# Patient Record
Sex: Female | Born: 1935 | Race: White | Hispanic: No | Smoking: Former smoker
Health system: Southern US, Community
[De-identification: ages and names within clinical notes are randomized; demographics above are authoritative.]

## PROBLEM LIST (undated history)

## (undated) DIAGNOSIS — I5189 Other ill-defined heart diseases: Secondary | ICD-10-CM

## (undated) DIAGNOSIS — M48061 Spinal stenosis, lumbar region without neurogenic claudication: Secondary | ICD-10-CM

## (undated) DIAGNOSIS — R42 Dizziness and giddiness: Secondary | ICD-10-CM

## (undated) DIAGNOSIS — I493 Ventricular premature depolarization: Secondary | ICD-10-CM

## (undated) DIAGNOSIS — D497 Neoplasm of unspecified behavior of endocrine glands and other parts of nervous system: Secondary | ICD-10-CM

## (undated) DIAGNOSIS — Z974 Presence of external hearing-aid: Secondary | ICD-10-CM

## (undated) DIAGNOSIS — K5792 Diverticulitis of intestine, part unspecified, without perforation or abscess without bleeding: Secondary | ICD-10-CM

## (undated) DIAGNOSIS — I498 Other specified cardiac arrhythmias: Secondary | ICD-10-CM

## (undated) DIAGNOSIS — N3020 Other chronic cystitis without hematuria: Secondary | ICD-10-CM

## (undated) DIAGNOSIS — M1990 Unspecified osteoarthritis, unspecified site: Secondary | ICD-10-CM

## (undated) DIAGNOSIS — E119 Type 2 diabetes mellitus without complications: Secondary | ICD-10-CM

## (undated) DIAGNOSIS — K625 Hemorrhage of anus and rectum: Secondary | ICD-10-CM

## (undated) DIAGNOSIS — S065XAA Traumatic subdural hemorrhage with loss of consciousness status unknown, initial encounter: Secondary | ICD-10-CM

## (undated) DIAGNOSIS — S065X9A Traumatic subdural hemorrhage with loss of consciousness of unspecified duration, initial encounter: Secondary | ICD-10-CM

## (undated) DIAGNOSIS — I739 Peripheral vascular disease, unspecified: Secondary | ICD-10-CM

## (undated) DIAGNOSIS — I639 Cerebral infarction, unspecified: Secondary | ICD-10-CM

## (undated) DIAGNOSIS — E785 Hyperlipidemia, unspecified: Secondary | ICD-10-CM

## (undated) DIAGNOSIS — I10 Essential (primary) hypertension: Secondary | ICD-10-CM

## (undated) DIAGNOSIS — E041 Nontoxic single thyroid nodule: Secondary | ICD-10-CM

## (undated) DIAGNOSIS — R55 Syncope and collapse: Secondary | ICD-10-CM

## (undated) DIAGNOSIS — E079 Disorder of thyroid, unspecified: Secondary | ICD-10-CM

## (undated) DIAGNOSIS — C4491 Basal cell carcinoma of skin, unspecified: Secondary | ICD-10-CM

## (undated) DIAGNOSIS — I471 Supraventricular tachycardia, unspecified: Secondary | ICD-10-CM

## (undated) DIAGNOSIS — M79606 Pain in leg, unspecified: Secondary | ICD-10-CM

## (undated) DIAGNOSIS — D510 Vitamin B12 deficiency anemia due to intrinsic factor deficiency: Secondary | ICD-10-CM

## (undated) HISTORY — DX: Vitamin B12 deficiency anemia due to intrinsic factor deficiency: D51.0

## (undated) HISTORY — DX: Cerebral infarction, unspecified: I63.9

## (undated) HISTORY — DX: Nontoxic single thyroid nodule: E04.1

## (undated) HISTORY — DX: Hemorrhage of anus and rectum: K62.5

## (undated) HISTORY — DX: Type 2 diabetes mellitus without complications: E11.9

## (undated) HISTORY — PX: TOTAL ABDOMINAL HYSTERECTOMY W/ BILATERAL SALPINGOOPHORECTOMY: SHX83

## (undated) HISTORY — DX: Pain in leg, unspecified: M79.606

## (undated) HISTORY — DX: Peripheral vascular disease, unspecified: I73.9

## (undated) HISTORY — PX: OTHER SURGICAL HISTORY: SHX169

## (undated) HISTORY — DX: Hyperlipidemia, unspecified: E78.5

## (undated) HISTORY — DX: Diverticulitis of intestine, part unspecified, without perforation or abscess without bleeding: K57.92

## (undated) HISTORY — PX: CYSTOSCOPY: SUR368

## (undated) HISTORY — DX: Traumatic subdural hemorrhage with loss of consciousness status unknown, initial encounter: S06.5XAA

## (undated) HISTORY — DX: Neoplasm of unspecified behavior of endocrine glands and other parts of nervous system: D49.7

## (undated) HISTORY — DX: Basal cell carcinoma of skin, unspecified: C44.91

## (undated) HISTORY — DX: Traumatic subdural hemorrhage with loss of consciousness of unspecified duration, initial encounter: S06.5X9A

## (undated) HISTORY — DX: Dizziness and giddiness: R42

## (undated) HISTORY — PX: SKIN SURGERY: SHX2413

## (undated) HISTORY — PX: BREAST CYST EXCISION: SHX579

## (undated) HISTORY — DX: Syncope and collapse: R55

## (undated) HISTORY — PX: BILATERAL SALPINGOOPHORECTOMY: SHX1223

## (undated) HISTORY — DX: Unspecified osteoarthritis, unspecified site: M19.90

## (undated) HISTORY — DX: Disorder of thyroid, unspecified: E07.9

## (undated) HISTORY — DX: Essential (primary) hypertension: I10

## (undated) HISTORY — DX: Other chronic cystitis without hematuria: N30.20

## (undated) HISTORY — DX: Spinal stenosis, lumbar region without neurogenic claudication: M48.061

---

## 2005-01-24 ENCOUNTER — Ambulatory Visit: Payer: Self-pay | Admitting: Internal Medicine

## 2005-02-06 ENCOUNTER — Ambulatory Visit: Payer: Self-pay | Admitting: Internal Medicine

## 2005-03-17 ENCOUNTER — Ambulatory Visit: Payer: Self-pay | Admitting: Urology

## 2006-01-31 ENCOUNTER — Ambulatory Visit: Payer: Self-pay | Admitting: Internal Medicine

## 2006-12-28 ENCOUNTER — Ambulatory Visit: Payer: Self-pay | Admitting: Internal Medicine

## 2007-03-29 ENCOUNTER — Ambulatory Visit: Payer: Self-pay | Admitting: Internal Medicine

## 2007-04-30 ENCOUNTER — Ambulatory Visit: Payer: Self-pay

## 2007-07-01 ENCOUNTER — Ambulatory Visit: Payer: Self-pay | Admitting: Internal Medicine

## 2008-02-25 ENCOUNTER — Ambulatory Visit: Payer: Self-pay | Admitting: Internal Medicine

## 2009-01-26 ENCOUNTER — Ambulatory Visit: Payer: Self-pay | Admitting: Gastroenterology

## 2009-01-26 HISTORY — PX: COLONOSCOPY: SHX174

## 2009-02-26 ENCOUNTER — Ambulatory Visit: Payer: Self-pay | Admitting: Internal Medicine

## 2009-04-30 ENCOUNTER — Ambulatory Visit: Payer: Self-pay | Admitting: Gastroenterology

## 2009-09-23 ENCOUNTER — Encounter: Payer: Self-pay | Admitting: Internal Medicine

## 2009-10-18 ENCOUNTER — Emergency Department: Payer: Self-pay | Admitting: Emergency Medicine

## 2009-11-02 ENCOUNTER — Encounter: Payer: Self-pay | Admitting: Internal Medicine

## 2009-11-08 ENCOUNTER — Observation Stay: Payer: Self-pay | Admitting: Internal Medicine

## 2009-11-16 ENCOUNTER — Ambulatory Visit: Payer: Self-pay | Admitting: Internal Medicine

## 2009-11-17 ENCOUNTER — Encounter: Payer: Self-pay | Admitting: Internal Medicine

## 2009-11-23 ENCOUNTER — Ambulatory Visit: Payer: Self-pay | Admitting: Internal Medicine

## 2010-03-23 ENCOUNTER — Ambulatory Visit: Payer: Self-pay | Admitting: Internal Medicine

## 2010-10-18 ENCOUNTER — Encounter: Payer: Self-pay | Admitting: Otolaryngology

## 2010-11-16 ENCOUNTER — Encounter: Payer: Self-pay | Admitting: Otolaryngology

## 2010-12-14 ENCOUNTER — Ambulatory Visit: Payer: Self-pay | Admitting: Internal Medicine

## 2010-12-17 ENCOUNTER — Encounter: Payer: Self-pay | Admitting: Otolaryngology

## 2010-12-26 ENCOUNTER — Ambulatory Visit: Payer: Self-pay | Admitting: Internal Medicine

## 2011-01-16 ENCOUNTER — Encounter: Payer: Self-pay | Admitting: Otolaryngology

## 2011-02-16 ENCOUNTER — Encounter: Payer: Self-pay | Admitting: Otolaryngology

## 2011-02-23 ENCOUNTER — Ambulatory Visit: Payer: Self-pay | Admitting: Otolaryngology

## 2011-03-18 ENCOUNTER — Encounter: Payer: Self-pay | Admitting: Otolaryngology

## 2011-04-18 DIAGNOSIS — R55 Syncope and collapse: Secondary | ICD-10-CM

## 2011-04-18 HISTORY — DX: Syncope and collapse: R55

## 2011-04-24 ENCOUNTER — Ambulatory Visit: Payer: Self-pay | Admitting: Internal Medicine

## 2011-04-29 ENCOUNTER — Emergency Department: Payer: Self-pay | Admitting: Internal Medicine

## 2011-04-29 LAB — COMPREHENSIVE METABOLIC PANEL
Albumin: 3.3 g/dL — ABNORMAL LOW (ref 3.4–5.0)
Anion Gap: 7 (ref 7–16)
BUN: 12 mg/dL (ref 7–18)
Bilirubin,Total: 0.5 mg/dL (ref 0.2–1.0)
Chloride: 103 mmol/L (ref 98–107)
Co2: 29 mmol/L (ref 21–32)
Creatinine: 0.56 mg/dL — ABNORMAL LOW (ref 0.60–1.30)
Potassium: 3.9 mmol/L (ref 3.5–5.1)
SGOT(AST): 14 U/L — ABNORMAL LOW (ref 15–37)
Sodium: 139 mmol/L (ref 136–145)
Total Protein: 6.3 g/dL — ABNORMAL LOW (ref 6.4–8.2)

## 2011-04-29 LAB — CBC
HGB: 14 g/dL (ref 12.0–16.0)
MCH: 30 pg (ref 26.0–34.0)
MCV: 87 fL (ref 80–100)
RBC: 4.67 10*6/uL (ref 3.80–5.20)
RDW: 13.6 % (ref 11.5–14.5)

## 2011-04-29 LAB — TROPONIN I: Troponin-I: 0.03 ng/mL

## 2011-08-25 ENCOUNTER — Emergency Department: Payer: Self-pay | Admitting: Emergency Medicine

## 2011-08-25 LAB — COMPREHENSIVE METABOLIC PANEL
Alkaline Phosphatase: 65 U/L (ref 50–136)
Anion Gap: 11 (ref 7–16)
BUN: 14 mg/dL (ref 7–18)
Bilirubin,Total: 0.3 mg/dL (ref 0.2–1.0)
Calcium, Total: 8.3 mg/dL — ABNORMAL LOW (ref 8.5–10.1)
Chloride: 109 mmol/L — ABNORMAL HIGH (ref 98–107)
Co2: 21 mmol/L (ref 21–32)
Creatinine: 0.63 mg/dL (ref 0.60–1.30)
EGFR (African American): >60
Glucose: 142 mg/dL — ABNORMAL HIGH (ref 65–99)
Osmolality: 284 (ref 275–301)
Potassium: 3.8 mmol/L (ref 3.5–5.1)
SGOT(AST): 18 U/L (ref 15–37)
Sodium: 141 mmol/L (ref 136–145)
Total Protein: 6.7 g/dL (ref 6.4–8.2)

## 2011-08-25 LAB — ETHANOL
Ethanol %: 0.029 % (ref 0.000–0.080)
Ethanol: 29 mg/dL

## 2011-08-25 LAB — CBC
HCT: 43.3 % (ref 35.0–47.0)
MCH: 29.4 pg (ref 26.0–34.0)
MCHC: 33.4 g/dL (ref 32.0–36.0)
MCV: 88 fL (ref 80–100)
RDW: 13.9 % (ref 11.5–14.5)

## 2011-08-25 LAB — TROPONIN I: Troponin-I: <0.02 ng/mL

## 2011-08-25 LAB — CK TOTAL AND CKMB (NOT AT ARMC)
CK, Total: 35 U/L (ref 21–215)
CK-MB: 0.8 ng/mL (ref 0.5–3.6)

## 2011-11-29 ENCOUNTER — Encounter: Payer: Self-pay | Admitting: Cardiovascular Disease

## 2011-12-04 ENCOUNTER — Ambulatory Visit (INDEPENDENT_AMBULATORY_CARE_PROVIDER_SITE_OTHER): Payer: Medicare Other | Admitting: Cardiovascular Disease

## 2011-12-04 ENCOUNTER — Encounter: Payer: Self-pay | Admitting: Cardiovascular Disease

## 2011-12-04 VITALS — BP 127/70 | HR 74 | Ht 61.0 in | Wt 125.5 lb

## 2011-12-04 DIAGNOSIS — E119 Type 2 diabetes mellitus without complications: Secondary | ICD-10-CM

## 2011-12-04 DIAGNOSIS — I10 Essential (primary) hypertension: Secondary | ICD-10-CM

## 2011-12-04 DIAGNOSIS — R55 Syncope and collapse: Secondary | ICD-10-CM

## 2011-12-04 DIAGNOSIS — Z87891 Personal history of nicotine dependence: Secondary | ICD-10-CM

## 2011-12-04 DIAGNOSIS — E785 Hyperlipidemia, unspecified: Secondary | ICD-10-CM

## 2011-12-04 DIAGNOSIS — Z87898 Personal history of other specified conditions: Secondary | ICD-10-CM

## 2011-12-04 NOTE — Assessment & Plan Note (Signed)
Prior smoking history. Quit 20 years ago after smoking for 25 years

## 2011-12-04 NOTE — Assessment & Plan Note (Signed)
Followed by primary care.   

## 2011-12-04 NOTE — Patient Instructions (Addendum)
You are doing well. Please cut the losartan in 1/2 and take at night  Try RED yeast Rice for cholesterol  Please call us if you have new issues that need to be addressed before your next appt.  Your physician wants you to follow-up in: 6 months.  You will receive a reminder letter in the mail two months in advance. If you don't receive a letter, please call our office to schedule the follow-up appointment.

## 2011-12-04 NOTE — Assessment & Plan Note (Signed)
She is worried about her blood pressure dropping in the afternoon. She takes all 3 of her blood pressure medications in the morning. We have suggested she cut her losartan to 50 mg daily and take this in the evening and closely monitor her blood pressure.

## 2011-12-04 NOTE — Assessment & Plan Note (Signed)
No recent dizziness in the past several months. Notes indicate prior dizzy episodes, possible vertigo

## 2011-12-04 NOTE — Assessment & Plan Note (Signed)
She does not want a statin. We have suggested red yeast rice

## 2011-12-04 NOTE — Assessment & Plan Note (Signed)
Prior syncopal episode with workup at [facility]. Etiology uncertain, possible vasovagal. Repeat vasovagal episode while in rehabilitation while on she was on the bathroom commode. Unable to exclude arrhythmia as a cause of her first episode. Holter done in the past. We'll try to obtain these results prior systems. She does not want additional workup at this time. If she has additional episodes, a 30 day monitor could be performed.

## 2011-12-04 NOTE — Progress Notes (Signed)
Patient ID: Claire Washington, female    DOB: Jul 08, 1924, 78 y.o.   MRN: [id number]  HPI Comments: Claire Washington  is a very pleasant 78 year old woman with history of hypertension, chronic dizziness, benign positional vertigo, osteoarthritis, hyperlipidemia, carotid arterial disease, chronic cystitis, diabetes, who presents for new patient evaluation. Remote history of smoking until age 78  She reports that she has had syncope 08/25/2011. She was in her kitchen preparing dinner and she had acute loss of consciousness, hit her head, left temple on the refrigerator. She had a subarachnoid hemorrhage noted on evaluation at [facility], transferred to [facility] for further evaluation. That evening, she reports having a cocktail, milk of magnesia with constipation, also with high blood pressure at the time, it was not unusual for her blood pressures to run systolics close to 180.    She had workup at [facility] but she does not know the details. She was discharged to rehabilitation. While at rehabilitation, she was sitting on a bathroom commode, with constipation, had taken senna, she had lightheadedness and passed out for a brief period. She completed rehabilitation and has been home for 2 and half months. She denies any lightheadedness, dizziness near syncope or syncope at home in the past 2 months.  She reports that she does not want a cholesterol medication despite some carotid arterial disease. She states having Holter monitor and stress test several years ago at [place]. He does report having low blood pressures sometimes during the daytime. She is fatigued in the afternoon when her 3 blood pressure medications taken in the morning seem to start working. She feels it is getting too low.  EKG shows normal sinus rhythm with rate 65 beats per minute with no significant ST or T wave changes      Outpatient Encounter Prescriptions as of 12/04/2011  Medication Sig Dispense Refill  . amLODipine (NORVASC) 10 MG tablet Take 10 mg by  mouth daily.      Marland Kitchen aspirin 81 MG tablet Take 81 mg by mouth daily.      Marland Kitchen docusate sodium (COLACE) 100 MG capsule Take 100 mg by mouth 2 (two) times daily.      Marland Kitchen losartan (COZAAR) 100 MG tablet Take 100 mg by mouth daily.      . metoprolol succinate (TOPROL-XL) 50 MG 24 hr tablet Take 50 mg by mouth daily. Take with or immediately following a meal.        Review of Systems  Constitutional: Negative.   HENT: Negative.   Eyes: Negative.   Respiratory: Negative.   Cardiovascular: Negative.   Gastrointestinal: Negative.   Musculoskeletal: Positive for gait problem.  Skin: Negative.   Neurological: Negative.   Hematological: Negative.   Psychiatric/Behavioral: Negative.   All other systems reviewed and are negative.    BP 127/70  Pulse 74  Ht 5\' 1"  (1.549 m)  Wt 125 lb 8 oz (56.926 kg)  BMI 23.71 kg/m2  Physical Exam  Nursing note and vitals reviewed. Constitutional: She is oriented to person, place, and time. She appears well-developed and well-nourished.  HENT:  Head: Normocephalic.  Nose: Nose normal.  Mouth/Throat: Oropharynx is clear and moist.  Eyes: Conjunctivae are normal. Pupils are equal, round, and reactive to light.  Neck: Normal range of motion. Neck supple. No JVD present.  Cardiovascular: Normal rate, regular rhythm, S1 normal, S2 normal, normal heart sounds and intact distal pulses.  Exam reveals no gallop and no friction rub.   No murmur heard. Pulmonary/Chest: Effort normal and breath sounds  normal. No respiratory distress. She has no wheezes. She has no rales. She exhibits no tenderness.  Abdominal: Soft. Bowel sounds are normal. She exhibits no distension. There is no tenderness.  Musculoskeletal: Normal range of motion. She exhibits no edema and no tenderness.  Lymphadenopathy:    She has no cervical adenopathy.  Neurological: She is alert and oriented to person, place, and time. Coordination normal.  Skin: Skin is warm and dry. No rash noted. No  erythema.  Psychiatric: She has a normal mood and affect. Her behavior is normal. Judgment and thought content normal.         Assessment and Plan

## 2012-02-12 ENCOUNTER — Encounter: Payer: Self-pay | Admitting: Otolaryngology

## 2012-02-16 ENCOUNTER — Encounter: Payer: Self-pay | Admitting: Otolaryngology

## 2012-03-17 ENCOUNTER — Encounter: Payer: Self-pay | Admitting: Otolaryngology

## 2012-04-17 ENCOUNTER — Encounter: Payer: Self-pay | Admitting: Otolaryngology

## 2012-05-01 ENCOUNTER — Ambulatory Visit: Payer: Self-pay | Admitting: Internal Medicine

## 2012-06-19 ENCOUNTER — Ambulatory Visit: Payer: Self-pay | Admitting: Internal Medicine

## 2012-07-04 ENCOUNTER — Telehealth: Payer: Self-pay

## 2012-07-04 NOTE — Telephone Encounter (Signed)
Pt reports elevated HRs over the last few days She is concerned, although not very symptomatic Says normal HR=60's She is now getting HR=80-90's She was instructed by PCP to increase Toprol to 50 mg in am, 25 mg in pm  for elevated BP Says BP better but HRs are increasing Denies sob, edema, cp or dizziness Due for 6 month check up Scheduled for 3/28 at 1115 Will come in tomm am for EKG to make sure she is in NSR

## 2012-07-04 NOTE — Telephone Encounter (Signed)
Pt states pulse is 92 last night, this morning 81. Pt would like a nurse to call.

## 2012-07-05 ENCOUNTER — Ambulatory Visit (INDEPENDENT_AMBULATORY_CARE_PROVIDER_SITE_OTHER): Payer: Medicare Other

## 2012-07-05 VITALS — BP 120/62 | HR 65 | Ht 61.0 in | Wt 130.5 lb

## 2012-07-05 DIAGNOSIS — I499 Cardiac arrhythmia, unspecified: Secondary | ICD-10-CM

## 2012-07-05 DIAGNOSIS — I10 Essential (primary) hypertension: Secondary | ICD-10-CM

## 2012-07-05 NOTE — Patient Instructions (Signed)
Continue medications as you have been Keep appt as scheduled

## 2012-07-05 NOTE — Progress Notes (Signed)
Pt here for EKG per yesterday's conversation Was c/o tachycardia Today's HR=65 BPM and shows NSR I gave her reassurance Says her HR generally goes up at hs I explained, unless she becomes symptomatic (sob, dizziness, CP), HR=80-90's BPM can be normal She asks if ok to take an extra 1/2 tablet toprol at hs I advised against this if BP </=130SBP Understanding verb She will keep appt as scheduled with [place] 3/28

## 2012-07-09 ENCOUNTER — Ambulatory Visit: Payer: Self-pay | Admitting: Otolaryngology

## 2012-07-09 ENCOUNTER — Ambulatory Visit: Payer: Self-pay | Admitting: Podiatry

## 2012-07-12 ENCOUNTER — Ambulatory Visit (INDEPENDENT_AMBULATORY_CARE_PROVIDER_SITE_OTHER): Payer: Medicare Other | Admitting: Cardiovascular Disease

## 2012-07-12 ENCOUNTER — Encounter: Payer: Self-pay | Admitting: Cardiovascular Disease

## 2012-07-12 VITALS — BP 122/62 | HR 64 | Ht 61.0 in | Wt 133.2 lb

## 2012-07-12 DIAGNOSIS — R55 Syncope and collapse: Secondary | ICD-10-CM

## 2012-07-12 DIAGNOSIS — I10 Essential (primary) hypertension: Secondary | ICD-10-CM

## 2012-07-12 DIAGNOSIS — E119 Type 2 diabetes mellitus without complications: Secondary | ICD-10-CM

## 2012-07-12 DIAGNOSIS — E785 Hyperlipidemia, unspecified: Secondary | ICD-10-CM

## 2012-07-12 NOTE — Assessment & Plan Note (Signed)
No further syncope or near syncope. Likely vasovagal

## 2012-07-12 NOTE — Progress Notes (Signed)
Patient ID: Claire Washington, female    DOB: 12/12/1924, 79 y.o.   MRN: [id number]  HPI Comments: [name]  is a very pleasant 79 year old woman with history of hypertension, chronic dizziness, benign positional vertigo, osteoarthritis, hyperlipidemia, carotid arterial disease, chronic cystitis, diabetes, who presents for routine followup.  Remote history of smoking until age 20 Prior history of poorly controlled blood pressure Episodes of syncope in the setting of GI issues/constipation Less than 49% bilateral carotid arterial disease Sulfa allergy   syncope 08/25/2011. She was in her kitchen preparing dinner and she had acute loss of consciousness, hit her head, left temple on the refrigerator. She had a subarachnoid hemorrhage noted on evaluation at [facility], transferred to [facility] for further evaluation. That evening, she reports having a cocktail, milk of magnesia with constipation. She was discharged to rehabilitation. While at rehabilitation, she was sitting on a bathroom commode, with constipation, had taken senna, she had lightheadedness and passed out for a brief period. She completed rehabilitation and has been home for 2 and half months.   She denies any lightheadedness, dizziness near syncope or syncope at home in the past 2 months. She recently added metoprolol 25 mg to her p.m. Medication regimen with improved heart rate and blood pressure. Some mild leg edema, stable She reports that she does not want a cholesterol medication She has been monitoring her blood pressure and heart rate closely. Since adding metoprolol in the evening, blood pressure typically in the 130-140 range, heart rate 70s  She states having Holter monitor and stress test several years ago at [place].  Recent lab work shows total cholesterol 270, LDL 177, HDL 60 EKG shows normal sinus rhythm with rate 57 beats per minute with no significant ST or T wave changes    Outpatient Encounter Prescriptions as of 07/12/2012   Medication Sig Dispense Refill  . amLODipine (NORVASC) 10 MG tablet Take 10 mg by mouth daily.      Marland Kitchen aspirin 81 MG tablet Take 81 mg by mouth daily.      Marland Kitchen losartan (COZAAR) 100 MG tablet Take 100 mg by mouth daily.      . metoprolol succinate (TOPROL-XL) 50 MG 24 hr tablet Take 50 mg in the am and 25 mg in the pm. Take with or immediately following a meal.      . [DISCONTINUED] docusate sodium (COLACE) 100 MG capsule Take 100 mg by mouth 2 (two) times daily.       No facility-administered encounter medications on file as of 07/12/2012.     Review of Systems  Constitutional: Negative.   HENT: Negative.   Eyes: Negative.   Respiratory: Negative.   Cardiovascular: Negative.   Gastrointestinal: Negative.   Musculoskeletal: Positive for gait problem.  Skin: Negative.   Neurological: Negative.   Psychiatric/Behavioral: Negative.   All other systems reviewed and are negative.    BP 122/62  Pulse 64  Ht 5\' 1"  (1.549 m)  Wt 133 lb 4 oz (60.442 kg)  BMI 25.19 kg/m2  Physical Exam  Nursing note and vitals reviewed. Constitutional: She is oriented to person, place, and time. She appears well-developed and well-nourished.  HENT:  Head: Normocephalic.  Nose: Nose normal.  Mouth/Throat: Oropharynx is clear and moist.  Eyes: Conjunctivae are normal. Pupils are equal, round, and reactive to light.  Neck: Normal range of motion. Neck supple. No JVD present.  Cardiovascular: Normal rate, regular rhythm, S1 normal, S2 normal, normal heart sounds and intact distal pulses.  Exam reveals  no gallop and no friction rub.   No murmur heard. Pulmonary/Chest: Effort normal and breath sounds normal. No respiratory distress. She has no wheezes. She has no rales. She exhibits no tenderness.  Abdominal: Soft. Bowel sounds are normal. She exhibits no distension. There is no tenderness.  Musculoskeletal: Normal range of motion. She exhibits no edema and no tenderness.  Lymphadenopathy:    She has no  cervical adenopathy.  Neurological: She is alert and oriented to person, place, and time. Coordination normal.  Skin: Skin is warm and dry. No rash noted. No erythema.  Psychiatric: She has a normal mood and affect. Her behavior is normal. Judgment and thought content normal.    Assessment and Plan

## 2012-07-12 NOTE — Assessment & Plan Note (Signed)
Blood pressure is well controlled on today's visit. No changes made to the medications. 

## 2012-07-12 NOTE — Assessment & Plan Note (Addendum)
She's not interested in a cholesterol medication. She will retry red yeast rice, increase her fiber intake

## 2012-07-12 NOTE — Assessment & Plan Note (Signed)
We have encouraged continued exercise, careful diet management in an effort to lose weight. 

## 2012-07-12 NOTE — Patient Instructions (Addendum)
You are doing well. No medication changes were made.  Please call us if you have new issues that need to be addressed before your next appt.  Your physician wants you to follow-up in: 6 months.  You will receive a reminder letter in the mail two months in advance. If you don't receive a letter, please call our office to schedule the follow-up appointment.   

## 2012-07-16 NOTE — Telephone Encounter (Signed)
Pt says she spoke with Dr. Mariah Milling about LE edema last OV  Says the edema had improved when here for visit but was told by Dr. Mariah Milling to call us should this return and he would "change some medications" She says she has had LE edema all day and asks if her medications need to be changed She is taking amlodipine 10 mg qd. I advised to try decreasing this to 5 mg qd but she says she tried this in past with Dr. Marcello Fennel and her BP was intolerable to the change She wonders if her medications need to be adjusted completely Says her BP has been "good" I told her I would discuss with Dr. Mariah Milling and call her back Understanding verb

## 2012-07-16 NOTE — Telephone Encounter (Signed)
Pt calling stating that she is having some increased leg swelling and wants to know if its coming from her meds

## 2012-07-16 NOTE — Telephone Encounter (Signed)
Amlodipine can cause lower extremity edema If she is reluctant to change his medication, she could try HCTZ sparingly when she has significant edema Perhaps take HCTZ one or 2 times per week only Stay on her medications as she is currently taking

## 2012-07-17 ENCOUNTER — Other Ambulatory Visit: Payer: Self-pay

## 2012-07-17 MED ORDER — HYDROCHLOROTHIAZIDE 12.5 MG PO CAPS: 12.5000 mg | ORAL_CAPSULE | ORAL | Status: DC | PRN

## 2012-07-17 NOTE — Telephone Encounter (Signed)
Pt informed Understanding verb RX sent to pharmacy 

## 2012-10-03 ENCOUNTER — Telehealth: Payer: Self-pay

## 2012-10-03 NOTE — Telephone Encounter (Signed)
Schedule for this month

## 2012-10-03 NOTE — Telephone Encounter (Signed)
Pt received a recall letter for September, but states she is having "eratic BP", states today her BP was 101/47, yesterday it was 113/45. Not sure if she needs to make sooner appt, or just talk w you. Please advise

## 2012-10-08 ENCOUNTER — Ambulatory Visit (INDEPENDENT_AMBULATORY_CARE_PROVIDER_SITE_OTHER): Payer: Medicare Other | Admitting: Cardiovascular Disease

## 2012-10-08 ENCOUNTER — Encounter: Payer: Self-pay | Admitting: Cardiovascular Disease

## 2012-10-08 VITALS — BP 120/50 | HR 78 | Ht 61.0 in | Wt 127.2 lb

## 2012-10-08 DIAGNOSIS — I10 Essential (primary) hypertension: Secondary | ICD-10-CM

## 2012-10-08 DIAGNOSIS — R55 Syncope and collapse: Secondary | ICD-10-CM

## 2012-10-08 DIAGNOSIS — R42 Dizziness and giddiness: Secondary | ICD-10-CM

## 2012-10-08 DIAGNOSIS — E119 Type 2 diabetes mellitus without complications: Secondary | ICD-10-CM

## 2012-10-08 NOTE — Patient Instructions (Addendum)
You are doing well. No medication changes were made.  Please call us if you have new issues that need to be addressed before your next appt.  Your physician wants you to follow-up in: 6 months.  You will receive a reminder letter in the mail two months in advance. If you don't receive a letter, please call our office to schedule the follow-up appointment.   

## 2012-10-08 NOTE — Assessment & Plan Note (Signed)
No recent episodes of syncope or near syncope 

## 2012-10-08 NOTE — Assessment & Plan Note (Signed)
Blood pressure is well controlled on today's visit. No changes made to the medications. Have asked her to call our office if she continues to have very low systolic pressures. These are very rare. She will check the other arm when she has these low numbers.

## 2012-10-08 NOTE — Progress Notes (Signed)
Patient ID: Claire Washington, female    DOB: 1924/06/27, 79 y.o.   MRN: [id number]  HPI Comments: Claire Washington  is a very pleasant 79 year old woman with history of hypertension, chronic dizziness, benign positional vertigo, osteoarthritis, hyperlipidemia, carotid arterial disease, chronic cystitis, diabetes, who presents for routine followup.  Remote history of smoking until age 49 Prior history of poorly controlled blood pressure Episodes of syncope in the setting of GI issues/constipation Less than 49% bilateral carotid arterial disease Sulfa allergy   syncope 08/25/2011. She was in her kitchen preparing dinner and she had acute loss of consciousness, hit her head, left temple on the refrigerator. She had a subarachnoid hemorrhage noted on evaluation at [facility], transferred to [facility] for further evaluation. That evening, she reports having a cocktail, milk of magnesia with constipation. She was discharged to rehabilitation. While at rehabilitation, she was sitting on a bathroom commode, with constipation, had taken senna, she had lightheadedness and passed out for a brief period. She completed rehabilitation and has been home for 2 and half months.   She denies any lightheadedness, dizziness near syncope or syncope  Her blood pressure has been well-controlled. She's not taking the metoprolol in the evening. She has been concerned as very rarely, she has very low systolic pressure recorded at 409 and 103. These are very rare, most of her blood pressure is in the 120-130 range. She is very pleased with most of her blood pressure measurements.  She states having Holter monitor and stress test several years ago at [place].   total cholesterol 270, LDL 177, HDL 60 EKG shows normal sinus rhythm with rate 78 beats per minute with no significant ST or T wave changes    Outpatient Encounter Prescriptions as of 10/08/2012  Medication Sig Dispense Refill  . amLODipine (NORVASC) 10 MG tablet Take 10 mg by mouth  daily.      Marland Kitchen aspirin 81 MG tablet Take 81 mg by mouth daily.      . fluocinonide (LIDEX) 0.05 % external solution Apply 1 application topically daily.       . fluticasone (FLONASE) 50 MCG/ACT nasal spray Place 1 spray into the nose daily.       . hydrochlorothiazide (MICROZIDE) 12.5 MG capsule Take 1 capsule (12.5 mg total) by mouth as needed.  90 capsule  3  . losartan (COZAAR) 100 MG tablet Take 100 mg by mouth daily.      . metoprolol succinate (TOPROL-XL) 50 MG 24 hr tablet Take 50 mg in the am and 25 mg in the pm. Take with or immediately following a meal.       No facility-administered encounter medications on file as of 10/08/2012.     Review of Systems  Constitutional: Negative.   HENT: Negative.   Eyes: Negative.   Respiratory: Negative.   Cardiovascular: Negative.   Gastrointestinal: Negative.   Musculoskeletal: Positive for gait problem.  Skin: Negative.   Neurological: Negative.   Psychiatric/Behavioral: Negative.   All other systems reviewed and are negative.    BP 120/50  Pulse 78  Ht 5\' 1"  (1.549 m)  Wt 127 lb 4 oz (57.72 kg)  BMI 24.06 kg/m2  Physical Exam  Nursing note and vitals reviewed. Constitutional: She is oriented to person, place, and time. She appears well-developed and well-nourished.  HENT:  Head: Normocephalic.  Nose: Nose normal.  Mouth/Throat: Oropharynx is clear and moist.  Eyes: Conjunctivae are normal. Pupils are equal, round, and reactive to light.  Neck: Normal range of  motion. Neck supple. No JVD present.  Cardiovascular: Normal rate, regular rhythm, S1 normal, S2 normal, normal heart sounds and intact distal pulses.  Exam reveals no gallop and no friction rub.   No murmur heard. Pulmonary/Chest: Effort normal and breath sounds normal. No respiratory distress. She has no wheezes. She has no rales. She exhibits no tenderness.  Abdominal: Soft. Bowel sounds are normal. She exhibits no distension. There is no tenderness.   Musculoskeletal: Normal range of motion. She exhibits no edema and no tenderness.  Lymphadenopathy:    She has no cervical adenopathy.  Neurological: She is alert and oriented to person, place, and time. Coordination normal.  Skin: Skin is warm and dry. No rash noted. No erythema.  Psychiatric: She has a normal mood and affect. Her behavior is normal. Judgment and thought content normal.    Assessment and Plan

## 2012-10-08 NOTE — Assessment & Plan Note (Signed)
We have encouraged continued exercise, careful diet management in an effort to lose weight. 

## 2013-04-15 ENCOUNTER — Ambulatory Visit: Payer: Medicare Other | Admitting: Cardiovascular Disease

## 2013-04-24 ENCOUNTER — Encounter: Payer: Self-pay | Admitting: Cardiovascular Disease

## 2013-04-24 ENCOUNTER — Ambulatory Visit (INDEPENDENT_AMBULATORY_CARE_PROVIDER_SITE_OTHER): Payer: Medicare Other | Admitting: Cardiovascular Disease

## 2013-04-24 VITALS — BP 112/52 | HR 71 | Ht 61.0 in | Wt 130.0 lb

## 2013-04-24 DIAGNOSIS — Z87898 Personal history of other specified conditions: Secondary | ICD-10-CM

## 2013-04-24 DIAGNOSIS — R55 Syncope and collapse: Secondary | ICD-10-CM

## 2013-04-24 DIAGNOSIS — E785 Hyperlipidemia, unspecified: Secondary | ICD-10-CM

## 2013-04-24 DIAGNOSIS — I10 Essential (primary) hypertension: Secondary | ICD-10-CM

## 2013-04-24 DIAGNOSIS — E119 Type 2 diabetes mellitus without complications: Secondary | ICD-10-CM

## 2013-04-24 NOTE — Assessment & Plan Note (Signed)
She does not want a statin. Samples of zetia 10 mg were given to try. Could also start a fenofibrate. She does not want WelChol

## 2013-04-24 NOTE — Assessment & Plan Note (Addendum)
No further episodes of near syncope or syncope. Suspect previous episodes were secondary to vasovagal etiology

## 2013-04-24 NOTE — Patient Instructions (Signed)
You are doing well. No medication changes were made.  Consider taking : zetia 10 mg one a day for cholesterol Fenofibrate/tricor one a day for cholesterol  Please call us if you have new issues that need to be addressed before your next appt.  Your physician wants you to follow-up in: 12 months.  You will receive a reminder letter in the mail two months in advance. If you don't receive a letter, please call our office to schedule the follow-up appointment.

## 2013-04-24 NOTE — Assessment & Plan Note (Signed)
Blood pressure is well controlled on today's visit. No changes made to the medications. 

## 2013-04-24 NOTE — Progress Notes (Signed)
Patient ID: Claire Washington, female    DOB: 08-25-1924, 80 y.o.   MRN: [id number]  HPI Comments: Claire Washington  is a very pleasant 80 year old woman with history of hypertension, chronic dizziness, benign positional vertigo, osteoarthritis, hyperlipidemia, carotid arterial disease, chronic cystitis, diabetes, who presents for routine followup.  Remote history of smoking until age 68 Prior history of poorly controlled blood pressure Episodes of syncope in the setting of GI issues/constipation, vasovagal episodes Less than 49% bilateral carotid arterial disease Sulfa allergy   syncope 08/25/2011. She was in her kitchen preparing dinner and she had acute loss of consciousness, hit her head, left temple on the refrigerator. She had a subarachnoid hemorrhage noted on evaluation at [facility], transferred to [facility] for further evaluation.  On her second episode, she reports having a cocktail, milk of magnesia with constipation. She was discharged to rehabilitation. While at rehabilitation, she was sitting on a bathroom commode, with constipation, had taken senna, she had lightheadedness and passed out for a brief period.   On today's visit, She denies any lightheadedness, dizziness near syncope or syncope  Her blood pressure has been well-controlled. Otherwise active with no complaints.  Previous Holter monitor and stress test several years ago at [place].   total cholesterol 270, LDL 177, HDL 60, she does not want a statin EKG shows normal sinus rhythm with rate 71 beats per minute with no significant ST or T wave changes    Outpatient Encounter Prescriptions as of 04/24/2013  Medication Sig  . amLODipine (NORVASC) 10 MG tablet Take 10 mg by mouth daily.  Marland Kitchen aspirin 81 MG tablet Take 81 mg by mouth daily.  . hydrochlorothiazide (MICROZIDE) 12.5 MG capsule Take 1 capsule (12.5 mg total) by mouth as needed.  Marland Kitchen losartan (COZAAR) 100 MG tablet Take 100 mg by mouth daily.  . metoprolol succinate (TOPROL-XL) 50 MG 24  hr tablet Take 50 mg in the am and 25 mg in the pm. Take with or immediately following a meal.    Review of Systems  Constitutional: Negative.   HENT: Negative.   Eyes: Negative.   Respiratory: Negative.   Cardiovascular: Negative.   Gastrointestinal: Negative.   Endocrine: Negative.   Musculoskeletal: Positive for gait problem.  Skin: Negative.   Allergic/Immunologic: Negative.   Neurological: Negative.   Hematological: Negative.   Psychiatric/Behavioral: Negative.   All other systems reviewed and are negative.    BP 112/52  Pulse 71  Ht 5\' 1"  (1.549 m)  Wt 130 lb (58.968 kg)  BMI 24.58 kg/m2  Physical Exam  Nursing note and vitals reviewed. Constitutional: She is oriented to person, place, and time. She appears well-developed and well-nourished.  HENT:  Head: Normocephalic.  Nose: Nose normal.  Mouth/Throat: Oropharynx is clear and moist.  Eyes: Conjunctivae are normal. Pupils are equal, round, and reactive to light.  Neck: Normal range of motion. Neck supple. No JVD present.  Cardiovascular: Normal rate, regular rhythm, S1 normal, S2 normal, normal heart sounds and intact distal pulses.  Exam reveals no gallop and no friction rub.   No murmur heard. Pulmonary/Chest: Effort normal and breath sounds normal. No respiratory distress. She has no wheezes. She has no rales. She exhibits no tenderness.  Abdominal: Soft. Bowel sounds are normal. She exhibits no distension. There is no tenderness.  Musculoskeletal: Normal range of motion. She exhibits no edema and no tenderness.  Lymphadenopathy:    She has no cervical adenopathy.  Neurological: She is alert and oriented to person, place, and time.  Coordination normal.  Skin: Skin is warm and dry. No rash noted. No erythema.  Psychiatric: She has a normal mood and affect. Her behavior is normal. Judgment and thought content normal.    Assessment and Plan

## 2013-04-24 NOTE — Assessment & Plan Note (Signed)
We have encouraged continued exercise, careful diet management in an effort to lose weight. 

## 2013-05-05 ENCOUNTER — Ambulatory Visit: Payer: Self-pay | Admitting: Internal Medicine

## 2013-06-26 ENCOUNTER — Telehealth: Payer: Self-pay | Admitting: *Deleted

## 2013-06-26 ENCOUNTER — Other Ambulatory Visit: Payer: Self-pay | Admitting: *Deleted

## 2013-06-26 MED ORDER — EZETIMIBE 10 MG PO TABS: 10.0000 mg | ORAL_TABLET | Freq: Every day | ORAL | Status: DC

## 2013-06-26 NOTE — Telephone Encounter (Signed)
Requested Prescriptions   Signed Prescriptions Disp Refills  . ezetimibe (ZETIA) 10 MG tablet 30 tablet 3    Sig: Take 1 tablet (10 mg total) by mouth daily.    Authorizing Provider: GOLLAN, TIMOTHY J    Ordering User: Zada Haser C    

## 2013-06-26 NOTE — Telephone Encounter (Signed)
Rx has been printed awaiting signature from Dr. Rockey Situ.

## 2013-06-26 NOTE — Telephone Encounter (Signed)
Patient wants a written rx for Zetia 10mg . She is not sure which pharmacy she wants to use. Please call when ready

## 2013-06-26 NOTE — Telephone Encounter (Signed)
, °

## 2013-06-27 ENCOUNTER — Other Ambulatory Visit: Payer: Self-pay

## 2013-06-27 NOTE — Telephone Encounter (Signed)
Notified patient written Rx for Zetia is available to pick up.

## 2013-07-26 ENCOUNTER — Emergency Department: Payer: Self-pay | Admitting: Emergency Medicine

## 2013-07-26 LAB — COMPREHENSIVE METABOLIC PANEL
ALBUMIN: 3.6 g/dL (ref 3.4–5.0)
ALK PHOS: 79 U/L
ALT: 23 U/L (ref 12–78)
Anion Gap: 5 — ABNORMAL LOW (ref 7–16)
BUN: 21 mg/dL — ABNORMAL HIGH (ref 7–18)
Bilirubin,Total: 0.4 mg/dL (ref 0.2–1.0)
CHLORIDE: 107 mmol/L (ref 98–107)
CO2: 28 mmol/L (ref 21–32)
CREATININE: 0.95 mg/dL (ref 0.60–1.30)
Calcium, Total: 9.3 mg/dL (ref 8.5–10.1)
EGFR (African American): >60
GFR CALC NON AF AMER: 54 — AB
GLUCOSE: 118 mg/dL — AB (ref 65–99)
OSMOLALITY: 283 (ref 275–301)
Potassium: 4.4 mmol/L (ref 3.5–5.1)
SGOT(AST): 9 U/L — ABNORMAL LOW (ref 15–37)
Sodium: 140 mmol/L (ref 136–145)
Total Protein: 7.2 g/dL (ref 6.4–8.2)

## 2013-07-26 LAB — CBC
HCT: 47.7 % — AB (ref 35.0–47.0)
HGB: 15.2 g/dL (ref 12.0–16.0)
MCH: 28.2 pg (ref 26.0–34.0)
MCHC: 31.9 g/dL — ABNORMAL LOW (ref 32.0–36.0)
MCV: 89 fL (ref 80–100)
Platelet: 181 10*3/uL (ref 150–440)
RBC: 5.39 10*6/uL — ABNORMAL HIGH (ref 3.80–5.20)
RDW: 14.3 % (ref 11.5–14.5)
WBC: 6.5 10*3/uL (ref 3.6–11.0)

## 2013-07-30 LAB — WOUND CULTURE

## 2013-08-18 ENCOUNTER — Emergency Department: Payer: Self-pay | Admitting: Emergency Medicine

## 2013-09-15 DIAGNOSIS — D497 Neoplasm of unspecified behavior of endocrine glands and other parts of nervous system: Secondary | ICD-10-CM | POA: Insufficient documentation

## 2014-02-02 ENCOUNTER — Other Ambulatory Visit: Payer: Self-pay | Admitting: *Deleted

## 2014-02-02 MED ORDER — EZETIMIBE 10 MG PO TABS: 10.0000 mg | ORAL_TABLET | Freq: Every day | ORAL | Status: DC

## 2014-03-26 ENCOUNTER — Ambulatory Visit: Payer: Self-pay | Admitting: Internal Medicine

## 2014-04-27 ENCOUNTER — Encounter (INDEPENDENT_AMBULATORY_CARE_PROVIDER_SITE_OTHER): Payer: Self-pay

## 2014-04-27 ENCOUNTER — Ambulatory Visit (INDEPENDENT_AMBULATORY_CARE_PROVIDER_SITE_OTHER): Payer: Medicare Other | Admitting: Cardiovascular Disease

## 2014-04-27 ENCOUNTER — Encounter: Payer: Self-pay | Admitting: Cardiovascular Disease

## 2014-04-27 VITALS — BP 110/54 | HR 63 | Ht 61.0 in | Wt 126.8 lb

## 2014-04-27 DIAGNOSIS — R55 Syncope and collapse: Secondary | ICD-10-CM

## 2014-04-27 DIAGNOSIS — Z720 Tobacco use: Secondary | ICD-10-CM

## 2014-04-27 DIAGNOSIS — E785 Hyperlipidemia, unspecified: Secondary | ICD-10-CM

## 2014-04-27 DIAGNOSIS — I158 Other secondary hypertension: Secondary | ICD-10-CM

## 2014-04-27 DIAGNOSIS — Z87891 Personal history of nicotine dependence: Secondary | ICD-10-CM

## 2014-04-27 DIAGNOSIS — E119 Type 2 diabetes mellitus without complications: Secondary | ICD-10-CM

## 2014-04-27 MED ORDER — FENOFIBRATE 160 MG PO TABS: 160.0000 mg | ORAL_TABLET | Freq: Every day | ORAL | Status: DC

## 2014-04-27 NOTE — Assessment & Plan Note (Signed)
No recent episodes of syncope or near syncope. Talked about low blood pressure and the need for close monitoring

## 2014-04-27 NOTE — Progress Notes (Signed)
Patient ID: Claire Washington, female    DOB: February 12, 1925, 81 y.o.   MRN: [id number]  HPI Comments: Claire Washington  is a very pleasant 81 year old woman with history of hypertension, chronic dizziness, benign positional vertigo, osteoarthritis, hyperlipidemia, carotid arterial disease, chronic cystitis, diabetes, who presents for routine followup of her dizziness.  Remote history of smoking until age 46 Prior history of poorly controlled blood pressure Episodes of syncope in the setting of GI issues/constipation, vasovagal episodes Less than 49% bilateral carotid arterial disease Sulfa allergy  In follow-up today, she denies any near syncope or syncope episodes. She takes HCTZ only when necessary. Blood pressure stable on amlodipine 10 mg daily and Cozaar with metoprolol twice a day Blood pressure sometimes runs low but she is asymptomatic  She reports having pneumonia November 2015. She is still recovering. Not doing a regular exercise program  EKG on today's visit shows normal sinus rhythm with rate 63 bpm, no significant ST or T-wave changes  Other past medical history  syncope 08/25/2011. She was in her kitchen preparing dinner and she had acute loss of consciousness, hit her head, left temple on the refrigerator. She had a subarachnoid hemorrhage noted on evaluation at [facility], transferred to [facility] for further evaluation.  On her second episode, she reports having a cocktail, milk of magnesia with constipation. She was discharged to rehabilitation. While at rehabilitation, she was sitting on a bathroom commode, with constipation, had taken senna, she had lightheadedness and passed out for a brief period.   Previous Holter monitor and stress test several years ago at [place].  Previous total cholesterol 270, LDL 177, HDL 60, she does not want a statin   Allergies  Allergen Reactions  . Sulfa Antibiotics     Outpatient Encounter Prescriptions as of 04/27/2014  Medication Sig  . amLODipine  (NORVASC) 10 MG tablet Take 10 mg by mouth daily.  Marland Kitchen aspirin 81 MG tablet Take 81 mg by mouth daily.  Marland Kitchen ezetimibe (ZETIA) 10 MG tablet Take 1 tablet (10 mg total) by mouth daily.  . hydrochlorothiazide (MICROZIDE) 12.5 MG capsule Take 1 capsule (12.5 mg total) by mouth as needed.  Marland Kitchen losartan (COZAAR) 100 MG tablet Take 100 mg by mouth daily.  . metoprolol succinate (TOPROL-XL) 50 MG 24 hr tablet Take 50 mg in the am and 25 mg in the pm. Take with or immediately following a meal.  . Multiple Vitamins-Minerals (CENTRUM SILVER PO) Take by mouth daily.  . Multiple Vitamins-Minerals (PRESERVISION AREDS PO) Take by mouth daily.  . Probiotic Product (PROBIOTIC & ACIDOPHILUS EX ST PO) Take by mouth daily.    Past Medical History  Diagnosis Date  . Hypertension   . Osteoarthritis   . Hyperlipidemia   . Diabetes mellitus   . Chronic cystitis   . Small vessel disease   . Leg pain   . Cancer     basel cell  . Thyroid nodule   . Syncope and collapse 2013  . Vertigo   . Thyroid nodule     Past Surgical History  Procedure Laterality Date  . Cystoscopy    . Colonoscopy    . Total abdominal hysterectomy    . Bilateral salpingoophorectomy      Social History  reports that she has quit smoking. Her smoking use included Cigarettes. She has a 12.5 pack-year smoking history. She has never used smokeless tobacco. She reports that she does not drink alcohol or use illicit drugs.  Family History family history includes Heart  disease in her father.   Review of Systems  Constitutional: Negative.   Respiratory: Negative.   Cardiovascular: Negative.   Gastrointestinal: Negative.   Musculoskeletal: Positive for gait problem.  Neurological: Negative.   Hematological: Negative.   Psychiatric/Behavioral: Negative.   All other systems reviewed and are negative.   BP 110/54 mmHg  Pulse 63  Ht 5\' 1"  (1.549 m)  Wt 126 lb 12 oz (57.493 kg)  BMI 23.96 kg/m2  Physical Exam  Constitutional: She  is oriented to person, place, and time. She appears well-developed and well-nourished.  HENT:  Head: Normocephalic.  Nose: Nose normal.  Mouth/Throat: Oropharynx is clear and moist.  Eyes: Conjunctivae are normal. Pupils are equal, round, and reactive to light.  Neck: Normal range of motion. Neck supple. No JVD present.  Cardiovascular: Normal rate, regular rhythm, S1 normal, S2 normal, normal heart sounds and intact distal pulses.  Exam reveals no gallop and no friction rub.   No murmur heard. Pulmonary/Chest: Effort normal and breath sounds normal. No respiratory distress. She has no wheezes. She has no rales. She exhibits no tenderness.  Abdominal: Soft. Bowel sounds are normal. She exhibits no distension. There is no tenderness.  Musculoskeletal: Normal range of motion. She exhibits no edema or tenderness.  Lymphadenopathy:    She has no cervical adenopathy.  Neurological: She is alert and oriented to person, place, and time. Coordination normal.  Skin: Skin is warm and dry. No rash noted. No erythema.  Psychiatric: She has a normal mood and affect. Her behavior is normal. Judgment and thought content normal.    Assessment and Plan  Nursing note and vitals reviewed.

## 2014-04-27 NOTE — Patient Instructions (Signed)
You are doing well.  Please check the price of the fenofibrate Ok to take with zetia daily or every other day  Please call us if you have new issues that need to be addressed before your next appt.  Your physician wants you to follow-up in: 6 months.  You will receive a reminder letter in the mail two months in advance. If you don't receive a letter, please call our office to schedule the follow-up appointment.

## 2014-04-27 NOTE — Assessment & Plan Note (Signed)
We have encouraged continued exercise, careful diet management in an effort to lose weight. 

## 2014-04-27 NOTE — Assessment & Plan Note (Signed)
We have encouraged continued smoking cessation. She does not want any assistance with chantix.

## 2014-04-27 NOTE — Assessment & Plan Note (Signed)
Blood pressure well controlled on her current medication regimen. Suggested she monitor her blood pressure closely and if it continues to run low, that she cut her amlodipine down to 5 mg daily

## 2014-05-15 ENCOUNTER — Ambulatory Visit: Payer: Self-pay | Admitting: Internal Medicine

## 2014-05-25 ENCOUNTER — Ambulatory Visit: Payer: Self-pay | Admitting: Internal Medicine

## 2014-06-04 DIAGNOSIS — Z889 Allergy status to unspecified drugs, medicaments and biological substances status: Secondary | ICD-10-CM | POA: Insufficient documentation

## 2014-06-04 DIAGNOSIS — R911 Solitary pulmonary nodule: Secondary | ICD-10-CM | POA: Insufficient documentation

## 2014-06-05 ENCOUNTER — Ambulatory Visit: Payer: Self-pay | Admitting: Internal Medicine

## 2014-06-16 ENCOUNTER — Ambulatory Visit
Admit: 2014-06-16 | Disposition: A | Discharge status: Home / Self Care | Payer: Self-pay | Attending: Internal Medicine | Admitting: Internal Medicine

## 2014-06-16 ENCOUNTER — Ambulatory Visit: Payer: Self-pay | Admitting: Internal Medicine

## 2014-06-18 DIAGNOSIS — R948 Abnormal results of function studies of other organs and systems: Secondary | ICD-10-CM | POA: Insufficient documentation

## 2014-07-01 ENCOUNTER — Ambulatory Visit: Payer: Self-pay | Admitting: Gastroenterology

## 2014-07-02 ENCOUNTER — Ambulatory Visit: Payer: Self-pay | Admitting: Gastroenterology

## 2014-07-15 ENCOUNTER — Other Ambulatory Visit: Payer: Self-pay | Admitting: Gastroenterology

## 2014-07-15 DIAGNOSIS — K5730 Diverticulosis of large intestine without perforation or abscess without bleeding: Secondary | ICD-10-CM

## 2014-07-17 ENCOUNTER — Ambulatory Visit
Admit: 2014-07-17 | Disposition: A | Discharge status: Home / Self Care | Payer: Self-pay | Attending: Internal Medicine | Admitting: Internal Medicine

## 2014-07-29 ENCOUNTER — Ambulatory Visit
Admission: RE | Admit: 2014-07-29 | Discharge: 2014-07-29 | Disposition: A | Discharge status: Home / Self Care | Payer: Medicare Other | Source: Ambulatory Visit | Attending: Gastroenterology | Admitting: Gastroenterology

## 2014-07-29 DIAGNOSIS — K5730 Diverticulosis of large intestine without perforation or abscess without bleeding: Secondary | ICD-10-CM

## 2014-09-02 HISTORY — PX: OTHER SURGICAL HISTORY: SHX169

## 2014-10-26 ENCOUNTER — Ambulatory Visit (INDEPENDENT_AMBULATORY_CARE_PROVIDER_SITE_OTHER): Payer: Medicare Other | Admitting: Cardiovascular Disease

## 2014-10-26 ENCOUNTER — Encounter: Payer: Self-pay | Admitting: Cardiovascular Disease

## 2014-10-26 VITALS — BP 104/56 | HR 76 | Ht 61.0 in | Wt 128.2 lb

## 2014-10-26 DIAGNOSIS — E119 Type 2 diabetes mellitus without complications: Secondary | ICD-10-CM | POA: Diagnosis not present

## 2014-10-26 DIAGNOSIS — Z87891 Personal history of nicotine dependence: Secondary | ICD-10-CM

## 2014-10-26 DIAGNOSIS — E785 Hyperlipidemia, unspecified: Secondary | ICD-10-CM | POA: Diagnosis not present

## 2014-10-26 DIAGNOSIS — Z720 Tobacco use: Secondary | ICD-10-CM | POA: Diagnosis not present

## 2014-10-26 DIAGNOSIS — I158 Other secondary hypertension: Secondary | ICD-10-CM | POA: Diagnosis not present

## 2014-10-26 DIAGNOSIS — R55 Syncope and collapse: Secondary | ICD-10-CM

## 2014-10-26 MED ORDER — ROSUVASTATIN CALCIUM 10 MG PO TABS: 10.0000 mg | ORAL_TABLET | Freq: Every day | ORAL | Status: DC

## 2014-10-26 NOTE — Assessment & Plan Note (Addendum)
Blood pressure running low on today's visit. Even on recheck with systolic pressure in the high 90s Recommended she decrease the losartan and amlodipine down to one half pill every morning That would be losartan 50 mg every morning, amlodipine 5 mg every morning

## 2014-10-26 NOTE — Progress Notes (Signed)
Patient ID: Claire Washington, female    DOB: March 30, 1925, 81 y.o.   MRN: [id number]  HPI Comments: Claire Washington  is a very pleasant 81 year old woman with history of hypertension, chronic dizziness, benign positional vertigo, osteoarthritis, hyperlipidemia, carotid arterial disease, chronic cystitis, diabetes, who presents for routine followup of her dizziness.   Remote history of smoking until age 53 Prior history of poorly controlled blood pressure Episodes of syncope in the setting of GI issues/constipation, vasovagal episodes Less than 49% bilateral carotid arterial disease Sulfa allergy  In follow-up today, she reports that she is doing well. Blood pressure continues to run low at home On her last clinic visit we had recommended she decrease her amlodipine down to 5 mg daily. She has not done this and continues on 10 mg daily In general, she is asymptomatic Not doing a regular exercise program  EKG on today's visit shows normal sinus rhythm with rate 76 bpm, no significant ST or T-wave changes  Other past medical history  pneumonia November 2015.    syncope 08/25/2011. She was in her kitchen preparing dinner and she had acute loss of consciousness, hit her head, left temple on the refrigerator. She had a subarachnoid hemorrhage noted on evaluation at [facility], transferred to [facility] for further evaluation.  On her second episode, she reports having a cocktail, milk of magnesia with constipation. She was discharged to rehabilitation. While at rehabilitation, she was sitting on a bathroom commode, with constipation, had taken senna, she had lightheadedness and passed out for a brief period.   Previous Holter monitor and stress test several years ago at [place].  Previous total cholesterol 270, LDL 177, HDL 60, she does not want a statin   Allergies  Allergen Reactions  . Sulfa Antibiotics     Outpatient Encounter Prescriptions as of 10/26/2014  Medication Sig  . amLODipine (NORVASC) 10  MG tablet Take 10 mg by mouth daily.  Marland Kitchen aspirin 81 MG tablet Take 81 mg by mouth daily.  Marland Kitchen ezetimibe (ZETIA) 10 MG tablet Take 1 tablet (10 mg total) by mouth daily.  . fenofibrate 160 MG tablet Take 1 tablet (160 mg total) by mouth daily.  . hydrochlorothiazide (MICROZIDE) 12.5 MG capsule Take 1 capsule (12.5 mg total) by mouth as needed.  Marland Kitchen losartan (COZAAR) 100 MG tablet Take 100 mg by mouth daily.  . metoprolol succinate (TOPROL-XL) 50 MG 24 hr tablet Take 50 mg in the am and 25 mg in the pm. Take with or immediately following a meal.  . Multiple Vitamins-Minerals (CENTRUM SILVER PO) Take by mouth daily.  . Multiple Vitamins-Minerals (PRESERVISION AREDS PO) Take by mouth daily.  . Probiotic Product (PROBIOTIC & ACIDOPHILUS EX ST PO) Take by mouth daily.  . rosuvastatin (CRESTOR) 10 MG tablet Take 1 tablet (10 mg total) by mouth daily.   No facility-administered encounter medications on file as of 10/26/2014.    Past Medical History  Diagnosis Date  . Hypertension   . Osteoarthritis   . Hyperlipidemia   . Diabetes mellitus   . Chronic cystitis   . Small vessel disease   . Leg pain   . Cancer     basel cell  . Thyroid nodule   . Syncope and collapse 2013  . Vertigo   . Thyroid nodule     Past Surgical History  Procedure Laterality Date  . Cystoscopy    . Colonoscopy    . Total abdominal hysterectomy    . Bilateral salpingoophorectomy  Social History  reports that she has quit smoking. Her smoking use included Cigarettes. She has a 12.5 pack-year smoking history. She has never used smokeless tobacco. She reports that she does not drink alcohol or use illicit drugs.  Family History family history includes Heart disease in her father.   Review of Systems  Constitutional: Negative.   Respiratory: Negative.   Cardiovascular: Negative.   Gastrointestinal: Negative.   Musculoskeletal: Positive for gait problem.  Neurological: Negative.   Hematological: Negative.    Psychiatric/Behavioral: Negative.   All other systems reviewed and are negative.   BP 104/56 mmHg  Pulse 76  Ht 5\' 1"  (1.549 m)  Wt 128 lb 4 oz (58.174 kg)  BMI 24.25 kg/m2  Physical Exam  Constitutional: She is oriented to person, place, and time. She appears well-developed and well-nourished.  HENT:  Head: Normocephalic.  Nose: Nose normal.  Mouth/Throat: Oropharynx is clear and moist.  Eyes: Conjunctivae are normal. Pupils are equal, round, and reactive to light.  Neck: Normal range of motion. Neck supple. No JVD present.  Cardiovascular: Normal rate, regular rhythm, S1 normal, S2 normal, normal heart sounds and intact distal pulses.  Exam reveals no gallop and no friction rub.   No murmur heard. Pulmonary/Chest: Effort normal and breath sounds normal. No respiratory distress. She has no wheezes. She has no rales. She exhibits no tenderness.  Abdominal: Soft. Bowel sounds are normal. She exhibits no distension. There is no tenderness.  Musculoskeletal: Normal range of motion. She exhibits no edema or tenderness.  Lymphadenopathy:    She has no cervical adenopathy.  Neurological: She is alert and oriented to person, place, and time. Coordination normal.  Skin: Skin is warm and dry. No rash noted. No erythema.  Psychiatric: She has a normal mood and affect. Her behavior is normal. Judgment and thought content normal.    Assessment and Plan  Nursing note and vitals reviewed.

## 2014-10-26 NOTE — Assessment & Plan Note (Signed)
We have encouraged her to continue to work on weaning her cigarettes and smoking cessation. She will continue to work on this and does not want any assistance with chantix.  

## 2014-10-26 NOTE — Patient Instructions (Addendum)
You are doing well,  Please take 1/2 pill of the amlodipine and losartan If pulse is elevated in the evening, take extra 1/2 dose of metoprolol If blood pressure is elevated in the evening, take either 1/2 dose of losartan or amlodipine  Please start crestor 1/2 pill daily  Please call us if you have new issues that need to be addressed before your next appt.  Your physician wants you to follow-up in: 6 months.  You will receive a reminder letter in the mail two months in advance. If you don't receive a letter, please call our office to schedule the follow-up appointment.

## 2014-10-26 NOTE — Assessment & Plan Note (Signed)
We have encouraged continued exercise, careful diet management in an effort to lose weight. 

## 2014-10-26 NOTE — Assessment & Plan Note (Signed)
No recent episodes of syncope or near syncope. We'll decrease her blood pressure medication to avoid orthostatic hypotension

## 2014-10-26 NOTE — Assessment & Plan Note (Signed)
She is willing to reconsider a statin. Total cholesterol 270s on a regular basis We have given her a prescription for Crestor 5 mg daily

## 2014-11-18 ENCOUNTER — Telehealth: Payer: Self-pay | Admitting: *Deleted

## 2014-11-18 NOTE — Telephone Encounter (Signed)
Would increase losartan up to a full dose either once a day or half dose twice a day and continue to monitor blood pressure

## 2014-11-18 NOTE — Telephone Encounter (Signed)
Spoke w/ pt. She reports the past 5 days:  Fri 143/61 am, 150/62 pm, Sat 145/62 Sun 151/57 Mon 145/57 Tues 145/55 am , 168/65 pm  She reports that she was given the following directions at last ov on 7/11: Please take 1/2 pill of the amlodipine and losartan If pulse is elevated in the evening, take extra 1/2 dose of metoprolol If blood pressure is elevated in the evening, take either 1/2 dose of losartan or amlodipine  States that her meds were decreased at that time due to low BP, but she feels that her BP is staying too high and the meds no longer bring it down.  She would like to know if she should increase back to a whole pill, but is unsure which med to increase.

## 2014-11-18 NOTE — Telephone Encounter (Signed)
Pt c/o BP issue: STAT if pt c/o blurred vision, one-sided weakness or slurred speech  1. What are your last 5 BP readings?  11/17/14 145/55                 163/67 5pm                169/65 11 pm  11/17/14:147/61 8am                 155/65 few moments ago.   2. Are you having any other symptoms (ex. Dizziness, headache, blurred vision, passed out)?  Not having any symptoms  3. What is your BP issue?  Having problems controlling BP usually it works with meds but she's taken them all and it does not seem to be going down.

## 2014-11-18 NOTE — Telephone Encounter (Signed)
S/w pt regarding Dr. Donivan Scull recommendations. Pt states she took 100mg  losartan today but will continue to monitor BP and report back if BP continues to be elevated.  States she is taking all other medications as prescribed.

## 2014-12-23 ENCOUNTER — Other Ambulatory Visit: Payer: Self-pay | Admitting: Internal Medicine

## 2014-12-23 DIAGNOSIS — R140 Abdominal distension (gaseous): Secondary | ICD-10-CM

## 2014-12-29 ENCOUNTER — Ambulatory Visit
Admission: RE | Admit: 2014-12-29 | Discharge: 2014-12-29 | Disposition: A | Discharge status: Home / Self Care | Payer: Medicare Other | Source: Ambulatory Visit | Attending: Internal Medicine | Admitting: Internal Medicine

## 2014-12-29 DIAGNOSIS — I700 Atherosclerosis of aorta: Secondary | ICD-10-CM | POA: Insufficient documentation

## 2014-12-29 DIAGNOSIS — K8020 Calculus of gallbladder without cholecystitis without obstruction: Secondary | ICD-10-CM | POA: Diagnosis not present

## 2014-12-29 DIAGNOSIS — M4186 Other forms of scoliosis, lumbar region: Secondary | ICD-10-CM | POA: Insufficient documentation

## 2014-12-29 DIAGNOSIS — K449 Diaphragmatic hernia without obstruction or gangrene: Secondary | ICD-10-CM | POA: Insufficient documentation

## 2014-12-29 DIAGNOSIS — R140 Abdominal distension (gaseous): Secondary | ICD-10-CM | POA: Diagnosis present

## 2014-12-29 DIAGNOSIS — M5136 Other intervertebral disc degeneration, lumbar region: Secondary | ICD-10-CM | POA: Diagnosis not present

## 2014-12-29 DIAGNOSIS — K5730 Diverticulosis of large intestine without perforation or abscess without bleeding: Secondary | ICD-10-CM | POA: Insufficient documentation

## 2015-01-21 ENCOUNTER — Other Ambulatory Visit: Payer: Self-pay | Admitting: Internal Medicine

## 2015-01-21 DIAGNOSIS — I10 Essential (primary) hypertension: Secondary | ICD-10-CM

## 2015-01-21 DIAGNOSIS — R911 Solitary pulmonary nodule: Secondary | ICD-10-CM

## 2015-01-28 ENCOUNTER — Ambulatory Visit
Admission: RE | Admit: 2015-01-28 | Discharge: 2015-01-28 | Disposition: A | Discharge status: Home / Self Care | Payer: Medicare Other | Source: Ambulatory Visit | Attending: Internal Medicine | Admitting: Internal Medicine

## 2015-01-28 DIAGNOSIS — K8020 Calculus of gallbladder without cholecystitis without obstruction: Secondary | ICD-10-CM | POA: Diagnosis not present

## 2015-01-28 DIAGNOSIS — I10 Essential (primary) hypertension: Secondary | ICD-10-CM

## 2015-01-28 DIAGNOSIS — E041 Nontoxic single thyroid nodule: Secondary | ICD-10-CM | POA: Insufficient documentation

## 2015-01-28 DIAGNOSIS — I2510 Atherosclerotic heart disease of native coronary artery without angina pectoris: Secondary | ICD-10-CM | POA: Diagnosis not present

## 2015-01-28 DIAGNOSIS — R911 Solitary pulmonary nodule: Secondary | ICD-10-CM | POA: Diagnosis not present

## 2015-01-28 MED ORDER — IOHEXOL 300 MG/ML  SOLN
75.0000 mL | Freq: Once | INTRAMUSCULAR | Status: AC | PRN
  Administered 2015-01-28: 75 mL via INTRAVENOUS

## 2015-05-06 ENCOUNTER — Encounter: Payer: Self-pay | Admitting: Cardiovascular Disease

## 2015-05-06 ENCOUNTER — Ambulatory Visit (INDEPENDENT_AMBULATORY_CARE_PROVIDER_SITE_OTHER): Payer: Medicare Other | Admitting: Cardiovascular Disease

## 2015-05-06 VITALS — BP 130/52 | HR 64 | Ht 61.0 in | Wt 130.5 lb

## 2015-05-06 DIAGNOSIS — Z87898 Personal history of other specified conditions: Secondary | ICD-10-CM

## 2015-05-06 DIAGNOSIS — I158 Other secondary hypertension: Secondary | ICD-10-CM | POA: Diagnosis not present

## 2015-05-06 DIAGNOSIS — R55 Syncope and collapse: Secondary | ICD-10-CM

## 2015-05-06 DIAGNOSIS — E119 Type 2 diabetes mellitus without complications: Secondary | ICD-10-CM

## 2015-05-06 DIAGNOSIS — E785 Hyperlipidemia, unspecified: Secondary | ICD-10-CM

## 2015-05-06 NOTE — Patient Instructions (Addendum)
You are doing well.  For any low blood pressures, Decrease the amlodipine in 1/2 daily in the morning  Please call us if you have new issues that need to be addressed before your next appt.  Your physician wants you to follow-up in: 6 months.  You will receive a reminder letter in the mail two months in advance. If you don't receive a letter, please call our office to schedule the follow-up appointment.

## 2015-05-06 NOTE — Assessment & Plan Note (Signed)
Denies having significant dizziness on today's visit We'll decrease amlodipine given low blood pressures, very low diastolic pressures

## 2015-05-06 NOTE — Assessment & Plan Note (Signed)
We have encouraged continued exercise, careful diet management in an effort to lose weight. 

## 2015-05-06 NOTE — Assessment & Plan Note (Signed)
No recent near syncope or syncope. In an effort to avoid any events, we'll decrease blood pressure medication as detailed above

## 2015-05-06 NOTE — Progress Notes (Signed)
Patient ID: Claire Washington, female    DOB: 1924/07/24, 82 y.o.   MRN: [id number]  HPI Comments: Claire Washington  is a very pleasant 83 year-old woman with history of hypertension, chronic dizziness, benign positional vertigo, osteoarthritis, hyperlipidemia, carotid arterial disease, chronic cystitis, diabetes, who presents for routine followup of her dizziness, hypertension   Remote history of smoking until age 45 Prior history of poorly controlled blood pressure Episodes of syncope in the setting of GI issues/constipation, vasovagal episodes Less than 49% bilateral carotid arterial disease Sulfa allergy  In follow-up today, she has a sprained toe on her left foot, wearing a splint Reports her blood pressure has been running low. Review of her diarrhea blood pressure numbers shows systolic pressure [id number] up to [id number], diastolic blood pressure [id number], sometimes 50s. Frequently will decrease her losartan down to 50 mg Reports having some very mild swelling around her ankles bilaterally Previously we had recommended she decrease her amlodipine dose down to 5 mg daily. She had not done this on previous clinic visits  She reports that she is tolerating Crestor 5 mg daily  EKG on today's visit shows no sinus rhythm with rate 64 bpm, no significant ST or T-wave changes   Other past medical history  pneumonia November 2015.    syncope 08/25/2011. She was in her kitchen preparing dinner and she had acute loss of consciousness, hit her head, left temple on the refrigerator. She had a subarachnoid hemorrhage noted on evaluation at [facility], transferred to [facility] for further evaluation.  On her second episode, she reports having a cocktail, milk of magnesia with constipation. She was discharged to rehabilitation. While at rehabilitation, she was sitting on a bathroom commode, with constipation, had taken senna, she had lightheadedness and passed out for a brief period.   Previous Holter monitor and stress test  several years ago at [place].  Previous total cholesterol 270, LDL 177, HDL 60,    Allergies  Allergen Reactions  . Sulfa Antibiotics     Outpatient Encounter Prescriptions as of 05/06/2015  Medication Sig  . amLODipine (NORVASC) 10 MG tablet Take 10 mg by mouth daily.  Marland Kitchen aspirin 81 MG tablet Take 81 mg by mouth daily.  . fenofibrate 160 MG tablet Take 1 tablet (160 mg total) by mouth daily.  Marland Kitchen losartan (COZAAR) 100 MG tablet Take 100 mg by mouth daily.  . metoprolol succinate (TOPROL-XL) 50 MG 24 hr tablet Take 50 mg in the am and 25 mg in the pm. Take with or immediately following a meal.  . Multiple Vitamins-Minerals (CENTRUM SILVER PO) Take by mouth daily.  . Multiple Vitamins-Minerals (PRESERVISION AREDS PO) Take by mouth daily.  . Probiotic Product (PROBIOTIC & ACIDOPHILUS EX ST PO) Take by mouth daily.  . rosuvastatin (CRESTOR) 5 MG tablet Take 5 mg by mouth daily.  . [DISCONTINUED] ezetimibe (ZETIA) 10 MG tablet Take 1 tablet (10 mg total) by mouth daily. (Patient not taking: Reported on 05/06/2015)  . [DISCONTINUED] hydrochlorothiazide (MICROZIDE) 12.5 MG capsule Take 1 capsule (12.5 mg total) by mouth as needed. (Patient not taking: Reported on 05/06/2015)  . [DISCONTINUED] rosuvastatin (CRESTOR) 10 MG tablet Take 1 tablet (10 mg total) by mouth daily. (Patient not taking: Reported on 05/06/2015)   No facility-administered encounter medications on file as of 05/06/2015.    Past Medical History  Diagnosis Date  . Hypertension   . Osteoarthritis   . Hyperlipidemia   . Chronic cystitis   . Small vessel disease ([place])   .  Leg pain   . Cancer ([place])     basel cell  . Thyroid nodule   . Syncope and collapse 2013  . Vertigo   . Thyroid nodule     Past Surgical History  Procedure Laterality Date  . Cystoscopy    . Colonoscopy    . Total abdominal hysterectomy    . Bilateral salpingoophorectomy      Social History  reports that she has quit smoking. Her smoking use  included Cigarettes. She has a 12.5 pack-year smoking history. She has never used smokeless tobacco. She reports that she does not drink alcohol or use illicit drugs.  Family History family history includes Heart disease in her father.   Review of Systems  Constitutional: Negative.   Respiratory: Negative.   Cardiovascular: Negative.   Gastrointestinal: Negative.   Musculoskeletal: Positive for gait problem.  Neurological: Negative.   Hematological: Negative.   Psychiatric/Behavioral: Negative.   All other systems reviewed and are negative.   BP 130/52 mmHg  Pulse 64  Ht 5\' 1"  (1.549 m)  Wt 130 lb 8 oz (59.194 kg)  BMI 24.67 kg/m2  Physical Exam  Constitutional: She is oriented to person, place, and time. She appears well-developed and well-nourished.  Brace on her left foot  HENT:  Head: Normocephalic.  Nose: Nose normal.  Mouth/Throat: Oropharynx is clear and moist.  Eyes: Conjunctivae are normal. Pupils are equal, round, and reactive to light.  Neck: Normal range of motion. Neck supple. No JVD present.  Cardiovascular: Normal rate, regular rhythm, S1 normal, S2 normal, normal heart sounds and intact distal pulses.  Exam reveals no gallop and no friction rub.   No murmur heard. Pulmonary/Chest: Effort normal and breath sounds normal. No respiratory distress. She has no wheezes. She has no rales. She exhibits no tenderness.  Abdominal: Soft. Bowel sounds are normal. She exhibits no distension. There is no tenderness.  Musculoskeletal: Normal range of motion. She exhibits no edema or tenderness.  Lymphadenopathy:    She has no cervical adenopathy.  Neurological: She is alert and oriented to person, place, and time. Coordination normal.  Skin: Skin is warm and dry. No rash noted. No erythema.  Psychiatric: She has a normal mood and affect. Her behavior is normal. Judgment and thought content normal.    Assessment and Plan  Nursing note and vitals reviewed.

## 2015-05-06 NOTE — Assessment & Plan Note (Signed)
Recommended that she stay on her Crestor 5 mg daily Could restart zetia 10 mg daily as this has gone generic

## 2015-05-06 NOTE — Assessment & Plan Note (Signed)
Recommended she decrease the amlodipine down to 5 mg daily We have recommended this in the past for low blood pressure She also has some mild swelling around her ankles. Low-dose calcium channel blocker may help her swelling

## 2015-05-27 ENCOUNTER — Other Ambulatory Visit: Payer: Self-pay | Admitting: Internal Medicine

## 2015-05-27 DIAGNOSIS — R911 Solitary pulmonary nodule: Secondary | ICD-10-CM

## 2015-05-31 ENCOUNTER — Ambulatory Visit
Admission: RE | Admit: 2015-05-31 | Discharge: 2015-05-31 | Disposition: A | Discharge status: Home / Self Care | Payer: Medicare Other | Source: Ambulatory Visit | Attending: Internal Medicine | Admitting: Internal Medicine

## 2015-05-31 DIAGNOSIS — R911 Solitary pulmonary nodule: Secondary | ICD-10-CM | POA: Insufficient documentation

## 2015-05-31 DIAGNOSIS — E041 Nontoxic single thyroid nodule: Secondary | ICD-10-CM | POA: Insufficient documentation

## 2015-05-31 DIAGNOSIS — K8020 Calculus of gallbladder without cholecystitis without obstruction: Secondary | ICD-10-CM | POA: Diagnosis not present

## 2015-05-31 MED ORDER — IOHEXOL 300 MG/ML  SOLN
75.0000 mL | Freq: Once | INTRAMUSCULAR | Status: AC | PRN
  Administered 2015-05-31: 75 mL via INTRAVENOUS

## 2015-06-11 ENCOUNTER — Inpatient Hospital Stay: Payer: Medicare Other | Attending: Cardiothoracic Surgery | Admitting: Cardiothoracic Surgery

## 2015-06-11 VITALS — BP 132/72 | HR 91 | Temp 98.2°F | Ht 61.61 in | Wt 126.8 lb

## 2015-06-11 DIAGNOSIS — I10 Essential (primary) hypertension: Secondary | ICD-10-CM | POA: Diagnosis not present

## 2015-06-11 DIAGNOSIS — Z87891 Personal history of nicotine dependence: Secondary | ICD-10-CM | POA: Insufficient documentation

## 2015-06-11 DIAGNOSIS — Z7982 Long term (current) use of aspirin: Secondary | ICD-10-CM | POA: Insufficient documentation

## 2015-06-11 DIAGNOSIS — N3020 Other chronic cystitis without hematuria: Secondary | ICD-10-CM | POA: Diagnosis not present

## 2015-06-11 DIAGNOSIS — H9190 Unspecified hearing loss, unspecified ear: Secondary | ICD-10-CM | POA: Diagnosis not present

## 2015-06-11 DIAGNOSIS — E785 Hyperlipidemia, unspecified: Secondary | ICD-10-CM | POA: Insufficient documentation

## 2015-06-11 DIAGNOSIS — R918 Other nonspecific abnormal finding of lung field: Secondary | ICD-10-CM

## 2015-06-11 DIAGNOSIS — M1990 Unspecified osteoarthritis, unspecified site: Secondary | ICD-10-CM | POA: Insufficient documentation

## 2015-06-11 DIAGNOSIS — E041 Nontoxic single thyroid nodule: Secondary | ICD-10-CM | POA: Diagnosis not present

## 2015-06-11 DIAGNOSIS — Z85828 Personal history of other malignant neoplasm of skin: Secondary | ICD-10-CM | POA: Diagnosis not present

## 2015-06-11 DIAGNOSIS — I739 Peripheral vascular disease, unspecified: Secondary | ICD-10-CM | POA: Diagnosis not present

## 2015-06-11 DIAGNOSIS — Z79899 Other long term (current) drug therapy: Secondary | ICD-10-CM | POA: Diagnosis not present

## 2015-06-11 NOTE — Progress Notes (Signed)
Patient has a lung nodule that was seen on CT in 2016 and was evaluated by Dr. Ma Hillock who ordered a PET scan.  After the PET scan Dr. Ma Hillock recommended no f/u necessary per patient but when I look into [facility] chart she was to have colonoscopy then f/u with Dr. Ma Hillock but not f/u was kept.  She recently had another CT ordered by PCP and they would like for her to be evaluated by Dr. Genevive Bi.  Patient does not offer any symptoms today.

## 2015-06-11 NOTE — Progress Notes (Signed)
Patient ID: Claire Washington, female   DOB: 1924/05/04, 82 y.o.   MRN: [id number]  Chief Complaint  Patient presents with  . New Evaluation    lung nodule    Referred By Dr. Ginette Pitman Reason for Referral right upper lobe mass  HPI Location, Quality, Duration, Severity, Timing, Context, Modifying Factors, Associated Signs and Symptoms.  Claire Washington is a 82 y.o. female.  This patient was originally diagnosed with a right upper lobe mass back in December 2015. She states that the time she felt she had a CT scan done to assess a upper respiratory tract infection or pneumonia. Because of the presence of the upper lobe mass she's had several other CT scans over the year and in February of this year had another CT scan showing stability of the 2-2-1/2 cm mass present in the right upper lobe. She was seen by Dr. Ma Hillock in our oncology department who recommended a PET scan. The PET scan showed some mild uptake in the lung as well as something in the colon. She subsequently went on to get a colonoscopy with removal of some polyps but there is no evidence of colon cancer. She did not keep her follow-up with Dr. Loni Muse it after that procedure however but did keep her follow-up with her primary care physician. At this point in time she would like an additional consultation regarding management of her right upper lobe lesion.  Despite her advanced age of 40 years she is independent. She has a prior smoking history but quit 20-25 years ago. She usually drinks 1 glass of wine per night. Does not complain of any significant shortness of breath or cough.   Past Medical History  Diagnosis Date  . Hypertension   . Osteoarthritis   . Hyperlipidemia   . Chronic cystitis   . Small vessel disease ([place])   . Leg pain   . Thyroid nodule   . Syncope and collapse 2013  . Vertigo   . Thyroid nodule   . Cancer ([place])     basel cell    Past Surgical History  Procedure Laterality Date  . Cystoscopy    .  Colonoscopy    . Total abdominal hysterectomy    . Bilateral salpingoophorectomy      Family History  Problem Relation Age of Onset  . Heart disease Father     Social History Social History  Substance Use Topics  . Smoking status: Former Smoker -- 0.50 packs/day for 25 years    Types: Cigarettes  . Smokeless tobacco: Never Used  . Alcohol Use: No    Allergies  Allergen Reactions  . Hydrocortisone Swelling  . Sulfa Antibiotics     Current Outpatient Prescriptions  Medication Sig Dispense Refill  . amLODipine (NORVASC) 10 MG tablet Take 10 mg by mouth daily.    Marland Kitchen aspirin 81 MG tablet Take 81 mg by mouth daily.    Marland Kitchen losartan (COZAAR) 100 MG tablet Take 100 mg by mouth daily.    . metoprolol succinate (TOPROL-XL) 50 MG 24 hr tablet Take 50 mg in the am and 25 mg in the pm. Take with or immediately following a meal.    . Multiple Vitamins-Minerals (CENTRUM SILVER PO) Take by mouth daily. Reported on 06/11/2015    . Multiple Vitamins-Minerals (PRESERVISION AREDS PO) Take by mouth daily.    . rosuvastatin (CRESTOR) 5 MG tablet Take 5 mg by mouth daily.     No current facility-administered medications for this visit.  Review of Systems A complete review of systems was asked and was negative except for the following positive findings hearing difficulty, swelling of her left ankle, joint pain, rash, hayfever.  Blood pressure 132/72, pulse 91, temperature 98.2 F (36.8 C), temperature source Tympanic, height 5' 1.61" (1.565 m), weight 126 lb 12.2 oz (57.5 kg), SpO2 94 %.  Physical Exam CONSTITUTIONAL:  Pleasant, well-developed, well-nourished, and in no acute distress. EYES: Pupils equal and reactive to light, Sclera non-icteric EARS, NOSE, MOUTH AND THROAT:  The oropharynx was clear.  Dentition is edentulous.  Oral mucosa pink and moist. LYMPH NODES:  Lymph nodes in the neck and axillae were normal RESPIRATORY:  Lungs were clear.  Normal respiratory effort without  pathologic use of accessory muscles of respiration CARDIOVASCULAR: Heart was regular without murmurs.  There were no carotid bruits. GI: The abdomen was soft, nontender, and nondistended. There were no palpable masses. There was no hepatosplenomegaly. There were normal bowel sounds in all quadrants. GU:  Rectal deferred.   MUSCULOSKELETAL:  Normal muscle strength and tone.  No clubbing or cyanosis.   SKIN:  There were no pathologic skin lesions.  There were no nodules on palpation. NEUROLOGIC:  Sensation is normal.  Cranial nerves are grossly intact. PSYCH:  Oriented to person, place and time.  Mood and affect are normal.  Data Reviewed Multiple CT scans  I have personally reviewed the patient's imaging, laboratory findings and medical records.    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 It appears to be stable over the last 15 months    Plan    I had a long talk with her today. I explained to her that given her advanced age I did not believe that surgical intervention would be possible and she declined any potential surgery in any event. In addition she declined any surgical biopsy or a percutaneous biopsy. She stated that even if she knew she had cancer she would not pursue radiation therapy or chemotherapy. Because of the very limited options based upon her decision I did not recommend any further follow-up. She did state that if the mass were to get larger she would consider potential radiation therapy but I explained her that at that point it may be too late to intervene with any meaningful hope of cure. She will consider all of her potential options but at the present time would just like to "let nature take its course" therefore I did not make any further follow-up appointments with me. She will think about whether she wants to pursue any additional intervention or follow-up scanning. I will leave this up to her primary care  physicians.       Nestor Lewandowsky, MD 06/11/2015, 10:40 AM

## 2015-07-29 ENCOUNTER — Telehealth: Payer: Self-pay | Admitting: Cardiovascular Disease

## 2015-07-29 NOTE — Telephone Encounter (Signed)
Spoke w/ pt.  Advised her that Crestor is generic and we should not have to send in new rx to indicate this, she can request from the pharmacy. Advised her that she may have to comparison shop w/ different pharmacies to get the best price. Asked her to call back if we can be of further assistance.

## 2015-07-29 NOTE — Telephone Encounter (Signed)
°  Patient wants to switch to generic Crestor.

## 2015-09-16 ENCOUNTER — Other Ambulatory Visit: Payer: Self-pay | Admitting: Internal Medicine

## 2015-09-16 ENCOUNTER — Ambulatory Visit
Admission: RE | Admit: 2015-09-16 | Discharge: 2015-09-16 | Disposition: A | Discharge status: Home / Self Care | Payer: Medicare Other | Source: Ambulatory Visit | Attending: Internal Medicine | Admitting: Internal Medicine

## 2015-09-16 DIAGNOSIS — M7989 Other specified soft tissue disorders: Secondary | ICD-10-CM

## 2015-09-16 DIAGNOSIS — M79662 Pain in left lower leg: Secondary | ICD-10-CM

## 2015-09-27 ENCOUNTER — Telehealth: Payer: Self-pay | Admitting: Cardiovascular Disease

## 2015-09-27 NOTE — Telephone Encounter (Signed)
Patient called and reported some elevated blood pressures. Reviewing her medications she was not taking the 25 mg metoprolol in the afternoon. Instructed her on what medications she should be taking at this time for blood pressure control. We spoke at length on how taking these medications and monitoring are important. Metoprolol is ordered 50 mg in the AM and then 25 mg in the PM and she was not taking that PM dose. Instructed her to start taking that medication 12 hours apart and see if her blood pressures improve. Let her know to please call back if her blood pressures do not improve after making these changes. She verbalized understanding of our conversation and agreement with plan with no further questions at this time.

## 2015-09-27 NOTE — Telephone Encounter (Signed)
Pt calling stating she is having some issues with BP  She is on 3 medications and they do not seem to be working Since Thursday its been a bit high and she is concerned about this.   Pt c/o BP issue: STAT if pt c/o blurred vision, one-sided weakness or slurred speech  1. What are your last 5 BP readings?  09/23/15: 8 am 142/56 116/70 at Dr Linton Ham office 09/24/15 8 am 150/52   11 pm  169/69  09/25/15 8 am 135/58  10:30 pm 179/73 09/26/15  8 am 169/64  5 pm 166/68 10:30 pm  150/57  09/27/15 8 am 158/62 2. Are you having any other symptoms (ex. Dizziness, headache, blurred vision, passed out)? No   3. What is your BP issue? BP is high and she is taking the extra medication she was told to be taking but it does not seem to be working Would like to know what she needs to do.

## 2015-10-12 ENCOUNTER — Ambulatory Visit: Payer: Medicare Other | Attending: Internal Medicine

## 2015-10-12 VITALS — BP 136/45 | HR 63

## 2015-10-12 DIAGNOSIS — M6281 Muscle weakness (generalized): Secondary | ICD-10-CM | POA: Diagnosis present

## 2015-10-12 DIAGNOSIS — R42 Dizziness and giddiness: Secondary | ICD-10-CM

## 2015-10-12 DIAGNOSIS — R262 Difficulty in walking, not elsewhere classified: Secondary | ICD-10-CM | POA: Diagnosis not present

## 2015-10-12 NOTE — Therapy (Signed)
[place] MAIN [facility] SERVICES [place], [place], [address] Phone: [phone]   Fax:  [phone]  Physical Therapy Evaluation  Patient Details  Name: Claire Washington MRN: [id number] Date of Birth: 07-19-1924 Referring Provider: Dr. Ginette Pitman  Encounter Date: 10/12/2015      PT End of Session - 10/13/15 1700    Visit Number 1   Number of Visits 17   Date for PT Re-Evaluation 12/16/2015   Authorization Type g codes every 10th visit/30 days   PT Start Time 0930   PT Stop Time 1030   PT Time Calculation (min) 60 min   Equipment Utilized During Treatment Gait belt   Activity Tolerance Patient tolerated treatment well   Behavior During Therapy [facility] for tasks assessed/performed      Past Medical History  Diagnosis Date  . Hypertension   . Osteoarthritis   . Hyperlipidemia   . Chronic cystitis   . Small vessel disease ([place])   . Leg pain   . Thyroid nodule   . Syncope and collapse 2013  . Vertigo   . Thyroid nodule   . Cancer ([place])     basel cell    Past Surgical History  Procedure Laterality Date  . Cystoscopy    . Colonoscopy    . Total abdominal hysterectomy    . Bilateral salpingoophorectomy      Filed Vitals:   10/12/15 0933  BP: 136/45  Pulse: 63  SpO2: 99%         Subjective Assessment - 10/13/15 1639    Subjective Dizziness   Pertinent History Pt reports that she has been having problems with her balance for an extended period of time but progressively worsened recently. She also reports weakness with ascending stairs and finds herself needing to pull up on railings. She reports that she is very unsteady while descending stairs, ramps, and curbs. She uses a rollator for long distance community ambulation. For short distance community ambulation she uses a single point cane. At home she furniture walks but does not use an assistive device. She has a history of dizziness and has participated in vestibular  rehab in the past which was helpful. Pt unable to recall her vestibular diagnosis. Over the last 1.5 weeks she has started performing VOR x 1 horizontal exercise at home. She has not noticed any improvement in her dizziness since starting the exercise. She has one episode of syncope in the setting of GI issues/constipation in 2013. Pt denies falls in the last 12 months however states, "I am very careful." She reports that she has limited her activities due her balance/dizziness complaints.    Patient Stated Goals Improve balance and decrease risk for falls   Currently in Pain? No/denies     VESTIBULAR AND BALANCE EVALUATION  Onset Date: Multiple years  HISTORY:  Subjective history of current problem:  Pt reports that she has been having problems with her balance for an extended period of time but progressively worsened recently. She also reports weakness with ascending stairs and finds herself needing to pull up on railings. She reports that she is very unsteady while descending stairs, ramps, and curbs. She uses a rollator for long distance community ambulation. For short distance community ambulation she uses a single point cane. At home she furniture walks but does not use an assistive device. She has a history of dizziness and has participated in vestibular rehab in the past which was helpful. Pt unable to recall  her vestibular diagnosis. Over the last 1.5 weeks she has started performing VOR x 1 horizontal exercise at home. She has not noticed any improvement in her dizziness since starting the exercise. She has one episode of syncope in the setting of GI issues/constipation in 2013. Pt denies falls in the last 12 months however states, "I am very careful." She reports that she has limited her activities due her balance/dizziness complaints.   Description of dizziness: (vertigo, unsteadiness, lightheadedness, falling, general unsteadiness, aural fullness). Pt has difficulty describing symptoms. She  mostly complains of general unsteadiness but states sometimes she does "have dizziness." Denies vertigo. Denies lightheadedness or presyncopal symptoms.  Frequency: Imbalance is constant but dizziness is intermittent. Unable to describe frequency.  Symptom nature: (motion provoked/positional/spontaneous/constant, variable, intermittent). Dizziness appears to be intermittent and motion provoked.   Provocative Factors: Head motions with respect to dizziness however does not report that it is velocity dependendent Easing Factors: Sitting  Progression of symptoms: (better, worse, no change since onset): Dizziness unchanged but balance has worsened History of similar episodes: Yes  Falls (yes/no): No Number of falls in past 6 months: No   Prior Functional Level: Independent for ADLs/IADLs  Auditory complaints (tinnitus, pain, drainage): None Vision (last eye exam, diplopia, recent changes): Reading glasses, last appointment 7-8 months. Otherwise no visual changes.    Current Symptoms: Denies: vertigo, N & V, dysarthria, dysphagia, bowel and bladder changes, recent weight loss/gain, lightheadedness, headache, Confirms: rocking, general unsteadiness, imbalance, tilting, swaying. Review of systems negative for red flags.   EXAMINATION  POSTURE:   NEUROLOGICAL SCREEN: (2+ unless otherwise noted.) N=normal  Ab=abnormal  Level Dermatome R L Myotome R L Reflex R L  C3 Anterior Neck N N Sidebend C2-3 N N Jaw CN V    C4 Top of Shoulder N N Shoulder Shrug C4 N N Hoffman's UMN    C5 Lateral Upper Arm N N Shoulder ABD C4-5 N N Biceps C5-6    C6 Lateral Arm/ Thumb N N Arm Flex/ Wrist Ext C5-6 N N Brachiorad. C5-6    C7 Middle Finger N N Arm Ext//Wrist Flex C6-7 N N Triceps C7    C8 4th & 5th Finger N N Flex/ Ext Carpi Ulnaris C8 N N Patella    T1 Medial Arm N N Interossei T1 N N Gastrocnemius    L2 Medial thigh/groin N N Illiopsoas (L2-3) N N     L3 Lower thigh/med.knee N N Quadriceps (L3-4) N N  Patellar (L3-4)    L4 Medial leg/lat thigh N N Tibialis Ant (L4-5) N N     L5 Lat. leg & dorsal foot N N EHL (L5) N N     S1 post/lat foot/thigh/leg N N Gastrocnemius (S1-2) N N Gastrocnemius (S1)    S2 Post./med. thigh & leg N N Hamstrings (L4-S3) N N Babinski      SOMATOSENSORY:         Sensation           Intact      Diminished         Absent  Light touch Normal     Any N & T in extremities or weakness:      COORDINATION: Finger to Nose: Normal Pronator Drift: Normal   MUSCULOSKELETAL SCREEN: Cervical Spine ROM: Age appropriate loss of range of motion in all directions. Painless   ROM: Limited in all planes however painless and grossly appropriate for age  MMT: Generalized weakness/deconditioning noted with strength testing. Most notable weakness with  shoulder flexion, elbow extension, and hip flexion  Functional Mobility: Definite use of hands for transfers. Pt unsteady and shaky with standing. Unable to take large forward step or place feet together without loss of balance without UE support.   Gait: Scanning of visual environment with gait is: Fair when using rollator for ambulation. Pt with decreased step length bilateral and ambulates with increased stance width. Able to ambulate short distances with and without rollator.    OCULOMOTOR / VESTIBULAR TESTING:  Oculomotor Exam- Room Light  Normal Abnormal Comments  Ocular Alignment N    Ocular ROM N    Spontaneous Nystagmus N    End-Gaze Nystagmus N  Age appropriate  Smooth Pursuit  A Saccadic  Saccades  A Multiple corrections required to reach target  VOR  A Dizziness and saccades noted  VOR Cancellation N    Left Head Thrust  A Increased latency noted for correction with L head thrust but positive bilaterally  Right Head Thrust  A   Head Shaking Nystagmus  A L horizontal beating nystagmus  Static Acuity     Dynamic Acuity       Oculomotor Exam- Fixation Suppressed  Normal Abnormal Comments  Ocular Alignment  N    Ocular ROM N    Spontaneous Nystagmus N    End-Gaze Nystagmus  A Mid-range L gaze,  L horizontal beating nystagmus  Left Head Thrust     Right Head Thrust     Head Shaking Nystagmus       BPPV TESTS: Deferred, no history currently. Prior history of BPPV treated at ENT   FUNCTIONAL OUTCOME MEASURES:  Results Comments  DHI 52/100 Moderate perception of disability  ABC Scale 28.75% Low confidence, in need of intervention  DGI    BERG 21/56 High fall risk, in need of intervention  TUG 16.9 seconds Above cut-off for fall risk  5TSTS 18.0 seconds Above cut-off for fall risk  10 meter Walking Speed 0.88 m/s with rollator, 0.84 m/s without rollator Limited community ambulator      Pt with increased trunk sway in sitting with eyes closed during pronator drift testing. Falls immediately with eyes closed in standing.   TREATMENT  Pt issued VOR x 1 horizontal. Performed with patient and correction made to technique. Pt had been performing too slow at home and not engaging her vestibular system adequately. Corrections made which provoke patient's dizziness. Handout and extensive education provided to patient regarding technique.                 [facility] PT Assessment - 10/12/15 0945    Assessment   Referring Provider Dr. Ginette Pitman   Onset Date/Surgical Date 08/16/11   Hand Dominance Right   Next MD Visit 4 months from now with Dr. Ginette Pitman   Prior Therapy Yes, pt improved with vestibular therapy   Precautions   Precautions Fall   Restrictions   Weight Bearing Restrictions No   Balance Screen   Has the patient fallen in the past 6 months No   Has the patient had a decrease in activity level because of a fear of falling?  Yes   Is the patient reluctant to leave their home because of a fear of falling?  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 assessed   Observation/Other Assessments   Other Surveys  Other Surveys   Activities of Balance Confidence Scale (ABC Scale)  28.75   Dizziness Handicap Inventory (DHI)  52/100   Standardized Balance Assessment   Standardized Balance Assessment Berg Balance Test;Timed Up and Go Test;Five Times Sit to Stand;10 meter walk test   Five times sit to stand comments  18.0 seconds   10 Meter Walk 11.35 seconds with rollator, 11.9 without assistive device   Berg Balance Test   Sit to Stand Able to stand  independently using hands   Standing Unsupported Able to stand 2 minutes with supervision   Sitting with Back Unsupported but Feet Supported on Floor or Stool Able to sit safely and securely 2 minutes   Stand to Sit Controls descent by using hands   Transfers Able to transfer safely, definite need of hands   Standing Unsupported with Eyes Closed Needs help to keep from falling   Standing Ubsupported with Feet Together Needs help to attain position and unable to hold for 15 seconds   From Standing, Reach Forward with Outstretched Arm Can reach forward >5 cm safely (2")   From Standing Position, Pick up Object from Floor Able to pick up shoe, needs supervision   From Standing Position, Turn to Look Behind Over each Shoulder Needs assist to keep from losing balance and falling   Turn 360 Degrees Needs assistance while turning   Standing Unsupported, Alternately Place Feet on Step/Stool Needs assistance to keep from falling or unable to try   Standing Unsupported, One Foot in [employer] balance while stepping or standing   Standing on One Leg Unable to try or needs assist to prevent fall   Total Score 21   Timed Up and Go Test   Normal TUG  (seconds) 16.9  with rollator, 15.6 without assistive device.                            PT Education - 10/13/15 1659    Education provided Yes   Education Details HEP, plan of care   Person(s) Educated Patient   Methods Explanation;Demonstration;Verbal cues;Handout   Comprehension Returned demonstration;Verbalized understanding;Verbal cues required             PT Long Term Goals - 10/13/15 1709    PT LONG TERM GOAL #1   Title Pt will be independent with HEP in order to reduce fall risk, improve strength, and improve function at home   Time 8   Period Weeks   Status New   PT LONG TERM GOAL #2   Title Pt will improve BERG by at least 5 points in order to demonstrate significant improvement in balance and decrease fall risk   Baseline 10/12/15: 21/56   Time 8   Period Weeks   Status New   PT LONG TERM GOAL #3   Title Pt will improve 5TSTS by at least 2.3 seconds in order to demonstrates clinically significant improvement in LE strength and balance   Baseline 10/12/15: 18.0 seconds   Time 8   Period Weeks   Status New   PT LONG TERM GOAL #4   Title Pt will decrease DHI by at least 18 points in order to demonstrate clinically significant reduction in perception of handicap  Baseline 10/12/15: 52/100   Time 8   Period Weeks   Status New   PT LONG TERM GOAL #5   Title Pt will demonstrate increase in ABC by at least 13% in order to demonstrate significant improvement in balance confidence and decrease risk for falls   Baseline 10/12/15: 28.75%   Time 8   Period Weeks   Status New               Plan - 10/13/15 1707    Clinical Impression Statement Pt was referred to vestibular physical therapy for poor balance and dizziness. Patient's primary concern is progressively worsening balance. She has performed vestibular physical therapy in the past with benefit. Pt reports being very careful during mobility and therefore has not suffered any falls in the  last 12 months. PT evaluation reveals combination of central and peripheral signs for dizziness/imbalance. Pt with saccades during smooth pursuit. She requires multiple corrections to reach target with horizontal and vertical saccade testing. Positive VOR and VOR thrust testing bilaterally. Fixation suppression shows L horizontal beating nystagmus with midrange L horizontal gaze. L horizontal beating headshake nystagmus. BERG: 21/56 indicating high risk for falls. 5TSTS and TUB above cut-off for increased fall risk. DHI of 52/100 indicating moderate perception of disability. Pt will benefit from skilled PT services for vestibular and balance therapy in order address deficits, reduce fall risk, and improve function at home.     Rehab Potential Fair   Clinical Impairments Affecting Rehab Potential Positive: motivation, good safety awareness; Negative: chronicity   PT Frequency 2x / week   PT Duration 8 weeks   PT Treatment/Interventions ADLs/Self Care Home Management;Aquatic Therapy;Canalith Repostioning;Cryotherapy;Electrical Stimulation;Iontophoresis 4mg /ml Dexamethasone;Moist Heat;Traction;Ultrasound;DME Instruction;Gait training;Stair training;Functional mobility training;Therapeutic activities;Therapeutic exercise;Balance training;Neuromuscular re-education;Patient/family education;Manual techniques;Energy conservation;Vestibular   PT Next Visit Plan Initiate and perform balance as well as strengthening HEP, review VOR x 1 horizontal   PT Home Exercise Plan VOR x 1 horizontal in sitting 60 seconds x 3, 4 times/day   Consulted and Agree with Plan of Care Patient      Patient will benefit from skilled therapeutic intervention in order to improve the following deficits and impairments:  Abnormal gait, Decreased balance, Decreased mobility, Decreased strength, Difficulty walking, Dizziness  Visit Diagnosis: Difficulty in walking, not elsewhere classified - Plan: PT plan of care  cert/re-cert  Dizziness and giddiness - Plan: PT plan of care cert/re-cert  Muscle weakness (generalized) - Plan: PT plan of care cert/re-cert      G-Codes - [id number]    Functional Assessment Tool Used clinical judgement, DHI, BERG, ABC, 51m gait speed, TUG, 5TSTS   Functional Limitation Mobility: Walking and moving around   Mobility: Walking and Moving Around Current Status [id number]) At least 60 percent but less than 80 percent impaired, limited or restricted   Mobility: Walking and Moving Around Goal Status [phone]) At least 20 percent but less than 40 percent impaired, limited or restricted       Problem List Patient Active Problem List   Diagnosis Date Noted  . Hypertension 12/04/2011  . Hyperlipidemia 12/04/2011  . Syncope 12/04/2011  . Diabetes mellitus ([place]) 12/04/2011  . H/O dizziness 12/04/2011  . Smoking hx 12/04/2011   Phillips Grout PT, DPT   Aylla Huffine 10/13/2015, 5:20 PM  [place] MAIN [facility] SERVICES [address] [place], [place], [address] Phone: [phone]   Fax:  [phone]  Name: Dotsie Dorff MRN: [id number] Date of Birth: July 23, 1924

## 2015-10-13 NOTE — Patient Instructions (Signed)
Pt issued VOR x 1 horizontal 60 seconds x 3, 4 times/day

## 2015-10-22 ENCOUNTER — Ambulatory Visit: Payer: Medicare Other | Attending: Internal Medicine

## 2015-10-22 VITALS — BP 146/46 | HR 66

## 2015-10-22 DIAGNOSIS — M6281 Muscle weakness (generalized): Secondary | ICD-10-CM

## 2015-10-22 DIAGNOSIS — R42 Dizziness and giddiness: Secondary | ICD-10-CM | POA: Diagnosis present

## 2015-10-22 DIAGNOSIS — R262 Difficulty in walking, not elsewhere classified: Secondary | ICD-10-CM | POA: Diagnosis not present

## 2015-10-22 NOTE — Therapy (Signed)
[place] MAIN [facility] SERVICES [place], [place], [address] Phone: [phone]   Fax:  [phone]  Physical Therapy Treatment  Patient Details  Name: Claire Washington MRN: [id number] Date of Birth: Apr 19, 1924 Referring Provider: Dr. Ginette Pitman  Encounter Date: 10/22/2015      PT End of Session - 10/22/15 1024    Visit Number 2   Number of Visits 17   Date for PT Re-Evaluation December 11, 2015   Authorization Type g codes every 10th visit/30 days   PT Start Time 1000   PT Stop Time 1050   PT Time Calculation (min) 50 min   Equipment Utilized During Treatment Gait belt   Activity Tolerance Patient tolerated treatment well   Behavior During Therapy [facility] for tasks assessed/performed      Past Medical History  Diagnosis Date  . Hypertension   . Osteoarthritis   . Hyperlipidemia   . Chronic cystitis   . Small vessel disease ([place])   . Leg pain   . Thyroid nodule   . Syncope and collapse 2013  . Vertigo   . Thyroid nodule   . Cancer ([place])     basel cell    Past Surgical History  Procedure Laterality Date  . Cystoscopy    . Colonoscopy    . Total abdominal hysterectomy    . Bilateral salpingoophorectomy      Filed Vitals:   10/22/15 1003  BP: 146/46  Pulse: 66  SpO2: 98%        Subjective Assessment - 10/22/15 1002    Subjective Pt reports she is doing well today. She was having a lot of dizziness yesterday but is unable to attribute it to a particular cause. She is performing HEP at home and denies dizziness. States she is unsure about how far to turn her head and how fast to move it. No recent changes in health or medications   Pertinent History Pt reports that she has been having problems with her balance for an extended period of time but progressively worsened recently. She also reports weakness with ascending stairs and finds herself needing to pull up on railings. She reports that she is very unsteady while  descending stairs, ramps, and curbs. She uses a rollator for long distance community ambulation. For short distance community ambulation she uses a single point cane. At home she furniture walks but does not use an assistive device. She has a history of dizziness and has participated in vestibular rehab in the past which was helpful. Pt unable to recall her vestibular diagnosis. Over the last 1.5 weeks she has started performing VOR x 1 horizontal exercise at home. She has not noticed any improvement in her dizziness since starting the exercise. She has one episode of syncope in the setting of GI issues/constipation in 2013. Pt denies falls in the last 12 months however states, "I am very careful." She reports that she has limited her activities due her balance/dizziness complaints.    Patient Stated Goals Improve balance and decrease risk for falls   Currently in Pain? Yes  Reports chronic L knee pain but unrelated to current treatment      TREATMENT  Neuromuscular Re-education  VOR VOR x 1 horizontal in sitting x 60 seconds (5/10 dizziness); VOR x 1 vertical in sitting x 60 seconds (6/10); VOR x 1 horizontal in standing x 60 seconds (6/10); VOR x 1 vertical in standing x 60 seconds (6/10)  Toe Taps 4" toe taps alternating  LE 2 x 10 each, attempted without UE support but pt unable to perform. Performs with 2 finger assist on // bars;  Head Turns Feet slightly apart performed horizontal and vertical head turns 1 minute each x 2. Attempted with feet together but pt unable to perform due to LOB. With feet apart pt with infrequent LOB requiring UE support but improved stability. Added to HEP and encouraged pt to perform in corner with chair in front of her.  Side Stepping Performed side stepping in // bars without UE support x 2 lengths to improve single leg stance time and lateral stepping strategies;  Retro Ambulation Performed retro ambulation in // bars without UE support x 4 lengths with  intermittent LOB requiring UE support on bars and min/modA+1 from therapist to correct;  Ther-ex Sit to stand from regular height chair with Airex on seat without UE support 2 x 10. Attempted from chair only but pt unable to perform due to weakness;  Extensive education with patient regarding safety with HEP. Issued additional HEP and provided written sheet and verbal instructions.                          PT Education - 10/22/15 1011    Education provided Yes   Education Details HEP corrected and added new exercises   Person(s) Educated Patient   Methods Explanation;Demonstration;Handout   Comprehension Verbalized understanding;Returned demonstration             PT Long Term Goals - 10/13/15 1709    PT LONG TERM GOAL #1   Title Pt will be independent with HEP in order to reduce fall risk, improve strength, and improve function at home   Time 8   Period Weeks   Status New   PT LONG TERM GOAL #2   Title Pt will improve BERG by at least 5 points in order to demonstrate significant improvement in balance and decrease fall risk   Baseline 10/12/15: 21/56   Time 8   Period Weeks   Status New   PT LONG TERM GOAL #3   Title Pt will improve 5TSTS by at least 2.3 seconds in order to demonstrates clinically significant improvement in LE strength and balance   Baseline 10/12/15: 18.0 seconds   Time 8   Period Weeks   Status New   PT LONG TERM GOAL #4   Title Pt will decrease DHI by at least 18 points in order to demonstrate clinically significant reduction in perception of handicap   Baseline 10/12/15: 52/100   Time 8   Period Weeks   Status New   PT LONG TERM GOAL #5   Title Pt will demonstrate increase in ABC by at least 13% in order to demonstrate significant improvement in balance confidence and decrease risk for falls   Baseline 10/12/15: 28.75%   Time 8   Period Weeks   Status New               Plan - 10/22/15 1024    Clinical Impression  Statement Pt requires correction with VOR exercise with respect to speed and rotation. She reports onset of dizziness after VOR with resolves with brief seated rest break. She demonstrates very poor balance with head turning activities without UE support. Pt also with very poor single leg balance during attempted toe taps. She is unable to perform sit to stand without UE assist and issued for HEP. Pt encouraged to follow-up as scheduled.  Rehab Potential Fair   Clinical Impairments Affecting Rehab Potential Positive: motivation, good safety awareness; Negative: chronicity   PT Frequency 2x / week   PT Duration 8 weeks   PT Treatment/Interventions ADLs/Self Care Home Management;Aquatic Therapy;Canalith Repostioning;Cryotherapy;Electrical Stimulation;Iontophoresis 4mg /ml Dexamethasone;Moist Heat;Traction;Ultrasound;DME Instruction;Gait training;Stair training;Functional mobility training;Therapeutic activities;Therapeutic exercise;Balance training;Neuromuscular re-education;Patient/family education;Manual techniques;Energy conservation;Vestibular   PT Next Visit Plan Attempt leg press if knee pain allows. Continue with ther-ex and balance activities. Progress VOR to seated with conflicting background as appropriate.   PT Home Exercise Plan VOR x 1 horizontal in sitting 60 seconds x 3, 4 times/day (not currently progressed to standing due to poor technique and instability), sit to stand without UE support from elevated chair, feet apart standing with horizontal and vertical head turns   Consulted and Agree with Plan of Care Patient      Patient will benefit from skilled therapeutic intervention in order to improve the following deficits and impairments:  Abnormal gait, Decreased balance, Decreased mobility, Decreased strength, Difficulty walking, Dizziness  Visit Diagnosis: Difficulty in walking, not elsewhere classified  Dizziness and giddiness  Muscle weakness (generalized)     Problem  List Patient Active Problem List   Diagnosis Date Noted  . Hypertension 12/04/2011  . Hyperlipidemia 12/04/2011  . Syncope 12/04/2011  . Diabetes mellitus ([place]) 12/04/2011  . H/O dizziness 12/04/2011  . Smoking hx 12/04/2011   Phillips Grout PT, DPT   Murriel Holwerda 10/22/2015, 11:28 AM  [place] MAIN [facility] SERVICES [address] [place], [place], [address] Phone: [phone]   Fax:  [phone]  Name: Claire Washington MRN: [id number] Date of Birth: Aug 25, 1924

## 2015-10-22 NOTE — Patient Instructions (Addendum)
SIT TO STAND: Feet apart    Place feet close apart. Lean chest forward. Raise hips and straighten knees to stand. Start with a pillow on the sitting surface to elevate. Once you are able to perform 2 sets of 10 with ease remove the pillow and perform from regular height chair. Perform 10 times and then repeat. Perform 2 times/day.   Feet Apart, Head Motion - Eyes Open    With eyes open, feet apart, move head slowly: left and right for 30 seconds. Then move up and down for 30 seconds. Perform 2 times in each direction. Perform 2 times/day. Be sure to perform with your back to a corner and a chair in front for safety.

## 2015-10-25 ENCOUNTER — Encounter: Payer: Self-pay | Admitting: Physical Therapy

## 2015-10-25 ENCOUNTER — Ambulatory Visit: Payer: Medicare Other | Admitting: Physical Therapy

## 2015-10-25 DIAGNOSIS — R42 Dizziness and giddiness: Secondary | ICD-10-CM

## 2015-10-25 DIAGNOSIS — M6281 Muscle weakness (generalized): Secondary | ICD-10-CM

## 2015-10-25 DIAGNOSIS — R262 Difficulty in walking, not elsewhere classified: Secondary | ICD-10-CM

## 2015-10-25 NOTE — Therapy (Signed)
[place] MAIN [facility] SERVICES [address] [place], [place], [address] Phone: [phone]   Fax:  [phone]  Physical Therapy Treatment  Patient Details  Name: Claire Washington MRN: [id number] Date of Birth: 07-18-1924 Referring Provider: Dr. Ginette Pitman  Encounter Date: 10/25/2015      PT End of Session - 10/25/15 1439    Visit Number 3   Number of Visits 17   Date for PT Re-Evaluation 12-28-2015   Authorization Type g codes every 10th visit/30 days   PT Start Time 1347   PT Stop Time 1431   PT Time Calculation (min) 44 min   Equipment Utilized During Treatment Gait belt   Activity Tolerance Patient tolerated treatment well   Behavior During Therapy [facility] for tasks assessed/performed      Past Medical History  Diagnosis Date  . Hypertension   . Osteoarthritis   . Hyperlipidemia   . Chronic cystitis   . Small vessel disease ([place])   . Leg pain   . Thyroid nodule   . Syncope and collapse 2013  . Vertigo   . Thyroid nodule   . Cancer ([place])     basel cell    Past Surgical History  Procedure Laterality Date  . Cystoscopy    . Colonoscopy    . Total abdominal hysterectomy    . Bilateral salpingoophorectomy      There were no vitals filed for this visit.      Subjective Assessment - 10/25/15 1437    Subjective Pt reports she has been compliant with her HEP.  She performed her VOR exercises at home before her appointment today and was unsure  if that brought on her dizziness.  Since, dizziness has resolved.     Pertinent History Pt reports that she has been having problems with her balance for an extended period of time but progressively worsened recently. She also reports weakness with ascending stairs and finds herself needing to pull up on railings. She reports that she is very unsteady while descending stairs, ramps, and curbs. She uses a rollator for long distance community ambulation. For short distance community ambulation  she uses a single point cane. At home she furniture walks but does not use an assistive device. She has a history of dizziness and has participated in vestibular rehab in the past which was helpful. Pt unable to recall her vestibular diagnosis. Over the last 1.5 weeks she has started performing VOR x 1 horizontal exercise at home. She has not noticed any improvement in her dizziness since starting the exercise. She has one episode of syncope in the setting of GI issues/constipation in 2013. Pt denies falls in the last 12 months however states, "I am very careful." She reports that she has limited her activities due her balance/dizziness complaints.    Patient Stated Goals Improve balance and decrease risk for falls      Treatment Vor x 1, seated with a  busy background for  60 seconds horizontal, reported dizziness of 2/10 that subsided after 1 min seated rest break  Vor x 1, seated with busy background for  60 seconds vertical, reported dizziness of 3/10 that subsided after about 1 min seated rest break   Sit to stands from arm chair without airex pad lift or UE support, pt required min VCs for forward lean and glut activation for upright posture, mod VCs for eccentric lowering and knee positioning, CGA for safety  Quantum leg press #75, 2 sets x  10 reps with min VCs to decrease terminal knee extension and eccentric control, no reported knee pain or discomfort  Step taps standing on blue airex pad tapping 6 inch step with fingertip A, 2 sets x 10 reps BLEs, CGA for stability Sidestepping with red theraband resistance around distal thigh, 2 laps in both directions, 1 HHA for stability, min VCs to increase step length  Cone tapping anterior/lateral/posterior 8 reps BLEs, 1 HHA with min VCs to decrease UE support, min A for stability  Tandem walking forward/backward on airex balance beam x 2 laps, 1 HHA, CGA for safety  Balloon toss standing on airex blue pad, 2 bouts x 30 seconds, min A for  stability                            PT Education - 10/25/15 1438    Education provided Yes   Education Details reeducated on HEP, sit to stand technique     Person(s) Educated Patient   Methods Explanation;Demonstration;Verbal cues   Comprehension Verbalized understanding;Returned demonstration;Verbal cues required;Tactile cues required             PT Long Term Goals - 10/13/15 1709    PT LONG TERM GOAL #1   Title Pt will be independent with HEP in order to reduce fall risk, improve strength, and improve function at home   Time 8   Period Weeks   Status New   PT LONG TERM GOAL #2   Title Pt will improve BERG by at least 5 points in order to demonstrate significant improvement in balance and decrease fall risk   Baseline 10/12/15: 21/56   Time 8   Period Weeks   Status New   PT LONG TERM GOAL #3   Title Pt will improve 5TSTS by at least 2.3 seconds in order to demonstrates clinically significant improvement in LE strength and balance   Baseline 10/12/15: 18.0 seconds   Time 8   Period Weeks   Status New   PT LONG TERM GOAL #4   Title Pt will decrease DHI by at least 18 points in order to demonstrate clinically significant reduction in perception of handicap   Baseline 10/12/15: 52/100   Time 8   Period Weeks   Status New   PT LONG TERM GOAL #5   Title Pt will demonstrate increase in ABC by at least 13% in order to demonstrate significant improvement in balance confidence and decrease risk for falls   Baseline 10/12/15: 28.75%   Time 8   Period Weeks   Status New               Plan - 10/25/15 1440    Clinical Impression Statement Pt required instruction on keeping her eyes stationary for seated VOR x 1 exercises.  Pt progressed toe taps by standing on airex blue pad using 1HHA.  Pt was able to perform cone taps anterior/posterior/laterally with finger tip assist and unable without any UE assist.  Pt enjoyed leg press and reported no  increased knee pain or discomfort.  With min VCs to lean forward and increase glut activation pt was able to stand from chair without airex pad today.  She would benefit from further skilled PT to continue to progress single leg balance,and LE stregnth to decrease her falls risk.     Rehab Potential Fair   Clinical Impairments Affecting Rehab Potential Positive: motivation, good safety awareness; Negative: chronicity   PT Frequency  2x / week   PT Duration 8 weeks   PT Treatment/Interventions ADLs/Self Care Home Management;Aquatic Therapy;Canalith Repostioning;Cryotherapy;Electrical Stimulation;Iontophoresis 4mg /ml Dexamethasone;Moist Heat;Traction;Ultrasound;DME Instruction;Gait training;Stair training;Functional mobility training;Therapeutic activities;Therapeutic exercise;Balance training;Neuromuscular re-education;Patient/family education;Manual techniques;Energy conservation;Vestibular   PT Next Visit Plan Continue leg press, advance hip strengthening, glut strengthening    PT Home Exercise Plan see pt. instructions, continuation of HEP   Consulted and Agree with Plan of Care Patient      Patient will benefit from skilled therapeutic intervention in order to improve the following deficits and impairments:  Abnormal gait, Decreased balance, Decreased mobility, Decreased strength, Difficulty walking, Dizziness  Visit Diagnosis: Difficulty in walking, not elsewhere classified  Muscle weakness (generalized)  Dizziness and giddiness     Problem List Patient Active Problem List   Diagnosis Date Noted  . Hypertension 12/04/2011  . Hyperlipidemia 12/04/2011  . Syncope 12/04/2011  . Diabetes mellitus ([place]) 12/04/2011  . H/O dizziness 12/04/2011  . Smoking hx 12/04/2011   Stacy Gardner, SPT  This entire session was performed under direct supervision and direction of a licensed therapist/therapist assistant . I have personally read, edited and approve of the note as written.     Trotter,Margaret PT, DPT 10/25/2015, 4:38 PM  [place] MAIN [facility] SERVICES [address] [place], [place], [address] Phone: [phone]   Fax:  [phone]  Name: Brave Muni MRN: [id number] Date of Birth: 1925-01-09

## 2015-10-29 ENCOUNTER — Ambulatory Visit: Payer: Medicare Other

## 2015-10-29 ENCOUNTER — Encounter: Payer: Self-pay | Admitting: Physical Therapy

## 2015-10-29 VITALS — BP 150/49 | HR 61

## 2015-10-29 DIAGNOSIS — R262 Difficulty in walking, not elsewhere classified: Secondary | ICD-10-CM

## 2015-10-29 DIAGNOSIS — R42 Dizziness and giddiness: Secondary | ICD-10-CM

## 2015-10-29 DIAGNOSIS — M6281 Muscle weakness (generalized): Secondary | ICD-10-CM

## 2015-10-29 NOTE — Therapy (Signed)
[place] MAIN [facility] SERVICES [address] [place], [place], [address] Phone: [phone]   Fax:  [phone]  Physical Therapy Treatment  Patient Details  Name: Claire Washington MRN: [id number] Date of Birth: 07/04/1924 Referring Provider: Dr. Ginette Pitman  Encounter Date: 10/29/2015      PT End of Session - 10/29/15 1037    Visit Number 4   Number of Visits 17   Date for PT Re-Evaluation 2015-12-30   Authorization Type g codes every 10th visit/30 days   PT Start Time 0950   PT Stop Time 1035   PT Time Calculation (min) 45 min   Equipment Utilized During Treatment Gait belt   Activity Tolerance Patient tolerated treatment well   Behavior During Therapy [facility] for tasks assessed/performed      Past Medical History  Diagnosis Date  . Hypertension   . Osteoarthritis   . Hyperlipidemia   . Chronic cystitis   . Small vessel disease ([place])   . Leg pain   . Thyroid nodule   . Syncope and collapse 2013  . Vertigo   . Thyroid nodule   . Cancer ([place])     basel cell    Past Surgical History  Procedure Laterality Date  . Cystoscopy    . Colonoscopy    . Total abdominal hysterectomy    . Bilateral salpingoophorectomy      Filed Vitals:   10/29/15 0954  BP: 150/49  Pulse: 61  SpO2: 98%        Subjective Assessment - 10/29/15 0954    Subjective Pt reports she is doing well today. She was only able to do half of her exercises yesterday but otherwise has been compliant with her HEP. Pt reports she is still having some dizziness. No specific questions or concerns at this time.     Pertinent History Pt reports that she has been having problems with her balance for an extended period of time but progressively worsened recently. She also reports weakness with ascending stairs and finds herself needing to pull up on railings. She reports that she is very unsteady while descending stairs, ramps, and curbs. She uses a rollator for long distance  community ambulation. For short distance community ambulation she uses a single point cane. At home she furniture walks but does not use an assistive device. She has a history of dizziness and has participated in vestibular rehab in the past which was helpful. Pt unable to recall her vestibular diagnosis. Over the last 1.5 weeks she has started performing VOR x 1 horizontal exercise at home. She has not noticed any improvement in her dizziness since starting the exercise. She has one episode of syncope in the setting of GI issues/constipation in 2013. Pt denies falls in the last 12 months however states, "I am very careful." She reports that she has limited her activities due her balance/dizziness complaints.    Patient Stated Goals Improve balance and decrease risk for falls   Currently in Pain? No/denies  Pt reports that her knee pain is improving      Treatment  Ther-ex  Quantum leg press #90 x 10, 105# x 10, 110# x 10, no reported knee pain or discomfort; Sit to stand from elevated mat table x 10, x 9. Cues for forward trunk lean and smooth transfer of momentum. Pt fatigues toward end of set and starts to demonstrate poor eccentric lowering, CGA to minA for safety;  Neuromuscular Re-education  VOR VOR x 1 horizontal  in standing feet apart with plain background x 60 seconds (5/10); VOR x 1 horizontal in standing feet apart with busy background  60 seconds x 2 (6/10);   Head Turns Feet slightly apart performed horizontal and vertical head turns 1 minute each;; Airex surface horizontal ball passes (pt unable to perform on Airex pad due to imbalance); Firm surface horizontal ball passes x 1 minute with head/eye follow;  Side Stepping Performed side stepping in // bars without UE support x 2 lengths to improve single leg stance time and lateral stepping strategies;  Retro Ambulation Performed retro ambulation in // bars without UE support x 4 lengths with intermittent LOB requiring UE  support on bars and min/modA+1 from therapist to correct;  Toe Taps 6" toe taps alternating LE  x 10 each, pt able to perform without UE assist which is an improvement since prior sessions. She does experience intermittent LOB requiring minA and UE support to prevent posterior LOB;                             PT Education - 10/29/15 1037    Education provided Yes   Education Details HEP reinforced, attempt VOR vertical at home due to increased difficulty with vertical head turns.    Person(s) Educated Patient   Methods Explanation   Comprehension Verbalized understanding             PT Long Term Goals - 10/13/15 1709    PT LONG TERM GOAL #1   Title Pt will be independent with HEP in order to reduce fall risk, improve strength, and improve function at home   Time 8   Period Weeks   Status New   PT LONG TERM GOAL #2   Title Pt will improve BERG by at least 5 points in order to demonstrate significant improvement in balance and decrease fall risk   Baseline 10/12/15: 21/56   Time 8   Period Weeks   Status New   PT LONG TERM GOAL #3   Title Pt will improve 5TSTS by at least 2.3 seconds in order to demonstrates clinically significant improvement in LE strength and balance   Baseline 10/12/15: 18.0 seconds   Time 8   Period Weeks   Status New   PT LONG TERM GOAL #4   Title Pt will decrease DHI by at least 18 points in order to demonstrate clinically significant reduction in perception of handicap   Baseline 10/12/15: 52/100   Time 8   Period Weeks   Status New   PT LONG TERM GOAL #5   Title Pt will demonstrate increase in ABC by at least 13% in order to demonstrate significant improvement in balance confidence and decrease risk for falls   Baseline 10/12/15: 28.75%   Time 8   Period Weeks   Status New               Plan - 10/29/15 1037    Clinical Impression Statement Pt demonstrates improving balance during today's session especially in  single leg stance with toe taps. She continues to struggle considerably on all compliant surfaces such as Airex pad. Pt reports continued improvement in knee pain and denies any further pain during LE strengthening exercises on this date. Pt encouraged to attempt vertical VOR x 1 due to difficulty with balance during vertical head turns. Follow-up as scheduled.    Rehab Potential Fair   Clinical Impairments Affecting Rehab Potential Positive:  motivation, good safety awareness; Negative: chronicity   PT Frequency 2x / week   PT Duration 8 weeks   PT Treatment/Interventions ADLs/Self Care Home Management;Aquatic Therapy;Canalith Repostioning;Cryotherapy;Electrical Stimulation;Iontophoresis 4mg /ml Dexamethasone;Moist Heat;Traction;Ultrasound;DME Instruction;Gait training;Stair training;Functional mobility training;Therapeutic activities;Therapeutic exercise;Balance training;Neuromuscular re-education;Patient/family education;Manual techniques;Energy conservation;Vestibular   PT Next Visit Plan Continue leg press, advance hip strengthening, glut strengthening, continue head turning activities   PT Home Exercise Plan see pt. instructions, continuation of HEP. Attempt VOR x 1 vertical in sitting.    Consulted and Agree with Plan of Care Patient      Patient will benefit from skilled therapeutic intervention in order to improve the following deficits and impairments:  Abnormal gait, Decreased balance, Decreased mobility, Decreased strength, Difficulty walking, Dizziness  Visit Diagnosis: Difficulty in walking, not elsewhere classified  Muscle weakness (generalized)  Dizziness and giddiness     Problem List Patient Active Problem List   Diagnosis Date Noted  . Hypertension 12/04/2011  . Hyperlipidemia 12/04/2011  . Syncope 12/04/2011  . Diabetes mellitus ([place]) 12/04/2011  . H/O dizziness 12/04/2011  . Smoking hx 12/04/2011   Phillips Grout PT, DPT   Huprich,Jason 10/29/2015, 3:42  PM  [place] MAIN [facility] SERVICES [address] [place], [place], [address] Phone: [phone]   Fax:  [phone]  Name: Claire Washington MRN: [id number] Date of Birth: 1925-02-02

## 2015-11-02 ENCOUNTER — Ambulatory Visit: Payer: BLUE CROSS/BLUE SHIELD | Admitting: Physical Therapy

## 2015-11-02 ENCOUNTER — Ambulatory Visit: Payer: Medicare Other | Admitting: Physical Therapy

## 2015-11-02 VITALS — BP 141/43

## 2015-11-02 DIAGNOSIS — M6281 Muscle weakness (generalized): Secondary | ICD-10-CM

## 2015-11-02 DIAGNOSIS — R42 Dizziness and giddiness: Secondary | ICD-10-CM

## 2015-11-02 DIAGNOSIS — R262 Difficulty in walking, not elsewhere classified: Secondary | ICD-10-CM

## 2015-11-02 NOTE — Therapy (Signed)
[place] MAIN [facility] SERVICES [address] [place], [place], [address] Phone: [phone]   Fax:  [phone]  Physical Therapy Treatment  Patient Details  Name: Claire Washington MRN: [id number] Date of Birth: September 22, 1924 Referring Provider: Dr. Ginette Pitman  Encounter Date: 11/02/2015      PT End of Session - 11/02/15 1107    Visit Number 5   Number of Visits 17   Date for PT Re-Evaluation 13-Dec-2015   Authorization Type g codes every 10th visit/30 days   PT Start Time 1017   PT Stop Time 1057   PT Time Calculation (min) 40 min   Equipment Utilized During Treatment Gait belt   Activity Tolerance Patient tolerated treatment well   Behavior During Therapy [facility] for tasks assessed/performed      Past Medical History  Diagnosis Date  . Hypertension   . Osteoarthritis   . Hyperlipidemia   . Chronic cystitis   . Small vessel disease ([place])   . Leg pain   . Thyroid nodule   . Syncope and collapse 2013  . Vertigo   . Thyroid nodule   . Cancer ([place])     basel cell    Past Surgical History  Procedure Laterality Date  . Cystoscopy    . Colonoscopy    . Total abdominal hysterectomy    . Bilateral salpingoophorectomy      Filed Vitals:   11/02/15 1133  BP: 141/43  Cuff likely too large.      Subjective Assessment - 11/02/15 1106    Subjective pt states she has increased dizziness today and "doesn't know why." States her baseline today for dizziness is about 4/10.   Pertinent History Pt reports that she has been having problems with her balance for an extended period of time but progressively worsened recently. She also reports weakness with ascending stairs and finds herself needing to pull up on railings. She reports that she is very unsteady while descending stairs, ramps, and curbs. She uses a rollator for long distance community ambulation. For short distance community ambulation she uses a single point cane. At home she furniture  walks but does not use an assistive device. She has a history of dizziness and has participated in vestibular rehab in the past which was helpful. Pt unable to recall her vestibular diagnosis. Over the last 1.5 weeks she has started performing VOR x 1 horizontal exercise at home. She has not noticed any improvement in her dizziness since starting the exercise. She has one episode of syncope in the setting of GI issues/constipation in 2013. Pt denies falls in the last 12 months however states, "I am very careful." She reports that she has limited her activities due her balance/dizziness complaints.    Patient Stated Goals Improve balance and decrease risk for falls   Currently in Pain? No/denies      NMR: VOR 1 with plain background while sitting, horizontal head turns 2 x 1 min; increase dizziness to (5/10 during first round); CGA for balance; improved stability during 2nd round VOR 1 with plain background while sitting, vertical head 2 x 1 min; increase dizziness to 4/10; improved stability and c/o dizziness during 2nd round Attempted bil stance on firm surface with no UE support and EC but pt unable to perform due to significant unsteadiness. Bil staggered stance on firm surface with EO and no UE support, 5x30 sec holds each; CGA - min A for balance, increased difficulty with RLE back; increased posterior weight  shift throughout Bil staggered stance on firm surface with chest press and UE elevation with 1.5# weight bar, 1x10 each; CGA for balance, increased difficulty with RLE back Bil staggered stance with front foot on airex with balloon punches, 5x 30 sec each; min A throughout for balance; increased posterior weight shift  Alternating march with SLS (x3 sec hold) on third step, no UE support, x5 BLE; min A for balance, demonstrated increased posterior weight shift Sit <> stand from lowering seat height, x 10, x10, x5; decreased eccentric control as table level lowered      PT Education -  11/02/15 1107    Education provided Yes   Education Details exercise technique, proper weight shifting, HEP   Person(s) Educated Patient   Methods Explanation   Comprehension Verbalized understanding             PT Long Term Goals - 10/13/15 1709    PT LONG TERM GOAL #1   Title Pt will be independent with HEP in order to reduce fall risk, improve strength, and improve function at home   Time 8   Period Weeks   Status New   PT LONG TERM GOAL #2   Title Pt will improve BERG by at least 5 points in order to demonstrate significant improvement in balance and decrease fall risk   Baseline 10/12/15: 21/56   Time 8   Period Weeks   Status New   PT LONG TERM GOAL #3   Title Pt will improve 5TSTS by at least 2.3 seconds in order to demonstrates clinically significant improvement in LE strength and balance   Baseline 10/12/15: 18.0 seconds   Time 8   Period Weeks   Status New   PT LONG TERM GOAL #4   Title Pt will decrease DHI by at least 18 points in order to demonstrate clinically significant reduction in perception of handicap   Baseline 10/12/15: 52/100   Time 8   Period Weeks   Status New   PT LONG TERM GOAL #5   Title Pt will demonstrate increase in ABC by at least 13% in order to demonstrate significant improvement in balance confidence and decrease risk for falls   Baseline 10/12/15: 28.75%   Time 8   Period Weeks   Status New               Plan - 11/02/15 1108    Clinical Impression Statement pt continues to make progress towards goals as demonstrated by ability to participate in progressed dynamic balance exercises. SPT attempted VOR 1 in standing with plain background today but pt very unstable and almost immediately lost balance. Pt tolerated seated VOR better but still had slight unsteadiness during horizontal head turns. Pt demonstrates increased difficulty maintaining balance during staggered stance, RLE back > LLE, and required min A to maintain stability. Pt  needs continued skilled PT intervention to maximize overall function and decrease risk of falls.   Rehab Potential Fair   Clinical Impairments Affecting Rehab Potential Positive: motivation, good safety awareness; Negative: chronicity   PT Frequency 2x / week   PT Duration 8 weeks   PT Treatment/Interventions ADLs/Self Care Home Management;Aquatic Therapy;Canalith Repostioning;Cryotherapy;Electrical Stimulation;Iontophoresis 4mg /ml Dexamethasone;Moist Heat;Traction;Ultrasound;DME Instruction;Gait training;Stair training;Functional mobility training;Therapeutic activities;Therapeutic exercise;Balance training;Neuromuscular re-education;Patient/family education;Manual techniques;Energy conservation;Vestibular   PT Next Visit Plan Continue leg press, advance hip strengthening, glut strengthening, continue head turning activities   PT Home Exercise Plan see pt. instructions, continuation of HEP. Attempt VOR x 1 vertical in sitting.  Consulted and Agree with Plan of Care Patient      Patient will benefit from skilled therapeutic intervention in order to improve the following deficits and impairments:  Abnormal gait, Decreased balance, Decreased mobility, Decreased strength, Difficulty walking, Dizziness  Visit Diagnosis: Difficulty in walking, not elsewhere classified  Muscle weakness (generalized)  Dizziness and giddiness     Problem List Patient Active Problem List   Diagnosis Date Noted  . Hypertension 12/04/2011  . Hyperlipidemia 12/04/2011  . Syncope 12/04/2011  . Diabetes mellitus ([place]) 12/04/2011  . H/O dizziness 12/04/2011  . Smoking hx 12/04/2011   Geraldine Solar, SPT  This entire session was performed under direct supervision and direction of a licensed therapist/therapist assistant . I have personally read, edited and approve of the note as written.  Mindy Shiela Mayer, PT, DPT  11/02/2015, 11:36 AM [place] MAIN [facility]  SERVICES [address] [place], [place], [address] Phone: [phone]   Fax:  [phone]  Name: Racquelle Eales MRN: [id number] Date of Birth: 1925-01-19

## 2015-11-03 ENCOUNTER — Ambulatory Visit (INDEPENDENT_AMBULATORY_CARE_PROVIDER_SITE_OTHER): Payer: Medicare Other | Admitting: Cardiovascular Disease

## 2015-11-03 ENCOUNTER — Encounter: Payer: Self-pay | Admitting: Cardiovascular Disease

## 2015-11-03 VITALS — BP 112/58 | HR 58 | Ht 61.0 in | Wt 124.5 lb

## 2015-11-03 DIAGNOSIS — E119 Type 2 diabetes mellitus without complications: Secondary | ICD-10-CM | POA: Diagnosis not present

## 2015-11-03 DIAGNOSIS — E785 Hyperlipidemia, unspecified: Secondary | ICD-10-CM

## 2015-11-03 DIAGNOSIS — R42 Dizziness and giddiness: Secondary | ICD-10-CM | POA: Diagnosis not present

## 2015-11-03 DIAGNOSIS — I158 Other secondary hypertension: Secondary | ICD-10-CM

## 2015-11-03 MED ORDER — AMLODIPINE BESYLATE 5 MG PO TABS: 5.0000 mg | ORAL_TABLET | Freq: Every day | ORAL | Status: DC

## 2015-11-03 NOTE — Patient Instructions (Addendum)
Medication Instructions:   Please stay on the low dose crestor   Follow-Up: It was a pleasure seeing you in the office today. Please call us if you have new issues that need to be addressed before your next appt.  [phone]  Your physician wants you to follow-up in: 6 months.  You will receive a reminder letter in the mail two months in advance. If you don't receive a letter, please call our office to schedule the follow-up appointment.  If you need a refill on your cardiac medications before your next appointment, please call your pharmacy.

## 2015-11-03 NOTE — Progress Notes (Signed)
Patient ID: Claire Washington, female   DOB: 18-Apr-1924, 82 y.o.   MRN: [id number] Cardiology Office Note  Date:  11/03/2015   ID:  Claire Washington, DOB Nov 26, 1924, MRN [id number]  PCP:  Tracie Harrier, MD   Chief Complaint  Patient presents with  . other    6 month f/u. Meds reviewed verbally with pt.    HPI:  Claire Washington is a very pleasant 82 year-old woman with history of hypertension, chronic dizziness, benign positional vertigo, osteoarthritis, hyperlipidemia, carotid arterial disease, chronic cystitis, diabetes, who presents for routine followup of her dizziness, hypertension   Remote history of smoking until age 81 Prior history of poorly controlled blood pressure Episodes of syncope in the setting of GI issues/constipation, vasovagal episodes Less than 49% bilateral carotid arterial disease Sulfa allergy  In follow-up today she is working with physical therapy Feels her legs are getting stronger, still some dizziness in her head, gait instability Reports no significant lower extremity edema since she decrease the amlodipine dosing Blood pressure measurements reviewed over the past several weeks to months, typically 0000000 systolic, diastolic is low in the high 40s to 50 range  Overall feels well with no complaints She reports that she is tolerating Crestor 5 mg daily Drop in cholesterol down to 180 from 280  EKG on today's visit shows no sinus rhythm with rate 58 bpm, no significant ST or T-wave changes   Other past medical history pneumonia November 2015.   syncope 08/25/2011. She was in her kitchen preparing dinner and she had acute loss of consciousness, hit her head, left temple on the refrigerator. She had a subarachnoid hemorrhage noted on evaluation at [facility], transferred to [facility] for further evaluation.  On her second episode, she reports having a cocktail, milk of magnesia with constipation. She was discharged to rehabilitation. While at rehabilitation, she  was sitting on a bathroom commode, with constipation, had taken senna, she had lightheadedness and passed out for a brief period.   Previous Holter monitor and stress test several years ago at [facility].  Previous total cholesterol 270, LDL 177, HDL 60,   PMH:   has a past medical history of Hypertension; Osteoarthritis; Hyperlipidemia; Chronic cystitis; Small vessel disease ([place]); Leg pain; Thyroid nodule; Syncope and collapse (2013); Vertigo; Thyroid nodule; and Cancer ([place]).  PSH:    Past Surgical History  Procedure Laterality Date  . Cystoscopy    . Colonoscopy    . Total abdominal hysterectomy    . Bilateral salpingoophorectomy      Current Outpatient Prescriptions  Medication Sig Dispense Refill  . amLODipine (NORVASC) 5 MG tablet Take 1 tablet (5 mg total) by mouth daily. 30 tablet 11  . aspirin 81 MG tablet Take 81 mg by mouth daily.    Marland Kitchen losartan (COZAAR) 100 MG tablet Take 100 mg by mouth daily.    . metoprolol succinate (TOPROL-XL) 50 MG 24 hr tablet Take 50 mg in the am and 25 mg in the pm. Take with or immediately following a meal.    . Multiple Vitamins-Minerals (CENTRUM SILVER PO) Take by mouth daily. Reported on 06/11/2015    . Multiple Vitamins-Minerals (PRESERVISION AREDS PO) Take by mouth daily.    . rosuvastatin (CRESTOR) 5 MG tablet Take 5 mg by mouth daily.     No current facility-administered medications for this visit.     Allergies:   Hydrocortisone and Sulfa antibiotics   Social History:  The patient  reports that she has quit smoking. Her smoking use  included Cigarettes. She has a 12.5 pack-year smoking history. She has never used smokeless tobacco. She reports that she does not drink alcohol or use illicit drugs.   Family History:   family history includes Heart disease in her father.    Review of Systems: Review of Systems  Constitutional: Negative.   Respiratory: Negative.   Cardiovascular: Negative.   Gastrointestinal: Negative.    Musculoskeletal: Negative.   Neurological: Negative.   Psychiatric/Behavioral: Negative.   All other systems reviewed and are negative.    PHYSICAL EXAM: VS:  BP 112/58 mmHg  Pulse 58  Ht 5\' 1"  (1.549 m)  Wt 124 lb 8 oz (56.473 kg)  BMI 23.54 kg/m2 , BMI Body mass index is 23.54 kg/(m^2). GEN: Well nourished, well developed, in no acute distress HEENT: normal Neck: no JVD, carotid bruits, or masses Cardiac: RRR; no murmurs, rubs, or gallops,no edema  Respiratory:  clear to auscultation bilaterally, normal work of breathing GI: soft, nontender, nondistended, + BS MS: no deformity or atrophy Skin: warm and dry, no rash Neuro:  Strength and sensation are intact Psych: euthymic mood, full affect    Recent Labs: No results found for requested labs within last 365 days.    Lipid Panel No results found for: CHOL, HDL, LDLCALC, TRIG    Wt Readings from Last 3 Encounters:  11/03/15 124 lb 8 oz (56.473 kg)  06/11/15 126 lb 12.2 oz (57.5 kg)  05/06/15 130 lb 8 oz (59.194 kg)       ASSESSMENT AND PLAN:  Other secondary hypertension - Plan: EKG 12-Lead Blood pressure is well controlled on today's visit. No changes made to the medications.  Hyperlipidemia Tolerating Crestor 5 mg daily down to 180 on the cholesterol Some noncompliance, encourage her to take this every day  Type 2 diabetes mellitus without complication, without long-term current use of insulin ([place]) Managed by primary care, Closely watching her diet  Dizziness Rare dizziness, likely from vestibular issues or gait instability. Blood pressure stable, no orthostasis   Total encounter time more than 15 minutes  Greater than 50% was spent in counseling and coordination of care with the patient   Disposition:   F/U  6 months   Orders Placed This Encounter  Procedures  . EKG 12-Lead     Signed, Esmond Plants, M.D., Ph.D. 11/03/2015  [place], [place]

## 2015-11-08 ENCOUNTER — Ambulatory Visit: Payer: BLUE CROSS/BLUE SHIELD | Admitting: Physical Therapy

## 2015-11-09 ENCOUNTER — Ambulatory Visit: Payer: Medicare Other

## 2015-11-09 VITALS — BP 148/44 | HR 63

## 2015-11-09 DIAGNOSIS — R42 Dizziness and giddiness: Secondary | ICD-10-CM

## 2015-11-09 DIAGNOSIS — R262 Difficulty in walking, not elsewhere classified: Secondary | ICD-10-CM | POA: Diagnosis not present

## 2015-11-09 DIAGNOSIS — M6281 Muscle weakness (generalized): Secondary | ICD-10-CM

## 2015-11-09 NOTE — Therapy (Signed)
[place] MAIN [facility] SERVICES [address] [place], [place], [address] Phone: [phone]   Fax:  [phone]  Physical Therapy Treatment  Patient Details  Name: Claire Washington MRN: [id number] Date of Birth: March 09, 1925 Referring Provider: Dr. Ginette Pitman  Encounter Date: 11/09/2015      PT End of Session - 11/09/15 0909    Visit Number 6   Number of Visits 17   Date for PT Re-Evaluation 12/12/2015   Authorization Type g codes every 10th visit/30 days   PT Start Time 0901   PT Stop Time 0951   PT Time Calculation (min) 50 min   Equipment Utilized During Treatment Gait belt   Activity Tolerance Patient tolerated treatment well   Behavior During Therapy [facility] for tasks assessed/performed      Past Medical History:  Diagnosis Date  . Cancer ([place])    basel cell  . Chronic cystitis   . Hyperlipidemia   . Hypertension   . Leg pain   . Osteoarthritis   . Small vessel disease ([place])   . Syncope and collapse 2013  . Thyroid nodule   . Thyroid nodule   . Vertigo     Past Surgical History:  Procedure Laterality Date  . BILATERAL SALPINGOOPHORECTOMY    . COLONOSCOPY    . CYSTOSCOPY    . TOTAL ABDOMINAL HYSTERECTOMY      Vitals:   11/09/15 0904  BP: (!) 148/44  Pulse: 63  SpO2: 100%        Subjective Assessment - 11/09/15 0905    Subjective Pt reports that she is feeling well today. She had a lot of dizziness yesterday morning but it was improved this morning. She is starting to feel like it is coming back at this time and currently rates her dizziness as 3/10. No specific questions at this time. HEP is going well.   Pertinent History Pt reports that she has been having problems with her balance for an extended period of time but progressively worsened recently. She also reports weakness with ascending stairs and finds herself needing to pull up on railings. She reports that she is very unsteady while descending stairs, ramps, and  curbs. She uses a rollator for long distance community ambulation. For short distance community ambulation she uses a single point cane. At home she furniture walks but does not use an assistive device. She has a history of dizziness and has participated in vestibular rehab in the past which was helpful. Pt unable to recall her vestibular diagnosis. Over the last 1.5 weeks she has started performing VOR x 1 horizontal exercise at home. She has not noticed any improvement in her dizziness since starting the exercise. She has one episode of syncope in the setting of GI issues/constipation in 2013. Pt denies falls in the last 12 months however states, "I am very careful." She reports that she has limited her activities due her balance/dizziness complaints.    Patient Stated Goals Improve balance and decrease risk for falls   Currently in Pain? No/denies       Treatment   Ther-ex NuStep L1 x 5 minutes for warm-up during history (3 minutes unbilled); Quantum leg press 105# x 10, 120# x 10, 1325# x 10 reports mild L knee discomfort; Sit to stand from regular height without UE support 2 x 10, pt does not require cues for forward trunk lean on this date and demonstrates less fatigue today as well as improved eccentric control.  Neuromuscular Re-education   Baseline dizziness: 3/10   VOR VOR x 1 horizontal in standing feet apart with plain background 60 seconds x 2 (4/10); VOR x 1 vertical in standing feet apart with plain background 60 seconds x 2 (6/10); VOR x 2 horizontal in sitting x 60 seconds (pt denies increase in dizziness);   SAEBO Forward reaching and moving objects to varying heights, alternating UEs. Challenged forward reach and pt instructed to perform with head and eye follow while standing on firm surface. Multiple bouts. Repeated with 4" step in front alternating UE on step in order to challenge each leg individually;  BRAIDING Performed braiding in // bars with bilateral UE  support, single UE support, and 1-2 finger support x 4 laps, modA+1 support intermittently;  Head Turns Forward walking in gym with horizontal and vertical head turns for multiple laps, modA+1 support intermittently. Pt has most difficulty with vertical head turns;                          PT Education - 11/09/15 0909    Education provided Yes   Education Details HEP reinforced   Person(s) Educated Patient   Methods Explanation   Comprehension Verbalized understanding             PT Long Term Goals - 10/13/15 1709      PT LONG TERM GOAL #1   Title Pt will be independent with HEP in order to reduce fall risk, improve strength, and improve function at home   Time 8   Period Weeks   Status New     PT LONG TERM GOAL #2   Title Pt will improve BERG by at least 5 points in order to demonstrate significant improvement in balance and decrease fall risk   Baseline 10/12/15: 21/56   Time 8   Period Weeks   Status New     PT LONG TERM GOAL #3   Title Pt will improve 5TSTS by at least 2.3 seconds in order to demonstrates clinically significant improvement in LE strength and balance   Baseline 10/12/15: 18.0 seconds   Time 8   Period Weeks   Status New     PT LONG TERM GOAL #4   Title Pt will decrease DHI by at least 18 points in order to demonstrate clinically significant reduction in perception of handicap   Baseline 10/12/15: 52/100   Time 8   Period Weeks   Status New     PT LONG TERM GOAL #5   Title Pt will demonstrate increase in ABC by at least 13% in order to demonstrate significant improvement in balance confidence and decrease risk for falls   Baseline 10/12/15: 28.75%   Time 8   Period Weeks   Status New               Plan - 11/09/15 0911    Clinical Impression Statement Pt demonstrates increased LE strength with increased resistance during leg press and increased ease with sit to stand transfers from regular height chair. Technique  continues to improve with transfers with improved anterior weight shifting and increased stability. Pt reports no dizziness with VOR x 1 in sitting at home. She remains too unstable to perform in standing alone. Attempted VOR x 2 horizontal with patient in sitting however it also fails to illicit dizziness. At next session will need to repeat outcome measures and update G codes. Pt reports continued difficulty navigating curbs with spc.  Will attempt step-ups with spc at next session. Pt will continue to benefit from skilled PT services to address deficits in strength, balance, and dizziness.    Rehab Potential Fair   Clinical Impairments Affecting Rehab Potential Positive: motivation, good safety awareness; Negative: chronicity   PT Frequency 2x / week   PT Duration 8 weeks   PT Treatment/Interventions ADLs/Self Care Home Management;Aquatic Therapy;Canalith Repostioning;Cryotherapy;Electrical Stimulation;Iontophoresis 4mg /ml Dexamethasone;Moist Heat;Traction;Ultrasound;DME Instruction;Gait training;Stair training;Functional mobility training;Therapeutic activities;Therapeutic exercise;Balance training;Neuromuscular re-education;Patient/family education;Manual techniques;Energy conservation;Vestibular   PT Next Visit Plan Repeat outcome measures and update g codes. Continue leg press, advance hip strengthening, glut strengthening, continue head turning activities. Attempt step-ups with spc to simulate curbs (pt has dfficulty with curbs in community.    PT Home Exercise Plan VOR x 1 horizontal in sitting 60 seconds x 2, vertrical x 2, 4 times/day (not currently progressed to standing due to poor technique and instability), sit to stand without UE support from elevated chair, feet apart standing with horizontal and vertical head turns   Consulted and Agree with Plan of Care Patient      Patient will benefit from skilled therapeutic intervention in order to improve the following deficits and impairments:   Abnormal gait, Decreased balance, Decreased mobility, Decreased strength, Difficulty walking, Dizziness  Visit Diagnosis: Difficulty in walking, not elsewhere classified  Muscle weakness (generalized)  Dizziness and giddiness     Problem List Patient Active Problem List   Diagnosis Date Noted  . Dizziness 11/03/2015  . Hypertension 12/04/2011  . Hyperlipidemia 12/04/2011  . Syncope 12/04/2011  . Diabetes mellitus ([place]) 12/04/2011  . Smoking hx 12/04/2011   Phillips Grout PT, DPT   Traylon Schimming 11/09/2015, 10:12 AM  [place] MAIN [facility] SERVICES [address] [place], [place], [address] Phone: [phone]   Fax:  [phone]  Name: Berdena Miga MRN: [id number] Date of Birth: [birth year]

## 2015-11-12 ENCOUNTER — Encounter: Payer: Self-pay | Admitting: Physical Therapy

## 2015-11-12 ENCOUNTER — Ambulatory Visit: Payer: Medicare Other | Admitting: Physical Therapy

## 2015-11-12 DIAGNOSIS — R262 Difficulty in walking, not elsewhere classified: Secondary | ICD-10-CM | POA: Diagnosis not present

## 2015-11-12 DIAGNOSIS — M6281 Muscle weakness (generalized): Secondary | ICD-10-CM

## 2015-11-12 DIAGNOSIS — R42 Dizziness and giddiness: Secondary | ICD-10-CM

## 2015-11-12 NOTE — Therapy (Signed)
[place] MAIN [facility] SERVICES [address] [place], [place], [address] Phone: [phone]   Fax:  [phone]  Physical Therapy Treatment  Patient Details  Name: Claire Washington MRN: [id number] Date of Birth: 01-02-1925 Referring Provider: Dr. Ginette Pitman  Encounter Date: 11/12/2015      PT End of Session - 11/12/15 1019    Visit Number 7   Number of Visits 17   Date for PT Re-Evaluation December 09, 2015   Authorization Type g codes every 10th visit/30 days   PT Start Time 0848   PT Stop Time 0932   PT Time Calculation (min) 44 min   Equipment Utilized During Treatment Gait belt   Activity Tolerance Patient tolerated treatment well   Behavior During Therapy [facility] for tasks assessed/performed      Past Medical History:  Diagnosis Date  . Cancer ([place])    basel cell  . Chronic cystitis   . Hyperlipidemia   . Hypertension   . Leg pain   . Osteoarthritis   . Small vessel disease ([place])   . Syncope and collapse 2013  . Thyroid nodule   . Thyroid nodule   . Vertigo     Past Surgical History:  Procedure Laterality Date  . BILATERAL SALPINGOOPHORECTOMY    . COLONOSCOPY    . CYSTOSCOPY    . TOTAL ABDOMINAL HYSTERECTOMY      There were no vitals filed for this visit.      Subjective Assessment - 11/12/15 0852    Subjective Pt reports that she is feeling well. She had dizziness (5/10) with her VOR exercises yesterday and the dizziness continued afterwards; however she reported none this morning. Patient denies falls. Patient exercises regularly at home and states HEP is going well other than mild dizziness caused from them yesterday.    Pertinent History Pt reports that she has been having problems with her balance for an extended period of time but progressively worsened recently. She also reports weakness with ascending stairs and finds herself needing to pull up on railings. She reports that she is very unsteady while descending stairs,  ramps, and curbs. She uses a rollator for long distance community ambulation. For short distance community ambulation she uses a single point cane. At home she furniture walks but does not use an assistive device. She has a history of dizziness and has participated in vestibular rehab in the past which was helpful. Pt unable to recall her vestibular diagnosis. Over the last 1.5 weeks she has started performing VOR x 1 horizontal exercise at home. She has not noticed any improvement in her dizziness since starting the exercise. She has one episode of syncope in the setting of GI issues/constipation in 2013. Pt denies falls in the last 12 months however states, "I am very careful." She reports that she has limited her activities due her balance/dizziness complaints.    Patient Stated Goals Improve balance and decrease risk for falls   Currently in Pain? No/denies     Treatment:  NuStep, L2, BUE/LE x3 min (1 minutes unbilled, 2 minutes of history)  Neuromuscular Re-Ed: Firm surface Semi tandem stance, x15 seconds each LE leading, no HHA, very difficult, mod assist to maintain stability Feet apart, UE oblique twists with ball, 2x5, increase in dizziness to 3/10, min assist Narrow base walking in // bars, x4 min assist, for safety Marching, 2 finger support, 2x10, min assist  Static stance on airex feet apart (x1, x1 with UE flexion reaches), feet together (  x1), mod assist for stability especially feet together LE dot taps on firm surface, 1-2 finger tip support, x4 each LE, min assist for stability Cone reaches, no UE on firm surface, forward (x6) and sideways (x2 of 3 cones each UE) placing of cones (increased dizziness so d/c),  Min assist to assist patient when falling backwards  TherEx: Sit to stands, red weighted ball into shoulder flexion in stance,1 x5, 1x4, LE fatigue, min VCs to improve eccentric control when lowering  BLE leg press, 105#, 3 x10, min VCs for eccentric control, no pain,  patient noted that 120# was too heavy but 105# was too light Calf raises, 2 finger tip support, 2x10                               PT Education - 11/12/15 1018    Education provided Yes   Education Details sitting slowly with sit<>stands, ankle/hip strategies for balance   Person(s) Educated Patient   Methods Explanation;Verbal cues   Comprehension Verbalized understanding;Tactile cues required             PT Long Term Goals - 10/13/15 1709      PT LONG TERM GOAL #1   Title Pt will be independent with HEP in order to reduce fall risk, improve strength, and improve function at home   Time 8   Period Weeks   Status New     PT LONG TERM GOAL #2   Title Pt will improve BERG by at least 5 points in order to demonstrate significant improvement in balance and decrease fall risk   Baseline 10/12/15: 21/56   Time 8   Period Weeks   Status New     PT LONG TERM GOAL #3   Title Pt will improve 5TSTS by at least 2.3 seconds in order to demonstrates clinically significant improvement in LE strength and balance   Baseline 10/12/15: 18.0 seconds   Time 8   Period Weeks   Status New     PT LONG TERM GOAL #4   Title Pt will decrease DHI by at least 18 points in order to demonstrate clinically significant reduction in perception of handicap   Baseline 10/12/15: 52/100   Time 8   Period Weeks   Status New     PT LONG TERM GOAL #5   Title Pt will demonstrate increase in ABC by at least 13% in order to demonstrate significant improvement in balance confidence and decrease risk for falls   Baseline 10/12/15: 28.75%   Time 8   Period Weeks   Status New               Plan - 11/12/15 1020    Clinical Impression Statement Patient continues to make progress. She reports mild increase in dizziness to 3/10 with horizontal head turns during balance activities. When activities are discontinued dizziness subsides to 1/10, which she states is normal for her. Patient  has very poor static balance with large ballistic movements at moments when trying to maintain her balance. During static standing with UE movement patient has improved balance although with both completely static and UE movements during standing patient continues to have a tendency to fall posteriorly required mod-max assist in order to maintain stability. Patient would continue to benefit from skilled PT in order to address dizziness, strength, and balance.    Rehab Potential Fair   Clinical Impairments Affecting Rehab Potential Positive: motivation, good  safety awareness; Negative: chronicity   PT Frequency 2x / week   PT Duration 8 weeks   PT Treatment/Interventions ADLs/Self Care Home Management;Aquatic Therapy;Canalith Repostioning;Cryotherapy;Electrical Stimulation;Iontophoresis 4mg /ml Dexamethasone;Moist Heat;Traction;Ultrasound;DME Instruction;Gait training;Stair training;Functional mobility training;Therapeutic activities;Therapeutic exercise;Balance training;Neuromuscular re-education;Patient/family education;Manual techniques;Energy conservation;Vestibular   PT Next Visit Plan Repeat outcome measures and update g codes. Continue leg press, advance hip strengthening, glut strengthening, continue head turning activities. Attempt step-ups with spc to simulate curbs (pt has dfficulty with curbs in community.    PT Home Exercise Plan VOR x 1 horizontal in sitting 60 seconds x 2, vertrical x 2, 4 times/day (not currently progressed to standing due to poor technique and instability), sit to stand without UE support from elevated chair, feet apart standing with horizontal and vertical head turns   Consulted and Agree with Plan of Care Patient      Patient will benefit from skilled therapeutic intervention in order to improve the following deficits and impairments:  Abnormal gait, Decreased balance, Decreased mobility, Decreased strength, Difficulty walking, Dizziness  Visit Diagnosis: Difficulty  in walking, not elsewhere classified  Muscle weakness (generalized)  Dizziness and giddiness     Problem List Patient Active Problem List   Diagnosis Date Noted  . Dizziness 11/03/2015  . Hypertension 12/04/2011  . Hyperlipidemia 12/04/2011  . Syncope 12/04/2011  . Diabetes mellitus ([place]) 12/04/2011  . Smoking hx 12/04/2011   Tilman Neat, SPT This entire session was performed under direct supervision and direction of a licensed therapist/therapist assistant . I have personally read, edited and approve of the note as written.  Trotter,Margaret PT, DPT 11/12/2015, 10:46 AM  [place] MAIN [facility] SERVICES [place], [place], [address] Phone: [phone]   Fax:  [phone]  Name: Claire Washington MRN: [id number] Date of Birth: [birth year]

## 2015-11-15 ENCOUNTER — Ambulatory Visit: Payer: BLUE CROSS/BLUE SHIELD | Admitting: Physical Therapy

## 2015-11-16 ENCOUNTER — Encounter: Payer: Self-pay | Admitting: Physical Therapy

## 2015-11-16 ENCOUNTER — Ambulatory Visit: Payer: Medicare Other | Attending: Internal Medicine

## 2015-11-16 VITALS — BP 135/45 | HR 58

## 2015-11-16 DIAGNOSIS — M6281 Muscle weakness (generalized): Secondary | ICD-10-CM | POA: Diagnosis present

## 2015-11-16 DIAGNOSIS — R262 Difficulty in walking, not elsewhere classified: Secondary | ICD-10-CM | POA: Diagnosis not present

## 2015-11-16 DIAGNOSIS — R42 Dizziness and giddiness: Secondary | ICD-10-CM | POA: Diagnosis present

## 2015-11-16 NOTE — Therapy (Signed)
[place] MAIN [facility] SERVICES [address] [place], [place], [address] Phone: [phone]   Fax:  [phone]  Physical Therapy Treatment  Patient Details  Name: Claire Washington MRN: [id number] Date of Birth: Aug 19, 1924 Referring Provider: Dr. Ginette Pitman  Encounter Date: 11/16/2015      PT End of Session - 11/16/15 0934    Visit Number 8   Number of Visits 17   Date for PT Re-Evaluation 12/30/2015   Authorization Type g codes every 10th visit/30 days   PT Start Time 0900   PT Stop Time 0945   PT Time Calculation (min) 45 min   Equipment Utilized During Treatment Gait belt   Activity Tolerance Patient tolerated treatment well   Behavior During Therapy [facility] for tasks assessed/performed      Past Medical History:  Diagnosis Date  . Cancer ([place])    basel cell  . Chronic cystitis   . Hyperlipidemia   . Hypertension   . Leg pain   . Osteoarthritis   . Small vessel disease ([place])   . Syncope and collapse 2013  . Thyroid nodule   . Thyroid nodule   . Vertigo     Past Surgical History:  Procedure Laterality Date  . BILATERAL SALPINGOOPHORECTOMY    . COLONOSCOPY    . CYSTOSCOPY    . TOTAL ABDOMINAL HYSTERECTOMY      Vitals:   11/16/15 0905  BP: (!) 135/45  Pulse: (!) 58  SpO2: 100%        Subjective Assessment - 11/16/15 0903    Subjective Pt states she is doing well today. She denies any baseline dizziness upon arrival but states that she feels somewhat foggy this morning. She woke up at 4 AM and couldn't go back to sleep. Pt denies pain today. She is performing HEP and has no specific questions or concerns currently.    Pertinent History Pt reports that she has been having problems with her balance for an extended period of time but progressively worsened recently. She also reports weakness with ascending stairs and finds herself needing to pull up on railings. She reports that she is very unsteady while descending stairs,  ramps, and curbs. She uses a rollator for long distance community ambulation. For short distance community ambulation she uses a single point cane. At home she furniture walks but does not use an assistive device. She has a history of dizziness and has participated in vestibular rehab in the past which was helpful. Pt unable to recall her vestibular diagnosis. Over the last 1.5 weeks she has started performing VOR x 1 horizontal exercise at home. She has not noticed any improvement in her dizziness since starting the exercise. She has one episode of syncope in the setting of GI issues/constipation in 2013. Pt denies falls in the last 12 months however states, "I am very careful." She reports that she has limited her activities due her balance/dizziness complaints.    Patient Stated Goals Improve balance and decrease risk for falls   Currently in Pain? No/denies            [facility] PT Assessment - 11/16/15 [id number]      Observation/Other Assessments   Other Surveys  Other Surveys   Activities of Balance Confidence Scale (ABC Scale)  30%   Dizziness Handicap Inventory [facility])  48/100     Standardized Balance Assessment   Standardized Balance Assessment Berg Balance Test;Timed Up and Go Test;Five Times Sit to Stand;10 meter walk test  Five times sit to stand comments  12.4 seconds   10 Meter Walk 10.1 seconds with rollator = 0.99 m/s     Berg Balance Test   Sit to Stand Able to stand without using hands and stabilize independently   Standing Unsupported Able to stand safely 2 minutes   Sitting with Back Unsupported but Feet Supported on Floor or Stool Able to sit safely and securely 2 minutes   Stand to Sit Sits safely with minimal use of hands   Transfers Able to transfer safely, definite need of hands   Standing Unsupported with Eyes Closed Able to stand 3 seconds   Standing Ubsupported with Feet Together Able to place feet together independently but unable to hold for 30 seconds   From Standing,  Reach Forward with Outstretched Arm Can reach forward >5 cm safely (2")   From Standing Position, Pick up Object from Floor Able to pick up shoe, needs supervision   From Standing Position, Turn to Look Behind Over each Shoulder Turn sideways only but maintains balance   Turn 360 Degrees Needs close supervision or verbal cueing   Standing Unsupported, Alternately Place Feet on Step/Stool Able to complete 4 steps without aid or supervision   Standing Unsupported, One Foot in Front Able to take small step independently and hold 30 seconds   Standing on One Leg Tries to lift leg/unable to hold 3 seconds but remains standing independently   Total Score 36     Timed Up and Go Test   TUG Normal TUG   Normal TUG (seconds) 15.1        Treatment:  PHYSICAL PERFORMANCE BERG, TUG, 38m gait speed, and 5TSTS performed with patient; DHI and ABC results discussed with patient; Updated goals and discussed plan of care patient;   NEUROMUSCULAR RE-EDUCATION Firm toe taps to 6" step x 10 bilateral without UE support; Airex toe taps to 6" step x 5 bilateral; Step-ups to 6" step with unilateral support on spc to simulate ascending/descend curbs alternating LE x 5 each;  THER-EX BLE Quantum leg press, 105# x 10, 110# x 10, 120# x 10, min VCs for rest breaks. At end of 120# pt reports some mild L knee pain;                      PT Education - 11/16/15 0933    Education provided Yes   Education Details Reinforced HEP, discussed plan of care, outcome measures and goals discussed   Person(s) Educated Patient   Methods Explanation;Demonstration   Comprehension Verbalized understanding;Returned demonstration             PT Long Term Goals - 11/16/15 1319      PT LONG TERM GOAL #1   Title Pt will be independent with HEP in order to reduce fall risk, improve strength, and improve function at home   Time 8   Period Weeks   Status On-going     PT LONG TERM GOAL #2   Title Pt  will improve BERG by at least 5 points in order to demonstrate significant improvement in balance and decrease fall risk   Baseline 10/12/15: 21/56, 11/16/15: 36/56   Time 8   Period Weeks   Status Revised     PT LONG TERM GOAL #3   Title Pt will improve 5TSTS by at least 2.3 seconds in order to demonstrates clinically significant improvement in LE strength and balance   Baseline 10/12/15: 18.0 seconds, 11/16/15: 12.4 seconds  Time 8   Period Weeks   Status Achieved     PT LONG TERM GOAL #4   Title Pt will decrease DHI by at least 18 points in order to demonstrate clinically significant reduction in perception of handicap   Baseline 10/12/15: 52/100, December 06, 2015: 48/100   Time 8   Period Weeks   Status On-going     PT LONG TERM GOAL #5   Title Pt will demonstrate increase in ABC by at least 13% in order to demonstrate significant improvement in balance confidence and decrease risk for falls   Baseline 10/12/15: 28.75%, 12/06/15: 30%   Time 8   Period Weeks   Status On-going               Plan - 2015-12-06 0935    Clinical Impression Statement Pt demonstrates improvement with her balance and LE strength as demonstrated by improvement in her 5TSTS, BERG, and TUG scores. Gait speed is essentially unchanged since the initial evaluation but was never an issue for the patient. ABC and DHI surverys are also essentially unchanged since initial evaluation. Pt able to continue to increase the resistance with leg press on today's visit. Spent additional time with patient today educating her about progress as well as plan of care moving forward. Pt will continue to benefit from skilled PT services to address deficits in strength and balance in order to improve function at home and decrease risk for falls.    Rehab Potential Fair   Clinical Impairments Affecting Rehab Potential Positive: motivation, good safety awareness; Negative: chronicity   PT Frequency 2x / week   PT Duration 8 weeks   PT  Treatment/Interventions ADLs/Self Care Home Management;Aquatic Therapy;Canalith Repostioning;Cryotherapy;Electrical Stimulation;Iontophoresis 4mg /ml Dexamethasone;Moist Heat;Traction;Ultrasound;DME Instruction;Gait training;Stair training;Functional mobility training;Therapeutic activities;Therapeutic exercise;Balance training;Neuromuscular re-education;Patient/family education;Manual techniques;Energy conservation;Vestibular   PT Next Visit Plan Continue leg press, advance hip strengthening, glut strengthening, continue head turning activities. Attempt step-ups with spc to simulate curbs (pt has dfficulty with curbs in community). Consider alternative assistive devices for patient which may help with curbs   PT Home Exercise Plan VOR x 1 horizontal in sitting 60 seconds x 2, vertrical x 2, 4 times/day (not currently progressed to standing due to poor technique and instability), sit to stand without UE support from elevated chair, feet apart standing with horizontal and vertical head turns   Consulted and Agree with Plan of Care Patient      Patient will benefit from skilled therapeutic intervention in order to improve the following deficits and impairments:  Abnormal gait, Decreased balance, Decreased mobility, Decreased strength, Difficulty walking, Dizziness  Visit Diagnosis: Difficulty in walking, not elsewhere classified  Muscle weakness (generalized)  Dizziness and giddiness       G-Codes - 06-Dec-2015 1323    Functional Assessment Tool Used clinical judgement, DHI, BERG, ABC, 28m gait speed, TUG, 5TSTS   Functional Limitation Mobility: Walking and moving around   Mobility: Walking and Moving Around Current Status [phone]) At least 40 percent but less than 60 percent impaired, limited or restricted   Mobility: Walking and Moving Around Goal Status [phone]) At least 20 percent but less than 40 percent impaired, limited or restricted      Problem List Patient Active Problem List    Diagnosis Date Noted  . Dizziness 11/03/2015  . Hypertension 12/04/2011  . Hyperlipidemia 12/04/2011  . Syncope 12/04/2011  . Diabetes mellitus ([place]) 12/04/2011  . Smoking hx 12/04/2011   Lyndel Safe Wilbur [place] PT, DPT   Naevia Unterreiner 2015/12/06, 1:29 PM  [place] MAIN [facility] SERVICES [address] [place], [place], [address] Phone: [phone]   Fax:  [phone]  Name: Claire Washington MRN: [id number] Date of Birth: 1925-03-02

## 2015-11-16 NOTE — Progress Notes (Signed)
Treatment:  Repeat outcome measures: BERG, 5TSTS, DHI, ABC  NuStep, L2, BUE/LE x3 min (1 minutes unbilled, 2 minutes of history)  Neuromuscular Re-Ed: Firm surface Semi tandem stance, x15 seconds each LE leading, no HHA, very difficult, mod assist to maintain stability Feet apart, UE oblique twists with ball, 2x5, increase in dizziness to 3/10, min assist Narrow base walking in // bars, x4 min assist, for safety Marching, 2 finger support, 2x10, min assist  Static stance on airex feet apart (x1, x1 with UE flexion reaches), feet together (x1), mod assist for stability especially feet together LE dot taps on firm surface, 1-2 finger tip support, x4 each LE, min assist for stability Cone reaches, no UE on firm surface, forward (x6) and sideways (x2 of 3 cones each UE) placing of cones (increased dizziness so d/c),  Min assist to assist patient when falling backwards  TherEx: Sit to stands, red weighted ball into shoulder flexion in stance,1 x5, 1x4, LE fatigue, min VCs to improve eccentric control when lowering  BLE leg press, 105#, 3 x10, min VCs for eccentric control, no pain, patient noted that 120# was too heavy but 105# was too light Calf raises, 2 finger tip support, 2x10

## 2015-11-18 ENCOUNTER — Encounter: Payer: Self-pay | Admitting: Physical Therapy

## 2015-11-18 ENCOUNTER — Ambulatory Visit: Payer: Medicare Other | Admitting: Physical Therapy

## 2015-11-18 DIAGNOSIS — R262 Difficulty in walking, not elsewhere classified: Secondary | ICD-10-CM

## 2015-11-18 DIAGNOSIS — M6281 Muscle weakness (generalized): Secondary | ICD-10-CM

## 2015-11-18 DIAGNOSIS — R42 Dizziness and giddiness: Secondary | ICD-10-CM

## 2015-11-18 NOTE — Therapy (Signed)
[place] MAIN [facility] SERVICES [address] [place], [place], [address] Phone: [phone]   Fax:  [phone]  Physical Therapy Treatment  Patient Details  Name: Claire Washington MRN: [id number] Date of Birth: [birth year] Referring Provider: Dr. Ginette Pitman  Encounter Date: 11/18/2015      PT End of Session - 11/18/15 1102    Visit Number 9   Number of Visits 17   Date for PT Re-Evaluation 12/23/2015   Authorization Type g codes every 10th visit/30 days   PT Start Time 1015   PT Stop Time 1100   PT Time Calculation (min) 45 min   Equipment Utilized During Treatment Gait belt   Activity Tolerance Patient tolerated treatment well   Behavior During Therapy [facility] for tasks assessed/performed      Past Medical History:  Diagnosis Date  . Cancer ([place])    basel cell  . Chronic cystitis   . Hyperlipidemia   . Hypertension   . Leg pain   . Osteoarthritis   . Small vessel disease ([place])   . Syncope and collapse 2013  . Thyroid nodule   . Thyroid nodule   . Vertigo     Past Surgical History:  Procedure Laterality Date  . BILATERAL SALPINGOOPHORECTOMY    . COLONOSCOPY    . CYSTOSCOPY    . TOTAL ABDOMINAL HYSTERECTOMY      There were no vitals filed for this visit.      Subjective Assessment - 11/18/15 1018    Subjective Pt reports she had an "active" morning today getting the guest room set up for her family to visit.  She reports no dizziness this morning but 3/10 L knee pain that she believes may have been aggravated by the leg press or an exercise that she did at home since last session.       Pertinent History Pt reports that she has been having problems with her balance for an extended period of time but progressively worsened recently. She also reports weakness with ascending stairs and finds herself needing to pull up on railings. She reports that she is very unsteady while descending stairs, ramps, and curbs. She uses a rollator for  long distance community ambulation. For short distance community ambulation she uses a single point cane. At home she furniture walks but does not use an assistive device. She has a history of dizziness and has participated in vestibular rehab in the past which was helpful. Pt unable to recall her vestibular diagnosis. Over the last 1.5 weeks she has started performing VOR x 1 horizontal exercise at home. She has not noticed any improvement in her dizziness since starting the exercise. She has one episode of syncope in the setting of GI issues/constipation in 2013. Pt denies falls in the last 12 months however states, "I am very careful." She reports that she has limited her activities due her balance/dizziness complaints.    Patient Stated Goals Improve balance and decrease risk for falls   Currently in Pain? Yes   Pain Score 3    Pain Location Knee   Pain Orientation Left   Pain Descriptors / Indicators Aching      Treatment Nustep x 4 mins on level 2, BUEs/BLEs (unbilled)  Hip flexion/abduction/extension with red theraband resistance, 1 set x 10 reps BLEs, min VCs for knee extension throughout and upright posture Dot taps forwards/sidways/backwards, 1 set x 10 reps in both directions with BLEs, min VCs for decreased HHA  Bosu step  taps, 3 sets x 10 reps, 2 finger HHA for stability, mild LOB corrected by therapist SAQs with 2 lb weight, 2 sets x 10 reps, BLEs, min VCs for isometric hold and eccentric lowering Step ups on 6 inch step with 1 HHA, 1 set x 10 reps, min VCs for upright posture  Lateral step ups with RLE, 1 set x 10 reps, 2 HHA, min Vcs to weight bear through RLE and use UEs less  Bridges, clamshells, and lateral step ups with LLE deferred due to aggravating L knee pain  Educated to continue with HEP and hold off on exercises that worsen L knee pain until next visit                            PT Education - 11/18/15 1102    Education provided Yes    Education Details educated on HEP and to avoid exercises at home that increase knee pain    Person(s) Educated Patient   Methods Explanation;Demonstration;Verbal cues   Comprehension Verbalized understanding;Returned demonstration;Verbal cues required             PT Long Term Goals - 11/16/15 1319      PT LONG TERM GOAL #1   Title Pt will be independent with HEP in order to reduce fall risk, improve strength, and improve function at home   Time 8   Period Weeks   Status On-going     PT LONG TERM GOAL #2   Title Pt will improve BERG by at least 5 points in order to demonstrate significant improvement in balance and decrease fall risk   Baseline 10/12/15: 21/56, 11/16/15: 36/56   Time 8   Period Weeks   Status Revised     PT LONG TERM GOAL #3   Title Pt will improve 5TSTS by at least 2.3 seconds in order to demonstrates clinically significant improvement in LE strength and balance   Baseline 10/12/15: 18.0 seconds, 11/16/15: 12.4 seconds   Time 8   Period Weeks   Status Achieved     PT LONG TERM GOAL #4   Title Pt will decrease DHI by at least 18 points in order to demonstrate clinically significant reduction in perception of handicap   Baseline 10/12/15: 52/100, 11/16/15: 48/100   Time 8   Period Weeks   Status On-going     PT LONG TERM GOAL #5   Title Pt will demonstrate increase in ABC by at least 13% in order to demonstrate significant improvement in balance confidence and decrease risk for falls   Baseline 10/12/15: 28.75%, 11/16/15: 30%   Time 8   Period Weeks   Status On-going               Plan - 11/18/15 1104    Clinical Impression Statement Pt continues to make improvement in her balance.  Today her knee was aggravated coming into therapy which limited her ability to perform certain quad and hip strengthening exercises.  Pt advanced dynamic balance by performing dot taps forward, laterally and backwards as well as step taps on bosu ball with 2 finger assist.  Pt  continues to demonstrate posterior lean and LOB attempting balance activites without use of UEs.  Pt performed step ups with 1HHA and 4 way hip exercise with red theraband restiance with no notable increase in knee pain.  She would continue to benefit from further skilled PT services to address her dyanmic balance on noncomplaint surfaces  without UE support and LE strength to decrease falls risk.     Rehab Potential Fair   Clinical Impairments Affecting Rehab Potential Positive: motivation, good safety awareness; Negative: chronicity   PT Frequency 2x / week   PT Duration 8 weeks   PT Treatment/Interventions ADLs/Self Care Home Management;Aquatic Therapy;Canalith Repostioning;Cryotherapy;Electrical Stimulation;Iontophoresis 4mg /ml Dexamethasone;Moist Heat;Traction;Ultrasound;DME Instruction;Gait training;Stair training;Functional mobility training;Therapeutic activities;Therapeutic exercise;Balance training;Neuromuscular re-education;Patient/family education;Manual techniques;Energy conservation;Vestibular   PT Next Visit Plan attempt leg press again, stry step ups with cane for curb training, glut strengthening, hip strengthening    PT Home Exercise Plan VOR x 1 horizontal in sitting 60 seconds x 2, vertrical x 2, 4 times/day (not currently progressed to standing due to poor technique and instability), sit to stand without UE support from elevated chair, feet apart standing with horizontal and vertical head turns   Consulted and Agree with Plan of Care Patient      Patient will benefit from skilled therapeutic intervention in order to improve the following deficits and impairments:  Abnormal gait, Decreased balance, Decreased mobility, Decreased strength, Difficulty walking, Dizziness  Visit Diagnosis: Difficulty in walking, not elsewhere classified  Muscle weakness (generalized)  Dizziness and giddiness     Problem List Patient Active Problem List   Diagnosis Date Noted  . Dizziness  11/03/2015  . Hypertension 12/04/2011  . Hyperlipidemia 12/04/2011  . Syncope 12/04/2011  . Diabetes mellitus ([place]) 12/04/2011  . Smoking hx 12/04/2011   Stacy Gardner, SPT  This entire session was performed under direct supervision and direction of a licensed therapist/therapist assistant . I have personally read, edited and approve of the note as written.  Trotter,Margaret PT, DPT 11/18/2015, 11:41 AM  [place] MAIN [facility] SERVICES [address] [place], [place], [address] Phone: [phone]   Fax:  [phone]  Name: Ovie Romanos MRN: [id number] Date of Birth: 1924-05-24

## 2015-11-23 ENCOUNTER — Encounter: Payer: Self-pay | Admitting: Physical Therapy

## 2015-11-23 ENCOUNTER — Ambulatory Visit: Payer: Medicare Other | Admitting: Physical Therapy

## 2015-11-23 VITALS — BP 143/50

## 2015-11-23 DIAGNOSIS — R262 Difficulty in walking, not elsewhere classified: Secondary | ICD-10-CM | POA: Diagnosis not present

## 2015-11-23 DIAGNOSIS — M6281 Muscle weakness (generalized): Secondary | ICD-10-CM

## 2015-11-23 NOTE — Therapy (Signed)
[place] MAIN [facility] SERVICES [address] [place], [place], [address] Phone: [phone]   Fax:  [phone]  Physical Therapy Treatment  Patient Details  Name: Claire Washington MRN: [id number] Date of Birth: 1924/08/21 Referring Provider: Dr. Ginette Pitman  Encounter Date: 11/23/2015      PT End of Session - 11/23/15 1553    Visit Number 10   Number of Visits 17   Date for PT Re-Evaluation 12/23/15   Authorization Type G codes every 10th visit; G Code visit #2/10   PT Start Time 1016   PT Stop Time 1102   PT Time Calculation (min) 46 min   Equipment Utilized During Treatment Gait belt   Activity Tolerance Patient tolerated treatment well   Behavior During Therapy [facility] for tasks assessed/performed      Past Medical History:  Diagnosis Date  . Cancer ([place])    basel cell  . Chronic cystitis   . Hyperlipidemia   . Hypertension   . Leg pain   . Osteoarthritis   . Small vessel disease ([place])   . Syncope and collapse 2013  . Thyroid nodule   . Thyroid nodule   . Vertigo     Past Surgical History:  Procedure Laterality Date  . BILATERAL SALPINGOOPHORECTOMY    . COLONOSCOPY    . CYSTOSCOPY    . TOTAL ABDOMINAL HYSTERECTOMY      Vitals:   11/23/15 1021  BP: (!) 143/50        Subjective Assessment - 11/23/15 1019    Subjective Patient states that she had family visiting this past weekend and that tired her out. Pt states her knee is not bothering her today. Pt reports her balance is still not good and states one of her major problems is that she is having trouble getting up and down curbs even with the use of her cane.    Pertinent History Pt reports that she has been having problems with her balance for an extended period of time but progressively worsened recently. She also reports weakness with ascending stairs and finds herself needing to pull up on railings. She reports that she is very unsteady while descending stairs, ramps,  and curbs. She uses a rollator for long distance community ambulation. For short distance community ambulation she uses a single point cane. At home she furniture walks but does not use an assistive device. She has a history of dizziness and has participated in vestibular rehab in the past which was helpful. Pt unable to recall her vestibular diagnosis. Over the last 1.5 weeks she has started performing VOR x 1 horizontal exercise at home. She has not noticed any improvement in her dizziness since starting the exercise. She has one episode of syncope in the setting of GI issues/constipation in 2013. Pt denies falls in the last 12 months however states, "I am very careful." She reports that she has limited her activities due her balance/dizziness complaints.    Currently in Pain? No/denies      Neuromuscular Re-education: Time spent discussing current plan of care and discussing patient goals and what patient perceives to be her biggest areas of difficulty.   Step Ups: Patient identified that stepping up onto and off curbs is a major difficulty for her in her daily life. Therefore attempted stepping up on 6" wooden step with SPC without use of railing for curb training and patient performed 2-3 reps but was unsteady and demonstrated difficulty with concentric/eccentric ascent and descent on  the step as well as imbalance. Therefore switched to 5" wooden step.  Performed step up and back down on 5" wooden step with SPC 10 reps with vc for weight shifting and foot placement and sequencing initially and then with two fingers support of right hand on // bar about 6 reps with CGA to [place]. Pt fatigues quickly with this activity and demonstrates difficulty with eccentric and concentric muscle control with ascending/descending step. Pt with numerous losses of balance several that she was able to self-correct and several that required Min A to correct.  Performed step up, over and backward return on 5" wooden step  with SPC 5 reps, approximately 10 reps with CGA to Min A.    Cone tapping: On firm surface, pt attempted to perform alternate foot cone tapping however patient had difficulty maintaining balance and lifting foot high enough to tap top of cone. Therefore, pt performed foot tapping on balance pods with intermittent few finger support alternating support leg after every 4-8 taps. Pt required CGA with this activity.  THER-EX: BLE Quantum leg press: 110# x 15 times 3 reps, min VCs for rest breaks. Pt reports some brief, mild L knee pain towards end of set that eased upon stopping.       PT Education - 11/23/15 1552    Education provided Yes   Education Details discussed POC; discussed how to perform HEP in corner for safety    Person(s) Educated Patient   Methods Explanation;Demonstration;Verbal cues   Comprehension Verbalized understanding;Returned demonstration             PT Long Term Goals - 11/16/15 1319      PT LONG TERM GOAL #1   Title Pt will be independent with HEP in order to reduce fall risk, improve strength, and improve function at home   Time 8   Period Weeks   Status On-going     PT LONG TERM GOAL #2   Title Pt will improve BERG by at least 5 points in order to demonstrate significant improvement in balance and decrease fall risk   Baseline 10/12/15: 21/56, 11/16/15: 36/56   Time 8   Period Weeks   Status Revised     PT LONG TERM GOAL #3   Title Pt will improve 5TSTS by at least 2.3 seconds in order to demonstrates clinically significant improvement in LE strength and balance   Baseline 10/12/15: 18.0 seconds, 11/16/15: 12.4 seconds   Time 8   Period Weeks   Status Achieved     PT LONG TERM GOAL #4   Title Pt will decrease DHI by at least 18 points in order to demonstrate clinically significant reduction in perception of handicap   Baseline 10/12/15: 52/100, 11/16/15: 48/100   Time 8   Period Weeks   Status On-going     PT LONG TERM GOAL #5   Title Pt will  demonstrate increase in ABC by at least 13% in order to demonstrate significant improvement in balance confidence and decrease risk for falls   Baseline 10/12/15: 28.75%, 11/16/15: 30%   Time 8   Period Weeks   Status On-going               Plan - 11/23/15 1626    Clinical Impression Statement Pt continues to demonstrate posterior lean and LOB attempting balance activites despite use of SPC. Patient is challenged by narrow BOS and single leg stance activities and demonstrates imbalance with step ups and step down activities. Pt would  benefit from continued PT services to address deficits, goals and to try to reduce patient's falls risk.     Rehab Potential Fair   Clinical Impairments Affecting Rehab Potential Positive: motivation, good safety awareness; Negative: chronicity   PT Frequency 2x / week   PT Duration 8 weeks   PT Treatment/Interventions ADLs/Self Care Home Management;Aquatic Therapy;Canalith Repostioning;Cryotherapy;Electrical Stimulation;Iontophoresis 4mg /ml Dexamethasone;Moist Heat;Traction;Ultrasound;DME Instruction;Gait training;Stair training;Functional mobility training;Therapeutic activities;Therapeutic exercise;Balance training;Neuromuscular re-education;Patient/family education;Manual techniques;Energy conservation;Vestibular   PT Next Visit Plan Continue working on step ups with cane for curb training, glut strengthening, hip strengthening    PT Home Exercise Plan VOR x 1 horizontal in sitting 60 seconds x 2, vertrical x 2, 4 times/day (not currently progressed to standing due to poor technique and instability), sit to stand without UE support from elevated chair, feet apart standing with horizontal and vertical head turns   Consulted and Agree with Plan of Care Patient      Patient will benefit from skilled therapeutic intervention in order to improve the following deficits and impairments:  Abnormal gait, Decreased balance, Decreased mobility, Decreased strength,  Difficulty walking, Dizziness  Visit Diagnosis: Difficulty in walking, not elsewhere classified  Muscle weakness (generalized)     Problem List Patient Active Problem List   Diagnosis Date Noted  . Dizziness 11/03/2015  . Hypertension 12/04/2011  . Hyperlipidemia 12/04/2011  . Syncope 12/04/2011  . Diabetes mellitus ([place]) 12/04/2011  . Smoking hx 12/04/2011   Lady Deutscher PT, DPT Lady Deutscher 11/24/2015, 12:31 PM  [place] MAIN [facility] SERVICES [address] [place], [place], [address] Phone: [phone]   Fax:  [phone]  Name: Claire Washington MRN: [id number] Date of Birth: [birth year]

## 2015-11-25 ENCOUNTER — Ambulatory Visit: Payer: Medicare Other | Admitting: Physical Therapy

## 2015-11-25 ENCOUNTER — Encounter: Payer: Self-pay | Admitting: Physical Therapy

## 2015-11-25 DIAGNOSIS — R42 Dizziness and giddiness: Secondary | ICD-10-CM

## 2015-11-25 DIAGNOSIS — R262 Difficulty in walking, not elsewhere classified: Secondary | ICD-10-CM

## 2015-11-25 DIAGNOSIS — M6281 Muscle weakness (generalized): Secondary | ICD-10-CM

## 2015-11-25 NOTE — Patient Instructions (Addendum)
   Abduction: Clam (Eccentric) - Side-Lying    Lie on side with knees bent. Lift top knee, keeping feet together. Keep trunk steady. Slowly lower for 3-5 seconds. __10_ reps per set, _2__ sets per day, _5__ days per week. Perform on each side   http://ecce.exer.us/65   Copyright  VHI. All rights reserved.

## 2015-11-25 NOTE — Therapy (Signed)
[place] MAIN [facility] SERVICES [address] [place], [place], [address] Phone: [phone]   Fax:  [phone]  Physical Therapy Treatment  Patient Details  Name: Claire Washington MRN: [id number] Date of Birth: [birth year] Referring Provider: Dr. Ginette Pitman  Encounter Date: 11/25/2015      PT End of Session - 11/25/15 1251    Visit Number 11   Number of Visits 17   Date for PT Re-Evaluation 01-01-2016   Authorization Type G codes every 10th visit; G Code visit #3/10   PT Start Time 1030   PT Stop Time 1115   PT Time Calculation (min) 45 min   Equipment Utilized During Treatment Gait belt   Activity Tolerance Patient tolerated treatment well   Behavior During Therapy [facility] for tasks assessed/performed      Past Medical History:  Diagnosis Date  . Cancer ([place])    basel cell  . Chronic cystitis   . Hyperlipidemia   . Hypertension   . Leg pain   . Osteoarthritis   . Small vessel disease ([place])   . Syncope and collapse 2013  . Thyroid nodule   . Thyroid nodule   . Vertigo     Past Surgical History:  Procedure Laterality Date  . BILATERAL SALPINGOOPHORECTOMY    . COLONOSCOPY    . CYSTOSCOPY    . TOTAL ABDOMINAL HYSTERECTOMY      There were no vitals filed for this visit.      Subjective Assessment - 11/25/15 1033    Subjective Pt states that she had a good weekend visiting family and that she has been tired from all the company.  She reports her knees are feeling better today and she has only felt dizzy when she stoops to pick up objects off the floor.     Pertinent History Pt reports that she has been having problems with her balance for an extended period of time but progressively worsened recently. She also reports weakness with ascending stairs and finds herself needing to pull up on railings. She reports that she is very unsteady while descending stairs, ramps, and curbs. She uses a rollator for long distance community  ambulation. For short distance community ambulation she uses a single point cane. At home she furniture walks but does not use an assistive device. She has a history of dizziness and has participated in vestibular rehab in the past which was helpful. Pt unable to recall her vestibular diagnosis. Over the last 1.5 weeks she has started performing VOR x 1 horizontal exercise at home. She has not noticed any improvement in her dizziness since starting the exercise. She has one episode of syncope in the setting of GI issues/constipation in 2013. Pt denies falls in the last 12 months however states, "I am very careful." She reports that she has limited her activities due her balance/dizziness complaints.    Currently in Pain? No/denies      Treatment Nustep x 4 mins BUEs/BLEs level 0  (unbilled)  Curb training with 4 inch step using SPC per home environment, pt instructed to try with cane in R hand and step up with LLE, due to balance on LLE patient felt more comfortable and more stable stepping up with RLE and down with LLE , 2 sets x 10 reps (15 mins gait training).  Pt prefers to step up and down leading with RLE, requires min A to stabilize and prevent backwards lean.  Instructed to not perform at home alone right  now due to instability.   Bosu step taps with 2 finger assist, 2 sets x 10 reps alternating LEs, pt required CGA/Min A for stability, 2 LOB posteriorly self corrected by therapist  Plantargrade hip extension leaning against mat table, 2 sets x 15 reps alternating LEs, 2 second isometric hold, min VCS for increased glut activation  Sidelying clamshells, 3 sets x 10 reps, min tactilce cues for techinque, min VCs to stop motion before increase in knee pain  Sit to stands with red weighted ball overhead press, 1 set x 10 reps, min VCS for upright posture and increased glut activation   Added sidelying clamshells into HEP                           PT Education - 11/25/15 1250     Education provided Yes   Education Details curb training, addition of HEP    Person(s) Educated Patient   Methods Explanation;Demonstration;Verbal cues   Comprehension Verbalized understanding;Returned demonstration;Verbal cues required             PT Long Term Goals - 11/16/15 1319      PT LONG TERM GOAL #1   Title Pt will be independent with HEP in order to reduce fall risk, improve strength, and improve function at home   Time 8   Period Weeks   Status On-going     PT LONG TERM GOAL #2   Title Pt will improve BERG by at least 5 points in order to demonstrate significant improvement in balance and decrease fall risk   Baseline 10/12/15: 21/56, 11/16/15: 36/56   Time 8   Period Weeks   Status Revised     PT LONG TERM GOAL #3   Title Pt will improve 5TSTS by at least 2.3 seconds in order to demonstrates clinically significant improvement in LE strength and balance   Baseline 10/12/15: 18.0 seconds, 11/16/15: 12.4 seconds   Time 8   Period Weeks   Status Achieved     PT LONG TERM GOAL #4   Title Pt will decrease DHI by at least 18 points in order to demonstrate clinically significant reduction in perception of handicap   Baseline 10/12/15: 52/100, 11/16/15: 48/100   Time 8   Period Weeks   Status On-going     PT LONG TERM GOAL #5   Title Pt will demonstrate increase in ABC by at least 13% in order to demonstrate significant improvement in balance confidence and decrease risk for falls   Baseline 10/12/15: 28.75%, 11/16/15: 30%   Time 8   Period Weeks   Status On-going               Plan - 11/25/15 1251    Clinical Impression Statement Continued to practice curb training with SPC, demonstrated technique of stepping up with R cane and LLE followed by RLE but pt demonstrated increase in posterior lean and LOB so worked towards stepping up with RLE first along with [place].  Pt required seated rest break after curb practice due to increased dizziness.  LE strength was  progressed with plantargrade hip extension and clamshells without any reported increaes in knee pain.  Pt continues to require 2 finger assist when performing bosu step taps but demonstrated better technqiue and decrease in posterior lean than previous attempts.  Pt would continue to benefit from further skilled physical therapy to address LE strength, dynamic balance and curb training to reduce patients falls risk and  increase functional mobility.     Rehab Potential Fair   Clinical Impairments Affecting Rehab Potential Positive: motivation, good safety awareness; Negative: chronicity   PT Frequency 2x / week   PT Duration 8 weeks   PT Treatment/Interventions ADLs/Self Care Home Management;Aquatic Therapy;Canalith Repostioning;Cryotherapy;Electrical Stimulation;Iontophoresis 4mg /ml Dexamethasone;Moist Heat;Traction;Ultrasound;DME Instruction;Gait training;Stair training;Functional mobility training;Therapeutic activities;Therapeutic exercise;Balance training;Neuromuscular re-education;Patient/family education;Manual techniques;Energy conservation;Vestibular   PT Next Visit Plan continue working on curb training, dynamic balance, progress hip strength and glut strength    PT Home Exercise Plan VOR x 1 horizontal in sitting 60 seconds x 2, vertrical x 2, 4 times/day (not currently progressed to standing due to poor technique and instability), sit to stand without UE support from elevated chair, feet apart standing with horizontal and vertical head turns   Consulted and Agree with Plan of Care Patient      Patient will benefit from skilled therapeutic intervention in order to improve the following deficits and impairments:  Decreased balance, Decreased mobility, Decreased strength, Difficulty walking, Dizziness  Visit Diagnosis: Difficulty in walking, not elsewhere classified  Muscle weakness (generalized)  Dizziness and giddiness     Problem List Patient Active Problem List   Diagnosis Date  Noted  . Dizziness 11/03/2015  . Hypertension 12/04/2011  . Hyperlipidemia 12/04/2011  . Syncope 12/04/2011  . Diabetes mellitus ([place]) 12/04/2011  . Smoking hx 12/04/2011   Stacy Gardner, SPT  This entire session was performed under direct supervision and direction of a licensed therapist/therapist assistant . I have personally read, edited and approve of the note as written.  Trotter,Margaret PT, DPT 11/25/2015, 5:22 PM  [place] MAIN [facility] SERVICES [address] [place], [place], [address] Phone: [phone]   Fax:  [phone]  Name: Amaria Zervas MRN: [id number] Date of Birth: June 19, 1924

## 2015-11-30 ENCOUNTER — Ambulatory Visit: Payer: Medicare Other | Admitting: Physical Therapy

## 2015-11-30 ENCOUNTER — Encounter: Payer: BLUE CROSS/BLUE SHIELD | Admitting: Physical Therapy

## 2015-11-30 ENCOUNTER — Encounter: Payer: Self-pay | Admitting: Physical Therapy

## 2015-11-30 VITALS — BP 137/50

## 2015-11-30 DIAGNOSIS — R42 Dizziness and giddiness: Secondary | ICD-10-CM

## 2015-11-30 DIAGNOSIS — M6281 Muscle weakness (generalized): Secondary | ICD-10-CM

## 2015-11-30 DIAGNOSIS — R262 Difficulty in walking, not elsewhere classified: Secondary | ICD-10-CM

## 2015-11-30 NOTE — Therapy (Signed)
[place] MAIN [facility] SERVICES [address] [place], [place], [address] Phone: [phone]   Fax:  [phone]  Physical Therapy Treatment  Patient Details  Name: Claire Washington MRN: [id number] Date of Birth: December 14, 1924 Referring Provider: Dr. Ginette Pitman  Encounter Date: 11/30/2015      PT End of Session - 11/30/15 1442    Visit Number 12   Number of Visits 17   Date for PT Re-Evaluation Dec 13, 2015   Authorization Type G codes every 10th visit; G Code visit #4/10   PT Start Time 1356   PT Stop Time 1443   PT Time Calculation (min) 47 min   Equipment Utilized During Treatment Gait belt   Activity Tolerance Patient tolerated treatment well   Behavior During Therapy [facility] for tasks assessed/performed      Past Medical History:  Diagnosis Date  . Cancer ([place])    basel cell  . Chronic cystitis   . Hyperlipidemia   . Hypertension   . Leg pain   . Osteoarthritis   . Small vessel disease ([place])   . Syncope and collapse 2013  . Thyroid nodule   . Thyroid nodule   . Vertigo     Past Surgical History:  Procedure Laterality Date  . BILATERAL SALPINGOOPHORECTOMY    . COLONOSCOPY    . CYSTOSCOPY    . TOTAL ABDOMINAL HYSTERECTOMY      Vitals:   11/30/15 1404  BP: (!) 137/50        Subjective Assessment - 11/30/15 1358    Subjective Patient states her blood pressure has been erratic lately and pt states she has noticed that she gets dizzy when her blood pressure has been high. Pt states her physician (cardiologist) has been adjusting her blood pressure medications. Encouraged patient to contact her cardiologist to report her most current blood pressures and patient's symptoms of dizziness when she has noted her blood pressure was higher. Pt in agreement and states she will contact her physician. Pt reports that she has been doing well with her home exercise program and has been doing her HEP regularly. Pt reports she has no questions at  this time.    Pertinent History Pt reports that she has been having problems with her balance for an extended period of time but progressively worsened recently. She also reports weakness with ascending stairs and finds herself needing to pull up on railings. She reports that she is very unsteady while descending stairs, ramps, and curbs. She uses a rollator for long distance community ambulation. For short distance community ambulation she uses a single point cane. At home she furniture walks but does not use an assistive device. She has a history of dizziness and has participated in vestibular rehab in the past which was helpful. Pt unable to recall her vestibular diagnosis. Over the last 1.5 weeks she has started performing VOR x 1 horizontal exercise at home. She has not noticed any improvement in her dizziness since starting the exercise. She has one episode of syncope in the setting of GI issues/constipation in 2013. Pt denies falls in the last 12 months however states, "I am very careful." She reports that she has limited her activities due her balance/dizziness complaints.    Patient Stated Goals Improve balance and decrease risk for falls   Currently in Pain? No/denies      Neuromuscular Re-education:  Performed length of // bars 4 reps, alternating mini-squats with sidesteps with few finger assist of one hand for  balance support as needed with vc for upright posture.    Ball circles: Performed standing on one leg, while lightly placing other foot on soccer ball while doing ball circles 5 reps and then switching legs. Performed multiple sets of this exercise. Pt required few finger assist on // bar for balance support during this exercise.    Ther-ex:   Quantum leg press machine 110# 3 sets of 15 reps  Curb training with 5" step using SPC per home environment. Pt ambulated up/ down 5" step with cane 2 sets of 10 reps. Pt performed first 8 reps with no significant LOB however when patient  fatigues patient demonstrated posterior lean and required min A to correct. Patient demonstrating good technique with cane placement and sequencing with curb however, pt did require cue to place entire foot onto step or curb. After rest break, attempted 6" step 2 reps however patient demonstrates increased instability with this height.  Performed sidelying clamshells 3 sets of 15 reps demonstrating good technique but required vc to hold 2-3 seconds between reps and increased muscle activation       PT Education - 11/30/15 1444    Education provided Yes   Education Details Reviewed curb training and clamshell exercise that was added to HEP last session.   Person(s) Educated Patient   Methods Explanation;Demonstration;Verbal cues   Comprehension Verbalized understanding;Returned demonstration;Verbal cues required             PT Long Term Goals - 11/16/15 1319      PT LONG TERM GOAL #1   Title Pt will be independent with HEP in order to reduce fall risk, improve strength, and improve function at home   Time 8   Period Weeks   Status On-going     PT LONG TERM GOAL #2   Title Pt will improve BERG by at least 5 points in order to demonstrate significant improvement in balance and decrease fall risk   Baseline 10/12/15: 21/56, 11/16/15: 36/56   Time 8   Period Weeks   Status Revised     PT LONG TERM GOAL #3   Title Pt will improve 5TSTS by at least 2.3 seconds in order to demonstrates clinically significant improvement in LE strength and balance   Baseline 10/12/15: 18.0 seconds, 11/16/15: 12.4 seconds   Time 8   Period Weeks   Status Achieved     PT LONG TERM GOAL #4   Title Pt will decrease DHI by at least 18 points in order to demonstrate clinically significant reduction in perception of handicap   Baseline 10/12/15: 52/100, 11/16/15: 48/100   Time 8   Period Weeks   Status On-going     PT LONG TERM GOAL #5   Title Pt will demonstrate increase in ABC by at least 13% in order to  demonstrate significant improvement in balance confidence and decrease risk for falls   Baseline 10/12/15: 28.75%, 11/16/15: 30%   Time 8   Period Weeks   Status On-going               Plan - 11/30/15 1542    Clinical Impression Statement Patient denies knee pain during session this date. Pt reports that she feels she is making progress and states she has been compliant with HEP. Pt did better this date with curb training with fewer LOB and demonstrated improved sequencing with decreased vc. Patient does continue to require 2 finger assist with majority of balance exercises and would benefit from continued PT  services to further address strengthening, balance and to try to reduce falls risk. Encouraged patient to continue to follow-up as indicated.    Rehab Potential Fair   Clinical Impairments Affecting Rehab Potential Positive: motivation, good safety awareness; Negative: chronicity   PT Frequency 2x / week   PT Duration 8 weeks   PT Treatment/Interventions ADLs/Self Care Home Management;Aquatic Therapy;Canalith Repostioning;Cryotherapy;Electrical Stimulation;Iontophoresis 4mg /ml Dexamethasone;Moist Heat;Traction;Ultrasound;DME Instruction;Gait training;Stair training;Functional mobility training;Therapeutic activities;Therapeutic exercise;Balance training;Neuromuscular re-education;Patient/family education;Manual techniques;Energy conservation;Vestibular   PT Next Visit Plan continue working on curb training, dynamic balance, progress hip strength and glut strength    PT Home Exercise Plan VOR x 1 horizontal in sitting 60 seconds x 2, vertrical x 2, 4 times/day (not currently progressed to standing due to poor technique and instability), sit to stand without UE support from elevated chair, feet apart standing with horizontal and vertical head turns   Consulted and Agree with Plan of Care Patient      Patient will benefit from skilled therapeutic intervention in order to improve the  following deficits and impairments:  Decreased balance, Decreased mobility, Decreased strength, Difficulty walking, Dizziness  Visit Diagnosis: Difficulty in walking, not elsewhere classified  Muscle weakness (generalized)  Dizziness and giddiness     Problem List Patient Active Problem List   Diagnosis Date Noted  . Dizziness 11/03/2015  . Hypertension 12/04/2011  . Hyperlipidemia 12/04/2011  . Syncope 12/04/2011  . Diabetes mellitus ([place]) 12/04/2011  . Smoking hx 12/04/2011   Lady Deutscher PT, DPT Lady Deutscher 11/30/2015, 4:05 PM  [place] MAIN [facility] SERVICES [address] [place], [place], [address] Phone: [phone]   Fax:  [phone]  Name: Tymesha Huhn MRN: [id number] Date of Birth: 1924/08/04

## 2015-12-03 ENCOUNTER — Encounter: Payer: Self-pay | Admitting: Physical Therapy

## 2015-12-03 ENCOUNTER — Ambulatory Visit: Payer: Medicare Other

## 2015-12-03 VITALS — BP 143/47 | HR 71

## 2015-12-03 DIAGNOSIS — M6281 Muscle weakness (generalized): Secondary | ICD-10-CM

## 2015-12-03 DIAGNOSIS — R262 Difficulty in walking, not elsewhere classified: Secondary | ICD-10-CM | POA: Diagnosis not present

## 2015-12-03 DIAGNOSIS — R42 Dizziness and giddiness: Secondary | ICD-10-CM

## 2015-12-03 NOTE — Therapy (Signed)
[place] MAIN [facility] SERVICES [address] [place], [place], [address] Phone: [phone]   Fax:  [phone]  Physical Therapy Treatment  Patient Details  Name: Claire Washington MRN: [id number] Date of Birth: 08/19/1924 Referring Provider: Dr. Ginette Pitman  Encounter Date: 12/03/2015      PT End of Session - 12/03/15 1130    Visit Number 13   Number of Visits 17   Date for PT Re-Evaluation 01-05-2016   Authorization Type G codes every 10th visit; G Code visit #5/10   PT Start Time 1125   PT Stop Time 1205   PT Time Calculation (min) 40 min   Equipment Utilized During Treatment Gait belt   Activity Tolerance Patient tolerated treatment well   Behavior During Therapy [facility] for tasks assessed/performed      Past Medical History:  Diagnosis Date  . Cancer ([place])    basel cell  . Chronic cystitis   . Hyperlipidemia   . Hypertension   . Leg pain   . Osteoarthritis   . Small vessel disease ([place])   . Syncope and collapse 2013  . Thyroid nodule   . Thyroid nodule   . Vertigo     Past Surgical History:  Procedure Laterality Date  . BILATERAL SALPINGOOPHORECTOMY    . COLONOSCOPY    . CYSTOSCOPY    . TOTAL ABDOMINAL HYSTERECTOMY      Vitals:   12/03/15 1129  BP: (!) 143/47  Pulse: 71  SpO2: 95%        Subjective Assessment - 12/03/15 1127    Subjective Pt reports she continues to do well. She denies dizziness currently and states that her BP has been better recently. She reports some increase in L knee pain over the last couple days but not currently. No specific questions or concerns currently. Reports she has not been very regular regarding her HEP.    Pertinent History Pt reports that she has been having problems with her balance for an extended period of time but progressively worsened recently. She also reports weakness with ascending stairs and finds herself needing to pull up on railings. She reports that she is very unsteady  while descending stairs, ramps, and curbs. She uses a rollator for long distance community ambulation. For short distance community ambulation she uses a single point cane. At home she furniture walks but does not use an assistive device. She has a history of dizziness and has participated in vestibular rehab in the past which was helpful. Pt unable to recall her vestibular diagnosis. Over the last 1.5 weeks she has started performing VOR x 1 horizontal exercise at home. She has not noticed any improvement in her dizziness since starting the exercise. She has one episode of syncope in the setting of GI issues/constipation in 2013. Pt denies falls in the last 12 months however states, "I am very careful." She reports that she has limited her activities due her balance/dizziness complaints.    Patient Stated Goals Improve balance and decrease risk for falls   Currently in Pain? No/denies      TREATMENT   Neuromuscular Re-education: Curb training with 5" step using a variety of devices including spc, quad cane, and hemiwalker. Pt ambulated up/ down 5" step a variety of times with considerable cues for technique. Attempted descent both forward and backwards. Pt demonstrates considerable instability. She is most stable with hemiwalker however does not like to use the device. Educated regarding ambulation with hemiwalker in rehab gym  but pt has difficulty lifting and progressing due to weakness and instability.     Ther-ex: Sit to stand x 5, with 2kg ball performing overhead press 2 x 5; Red tband resisted sidestepping 8' x 4 lengths, twice; Side steps up 4" step with unilateral and 2 finger assistance; Seated clams with red tband 2 x 15; Seated marches with red tband 2 x 15;                        PT Education - 12/03/15 1130    Education provided Yes   Education Details Reinforced HEP   Person(s) Educated Patient   Methods Explanation   Comprehension Verbalized understanding              PT Long Term Goals - 11/16/15 1319      PT LONG TERM GOAL #1   Title Pt will be independent with HEP in order to reduce fall risk, improve strength, and improve function at home   Time 8   Period Weeks   Status On-going     PT LONG TERM GOAL #2   Title Pt will improve BERG by at least 5 points in order to demonstrate significant improvement in balance and decrease fall risk   Baseline 10/12/15: 21/56, 11/16/15: 36/56   Time 8   Period Weeks   Status Revised     PT LONG TERM GOAL #3   Title Pt will improve 5TSTS by at least 2.3 seconds in order to demonstrates clinically significant improvement in LE strength and balance   Baseline 10/12/15: 18.0 seconds, 11/16/15: 12.4 seconds   Time 8   Period Weeks   Status Achieved     PT LONG TERM GOAL #4   Title Pt will decrease DHI by at least 18 points in order to demonstrate clinically significant reduction in perception of handicap   Baseline 10/12/15: 52/100, 11/16/15: 48/100   Time 8   Period Weeks   Status On-going     PT LONG TERM GOAL #5   Title Pt will demonstrate increase in ABC by at least 13% in order to demonstrate significant improvement in balance confidence and decrease risk for falls   Baseline 10/12/15: 28.75%, 11/16/15: 30%   Time 8   Period Weeks   Status On-going               Plan - 12/03/15 1134    Clinical Impression Statement Pt demonstrates improvement with sit to stand training on this date. She is able to perform with 2kg ball performing overhead press once standing. She performs curb training and is most stable with hemiwalker however does not ambulate well with this device on firm ground and she does not like the idea of using this compared to her rollator. Will continue to work on balance and strength in order to improve her ability to perform curbs with a single point cane. Will need to repeat outcome measures at next visit and recertify her for more therapy.    Rehab Potential Fair    Clinical Impairments Affecting Rehab Potential Positive: motivation, good safety awareness; Negative: chronicity   PT Frequency 2x / week   PT Duration 8 weeks   PT Treatment/Interventions ADLs/Self Care Home Management;Aquatic Therapy;Canalith Repostioning;Cryotherapy;Electrical Stimulation;Iontophoresis 4mg /ml Dexamethasone;Moist Heat;Traction;Ultrasound;DME Instruction;Gait training;Stair training;Functional mobility training;Therapeutic activities;Therapeutic exercise;Balance training;Neuromuscular re-education;Patient/family education;Manual techniques;Energy conservation;Vestibular   PT Next Visit Plan Repeat outcome measures and recert. Continue working on Water engineer, dynamic balance, progress hip strength and glut  strength    PT Home Exercise Plan VOR x 1 horizontal in sitting 60 seconds x 2, vertrical x 2, 4 times/day (not currently progressed to standing due to poor technique and instability), sit to stand without UE support from elevated chair, feet apart standing with horizontal and vertical head turns   Consulted and Agree with Plan of Care Patient      Patient will benefit from skilled therapeutic intervention in order to improve the following deficits and impairments:  Decreased balance, Decreased mobility, Decreased strength, Difficulty walking, Dizziness  Visit Diagnosis: Difficulty in walking, not elsewhere classified  Muscle weakness (generalized)  Dizziness and giddiness     Problem List Patient Active Problem List   Diagnosis Date Noted  . Dizziness 11/03/2015  . Hypertension 12/04/2011  . Hyperlipidemia 12/04/2011  . Syncope 12/04/2011  . Diabetes mellitus ([place]) 12/04/2011  . Smoking hx 12/04/2011   Phillips Grout PT, DPT   Bekka Qian 12/03/2015, 12:56 PM  [place] MAIN [facility] SERVICES [address] [place], [place], [address] Phone: [phone]   Fax:  [phone]  Name: Kamarri Asai MRN:  [id number] Date of Birth: 1925-01-12

## 2015-12-07 ENCOUNTER — Encounter: Payer: Self-pay | Admitting: Physical Therapy

## 2015-12-07 ENCOUNTER — Encounter: Payer: BLUE CROSS/BLUE SHIELD | Admitting: Physical Therapy

## 2015-12-07 ENCOUNTER — Ambulatory Visit: Payer: Medicare Other | Admitting: Physical Therapy

## 2015-12-07 DIAGNOSIS — R262 Difficulty in walking, not elsewhere classified: Secondary | ICD-10-CM | POA: Diagnosis not present

## 2015-12-07 DIAGNOSIS — M6281 Muscle weakness (generalized): Secondary | ICD-10-CM

## 2015-12-07 DIAGNOSIS — R42 Dizziness and giddiness: Secondary | ICD-10-CM

## 2015-12-07 NOTE — Therapy (Signed)
[place] MAIN [facility] SERVICES [address] [place], [place], [address] Phone: [phone]   Fax:  [phone]  Physical Therapy Treatment/Progress Note    Patient Details  Name: Claire Washington MRN: [id number] Date of Birth: 23-Sep-1924 Referring Provider: Dr. Ginette Pitman  Encounter Date: 12/07/2015      PT End of Session - 12/07/15 1136    Visit Number 14   Number of Visits 29   Date for PT Re-Evaluation 01-24-16   Authorization Type G codes every 10th visit; G Code visit #6/10   PT Start Time 1045   PT Stop Time 1130   PT Time Calculation (min) 45 min   Equipment Utilized During Treatment Gait belt   Activity Tolerance Patient tolerated treatment well   Behavior During Therapy [facility] for tasks assessed/performed      Past Medical History:  Diagnosis Date  . Cancer ([place])    basel cell  . Chronic cystitis   . Hyperlipidemia   . Hypertension   . Leg pain   . Osteoarthritis   . Small vessel disease ([place])   . Syncope and collapse 2013  . Thyroid nodule   . Thyroid nodule   . Vertigo     Past Surgical History:  Procedure Laterality Date  . BILATERAL SALPINGOOPHORECTOMY    . COLONOSCOPY    . CYSTOSCOPY    . TOTAL ABDOMINAL HYSTERECTOMY      There were no vitals filed for this visit.      Subjective Assessment - 12/07/15 1051    Subjective Pt reports that she is doing well today and has no dizziness at the moment.  She did report a large increase in dizziness yesterday after trying to look up at eclipse but has since resolved.  She also reports she may have slightly twisted her knee trying to get into and out of the car.     Pertinent History Pt reports that she has been having problems with her balance for an extended period of time but progressively worsened recently. She also reports weakness with ascending stairs and finds herself needing to pull up on railings. She reports that she is very unsteady while descending stairs,  ramps, and curbs. She uses a rollator for long distance community ambulation. For short distance community ambulation she uses a single point cane. At home she furniture walks but does not use an assistive device. She has a history of dizziness and has participated in vestibular rehab in the past which was helpful. Pt unable to recall her vestibular diagnosis. Over the last 1.5 weeks she has started performing VOR x 1 horizontal exercise at home. She has not noticed any improvement in her dizziness since starting the exercise. She has one episode of syncope in the setting of GI issues/constipation in 2013. Pt denies falls in the last 12 months however states, "I am very careful." She reports that she has limited her activities due her balance/dizziness complaints.    Patient Stated Goals Improve balance and decrease risk for falls   Currently in Pain? No/denies            [facility] PT Assessment - 12/07/15 0001      Observation/Other Assessments   Activities of Balance Confidence Scale (ABC Scale)  30%   Dizziness Handicap Inventory (DHI)  52/100  moderate perception of dizziness      Standardized Balance Assessment   Standardized Balance Assessment Berg Balance Test   Five times sit to stand comments  13.54  seconds   without use of UEs    10 Meter Walk 10.2 seconds with rollator, .56m/s   indicating full community ambulator      Hospital doctor   Sit to Stand Able to stand without using hands and stabilize independently   Standing Unsupported Able to stand safely 2 minutes   Sitting with Back Unsupported but Feet Supported on Floor or Stool Able to sit safely and securely 2 minutes   Stand to Sit Sits safely with minimal use of hands   Transfers Able to transfer safely, minor use of hands   Standing Unsupported with Eyes Closed Able to stand 3 seconds   Standing Ubsupported with Feet Together Needs help to attain position and unable to hold for 15 seconds   From Standing, Reach Forward  with Outstretched Arm Can reach forward >5 cm safely (2")   From Standing Position, Pick up Object from Floor Unable to pick up and needs supervision   From Standing Position, Turn to Look Behind Over each Shoulder Looks behind one side only/other side shows less weight shift   Turn 360 Degrees Needs close supervision or verbal cueing   Standing Unsupported, Alternately Place Feet on Step/Stool Able to stand independently and complete 8 steps >20 seconds   Standing Unsupported, One Foot in Front Needs help to step but can hold 15 seconds   Standing on One Leg Tries to lift leg/unable to hold 3 seconds but remains standing independently   Total Score 34   Berg comment: 34/56  high falls risk      Timed Up and Go Test   TUG Normal TUG   Normal TUG (seconds) 14.93       Treatment 5 Time sit to stand without use of UEs-13.54 seconds indicating decreased falls risk compared to previous progress check of 12.4 seconds  10 m walk test with rollator, no assistance-0.47m/s indicating full community ambulator compared to previous progress check of 0.87m/s TUG test with rollator, no assistance-14.93 seconds indicating decreased falls risk compared to previous progress check of 15.1 seconds  BERG Balance test-34/56 indicating increased falls risk compared to previous progress check of 36/56  Curb training 4 inch step  -10 reps ascending curb with R SPC leading with RLE, CGA for security, min VCs given to increase core and glut activation, pt progressed to no posterior LOB and CGA for safety    -5 steps ascending/descending with R SPC, pt ascends/descends leading with RLE and R SPC, min VCs to increased R stance time descending step to increase stability before stepping down with LLE,  pt demonstrated better control and decrease in posterior lean                     PT Education - 12/07/15 1135    Education provided Yes   Education Details reinforced importance of HEP, plan of care,  curb training with [facility]    Person(s) Educated Patient   Methods Explanation;Demonstration;Verbal cues   Comprehension Verbalized understanding;Returned demonstration;Verbal cues required             PT Long Term Goals - 12/07/15 1147      PT LONG TERM GOAL #1   Title Pt will be independent with HEP in order to reduce fall risk, improve strength, and improve function at home   Time 6   Period Weeks   Status On-going     PT LONG TERM GOAL #2   Title Pt will improve BERG by at  least 5 points in order to demonstrate significant improvement in balance and decrease fall risk   Baseline 10/12/15: 21/56, 11/16/15: 36/56, 12/07/15:34/56   Time 6   Period Weeks   Status Partially Met     PT LONG TERM GOAL #3   Title Pt will improve 5TSTS by at least 2.3 seconds in order to demonstrates clinically significant improvement in LE strength and balance   Baseline 10/12/15: 18.0 seconds, 11/16/15: 12.4 seconds   Time 6   Period Weeks   Status Achieved     PT LONG TERM GOAL #4   Title Pt will decrease DHI by at least 18 points in order to demonstrate clinically significant reduction in perception of handicap   Baseline 10/12/15: 52/100, 11/16/15: 48/100, 12/07/15:52/100   Time 6   Period Weeks   Status On-going     PT LONG TERM GOAL #5   Title Pt will demonstrate increase in ABC by at least 13% in order to demonstrate significant improvement in balance confidence and decrease risk for falls   Baseline 10/12/15: 28.75%, 11/16/15: 30%, 12/07/15:30%   Time 6   Period Weeks   Status On-going               Plan - 12/07/15 1144    Clinical Impression Statement Pt reports that she believes she is making progress with physical therapy and seems to be fairly diligent with her HEP.  She continues to report similar perceived dizziness according to her [place] since previous progress check and similar perceived balance confidence since previous progress check.  Pt continues to be at increased falls risk  according to her TUG and BERG balance scores.  Pt demonstrated increased difficulty bending to pick objects off the floor, tandem stance, feet together, and eyes closed.  Pt reports at this time she is unable to get shoes from an elevated shoe rack on the floor due to increase in dizziness.  Pt demonstrated slight improvement with TUG score and 5TSTS in under 15 seconds.  Continued to work on Water engineer with [place].  Pt progressed to CGA ascending curb step  with no posterior LOB and min A for stability descending curb step with increased time to steady RLE before completeing step with LLE.  She would continue to benefit from further skilled physical therapy to address her balance, LE strength, and curb training for greater functional mobility.     Rehab Potential Fair   Clinical Impairments Affecting Rehab Potential Positive: motivation, good safety awareness; Negative: chronicity   PT Frequency 2x / week   PT Duration 6 weeks   PT Treatment/Interventions ADLs/Self Care Home Management;Aquatic Therapy;Canalith Repostioning;Cryotherapy;Electrical Stimulation;Iontophoresis '4mg'$ /ml Dexamethasone;Moist Heat;Traction;Ultrasound;DME Instruction;Gait training;Stair training;Functional mobility training;Therapeutic activities;Therapeutic exercise;Balance training;Neuromuscular re-education;Patient/family education;Manual techniques;Energy conservation;Vestibular   PT Next Visit Plan continue curb training, dynamic balance, progress hip and glut strength    PT Home Exercise Plan VOR x 1 horizontal in sitting 60 seconds x 2, vertrical x 2, 4 times/day (not currently progressed to standing due to poor technique and instability), sit to stand without UE support from elevated chair, feet apart standing with horizontal and vertical head turns   Consulted and Agree with Plan of Care Patient      Patient will benefit from skilled therapeutic intervention in order to improve the following deficits and impairments:   Decreased balance, Decreased mobility, Decreased strength, Difficulty walking, Dizziness  Visit Diagnosis: Difficulty in walking, not elsewhere classified - Plan: PT plan of care cert/re-cert  Muscle weakness (generalized) - Plan:  PT plan of care cert/re-cert  Dizziness and giddiness - Plan: PT plan of care cert/re-cert     Problem List Patient Active Problem List   Diagnosis Date Noted  . Dizziness 11/03/2015  . Hypertension 12/04/2011  . Hyperlipidemia 12/04/2011  . Syncope 12/04/2011  . Diabetes mellitus ([place]) 12/04/2011  . Smoking hx 12/04/2011   Stacy Gardner, SPT  This entire session was performed under direct supervision and direction of a licensed therapist/therapist assistant . I have personally read, edited and approve of the note as written.  Trotter,Margaret PT, DPT 12/07/2015, 2:46 PM  [place] MAIN [facility] SERVICES [address] [place], [place], [address] Phone: [phone]   Fax:  [phone]  Name: Claire Washington MRN: [id number] Date of Birth: [birth year]

## 2015-12-09 ENCOUNTER — Other Ambulatory Visit: Payer: Self-pay | Admitting: Cardiovascular Disease

## 2015-12-10 ENCOUNTER — Ambulatory Visit: Payer: Medicare Other

## 2015-12-10 ENCOUNTER — Encounter: Payer: Self-pay | Admitting: Physical Therapy

## 2015-12-10 VITALS — BP 139/39 | HR 62

## 2015-12-10 DIAGNOSIS — R262 Difficulty in walking, not elsewhere classified: Secondary | ICD-10-CM

## 2015-12-10 DIAGNOSIS — M6281 Muscle weakness (generalized): Secondary | ICD-10-CM

## 2015-12-10 NOTE — Therapy (Signed)
[place] MAIN [facility] SERVICES [address] [place], [place], [address] Phone: [phone]   Fax:  [phone]  Physical Therapy Treatment  Patient Details  Name: Claire Washington MRN: [id number] Date of Birth: [birth year] Referring Provider: Dr. Ginette Pitman  Encounter Date: 12/10/2015      PT End of Session - 12/10/15 1028    Visit Number 15   Number of Visits 29   Date for PT Re-Evaluation 02/10/16   Authorization Type G codes every 10th visit; G Code visit #7/10   PT Start Time 1030   PT Stop Time 1115   PT Time Calculation (min) 45 min   Equipment Utilized During Treatment Gait belt   Activity Tolerance Patient tolerated treatment well   Behavior During Therapy [facility] for tasks assessed/performed      Past Medical History:  Diagnosis Date  . Cancer ([place])    basel cell  . Chronic cystitis   . Hyperlipidemia   . Hypertension   . Leg pain   . Osteoarthritis   . Small vessel disease ([place])   . Syncope and collapse 2013  . Thyroid nodule   . Thyroid nodule   . Vertigo     Past Surgical History:  Procedure Laterality Date  . BILATERAL SALPINGOOPHORECTOMY    . COLONOSCOPY    . CYSTOSCOPY    . TOTAL ABDOMINAL HYSTERECTOMY      Vitals:   12/10/15 1034  BP: (!) 139/39  Pulse: 62  SpO2: 99%        Subjective Assessment - 12/10/15 1027    Subjective Pt states she is doing well on this date. She states that she had a little dizziness yesterday but believes it was due to her BP being low. She also had some brief dizziness this morning when she was picking something off the floor but has since resolved. HEP is going well at home and pt has no questions or concerns currently.    Pertinent History Pt reports that she has been having problems with her balance for an extended period of time but progressively worsened recently. She also reports weakness with ascending stairs and finds herself needing to pull up on railings. She reports  that she is very unsteady while descending stairs, ramps, and curbs. She uses a rollator for long distance community ambulation. For short distance community ambulation she uses a single point cane. At home she furniture walks but does not use an assistive device. She has a history of dizziness and has participated in vestibular rehab in the past which was helpful. Pt unable to recall her vestibular diagnosis. Over the last 1.5 weeks she has started performing VOR x 1 horizontal exercise at home. She has not noticed any improvement in her dizziness since starting the exercise. She has one episode of syncope in the setting of GI issues/constipation in 2013. Pt denies falls in the last 12 months however states, "I am very careful." She reports that she has limited her activities due her balance/dizziness complaints.    Patient Stated Goals Improve balance and decrease risk for falls   Currently in Pain? No/denies            TREATMENT  Neuromuscular Re-education: Curb training 5 inch step, 5 steps ascending/descending with R SPC, pt ascends/descends leading with RLE and R SPC, pt demonstrated considerably better control and decrease in posterior lean on this date.   Braiding in // bars x 2 lengths; Airex balance with toe taps to stepping  stones alternating LE and intermittently crossing midline; Toe taps to 5" step alternating LE x 10 each; Worked on stepping strategy during posterior LOB with therapist providing assist and then removing support, x 5 with RLE and x 5 with LLE, Pt has very poor stepping strategy primarily reaching with hands for // bars;  Ther-ex: NuStep L3 x 4 minutes during history (3 minutes unbilled); Sit to stand 1x10, 1x5 with 2kg ball performing overhead press; Forward and lateral lunges onto Airex pad alternating LE x 10 each; Side steps up 4" step without UE support and minA+1 from therapist, intermittently reaching for // bars, x 5 to the L and x 3 to the right  secondary to fatigue and worsening dizziness with straining;                         PT Education - 12/10/15 1027    Education provided Yes   Education Details HEP reinforced   Person(s) Educated Patient   Methods Explanation   Comprehension Verbalized understanding             PT Long Term Goals - 12/07/15 1147      PT LONG TERM GOAL #1   Title Pt will be independent with HEP in order to reduce fall risk, improve strength, and improve function at home   Time 6   Period Weeks   Status On-going     PT LONG TERM GOAL #2   Title Pt will improve BERG by at least 5 points in order to demonstrate significant improvement in balance and decrease fall risk   Baseline 10/12/15: 21/56, 11/16/15: 36/56, 12/07/15:34/56   Time 6   Period Weeks   Status Partially Met     PT LONG TERM GOAL #3   Title Pt will improve 5TSTS by at least 2.3 seconds in order to demonstrates clinically significant improvement in LE strength and balance   Baseline 10/12/15: 18.0 seconds, 11/16/15: 12.4 seconds   Time 6   Period Weeks   Status Achieved     PT LONG TERM GOAL #4   Title Pt will decrease DHI by at least 18 points in order to demonstrate clinically significant reduction in perception of handicap   Baseline 10/12/15: 52/100, 11/16/15: 48/100, 12/07/15:52/100   Time 6   Period Weeks   Status On-going     PT LONG TERM GOAL #5   Title Pt will demonstrate increase in ABC by at least 13% in order to demonstrate significant improvement in balance confidence and decrease risk for falls   Baseline 10/12/15: 28.75%, 11/16/15: 30%, 12/07/15:30%   Time 6   Period Weeks   Status On-going               Plan - 12/10/15 1029    Clinical Impression Statement Pt continues to make progress with balance and strengthening. She demonstrates improved safety with simulated curb training during PT session. Pt with very poor stepping strategies when practiced during session. She fatigues quickly with  lateral step-ups as well. Pt encouraged to continue HEP and follow-up as scheduled.    Rehab Potential Fair   Clinical Impairments Affecting Rehab Potential Positive: motivation, good safety awareness; Negative: chronicity   PT Frequency 2x / week   PT Duration 6 weeks   PT Treatment/Interventions ADLs/Self Care Home Management;Aquatic Therapy;Canalith Repostioning;Cryotherapy;Electrical Stimulation;Iontophoresis '4mg'$ /ml Dexamethasone;Moist Heat;Traction;Ultrasound;DME Instruction;Gait training;Stair training;Functional mobility training;Therapeutic activities;Therapeutic exercise;Balance training;Neuromuscular re-education;Patient/family education;Manual techniques;Energy conservation;Vestibular   PT Next Visit Plan continue curb training,  dynamic balance, ankle strategies on rocker board, stepping strategies, progress hip and glut strength    PT Home Exercise Plan VOR x 1 horizontal in sitting 60 seconds x 2, vertrical x 2, 4 times/day (not currently progressed to standing due to poor technique and instability), sit to stand without UE support from elevated chair, feet apart standing with horizontal and vertical head turns   Consulted and Agree with Plan of Care Patient      Patient will benefit from skilled therapeutic intervention in order to improve the following deficits and impairments:  Decreased balance, Decreased mobility, Decreased strength, Difficulty walking, Dizziness  Visit Diagnosis: Muscle weakness (generalized)  Difficulty in walking, not elsewhere classified     Problem List Patient Active Problem List   Diagnosis Date Noted  . Dizziness 11/03/2015  . Hypertension 12/04/2011  . Hyperlipidemia 12/04/2011  . Syncope 12/04/2011  . Diabetes mellitus ([place]) 12/04/2011  . Smoking hx 12/04/2011   Phillips Grout PT, DPT   Emonee Winkowski 12/10/2015, 11:33 AM  [place] MAIN [facility] SERVICES [address] [place], [place],  [address] Phone: [phone]   Fax:  [phone]  Name: Claire Washington MRN: [id number] Date of Birth: 1924/04/19

## 2015-12-14 ENCOUNTER — Ambulatory Visit: Payer: Medicare Other | Admitting: Physical Therapy

## 2015-12-14 ENCOUNTER — Encounter: Payer: Self-pay | Admitting: Physical Therapy

## 2015-12-14 VITALS — BP 152/40

## 2015-12-14 DIAGNOSIS — R262 Difficulty in walking, not elsewhere classified: Secondary | ICD-10-CM

## 2015-12-14 DIAGNOSIS — M6281 Muscle weakness (generalized): Secondary | ICD-10-CM

## 2015-12-14 DIAGNOSIS — R42 Dizziness and giddiness: Secondary | ICD-10-CM

## 2015-12-14 NOTE — Therapy (Signed)
[place] MAIN [facility] SERVICES [address] [place], [place], [address] Phone: [phone]   Fax:  [phone]  Physical Therapy Treatment  Patient Details  Name: Claire Washington MRN: [id number] Date of Birth: 03-04-1925 Referring Provider: Dr. Ginette Pitman  Encounter Date: 12/14/2015      PT End of Session - 12/14/15 1531    Visit Number 16   Number of Visits 29   Date for PT Re-Evaluation 2016-01-19   Authorization Type G codes every 10th visit; G Code visit #8/10   PT Start Time 1034   PT Stop Time 1114   PT Time Calculation (min) 40 min   Equipment Utilized During Treatment Gait belt   Activity Tolerance Patient tolerated treatment well   Behavior During Therapy [facility] for tasks assessed/performed      Past Medical History:  Diagnosis Date  . Cancer ([place])    basel cell  . Chronic cystitis   . Hyperlipidemia   . Hypertension   . Leg pain   . Osteoarthritis   . Small vessel disease ([place])   . Syncope and collapse 2013  . Thyroid nodule   . Thyroid nodule   . Vertigo     Past Surgical History:  Procedure Laterality Date  . BILATERAL SALPINGOOPHORECTOMY    . COLONOSCOPY    . CYSTOSCOPY    . TOTAL ABDOMINAL HYSTERECTOMY      Vitals:   12/14/15 1040  BP: (!) 152/40        Subjective Assessment - 12/14/15 1035    Subjective Pt states she has dizziness when she bends down to get something low and states that is fairly new. Pt states she tries to do that slowly. Pt states that she does not get dizzy when doing the VOR exercise.    Pertinent History Pt reports that she has been having problems with her balance for an extended period of time but progressively worsened recently. She also reports weakness with ascending stairs and finds herself needing to pull up on railings. She reports that she is very unsteady while descending stairs, ramps, and curbs. She uses a rollator for long distance community ambulation. For short distance  community ambulation she uses a single point cane. At home she furniture walks but does not use an assistive device. She has a history of dizziness and has participated in vestibular rehab in the past which was helpful. Pt unable to recall her vestibular diagnosis. Over the last 1.5 weeks she has started performing VOR x 1 horizontal exercise at home. She has not noticed any improvement in her dizziness since starting the exercise. She has one episode of syncope in the setting of GI issues/constipation in 2013. Pt denies falls in the last 12 months however states, "I am very careful." She reports that she has limited her activities due her balance/dizziness complaints.    Patient Stated Goals Improve balance and decrease risk for falls   Currently in Pain? No/denies      Neuromuscular Re-education: Patient's blood pressure taken on arrival and it was 152/40 mmHg. Patient reports that it has been running a little lower recently. Discussed with patient that low blood pressure could potentially be a contributing factor to dizziness and patient's complaints of dizziness with bending over recently. Discussed strategies to minimize dizziness and to increase safety with bending over. Recommended that patient continue to monitor her blood pressure at home and that if it is consistently staying low to contact her physician. Pt reports that  several months ago her BP had been low and that her physician "cut her medicine in 1/2" at that time which did improve her symptoms per pt report.    Curb training 5" step, 5 steps ascending/descending with R SPC, pt ascends/descends leading with RLE and R SPC,pt demonstrated improved better control and decrease in posterior lean but did have one posterior LOB that she was able to self-correct. Attempted curb training on 6" step, 3 attempts ascending/descencing step with R SPC with CGA to Min A. Pt had increased LOB and had difficulty controlling eccentric movement to descend the  step. Pt reaching // bar for support at times due to LOB.      foam roll: On 1/2 foam roll with flat side down and then round side down worked on static holds for 3-5 minutes each. Patient had increased difficulty with round side down and was able to maintain balance for less than 8 seconds before reaching for support.  Worked on 1/2 foam roll flat side down static holds and then added clock arm reaching without and them with head and eyes following target.  Worked on 1/2 foam roll with flat side down side stepping L/R with faded UEs support.    Rockerboard: On small wooden rocker board, worked on [employer] and A/P sways with and without clock arm reaching. Performed SS sways while performing clock arm reaching and added head following target.         PT Education - 12/14/15 1529    Education provided Yes   Education Details discussed strategies for bending over safely including squatting instead of bending forward at the waist, holding support object while she squats and performing motion slowly.    Person(s) Educated Patient   Methods Explanation   Comprehension Verbalized understanding             PT Long Term Goals - 12/07/15 1147      PT LONG TERM GOAL #1   Title Pt will be independent with HEP in order to reduce fall risk, improve strength, and improve function at home   Time 6   Period Weeks   Status On-going     PT LONG TERM GOAL #2   Title Pt will improve BERG by at least 5 points in order to demonstrate significant improvement in balance and decrease fall risk   Baseline 10/12/15: 21/56, 11/16/15: 36/56, 12/07/15:34/56   Time 6   Period Weeks   Status Partially Met     PT LONG TERM GOAL #3   Title Pt will improve 5TSTS by at least 2.3 seconds in order to demonstrates clinically significant improvement in LE strength and balance   Baseline 10/12/15: 18.0 seconds, 11/16/15: 12.4 seconds   Time 6   Period Weeks   Status Achieved     PT LONG TERM GOAL #4   Title Pt will  decrease DHI by at least 18 points in order to demonstrate clinically significant reduction in perception of handicap   Baseline 10/12/15: 52/100, 11/16/15: 48/100, 12/07/15:52/100   Time 6   Period Weeks   Status On-going     PT LONG TERM GOAL #5   Title Pt will demonstrate increase in ABC by at least 13% in order to demonstrate significant improvement in balance confidence and decrease risk for falls   Baseline 10/12/15: 28.75%, 11/16/15: 30%, 12/07/15:30%   Time 6   Period Weeks   Status On-going               Plan -  12/14/15 1531    Clinical Impression Statement Patient motivated to session and reports compliance with HEP. Pt with low diastolic blood pressure this date with complaints of dizziness with bending forward. Encouraged patient to continue to monitor blood pressure and to contact her primary care physician if blood pressure continues to be low. Discussed strategies for safety with bending over to retrieve objects. Patient did well wtih curb training on 5" step but continues to struggle with curb training on 6' step at this time. Pt would benefit from continued PT services to try to decrease falls risk and to address functional deficits.    Rehab Potential Fair   Clinical Impairments Affecting Rehab Potential Positive: motivation, good safety awareness; Negative: chronicity   PT Frequency 2x / week   PT Duration 6 weeks   PT Treatment/Interventions ADLs/Self Care Home Management;Aquatic Therapy;Canalith Repostioning;Cryotherapy;Electrical Stimulation;Iontophoresis '4mg'$ /ml Dexamethasone;Moist Heat;Traction;Ultrasound;DME Instruction;Gait training;Stair training;Functional mobility training;Therapeutic activities;Therapeutic exercise;Balance training;Neuromuscular re-education;Patient/family education;Manual techniques;Energy conservation;Vestibular   PT Next Visit Plan continue curb training, dynamic balance, ankle strategies on rocker board, stepping strategies, progress hip and glut  strength; (pt reports she has been doing T-band exercises at home-consider reviewing with patient and adding additional exercises or progressions as inidcated to help further address LE strength deficits)   PT Home Exercise Plan VOR x 1 horizontal in sitting 60 seconds x 2, vertrical x 2, 4 times/day (not currently progressed to standing due to poor technique and instability), sit to stand without UE support from elevated chair, feet apart standing with horizontal and vertical head turns   Consulted and Agree with Plan of Care Patient      Patient will benefit from skilled therapeutic intervention in order to improve the following deficits and impairments:  Decreased balance, Decreased mobility, Decreased strength, Difficulty walking, Dizziness  Visit Diagnosis: Muscle weakness (generalized)  Difficulty in walking, not elsewhere classified  Dizziness and giddiness     Problem List Patient Active Problem List   Diagnosis Date Noted  . Dizziness 11/03/2015  . Hypertension 12/04/2011  . Hyperlipidemia 12/04/2011  . Syncope 12/04/2011  . Diabetes mellitus ([place]) 12/04/2011  . Smoking hx 12/04/2011   Lady Deutscher PT, DPT Lady Deutscher 12/14/2015, 3:49 PM  [place] MA[facility] SERVICES [address] [place], [place], [address] Phone: [phone]   Fax:  [phone]  Name: Claire Washington MRN: [id number] Date of Birth: Aug 14, 1924

## 2015-12-17 ENCOUNTER — Ambulatory Visit: Payer: Medicare Other | Attending: Internal Medicine | Admitting: Physical Therapy

## 2015-12-17 ENCOUNTER — Encounter: Payer: Self-pay | Admitting: Physical Therapy

## 2015-12-17 VITALS — BP 141/53

## 2015-12-17 DIAGNOSIS — M6281 Muscle weakness (generalized): Secondary | ICD-10-CM | POA: Insufficient documentation

## 2015-12-17 DIAGNOSIS — R262 Difficulty in walking, not elsewhere classified: Secondary | ICD-10-CM | POA: Diagnosis present

## 2015-12-17 DIAGNOSIS — R42 Dizziness and giddiness: Secondary | ICD-10-CM

## 2015-12-17 NOTE — Therapy (Signed)
[place] MAIN [facility] SERVICES [address] [place], [place], [address] Phone: [phone]   Fax:  [phone]  Physical Therapy Treatment  Patient Details  Name: Claire Washington MRN: [id number] Date of Birth: 1925/01/31 Referring Provider: Dr. Ginette Pitman  Encounter Date: 12/17/2015      PT End of Session - 12/17/15 1255    Visit Number 17   Number of Visits 29   Date for PT Re-Evaluation 01-31-16   Authorization Type G codes every 10th visit; G Code visit #9/10   PT Start Time 1033   PT Stop Time 1120   PT Time Calculation (min) 47 min   Equipment Utilized During Treatment Gait belt   Activity Tolerance Patient tolerated treatment well   Behavior During Therapy [facility] for tasks assessed/performed      Past Medical History:  Diagnosis Date  . Cancer ([place])    basel cell  . Chronic cystitis   . Hyperlipidemia   . Hypertension   . Leg pain   . Osteoarthritis   . Small vessel disease ([place])   . Syncope and collapse 2013  . Thyroid nodule   . Thyroid nodule   . Vertigo     Past Surgical History:  Procedure Laterality Date  . BILATERAL SALPINGOOPHORECTOMY    . COLONOSCOPY    . CYSTOSCOPY    . TOTAL ABDOMINAL HYSTERECTOMY      Vitals:   12/17/15 1044  BP: (!) 141/53        Subjective Assessment - 12/17/15 1253    Subjective Pt states she did not do her exercises yesterday but states she has been good about doing her exercises on the other days. Patient states she feels she is doing better and states she is having a good day today.    Pertinent History Pt reports that she has been having problems with her balance for an extended period of time but progressively worsened recently. She also reports weakness with ascending stairs and finds herself needing to pull up on railings. She reports that she is very unsteady while descending stairs, ramps, and curbs. She uses a rollator for long distance community ambulation. For short  distance community ambulation she uses a single point cane. At home she furniture walks but does not use an assistive device. She has a history of dizziness and has participated in vestibular rehab in the past which was helpful. Pt unable to recall her vestibular diagnosis. Over the last 1.5 weeks she has started performing VOR x 1 horizontal exercise at home. She has not noticed any improvement in her dizziness since starting the exercise. She has one episode of syncope in the setting of GI issues/constipation in 2013. Pt denies falls in the last 12 months however states, "I am very careful." She reports that she has limited her activities due her balance/dizziness complaints.    Patient Stated Goals Improve balance and decrease risk for falls   Currently in Pain? No/denies      Neuromuscular Re-education:   Rockerboard: On small wooden rocker board, worked on [employer] and A/P sways with and without holding a ball with UEs and moving side to side. Then, added head following ball target. On medium wooden rocker board, worked on [employer] and A/P sways and added holding ball with UEs and moving ball side to side while doing SS sways.   On [employer] balance beam: Pt performed sidestepping L/R 6 reps times 5' each.    Ther-ex: Green tband resisted sidestepping 8'  x 4 lengths Seated clams with green tband 1 x 15; 1 X 20 reps Seated marches with green tband 2 x 20 reps Issued Green Theraband for home use.      PT Education - 12/17/15 1254    Education provided Yes   Education Details discussed theraband exercises   Person(s) Educated Patient   Methods Explanation;Demonstration;Verbal cues   Comprehension Verbalized understanding;Returned demonstration             PT Long Term Goals - 12/07/15 1147      PT LONG TERM GOAL #1   Title Pt will be independent with HEP in order to reduce fall risk, improve strength, and improve function at home   Time 6   Period Weeks   Status On-going     PT LONG  TERM GOAL #2   Title Pt will improve BERG by at least 5 points in order to demonstrate significant improvement in balance and decrease fall risk   Baseline 10/12/15: 21/56, 11/16/15: 36/56, 12/07/15:34/56   Time 6   Period Weeks   Status Partially Met     PT LONG TERM GOAL #3   Title Pt will improve 5TSTS by at least 2.3 seconds in order to demonstrates clinically significant improvement in LE strength and balance   Baseline 10/12/15: 18.0 seconds, 11/16/15: 12.4 seconds   Time 6   Period Weeks   Status Achieved     PT LONG TERM GOAL #4   Title Pt will decrease DHI by at least 18 points in order to demonstrate clinically significant reduction in perception of handicap   Baseline 10/12/15: 52/100, 11/16/15: 48/100, 12/07/15:52/100   Time 6   Period Weeks   Status On-going     PT LONG TERM GOAL #5   Title Pt will demonstrate increase in ABC by at least 13% in order to demonstrate significant improvement in balance confidence and decrease risk for falls   Baseline 10/12/15: 28.75%, 11/16/15: 30%, 12/07/15:30%   Time 6   Period Weeks   Status On-going               Plan - 12/17/15 1255    Clinical Impression Statement Patient demonstrated improvements with being better able to self correct with loss of balance on small wooden rockerboard and was able to progress to adding UE movement and hand/eye tracking as well as working on medium rockerboard this date. Pt had the most difficulty with posterior sways. Patient reports she has been doing seated marching and clam shells with theraband at home but increased number of repetitions and added standing side stepping at kitchen counter. Pt provided with a green theraband. Encouraged patient to continue to follow up as indicated.    Rehab Potential Fair   Clinical Impairments Affecting Rehab Potential Positive: motivation, good safety awareness; Negative: chronicity   PT Frequency 2x / week   PT Duration 6 weeks   PT Treatment/Interventions ADLs/Self  Care Home Management;Aquatic Therapy;Canalith Repostioning;Cryotherapy;Electrical Stimulation;Iontophoresis 4mg/ml Dexamethasone;Moist Heat;Traction;Ultrasound;DME Instruction;Gait training;Stair training;Functional mobility training;Therapeutic activities;Therapeutic exercise;Balance training;Neuromuscular re-education;Patient/family education;Manual techniques;Energy conservation;Vestibular   PT Next Visit Plan continue curb training, dynamic balance, ankle strategies on rocker board, stepping strategies, progress hip and glut strength   PT Home Exercise Plan VOR x 1 horizontal in sitting 60 seconds x 2, vertrical x 2, 4 times/day (not currently progressed to standing due to poor technique and instability), sit to stand without UE support from elevated chair, feet apart standing with horizontal and vertical head turns   Consulted and   Agree with Plan of Care Patient      Patient will benefit from skilled therapeutic intervention in order to improve the following deficits and impairments:  Decreased balance, Decreased mobility, Decreased strength, Difficulty walking, Dizziness  Visit Diagnosis: Muscle weakness (generalized)  Difficulty in walking, not elsewhere classified  Dizziness and giddiness     Problem List Patient Active Problem List   Diagnosis Date Noted  . Dizziness 11/03/2015  . Hypertension 12/04/2011  . Hyperlipidemia 12/04/2011  . Syncope 12/04/2011  . Diabetes mellitus ([facility]) 12/04/2011  . Smoking hx 12/04/2011   Dorriea Murphy PT, DPT Murphy,Dorriea 12/17/2015, 1:06 PM  [facility] MA[facility] REHAB SERVICES [address] [place], [place], [address] Phone: [phone]   Fax:  [phone]  Name: Claire Washington MRN: [id number] Date of Birth: 12/16/1924   

## 2015-12-22 ENCOUNTER — Ambulatory Visit: Payer: Medicare Other | Admitting: Physical Therapy

## 2015-12-22 ENCOUNTER — Encounter: Payer: Self-pay | Admitting: Physical Therapy

## 2015-12-22 VITALS — BP 144/47 | HR 64

## 2015-12-22 DIAGNOSIS — M6281 Muscle weakness (generalized): Secondary | ICD-10-CM | POA: Diagnosis not present

## 2015-12-22 DIAGNOSIS — R42 Dizziness and giddiness: Secondary | ICD-10-CM

## 2015-12-22 DIAGNOSIS — R262 Difficulty in walking, not elsewhere classified: Secondary | ICD-10-CM

## 2015-12-22 NOTE — Therapy (Signed)
[place] MAIN [facility] SERVICES [address] [place], [place], [address] Phone: [phone]   Fax:  [phone]  Physical Therapy Treatment/Progress Notes   Patient Details  Name: Claire Washington MRN: [id number] Date of Birth: 1924-12-12 Referring Provider: Dr. Ginette Pitman  Encounter Date: 12/22/2015      PT End of Session - 12/22/15 1411    Visit Number 18   Number of Visits 29   Date for PT Re-Evaluation 02/13/2016   Authorization Type G codes every 10th visit; G Code visit #10/10   PT Start Time 1300   PT Stop Time 1345   PT Time Calculation (min) 45 min   Equipment Utilized During Treatment Gait belt   Activity Tolerance Patient tolerated treatment well   Behavior During Therapy [facility] for tasks assessed/performed      Past Medical History:  Diagnosis Date  . Cancer ([place])    basel cell  . Chronic cystitis   . Hyperlipidemia   . Hypertension   . Leg pain   . Osteoarthritis   . Small vessel disease ([place])   . Syncope and collapse 2013  . Thyroid nodule   . Thyroid nodule   . Vertigo     Past Surgical History:  Procedure Laterality Date  . BILATERAL SALPINGOOPHORECTOMY    . COLONOSCOPY    . CYSTOSCOPY    . TOTAL ABDOMINAL HYSTERECTOMY      Vitals:   12/22/15 1310  BP: (!) 144/47  Pulse: 64  SpO2: 95%        Subjective Assessment - 12/22/15 1304    Subjective Pt reports that she did have a dizzy spell Sunday that wore off throughout the day but "it could of been something she did" She also reports that her knee has been feeling good and has no complaints regarding it.     Pertinent History Pt reports that she has been having problems with her balance for an extended period of time but progressively worsened recently. She also reports weakness with ascending stairs and finds herself needing to pull up on railings. She reports that she is very unsteady while descending stairs, ramps, and curbs. She uses a rollator for long  distance community ambulation. For short distance community ambulation she uses a single point cane. At home she furniture walks but does not use an assistive device. She has a history of dizziness and has participated in vestibular rehab in the past which was helpful. Pt unable to recall her vestibular diagnosis. Over the last 1.5 weeks she has started performing VOR x 1 horizontal exercise at home. She has not noticed any improvement in her dizziness since starting the exercise. She has one episode of syncope in the setting of GI issues/constipation in 2013. Pt denies falls in the last 12 months however states, "I am very careful." She reports that she has limited her activities due her balance/dizziness complaints.    Patient Stated Goals Improve balance and decrease risk for falls   Currently in Pain? No/denies           Treatment Administered ABC Scale-32% compared to 30% last progress check  Administered DHI Scale-48/100  BERG balance test to assess progress-38/65 compared to 34/56 since last check  5 time sit to stand, 15.81 seconds without UE support  10 m walk test in 1.72ms indicating full community ambulator  [facility] squats off floor x 10 squats to work on picking items off floor without becoming more dizzy, instructed in deep squat  and to maintain eye gaze forward, min VCs to increase glut activation standing up tall  Curb training on 6 inch curb step with R SPC x 10 reps, CGA to ascend curb and min A to descend curb, pt demonstrates anterior/posterior sway when descending curb, min VCs to keep eye gaze forward when descending to reduce dizziness Leg Press, #90 2 sets x 10 reps BLEs, min tactile cues to reduce terminal knee extension and min VCs to increase eccentric control  Leg Press, single leg ,#45, 1 set x 10 reps, min tactile cues to reduce terminal knee extension                      PT Education - 12/22/15 1411    Education provided Yes   Education Details  continuation of HEP, curb training    Person(s) Educated Patient   Methods Explanation;Demonstration;Verbal cues   Comprehension Verbalized understanding;Returned demonstration;Verbal cues required             PT Long Term Goals - 12/22/15 1429      PT LONG TERM GOAL #1   Title Pt will be independent with HEP in order to reduce fall risk, improve strength, and improve function at home   Time 6   Period Weeks   Status On-going     PT LONG TERM GOAL #2   Title Pt will improve BERG by at least 5 points in order to demonstrate significant improvement in balance and decrease fall risk   Baseline 10/12/15: 21/56, 11/16/15: 36/56, 12/07/15:34/56, 12/22/15 38/56   Time 6   Period Weeks   Status Partially Met     PT LONG TERM GOAL #3   Title Pt will improve 5TSTS by at least 2.3 seconds in order to demonstrates clinically significant improvement in LE strength and balance   Baseline 10/12/15: 18.0 seconds, 11/16/15: 12.4 seconds, 12/22/15:15.52 seconds    Time 6   Period Weeks   Status Achieved     PT LONG TERM GOAL #4   Title Pt will decrease DHI by at least 18 points in order to demonstrate clinically significant reduction in perception of handicap   Baseline 10/12/15: 52/100, 11/16/15: 48/100, 12/07/15:52/100, 12/22/15 40/56   Time 6   Period Weeks   Status On-going     PT LONG TERM GOAL #5   Title Pt will demonstrate increase in ABC by at least 13% in order to demonstrate significant improvement in balance confidence and decrease risk for falls   Baseline 10/12/15: 28.75%, 11/16/15: 30%, 12/07/15:30%, 12/22/15:32%   Time 6   Period Weeks   Status On-going               Plan - 12/22/15 1412    Clinical Impression Statement Pt demonstrated improvements in outcome measures this visit.  Pt performed a 38/56 on BERG compared to 34/56 on last progress check.  Pt increased her 31 m walk speed to 1.45ms with the assist of her rollator indicating she is a full community ambulator.  Pt performed  5 time sit to stand in 15.52 seconds compared to last session which was 13.54 seconds which is not much of a difference.  She reports less overall dizziness disability of 48/100 and greater confidence in balance from 30% to 32%.  Curb training on 6 inch step was initated today and pt was able to ascend with only CGA for safety but required min A for descending and demonstrated increased anterior/posterior sway.  She would continue to  benefit from further skilled PT to increase LE strength and dynamic balance for greater functional mobility.     Rehab Potential Fair   Clinical Impairments Affecting Rehab Potential Positive: motivation, good safety awareness; Negative: chronicity   PT Frequency 2x / week   PT Duration 6 weeks   PT Treatment/Interventions ADLs/Self Care Home Management;Aquatic Therapy;Canalith Repostioning;Cryotherapy;Electrical Stimulation;Iontophoresis '4mg'$ /ml Dexamethasone;Moist Heat;Traction;Ultrasound;DME Instruction;Gait training;Stair training;Functional mobility training;Therapeutic activities;Therapeutic exercise;Balance training;Neuromuscular re-education;Patient/family education;Manual techniques;Energy conservation;Vestibular   PT Next Visit Plan curb and ramp training outside, stepping strategies, progress hip and glut strength    PT Home Exercise Plan VOR x 1 horizontal in sitting 60 seconds x 2, vertrical x 2, 4 times/day (not currently progressed to standing due to poor technique and instability), sit to stand without UE support from elevated chair, feet apart standing with horizontal and vertical head turns   Consulted and Agree with Plan of Care Patient      Patient will benefit from skilled therapeutic intervention in order to improve the following deficits and impairments:  Decreased balance, Decreased mobility, Decreased strength, Difficulty walking, Dizziness  Visit Diagnosis: Muscle weakness (generalized)  Difficulty in walking, not elsewhere  classified  Dizziness and giddiness       G-Codes - 12/23/15 1700    Functional Assessment Tool Used clinical judgement, DHI, BERG, ABC, 66mgait speed, TUG, 5TSTS   Functional Limitation Mobility: Walking and moving around   Mobility: Walking and Moving Around Current Status [phone] At least 20 percent but less than 40 percent impaired, limited or restricted   Mobility: Walking and Moving Around Goal Status [phone] At least 1 percent but less than 20 percent impaired, limited or restricted      Problem List Patient Active Problem List   Diagnosis Date Noted  . Dizziness 11/03/2015  . Hypertension 12/04/2011  . Hyperlipidemia 12/04/2011  . Syncope 12/04/2011  . Diabetes [place] ([place] 12/04/2011  . Smoking hx 12/04/2011   TStacy Gardner SPT  This entire session was performed under direct supervision and direction of a licensed therapist/therapist assistant . I have personally read, edited and approve of the note as written.  Trotter,Margaret PT, DPT 12/23/2015, 8:48 AM  [place] [address] [address]Phone: [phone]  Fax:  [phone] Name: DDaisia SlomskiMRN: [id number] of Birth: [birth year]

## 2015-12-24 ENCOUNTER — Encounter: Payer: Self-pay | Admitting: Physical Therapy

## 2015-12-24 ENCOUNTER — Ambulatory Visit: Payer: Medicare Other | Admitting: Physical Therapy

## 2015-12-24 DIAGNOSIS — M6281 Muscle weakness (generalized): Secondary | ICD-10-CM | POA: Diagnosis not present

## 2015-12-24 DIAGNOSIS — R262 Difficulty in walking, not elsewhere classified: Secondary | ICD-10-CM

## 2015-12-24 DIAGNOSIS — R42 Dizziness and giddiness: Secondary | ICD-10-CM

## 2015-12-24 NOTE — Therapy (Signed)
[facility] MAIN [facility] SERVICES [address] [place], [place], [address] Phone: [phone]   Fax:  [phone]  Physical Therapy Treatment  Patient Details  Name: Claire Washington MRN: [id number] Date of Birth: 06/09/1924 Referring Provider: Dr. Marcello Fennel  Encounter Date: 12/24/2015      PT End of Session - 12/24/15 1156    Visit Number 19   Number of Visits 29   Date for PT Re-Evaluation 2016/02/05   Authorization Type G codes every 10th visit; G Code visit #1/10   PT Start Time 0920   PT Stop Time 1000   PT Time Calculation (min) 40 min   Equipment Utilized During Treatment Gait belt   Activity Tolerance Patient tolerated treatment well   Behavior During Therapy [facility] for tasks assessed/performed      Past Medical History:  Diagnosis Date  . Cancer ([facility])    basel cell  . Chronic cystitis   . Hyperlipidemia   . Hypertension   . Leg pain   . Osteoarthritis   . Small vessel disease ([facility])   . Syncope and collapse 2013  . Thyroid nodule   . Thyroid nodule   . Vertigo     Past Surgical History:  Procedure Laterality Date  . BILATERAL SALPINGOOPHORECTOMY    . COLONOSCOPY    . CYSTOSCOPY    . TOTAL ABDOMINAL HYSTERECTOMY      There were no vitals filed for this visit.      Subjective Assessment - 12/24/15 1155    Subjective Pt reports that her knees felt fine after leg press last session and she has not had any new dizzy spells since previous visit.  She continues to be compliant with her HEP.   Pertinent History Pt reports that she has been having problems with her balance for an extended period of time but progressively worsened recently. She also reports weakness with ascending stairs and finds herself needing to pull up on railings. She reports that she is very unsteady while descending stairs, ramps, and curbs. She uses a rollator for long distance community ambulation. For short distance community ambulation she uses a single  point cane. At home she furniture walks but does not use an assistive device. She has a history of dizziness and has participated in vestibular rehab in the past which was helpful. Pt unable to recall her vestibular diagnosis. Over the last 1.5 weeks she has started performing VOR x 1 horizontal exercise at home. She has not noticed any improvement in her dizziness since starting the exercise. She has one episode of syncope in the setting of GI issues/constipation in 2013. Pt denies falls in the last 12 months however states, "I am very careful." She reports that she has limited her activities due her balance/dizziness complaints.    Patient Stated Goals Improve balance and decrease risk for falls   Currently in Pain? No/denies      Treatment Seated resisted hamstring curls with red theraband resistance, 2 sets x 10 reps BLEs, min VCs for slow eccentric control and upright posture throughout  Wall squats with therapy ball support, 3 sets x 10 reps, min VCs for initial positioning and cues to maintain space between knees   Curb training: Instructed patient in curb step outside x 10 attempts ascending and descending with uneven surface using SPC. Pt required CGA for safety with several LOB posteriorly when descending curb.  Pt given min VCs for curb training to use momentum and take larger more  powerful steps ascending and maintaining forward eye gaze when descending and ensuring that feet were on level surface close to curb.  Pt demonstrated several attempts ascending/descending without LOB and with CGA for safety  Ramp training: Instructed patient in ascending/descending ramp with SPC.  Pt demonstrated no difficulty ascending curb with close supervision.  Pt required CGA for descending ramp with several LOB throughout that were corrected by therapist.  Min VCs to slow down and take time to pause and re steady before continuing walking down ramp.  Min VCs for forward eye gaze when descending ramp.     Walking through grass with SPC (approximately 75 feet) for uneven surface ambulation practice.  Pt demonstrated adequate technique and sure footing walking through grass with SPC and CGA for safety.  Pt was able to navigate slight slopes and uneven surface without LOB or unsteadiness.                             PT Education - 12/24/15 1155    Education provided Yes   Education Details curb training, safety on ramps and in grass    Person(s) Educated Patient   Methods Explanation;Demonstration;Verbal cues   Comprehension Verbalized understanding;Returned demonstration;Verbal cues required             PT Long Term Goals - 12/22/15 1429      PT LONG TERM GOAL #1   Title Pt will be independent with HEP in order to reduce fall risk, improve strength, and improve function at home   Time 6   Period Weeks   Status On-going     PT LONG TERM GOAL #2   Title Pt will improve BERG by at least 5 points in order to demonstrate significant improvement in balance and decrease fall risk   Baseline 10/12/15: 21/56, 11/16/15: 36/56, 12/07/15:34/56, 12/22/15 38/56   Time 6   Period Weeks   Status Partially Met     PT LONG TERM GOAL #3   Title Pt will improve 5TSTS by at least 2.3 seconds in order to demonstrates clinically significant improvement in LE strength and balance   Baseline 10/12/15: 18.0 seconds, 11/16/15: 12.4 seconds, 12/22/15:15.52 seconds    Time 6   Period Weeks   Status Achieved     PT LONG TERM GOAL #4   Title Pt will decrease DHI by at least 18 points in order to demonstrate clinically significant reduction in perception of handicap   Baseline 10/12/15: 52/100, 11/16/15: 48/100, 12/07/15:52/100, 12/22/15 40/56   Time 6   Period Weeks   Status On-going     PT LONG TERM GOAL #5   Title Pt will demonstrate increase in ABC by at least 13% in order to demonstrate significant improvement in balance confidence and decrease risk for falls   Baseline 10/12/15: 28.75%,  11/16/15: 30%, 12/07/15:30%, 12/22/15:32%   Time 6   Period Weeks   Status On-going               Plan - 12/24/15 1156    Clinical Impression Statement Pt demonstrated good carry over with curb training outside after inside practice.  Pt was able to ascend/descend full curb but continues to require CGA for occasional LOB posteriorly.  Pt was challenged by uneven surfaces of outside curb compared to inside.  She required min VCs to use momentum and take a larger more powerful step to ascend curb and required min VCs to keep eye gaze forward when  descending to decrease dizziness.  Pt ambulated up and down long ramp x 2 with cane, she was able to ascend without LOB and required CGA to descend due to occasional LOB.  Pt required min VCs to pause, and resteady herself when descending after slight LOB.  Pt ambulated through grass with SPC with CGA and demonstrated adequate technique without LOB.  She would continue to benefit from skilled PT to increase her LE strength, and balance for greater ability to perform functional tasks.     Rehab Potential Fair   Clinical Impairments Affecting Rehab Potential Positive: motivation, good safety awareness; Negative: chronicity   PT Frequency 2x / week   PT Duration 6 weeks   PT Treatment/Interventions ADLs/Self Care Home Management;Aquatic Therapy;Canalith Repostioning;Cryotherapy;Electrical Stimulation;Iontophoresis '4mg'$ /ml Dexamethasone;Moist Heat;Traction;Ultrasound;DME Instruction;Gait training;Stair training;Functional mobility training;Therapeutic activities;Therapeutic exercise;Balance training;Neuromuscular re-education;Patient/family education;Manual techniques;Energy conservation;Vestibular   PT Next Visit Plan stepping strategies, hamstring, glut and quad strength    PT Home Exercise Plan VOR x 1 horizontal in sitting 60 seconds x 2, vertrical x 2, 4 times/day (not currently progressed to standing due to poor technique and instability), sit to stand  without UE support from elevated chair, feet apart standing with horizontal and vertical head turns   Consulted and Agree with Plan of Care Patient      Patient will benefit from skilled therapeutic intervention in order to improve the following deficits and impairments:  Decreased balance, Decreased mobility, Decreased strength, Difficulty walking, Dizziness  Visit Diagnosis: Muscle weakness (generalized)  Difficulty in walking, not elsewhere classified  Dizziness and giddiness     Problem List Patient Active Problem List   Diagnosis Date Noted  . Dizziness 11/03/2015  . Hypertension 12/04/2011  . Hyperlipidemia 12/04/2011  . Syncope 12/04/2011  . Diabetes mellitus ([place]) 12/04/2011  . Smoking hx 12/04/2011   Stacy Gardner, SPT  This entire session was performed under direct supervision and direction of a licensed therapist/therapist assistant . I have personally read, edited and approve of the note as written. Hillis Range, PT, DPT, [phone] 12/27/15 9:22 AM   [place] [facility] MAIN [facility] SERVICES [address] [place], [place], [address] Phone: [phone]   Fax:  [phone]  Name: Alisen Marsiglia MRN: [id number] Date of Birth: 12-27-1924

## 2015-12-31 ENCOUNTER — Ambulatory Visit: Payer: Medicare Other | Admitting: Physical Therapy

## 2015-12-31 ENCOUNTER — Encounter: Payer: Self-pay | Admitting: Physical Therapy

## 2015-12-31 DIAGNOSIS — R42 Dizziness and giddiness: Secondary | ICD-10-CM

## 2015-12-31 DIAGNOSIS — M6281 Muscle weakness (generalized): Secondary | ICD-10-CM | POA: Diagnosis not present

## 2015-12-31 DIAGNOSIS — R262 Difficulty in walking, not elsewhere classified: Secondary | ICD-10-CM

## 2015-12-31 NOTE — Therapy (Signed)
[place] MAIN [facility] SERVICES [address] [place], [place], [address] Phone: [phone]   Fax:  [phone]  Physical Therapy Treatment  Patient Details  Name: Claire Washington MRN: [id number] Date of Birth: [birth year] Referring Provider: Dr. Ginette Pitman  Encounter Date: 12/31/2015      PT End of Session - 12/31/15 1350    Visit Number 20   Number of Visits 29   Date for PT Re-Evaluation 02/05/16   Authorization Type G codes every 10th visit; G Code visit #2/10   PT Start Time 0146   PT Stop Time 0226   PT Time Calculation (min) 40 min   Equipment Utilized During Treatment Gait belt   Activity Tolerance Patient tolerated treatment well   Behavior During Therapy [facility] for tasks assessed/performed      Past Medical History:  Diagnosis Date  . Cancer ([place])    basel cell  . Chronic cystitis   . Hyperlipidemia   . Hypertension   . Leg pain   . Osteoarthritis   . Small vessel disease ([place])   . Syncope and collapse 2013  . Thyroid nodule   . Thyroid nodule   . Vertigo     Past Surgical History:  Procedure Laterality Date  . BILATERAL SALPINGOOPHORECTOMY    . COLONOSCOPY    . CYSTOSCOPY    . TOTAL ABDOMINAL HYSTERECTOMY      There were no vitals filed for this visit.      Subjective Assessment - 12/31/15 1350    Subjective Pt reports that she has not had any falls this past week. Reports compliance with HEP. Pt states that likes to stay active and exercise. Pt had one question about VOR X 1 vert and this was reviewed with patient.    Pertinent History Pt reports that she has been having problems with her balance for an extended period of time but progressively worsened recently. She also reports weakness with ascending stairs and finds herself needing to pull up on railings. She reports that she is very unsteady while descending stairs, ramps, and curbs. She uses a rollator for long distance community ambulation. For short  distance community ambulation she uses a single point cane. At home she furniture walks but does not use an assistive device. She has a history of dizziness and has participated in vestibular rehab in the past which was helpful. Pt unable to recall her vestibular diagnosis. Over the last 1.5 weeks she has started performing VOR x 1 horizontal exercise at home. She has not noticed any improvement in her dizziness since starting the exercise. She has one episode of syncope in the setting of GI issues/constipation in 2013. Pt denies falls in the last 12 months however states, "I am very careful." She reports that she has limited her activities due her balance/dizziness complaints.    Patient Stated Goals Improve balance and decrease risk for falls   Currently in Pain? No/denies      Ther-ex: Quantum leg press 90# 15 reps times 2; patient denies pain  Performed Biodex tower with 2.5 pounds- 3 sets of 10 reps each leg bent knee extensions and straight leg extension.  Patient required vc to perform for technique and to stand erect without bending forward at the waist.Patient denies discomfort with activity.   Performed with Nyoka Cowden T-band alternating sidestepping with mini squats length of // bars multiple 8" reps times 8 with faded UEs support progressing from use of B UEs to no UEs. Patient  did have two small LOB when attempting to perform without UEs assist.  Patient required vc for technique with mini-squats to not bend too far and to not lean forward.         PT Education - 12/31/15 1433    Education provided Yes   Education Details technique with LEs exercises and reviewed VOR X 1 vert   Person(s) Educated Patient   Methods Explanation;Demonstration   Comprehension Verbalized understanding;Returned demonstration             PT Long Term Goals - 12/22/15 1429      PT LONG TERM GOAL #1   Title Pt will be independent with HEP in order to reduce fall risk, improve strength, and improve  function at home   Time 6   Period Weeks   Status On-going     PT LONG TERM GOAL #2   Title Pt will improve BERG by at least 5 points in order to demonstrate significant improvement in balance and decrease fall risk   Baseline 10/12/15: 21/56, 11/16/15: 36/56, 12/07/15:34/56, 12/22/15 38/56   Time 6   Period Weeks   Status Partially Met     PT LONG TERM GOAL #3   Title Pt will improve 5TSTS by at least 2.3 seconds in order to demonstrates clinically significant improvement in LE strength and balance   Baseline 10/12/15: 18.0 seconds, 11/16/15: 12.4 seconds, 12/22/15:15.52 seconds    Time 6   Period Weeks   Status Achieved     PT LONG TERM GOAL #4   Title Pt will decrease DHI by at least 18 points in order to demonstrate clinically significant reduction in perception of handicap   Baseline 10/12/15: 52/100, 11/16/15: 48/100, 12/07/15:52/100, 12/22/15 40/56   Time 6   Period Weeks   Status On-going     PT LONG TERM GOAL #5   Title Pt will demonstrate increase in ABC by at least 13% in order to demonstrate significant improvement in balance confidence and decrease risk for falls   Baseline 10/12/15: 28.75%, 11/16/15: 30%, 12/07/15:30%, 12/22/15:32%   Time 6   Period Weeks   Status On-going               Plan - 12/31/15 1351    Clinical Impression Statement Patient continues to make progress with strengthening and she was able to perform hamstring and glut ther ex using Biodex tower with UEs support for balance this date. Patient needed min verbal cuing for technique with ther ex this date to control speed of movement and to keep upper body erect. Patient would benefit from contined PT services to continue working and balance and strengthening exercises and to try to reduce falls risk.    Rehab Potential Fair   Clinical Impairments Affecting Rehab Potential Positive: motivation, good safety awareness; Negative: chronicity   PT Frequency 2x / week   PT Duration 6 weeks   PT  Treatment/Interventions ADLs/Self Care Home Management;Aquatic Therapy;Canalith Repostioning;Cryotherapy;Electrical Stimulation;Iontophoresis '4mg'$ /ml Dexamethasone;Moist Heat;Traction;Ultrasound;DME Instruction;Gait training;Stair training;Functional mobility training;Therapeutic activities;Therapeutic exercise;Balance training;Neuromuscular re-education;Patient/family education;Manual techniques;Energy conservation;Vestibular   PT Next Visit Plan stepping strategies, hamstring, glut and quad strength progressions, and consider practicing outside curb again   PT Home Exercise Plan VOR x 1 horizontal in sitting 60 seconds x 2, vertrical x 2, 4 times/day (not currently progressed to standing due to poor technique and instability), sit to stand without UE support from elevated chair, feet apart standing with horizontal and vertical head turns   Consulted and Agree with Plan  of Care Patient      Patient will benefit from skilled therapeutic intervention in order to improve the following deficits and impairments:  Decreased balance, Decreased mobility, Decreased strength, Difficulty walking, Dizziness  Visit Diagnosis: Muscle weakness (generalized)  Difficulty in walking, not elsewhere classified  Dizziness and giddiness     Problem List Patient Active Problem List   Diagnosis Date Noted  . Dizziness 11/03/2015  . Hypertension 12/04/2011  . Hyperlipidemia 12/04/2011  . Syncope 12/04/2011  . Diabetes mellitus ([place]) 12/04/2011  . Smoking hx 12/04/2011    Tammee Thielke 12/31/2015, 2:58 PM  [place] MAIN [facility] SERVICES [address] [place], [place], [address] Phone: [phone]   Fax:  [phone]  Name: Abagayle Klutts MRN: [id number] Date of Birth: [birth year]

## 2016-01-03 ENCOUNTER — Ambulatory Visit: Payer: Medicare Other | Admitting: Physical Therapy

## 2016-01-05 ENCOUNTER — Ambulatory Visit: Payer: Medicare Other | Admitting: Physical Therapy

## 2016-01-05 DIAGNOSIS — R42 Dizziness and giddiness: Secondary | ICD-10-CM

## 2016-01-05 DIAGNOSIS — M6281 Muscle weakness (generalized): Secondary | ICD-10-CM

## 2016-01-05 DIAGNOSIS — R262 Difficulty in walking, not elsewhere classified: Secondary | ICD-10-CM

## 2016-01-05 NOTE — Therapy (Signed)
[place] MAIN [facility] SERVICES [address] [place], [place], [address] Phone: [phone]   Fax:  [phone]  Physical Therapy Treatment  Patient Details  Name: Claire Washington MRN: [id number] Date of Birth: 1924/10/26 Referring Provider: Dr. Ginette Pitman  Encounter Date: 01/05/2016      PT End of Session - 01/05/16 1205    Visit Number 21   Number of Visits 29   Date for PT Re-Evaluation Jan 19, 2016   Authorization Type G codes every 10th visit; G Code visit #3/10   PT Start Time 1115   PT Stop Time 1200   PT Time Calculation (min) 45 min   Equipment Utilized During Treatment Gait belt   Activity Tolerance Patient tolerated treatment well   Behavior During Therapy [facility] for tasks assessed/performed      Past Medical History:  Diagnosis Date  . Cancer ([place])    basel cell  . Chronic cystitis   . Hyperlipidemia   . Hypertension   . Leg pain   . Osteoarthritis   . Small vessel disease ([place])   . Syncope and collapse 2013  . Thyroid nodule   . Thyroid nodule   . Vertigo     Past Surgical History:  Procedure Laterality Date  . BILATERAL SALPINGOOPHORECTOMY    . COLONOSCOPY    . CYSTOSCOPY    . TOTAL ABDOMINAL HYSTERECTOMY      There were no vitals filed for this visit.      Subjective Assessment - 01/05/16 1121    Subjective Pt reports a fall on Friday, she reports she was feeling great after therapy on friday and went home and had a fall in her bedroom after she was trying to get into her recliner.  She was close enough to her bed where she was able to climb and pull herself up.     Pertinent History Pt reports that she has been having problems with her balance for an extended period of time but progressively worsened recently. She also reports weakness with ascending stairs and finds herself needing to pull up on railings. She reports that she is very unsteady while descending stairs, ramps, and curbs. She uses a rollator for long  distance community ambulation. For short distance community ambulation she uses a single point cane. At home she furniture walks but does not use an assistive device. She has a history of dizziness and has participated in vestibular rehab in the past which was helpful. Pt unable to recall her vestibular diagnosis. Over the last 1.5 weeks she has started performing VOR x 1 horizontal exercise at home. She has not noticed any improvement in her dizziness since starting the exercise. She has one episode of syncope in the setting of GI issues/constipation in 2013. Pt denies falls in the last 12 months however states, "I am very careful." She reports that she has limited her activities due her balance/dizziness complaints.    Patient Stated Goals Improve balance and decrease risk for falls   Currently in Pain? Yes   Pain Score 3    Pain Location Knee   Pain Orientation Left        Treatment Nustep x 4 mins BUEs/BLEs level 1 (unbilled)  Floor to stand transfer, after pt reported fall at home initated floor to stand transfer for safety.  Pt demonstrated most difficulty turning from supine to prone but needs to do so to be able to come to hands and knees.  Pt required min VCs for  overall technique, min VCs to bring arms back to come to qudruped.  Pt was able to crawl to surface and able to push up from hands and knees in half kneeling position in order to turn to sit.  Floor to stand transfer was practiced x 2.  Pt demonstrated mod I floor to stand transfer without LOB. (therapetuic ex. 25 mins)   Stepping towards stepping stones, forwards and laterally with foot and color called out, 2 sets x 10 reps.  Pt was able to perform without HHA but required min A for stability. Min Vcs to hinge at the hip and perform slight anterior lean instead of posterior LOB.    Overhead ball press standing on blue airex pad, min VCs to increase anterior lean for greater stability, min A from therapist for stability, 1 set x 10  reps, pt required seated rest break after due to increased dizziness.    Soccer ball kicks, 2 sets x 10 reps, pt unable to safely perform without HHA but able to perform with 2 fingers assistance.  Pt demonstrated several mild LOB that were self corrected and CGA from therapist for safety                           PT Education - 01/05/16 1205    Education provided Yes   Education Details floor to stand transfer    Person(s) Educated Patient   Methods Explanation;Demonstration;Tactile cues   Comprehension Verbalized understanding;Returned demonstration;Verbal cues required             PT Long Term Goals - 12/22/15 1429      PT LONG TERM GOAL #1   Title Pt will be independent with HEP in order to reduce fall risk, improve strength, and improve function at home   Time 6   Period Weeks   Status On-going     PT LONG TERM GOAL #2   Title Pt will improve BERG by at least 5 points in order to demonstrate significant improvement in balance and decrease fall risk   Baseline 10/12/15: 21/56, 11/16/15: 36/56, 12/07/15:34/56, 12/22/15 38/56   Time 6   Period Weeks   Status Partially Met     PT LONG TERM GOAL #3   Title Pt will improve 5TSTS by at least 2.3 seconds in order to demonstrates clinically significant improvement in LE strength and balance   Baseline 10/12/15: 18.0 seconds, 11/16/15: 12.4 seconds, 12/22/15:15.52 seconds    Time 6   Period Weeks   Status Achieved     PT LONG TERM GOAL #4   Title Pt will decrease DHI by at least 18 points in order to demonstrate clinically significant reduction in perception of handicap   Baseline 10/12/15: 52/100, 11/16/15: 48/100, 12/07/15:52/100, 12/22/15 40/56   Time 6   Period Weeks   Status On-going     PT LONG TERM GOAL #5   Title Pt will demonstrate increase in ABC by at least 13% in order to demonstrate significant improvement in balance confidence and decrease risk for falls   Baseline 10/12/15: 28.75%, 11/16/15: 30%,  12/07/15:30%, 12/22/15:32%   Time 6   Period Weeks   Status On-going               Plan - 01/05/16 1205    Clinical Impression Statement Pt reported fall on friday trying to get into her recliner.  Today initated floor to stand transfer in case of another fall.  By the end  of session pt was able to perform supine to prone, prone to qudruped, qudruped to half kneel to come to standing mod ind.  Pt demonstrated most difficulty initiating qudruped position.  Static balance was practiced with several activites.  Focused on anterior lean rather than posterior lean during LOB.  Pt was able to self correct some LOB indepently and able to demonstrate slight anterior lean.  She would benefit from continued PT to increase LE strength and balance to reduce falls and increase functional mobility.     Rehab Potential Fair   Clinical Impairments Affecting Rehab Potential Positive: motivation, good safety awareness; Negative: chronicity   PT Frequency 2x / week   PT Duration 6 weeks   PT Treatment/Interventions ADLs/Self Care Home Management;Aquatic Therapy;Canalith Repostioning;Cryotherapy;Electrical Stimulation;Iontophoresis 35m/ml Dexamethasone;Moist Heat;Traction;Ultrasound;DME Instruction;Gait training;Stair training;Functional mobility training;Therapeutic activities;Therapeutic exercise;Balance training;Neuromuscular re-education;Patient/family education;Manual techniques;Energy conservation;Vestibular   PT Next Visit Plan stepping strategies, hamstring, glut and quad strength progressions, and consider practicing outside curb again   PT Home Exercise Plan VOR x 1 horizontal in sitting 60 seconds x 2, vertrical x 2, 4 times/day (not currently progressed to standing due to poor technique and instability), sit to stand without UE support from elevated chair, feet apart standing with horizontal and vertical head turns   Consulted and Agree with Plan of Care Patient      Patient will benefit from skilled  therapeutic intervention in order to improve the following deficits and impairments:  Decreased balance, Decreased mobility, Decreased strength, Difficulty walking, Dizziness  Visit Diagnosis: Difficulty in walking, not elsewhere classified  Dizziness and giddiness  Muscle weakness (generalized)     Problem List Patient Active Problem List   Diagnosis Date Noted  . Dizziness 11/03/2015  . Hypertension 12/04/2011  . Hyperlipidemia 12/04/2011  . Syncope 12/04/2011  . Diabetes mellitus ([place] 12/04/2011  . Smoking hx 12/04/2011   TStacy Gardner SPT  This entire session was performed under direct supervision and direction of a licensed therapist/therapist assistant . I have personally read, edited and approve of the note as written.  Trotter,Margaret PT, DPT 01/06/2016, 8:48 AM  [place] [facility] [address] [address]Phone: [phone]  Fax:  [phone] Name: DOlyvia GopalMRN: [id number] of Birth: [birth year]

## 2016-01-07 ENCOUNTER — Ambulatory Visit: Payer: Medicare Other | Admitting: Physical Therapy

## 2016-01-11 ENCOUNTER — Ambulatory Visit: Payer: BLUE CROSS/BLUE SHIELD | Admitting: Physical Therapy

## 2016-01-12 ENCOUNTER — Ambulatory Visit: Payer: Medicare Other | Admitting: Physical Therapy

## 2016-01-12 ENCOUNTER — Encounter: Payer: Self-pay | Admitting: Physical Therapy

## 2016-01-12 DIAGNOSIS — M6281 Muscle weakness (generalized): Secondary | ICD-10-CM

## 2016-01-12 DIAGNOSIS — R42 Dizziness and giddiness: Secondary | ICD-10-CM

## 2016-01-12 DIAGNOSIS — R262 Difficulty in walking, not elsewhere classified: Secondary | ICD-10-CM

## 2016-01-12 NOTE — Therapy (Signed)
[place] MAIN [facility] SERVICES [address] [place], [place], [address] Phone: [phone]   Fax:  [phone]  Physical Therapy Treatment  Patient Details  Name: Claire Washington MRN: [id number] Date of Birth: May 19, 1924 Referring Provider: Dr. Ginette Pitman  Encounter Date: 01/12/2016      PT End of Session - 01/12/16 1202    Visit Number 22   Number of Visits 29   Date for PT Re-Evaluation February 13, 2016   Authorization Type G codes every 10th visit; G Code visit #4/10   PT Start Time 1045   PT Stop Time 1130   PT Time Calculation (min) 45 min   Equipment Utilized During Treatment Gait belt   Activity Tolerance Patient tolerated treatment well   Behavior During Therapy [facility] for tasks assessed/performed      Past Medical History:  Diagnosis Date  . Cancer ([place])    basel cell  . Chronic cystitis   . Hyperlipidemia   . Hypertension   . Leg pain   . Osteoarthritis   . Small vessel disease ([place])   . Syncope and collapse 2013  . Thyroid nodule   . Thyroid nodule   . Vertigo     Past Surgical History:  Procedure Laterality Date  . BILATERAL SALPINGOOPHORECTOMY    . COLONOSCOPY    . CYSTOSCOPY    . TOTAL ABDOMINAL HYSTERECTOMY      There were no vitals filed for this visit.      Subjective Assessment - 01/12/16 1052    Subjective Pt reports no dizziness or knee pain today, she reports she was holding off on her HEP this past week but will be starting back today.     Pertinent History Pt reports that she has been having problems with her balance for an extended period of time but progressively worsened recently. She also reports weakness with ascending stairs and finds herself needing to pull up on railings. She reports that she is very unsteady while descending stairs, ramps, and curbs. She uses a rollator for long distance community ambulation. For short distance community ambulation she uses a single point cane. At home she furniture  walks but does not use an assistive device. She has a history of dizziness and has participated in vestibular rehab in the past which was helpful. Pt unable to recall her vestibular diagnosis. Over the last 1.5 weeks she has started performing VOR x 1 horizontal exercise at home. She has not noticed any improvement in her dizziness since starting the exercise. She has one episode of syncope in the setting of GI issues/constipation in 2013. Pt denies falls in the last 12 months however states, "I am very careful." She reports that she has limited her activities due her balance/dizziness complaints.    Patient Stated Goals Improve balance and decrease risk for falls   Currently in Pain? No/denies     Treatment Leg Press, BLEs, #75, 2 sets x 10 reps, min VCs for increased knee extension   Leg Press, single leg, #45 1 set x 10 reps BLEs, min VCs for increased eccentric control and proper breathing technique  Sit to stands on stability ball, 1 set x 10 reps, progressed to no HHA, min VCs for nose over toes and increased glut activation  LAQs seated on stability ball, 2 sets x 10 reps, min VCs for 2 second isometric hold, CGA to stabilize ball, intermittent LOB corrected by therapist  Hip flexion marches seated on stability ball, 2 sets x  10 reps, min VCs for increased core activation for stability, min A for greater stability  Curb training outside-Instructed pt how to ascend/descend curb with R SPC and min A, pt prefers and demonstrates better control leading with RLE up and down curb, min VCs for more powerful step and forward gaze, pt demonstrated several LOB posteriorly that PT had to correct, min VCs to increase weight through [facility] and increased step length for proper foot placement (Gait training 25 mins)   Ramp training ascending/descending with RSPC, CGA for stability, min VCs for forward gaze and increased core activation for greater stability, several lateral sways that were able to be corrected by  pt                             PT Education - 01/12/16 1202    Education provided Yes   Education Details curb training and HEP   Person(s) Educated Patient   Methods Explanation;Demonstration;Verbal cues   Comprehension Verbalized understanding;Returned demonstration;Verbal cues required             PT Long Term Goals - 12/22/15 1429      PT LONG TERM GOAL #1   Title Pt will be independent with HEP in order to reduce fall risk, improve strength, and improve function at home   Time 6   Period Weeks   Status On-going     PT LONG TERM GOAL #2   Title Pt will improve BERG by at least 5 points in order to demonstrate significant improvement in balance and decrease fall risk   Baseline 10/12/15: 21/56, 11/16/15: 36/56, 12/07/15:34/56, 12/22/15 38/56   Time 6   Period Weeks   Status Partially Met     PT LONG TERM GOAL #3   Title Pt will improve 5TSTS by at least 2.3 seconds in order to demonstrates clinically significant improvement in LE strength and balance   Baseline 10/12/15: 18.0 seconds, 11/16/15: 12.4 seconds, 12/22/15:15.52 seconds    Time 6   Period Weeks   Status Achieved     PT LONG TERM GOAL #4   Title Pt will decrease DHI by at least 18 points in order to demonstrate clinically significant reduction in perception of handicap   Baseline 10/12/15: 52/100, 11/16/15: 48/100, 12/07/15:52/100, 12/22/15 40/56   Time 6   Period Weeks   Status On-going     PT LONG TERM GOAL #5   Title Pt will demonstrate increase in ABC by at least 13% in order to demonstrate significant improvement in balance confidence and decrease risk for falls   Baseline 10/12/15: 28.75%, 11/16/15: 30%, 12/07/15:30%, 12/22/15:32%   Time 6   Period Weeks   Status On-going               Plan - 01/12/16 1203    Clinical Impression Statement Pt reports she has not been doing her exercises this past week but plans to get back to it.  Initiated seated stability ball exercises for balance,  core activation, LE strength, pt required min A for stability and min VCs for increased core activation.  Continued curb and ramp training outside to track progress. Pt had a more difficult time with curbs today as she reports her legs felt weaker and felt more unsteady.  Pt required min A for stability and min VCs for more forceful step, forward gaze and increased weight bearing through cane.  She would continue to benefit from further skilled PT to increase  LE strength, balance and confidence for more functional mobility.     Rehab Potential Fair   Clinical Impairments Affecting Rehab Potential Positive: motivation, good safety awareness; Negative: chronicity   PT Frequency 2x / week   PT Duration 6 weeks   PT Treatment/Interventions ADLs/Self Care Home Management;Aquatic Therapy;Canalith Repostioning;Cryotherapy;Electrical Stimulation;Iontophoresis '4mg'$ /ml Dexamethasone;Moist Heat;Traction;Ultrasound;DME Instruction;Gait training;Stair training;Functional mobility training;Therapeutic activities;Therapeutic exercise;Balance training;Neuromuscular re-education;Patient/family education;Manual techniques;Energy conservation;Vestibular   PT Next Visit Plan stepping strategies, hamstring, glut and quad strength progressions    PT Home Exercise Plan VOR x 1 horizontal in sitting 60 seconds x 2, vertrical x 2, 4 times/day (not currently progressed to standing due to poor technique and instability), sit to stand without UE support from elevated chair, feet apart standing with horizontal and vertical head turns   Consulted and Agree with Plan of Care Patient      Patient will benefit from skilled therapeutic intervention in order to improve the following deficits and impairments:  Decreased balance, Decreased mobility, Decreased strength, Difficulty walking, Dizziness  Visit Diagnosis: Difficulty in walking, not elsewhere classified  Dizziness and giddiness  Muscle weakness (generalized)     Problem  List Patient Active Problem List   Diagnosis Date Noted  . Dizziness 11/03/2015  . Hypertension 12/04/2011  . Hyperlipidemia 12/04/2011  . Syncope 12/04/2011  . Diabetes mellitus ([place]) 12/04/2011  . Smoking hx 12/04/2011   Stacy Gardner SPT  Trotter,Margaret PT,DPT 01/12/2016, 4:42 PM  [place] MAIN [facility] SERVICES [address] [place], [place], [address] Phone: [phone]   Fax:  [phone]  Name: Claire Washington MRN: [id number] Date of Birth: 1924-09-13

## 2016-01-14 ENCOUNTER — Encounter: Payer: Self-pay | Admitting: Physical Therapy

## 2016-01-14 ENCOUNTER — Ambulatory Visit: Payer: Medicare Other | Admitting: Physical Therapy

## 2016-01-14 DIAGNOSIS — M6281 Muscle weakness (generalized): Secondary | ICD-10-CM

## 2016-01-14 DIAGNOSIS — R42 Dizziness and giddiness: Secondary | ICD-10-CM

## 2016-01-14 DIAGNOSIS — R262 Difficulty in walking, not elsewhere classified: Secondary | ICD-10-CM

## 2016-01-14 NOTE — Therapy (Signed)
[place] [facility] MAIN [facility] SERVICES [address] [place], [place], [address] Phone: [phone]   Fax:  [phone]  Physical Therapy Treatment  Patient Details  Name: Claire Washington MRN: [id number] Date of Birth: Aug 27, 1924 Referring Provider: Dr. Marcello Fennel  Encounter Date: 01/14/2016      PT End of Session - 01/14/16 1033    Visit Number 23   Number of Visits 29   Date for PT Re-Evaluation 2016-02-16   Authorization Type G codes every 10th visit; G Code visit #4/10   PT Start Time 1040   PT Stop Time 1125   PT Time Calculation (min) 45 min   Equipment Utilized During Treatment Gait belt   Activity Tolerance Patient tolerated treatment well   Behavior During Therapy [facility] for tasks assessed/performed      Past Medical History:  Diagnosis Date  . Cancer ([facility])    basel cell  . Chronic cystitis   . Hyperlipidemia   . Hypertension   . Leg pain   . Osteoarthritis   . Small vessel disease ([facility])   . Syncope and collapse 2013  . Thyroid nodule   . Thyroid nodule   . Vertigo     Past Surgical History:  Procedure Laterality Date  . BILATERAL SALPINGOOPHORECTOMY    . COLONOSCOPY    . CYSTOSCOPY    . TOTAL ABDOMINAL HYSTERECTOMY      There were no vitals filed for this visit.      Subjective Assessment - 01/14/16 0842    Subjective Pt reports that she had some soreness after Tuesdays workout which has since resolved.  She is feeling well today with no complaints of pain.     Pertinent History Pt reports that she has been having problems with her balance for an extended period of time but progressively worsened recently. She also reports weakness with ascending stairs and finds herself needing to pull up on railings. She reports that she is very unsteady while descending stairs, ramps, and curbs. She uses a rollator for long distance community ambulation. For short distance community ambulation she uses a single point cane. At home she  furniture walks but does not use an assistive device. She has a history of dizziness and has participated in vestibular rehab in the past which was helpful. Pt unable to recall her vestibular diagnosis. Over the last 1.5 weeks she has started performing VOR x 1 horizontal exercise at home. She has not noticed any improvement in her dizziness since starting the exercise. She has one episode of syncope in the setting of GI issues/constipation in 2013. Pt denies falls in the last 12 months however states, "I am very careful." She reports that she has limited her activities due her balance/dizziness complaints.    Patient Stated Goals Improve balance and decrease risk for falls   Currently in Pain? No/denies      Treatment Nustep x 4 mins BUEs/BLEs level 2 (unbilled)  Sit to stands on stability ball, 2 sets x 5 reps, min VCs for upright posture, progressed to no HHA LAQS seated on stability ball, 2 sets x 10 reps, min VCs for 3 second isometric hold  Hip flexion marches seated on stability ball, 2 sets x 10 reps, min VCs for increased hip flexion ROM  Oblique twists with wand on therapy ball, 2 sets x 10 reps,  #2, min VCs to increase trunk rotation  Dead bug seated on stability ball, 2 sets x 10 reps, min VCs to  increase core activation  Step taps towards stepping stones with 2 finger assist, CGA from therapist, 2 sets x 10 reps progressing to stepping to three cones before placing foot down Lateral shifts on rocker board, 2 sets x 10 reps, CGA for stability, min VCs to maintain slight knee flexion throughout, required 1 HHA  Scapular retractions standing on airex pad with yellow theraband resistance, 2 sets x 10 reps, min A for stability, min VCs to increase anterior tilt and decrease posterior lean                            PT Education - 01/14/16 0843    Education provided Yes   Education Details mechanism of soreness after exercise    Person(s) Educated Patient   Methods  Demonstration;Explanation;Verbal cues   Comprehension Returned demonstration;Verbalized understanding;Verbal cues required             PT Long Term Goals - 12/22/15 1429      PT LONG TERM GOAL #1   Title Pt will be independent with HEP in order to reduce fall risk, improve strength, and improve function at home   Time 6   Period Weeks   Status On-going     PT LONG TERM GOAL #2   Title Pt will improve BERG by at least 5 points in order to demonstrate significant improvement in balance and decrease fall risk   Baseline 10/12/15: 21/56, 11/16/15: 36/56, 12/07/15:34/56, 12/22/15 38/56   Time 6   Period Weeks   Status Partially Met     PT LONG TERM GOAL #3   Title Pt will improve 5TSTS by at least 2.3 seconds in order to demonstrates clinically significant improvement in LE strength and balance   Baseline 10/12/15: 18.0 seconds, 11/16/15: 12.4 seconds, 12/22/15:15.52 seconds    Time 6   Period Weeks   Status Achieved     PT LONG TERM GOAL #4   Title Pt will decrease DHI by at least 18 points in order to demonstrate clinically significant reduction in perception of handicap   Baseline 10/12/15: 52/100, 11/16/15: 48/100, 12/07/15:52/100, 12/22/15 40/56   Time 6   Period Weeks   Status On-going     PT LONG TERM GOAL #5   Title Pt will demonstrate increase in ABC by at least 13% in order to demonstrate significant improvement in balance confidence and decrease risk for falls   Baseline 10/12/15: 28.75%, 11/16/15: 30%, 12/07/15:30%, 12/22/15:32%   Time 6   Period Weeks   Status On-going               Plan - 01/14/16 1034    Clinical Impression Statement Pt reports muscle soreness since previous session that has resolved today.  Continued seated stability core and balance exercises, pt demonstrated decrease posterior lean today and greater ability to attempt without any HHA throughout.  Pt demonstrated improvement in single leg balance stepping towards stepping stones and was able to step to  three stones before planting foot.  She demonstrated difficulty finding balance point performing scapular retractions on airex pad.  She would continue to benefit from skilled PT to address static and dynamic balance as well as strength to increase her functional mobility.     Rehab Potential Fair   Clinical Impairments Affecting Rehab Potential Positive: motivation, good safety awareness; Negative: chronicity   PT Frequency 2x / week   PT Duration 6 weeks   PT Treatment/Interventions ADLs/Self Care Home Management;Aquatic  Therapy;Canalith Repostioning;Cryotherapy;Electrical Stimulation;Iontophoresis '4mg'$ /ml Dexamethasone;Moist Heat;Traction;Ultrasound;DME Instruction;Gait training;Stair training;Functional mobility training;Therapeutic activities;Therapeutic exercise;Balance training;Neuromuscular re-education;Patient/family education;Manual techniques;Energy conservation;Vestibular   PT Next Visit Plan strengthening, balance on compliant surfaces    PT Home Exercise Plan VOR x 1 horizontal in sitting 60 seconds x 2, vertrical x 2, 4 times/day (not currently progressed to standing due to poor technique and instability), sit to stand without UE support from elevated chair, feet apart standing with horizontal and vertical head turns   Consulted and Agree with Plan of Care Patient      Patient will benefit from skilled therapeutic intervention in order to improve the following deficits and impairments:  Decreased balance, Decreased mobility, Decreased strength, Difficulty walking, Dizziness  Visit Diagnosis: Difficulty in walking, not elsewhere classified  Dizziness and giddiness  Muscle weakness (generalized)     Problem List Patient Active Problem List   Diagnosis Date Noted  . Dizziness 11/03/2015  . Hypertension 12/04/2011  . Hyperlipidemia 12/04/2011  . Syncope 12/04/2011  . Diabetes mellitus ([place]) 12/04/2011  . Smoking hx 12/04/2011   Stacy Gardner, SPT  This entire session  was performed under direct supervision and direction of a licensed therapist/therapist assistant . I have personally read, edited and approve of the note as written.  Trotter,Margaret PT, DPT 01/14/2016, 10:40 AM  [place] MAIN [facility] SERVICES [place], [place], [address] Phone: [phone]   Fax:  [phone]  Name: Claire Washington MRN: [id number] Date of Birth: June 09, 1924

## 2016-01-17 ENCOUNTER — Ambulatory Visit: Payer: Medicare Other | Attending: Internal Medicine | Admitting: Physical Therapy

## 2016-01-17 ENCOUNTER — Encounter: Payer: Self-pay | Admitting: Physical Therapy

## 2016-01-17 DIAGNOSIS — R42 Dizziness and giddiness: Secondary | ICD-10-CM | POA: Diagnosis present

## 2016-01-17 DIAGNOSIS — M6281 Muscle weakness (generalized): Secondary | ICD-10-CM | POA: Diagnosis present

## 2016-01-17 DIAGNOSIS — R262 Difficulty in walking, not elsewhere classified: Secondary | ICD-10-CM | POA: Diagnosis not present

## 2016-01-17 NOTE — Therapy (Signed)
[place] MAIN [facility] SERVICES [address] [place], [place], [address] Phone: [phone]   Fax:  [phone]  Physical Therapy Treatment  Patient Details  Name: Claire Washington MRN: [id number] Date of Birth: December 26, 1924 Referring Provider: Dr. Ginette Pitman  Encounter Date: 01/17/2016      PT End of Session - 01/17/16 1620    Visit Number 24   Number of Visits 29   Date for PT Re-Evaluation 02-03-2016   Authorization Type G codes every 10th visit; G Code visit #5/10   PT Start Time 1430   PT Stop Time 1515   PT Time Calculation (min) 45 min   Equipment Utilized During Treatment Gait belt   Activity Tolerance Patient tolerated treatment well   Behavior During Therapy [facility] for tasks assessed/performed      Past Medical History:  Diagnosis Date  . Cancer ([place])    basel cell  . Chronic cystitis   . Hyperlipidemia   . Hypertension   . Leg pain   . Osteoarthritis   . Small vessel disease   . Syncope and collapse 2013  . Thyroid nodule   . Thyroid nodule   . Vertigo     Past Surgical History:  Procedure Laterality Date  . BILATERAL SALPINGOOPHORECTOMY    . COLONOSCOPY    . CYSTOSCOPY    . TOTAL ABDOMINAL HYSTERECTOMY      There were no vitals filed for this visit.      Subjective Assessment - 01/17/16 1433    Subjective Pt reports that she has gotten back to doing her LE HEP.  She believes she twisted her knee a little getting into the car but overall feeling ok.     Pertinent History Pt reports that she has been having problems with her balance for an extended period of time but progressively worsened recently. She also reports weakness with ascending stairs and finds herself needing to pull up on railings. She reports that she is very unsteady while descending stairs, ramps, and curbs. She uses a rollator for long distance community ambulation. For short distance community ambulation she uses a single point cane. At home she  furniture walks but does not use an assistive device. She has a history of dizziness and has participated in vestibular rehab in the past which was helpful. Pt unable to recall her vestibular diagnosis. Over the last 1.5 weeks she has started performing VOR x 1 horizontal exercise at home. She has not noticed any improvement in her dizziness since starting the exercise. She has one episode of syncope in the setting of GI issues/constipation in 2013. Pt denies falls in the last 12 months however states, "I am very careful." She reports that she has limited her activities due her balance/dizziness complaints.    Patient Stated Goals Improve balance and decrease risk for falls   Currently in Pain? No/denies     Treatment Nustep x 4 mins BUEs/BLes level 2 (unbilled)  Seated exercises on stability ball  -Sit to stands x 10 reps, CGA for safety, min VCs for upright posture and increased glut activation  -Dead bug, 2 sets x 10 reps, CGA for safety, min VCs for proper technique   -Hip flexion marches, 2 sets x 10 reps, min VCs for increased hip flexion ROM  -LAQs, 2 sets x 10 reps, 3 second isometric holds, min VCs for increased core activation  Leg Press, BLEs, #75, 3 sets x 10 reps, min VCs for increased eccentric control, min  A to position feet appropriately  Standing oblique twists with overhead press using ball, feet together, 1 set x 10 reps, CGA for safety, min VCs to increase anterior lean to decrease posterior tilt  Lateral rockerboard tilts, 2 sets x 15 reps, min A for stability, 2 HHA, min VCs for initial positioning and transferring weight                             PT Education - 01/17/16 1510    Education provided Yes   Education Details home exercise program    Person(s) Educated Patient   Methods Explanation;Demonstration;Verbal cues   Comprehension Verbalized understanding;Returned demonstration;Verbal cues required             PT Long Term Goals -  12/22/15 1429      PT LONG TERM GOAL #1   Title Pt will be independent with HEP in order to reduce fall risk, improve strength, and improve function at home   Time 6   Period Weeks   Status On-going     PT LONG TERM GOAL #2   Title Pt will improve BERG by at least 5 points in order to demonstrate significant improvement in balance and decrease fall risk   Baseline 10/12/15: 21/56, 11/16/15: 36/56, 12/07/15:34/56, 12/22/15 38/56   Time 6   Period Weeks   Status Partially Met     PT LONG TERM GOAL #3   Title Pt will improve 5TSTS by at least 2.3 seconds in order to demonstrates clinically significant improvement in LE strength and balance   Baseline 10/12/15: 18.0 seconds, 11/16/15: 12.4 seconds, 12/22/15:15.52 seconds    Time 6   Period Weeks   Status Achieved     PT LONG TERM GOAL #4   Title Pt will decrease DHI by at least 18 points in order to demonstrate clinically significant reduction in perception of handicap   Baseline 10/12/15: 52/100, 11/16/15: 48/100, 12/07/15:52/100, 12/22/15 40/56   Time 6   Period Weeks   Status On-going     PT LONG TERM GOAL #5   Title Pt will demonstrate increase in ABC by at least 13% in order to demonstrate significant improvement in balance confidence and decrease risk for falls   Baseline 10/12/15: 28.75%, 11/16/15: 30%, 12/07/15:30%, 12/22/15:32%   Time 6   Period Weeks   Status On-going               Plan - 01/17/16 1621    Clinical Impression Statement Pt reports no muscle soreness since previous visit. She reports she has started back doing her home exercise program.   Pt demonstrated greater control and full ROM with seated stability ball exercises compared to previous session.  Pt required more frequent seated rest break today due to reportedly not sleeping well last night.  During static balance tasks pt continues to demonstrate posterior lean which she tries to correct with anterior lean.  She would continue to benefit from skilled PT to increase LE  strength, balance and confidence for more functional mobility.     Rehab Potential Fair   Clinical Impairments Affecting Rehab Potential Positive: motivation, good safety awareness; Negative: chronicity   PT Frequency 2x / week   PT Duration 6 weeks   PT Treatment/Interventions ADLs/Self Care Home Management;Aquatic Therapy;Canalith Repostioning;Cryotherapy;Electrical Stimulation;Iontophoresis 53m/ml Dexamethasone;Moist Heat;Traction;Ultrasound;DME Instruction;Gait training;Stair training;Functional mobility training;Therapeutic activities;Therapeutic exercise;Balance training;Neuromuscular re-education;Patient/family education;Manual techniques;Energy conservation;Vestibular   PT Next Visit Plan strengthening, balance on compliant surfaces  PT Home Exercise Plan VOR x 1 horizontal in sitting 60 seconds x 2, vertrical x 2, 4 times/day (not currently progressed to standing due to poor technique and instability), sit to stand without UE support from elevated chair, feet apart standing with horizontal and vertical head turns   Consulted and Agree with Plan of Care Patient      Patient will benefit from skilled therapeutic intervention in order to improve the following deficits and impairments:  Decreased balance, Decreased mobility, Decreased strength, Difficulty walking, Dizziness  Visit Diagnosis: Difficulty in walking, not elsewhere classified  Dizziness and giddiness  Muscle weakness (generalized)     Problem List Patient Active Problem List   Diagnosis Date Noted  . Dizziness 11/03/2015  . Hypertension 12/04/2011  . Hyperlipidemia 12/04/2011  . Syncope 12/04/2011  . Diabetes mellitus ([place]) 12/04/2011  . Smoking hx 12/04/2011   Stacy Gardner, SPT  This entire session was performed under direct supervision and direction of a licensed therapist/therapist assistant . I have personally read, edited and approve of the note as written.  Trotter,Margaret PT, DPT 01/18/2016, 9:22  AM  [place] MAIN [facility] SERVICES [address] [place], [place], [address] Phone: [phone]   Fax:  [phone]  Name: Blu Mcglaun MRN: [id number] Date of Birth: Apr 03, 1925

## 2016-01-19 ENCOUNTER — Encounter: Payer: BLUE CROSS/BLUE SHIELD | Admitting: Physical Therapy

## 2016-01-20 ENCOUNTER — Ambulatory Visit: Payer: Medicare Other | Admitting: Physical Therapy

## 2016-01-20 ENCOUNTER — Encounter: Payer: Self-pay | Admitting: Physical Therapy

## 2016-01-20 DIAGNOSIS — M6281 Muscle weakness (generalized): Secondary | ICD-10-CM

## 2016-01-20 DIAGNOSIS — R262 Difficulty in walking, not elsewhere classified: Secondary | ICD-10-CM | POA: Diagnosis not present

## 2016-01-20 DIAGNOSIS — R42 Dizziness and giddiness: Secondary | ICD-10-CM

## 2016-01-20 NOTE — Therapy (Signed)
[place] MAIN [facility] SERVICES [address] [place], [place], [address] Phone: [phone]   Fax:  [phone]  Physical Therapy Treatment/Progress Note  Patient Details  Name: Claire Washington MRN: [id number] Date of Birth: Apr 30, 1924 Referring Provider: Dr. Ginette Pitman  Encounter Date: 01/20/2016      PT End of Session - 01/20/16 1451    Visit Number 25   Number of Visits 37   Date for PT Re-Evaluation Mar 09, 2016   Authorization Type G codes every 10th visit; G Code visit #6/10   PT Start Time 1345   PT Stop Time 1430   PT Time Calculation (min) 45 min   Equipment Utilized During Treatment Gait belt   Activity Tolerance Patient tolerated treatment well   Behavior During Therapy [facility] for tasks assessed/performed      Past Medical History:  Diagnosis Date  . Cancer ([place])    basel cell  . Chronic cystitis   . Hyperlipidemia   . Hypertension   . Leg pain   . Osteoarthritis   . Small vessel disease   . Syncope and collapse 2013  . Thyroid nodule   . Thyroid nodule   . Vertigo     Past Surgical History:  Procedure Laterality Date  . BILATERAL SALPINGOOPHORECTOMY    . COLONOSCOPY    . CYSTOSCOPY    . TOTAL ABDOMINAL HYSTERECTOMY      There were no vitals filed for this visit.      Subjective Assessment - 01/20/16 1338    Subjective Pt reports that she isnt having any knee pain today.  She reports having some dizziness this morning when she believes she was working too hard.    Pertinent History Pt reports that she has been having problems with her balance for an extended period of time but progressively worsened recently. She also reports weakness with ascending stairs and finds herself needing to pull up on railings. She reports that she is very unsteady while descending stairs, ramps, and curbs. She uses a rollator for long distance community ambulation. For short distance community ambulation she uses a single point cane. At  home she furniture walks but does not use an assistive device. She has a history of dizziness and has participated in vestibular rehab in the past which was helpful. Pt unable to recall her vestibular diagnosis. Over the last 1.5 weeks she has started performing VOR x 1 horizontal exercise at home. She has not noticed any improvement in her dizziness since starting the exercise. She has one episode of syncope in the setting of GI issues/constipation in 2013. Pt denies falls in the last 12 months however states, "I am very careful." She reports that she has limited her activities due her balance/dizziness complaints.    Patient Stated Goals Improve balance and decrease risk for falls   Currently in Pain? No/denies            [facility] PT Assessment - 01/20/16 0001      Observation/Other Assessments   Activities of Balance Confidence Scale (ABC Scale)  31%, lower score indicating decreased balance ability    Dizziness Handicap Inventory (DHI)  46/100, smaller score indicates less percieved disability,      Standardized Balance Assessment   Standardized Balance Assessment Berg Balance Test   Five times sit to stand comments  16 seconds    10 Meter Walk 9.50 seconds with rollator, 1.32ms indicating full community ambulator     [employer]  to Stand Able to stand without using hands and stabilize independently   Standing Unsupported Able to stand safely 2 minutes   Sitting with Back Unsupported but Feet Supported on Floor or Stool Able to sit safely and securely 2 minutes   Stand to Sit Sits safely with minimal use of hands   Transfers Able to transfer safely, minor use of hands   Standing Unsupported with Eyes Closed Able to stand 3 seconds   Standing Ubsupported with Feet Together Needs help to attain position and unable to hold for 15 seconds   From Standing, Reach Forward with Outstretched Arm Can reach forward >5 cm safely (2")   From Standing Position, Pick up Object from Floor  Able to pick up shoe, needs supervision   From Standing Position, Turn to Look Behind Over each Shoulder Looks behind from both sides and weight shifts well   Turn 360 Degrees Able to turn 360 degrees safely but slowly   Standing Unsupported, Alternately Place Feet on Step/Stool Able to stand independently and complete 8 steps >20 seconds   Standing Unsupported, One Foot in Front Needs help to step but can hold 15 seconds   Standing on One Leg Tries to lift leg/unable to hold 3 seconds but remains standing independently   Total Score 38   Berg comment: 38/56  significant falls risk     Timed Up and Go Test   TUG Normal TUG   Normal TUG (seconds) 11.84     Treatment Nustep x 4 mins level 2 BUEs/BLEs (unbilled) Administed ABC to track progress, 32% on last progress check compared to 31% on this check indicating slighty decreased perception of balance  Administered DHI to track progress, pt reported 46/100 compared to 48/100 at last progress check indicating less percieved handicap  Administered BERG to track progress, pt scored 38/56 compared to 38/56 on last session  10 m walk test performed with rollator and supervision in 9.50seconds compared to last progress check of 9.80seconds with a gait speed of 1.81ms indicating she is a full community ambulator 5 time sit to stand test performed in 16 seconds compared to 15.51 seconds last time indicating no change, >15 seconds indicates increased falls risk  Scapular retractions with yellow theraband while standing on purple airex pad, 2 sets x 10 reps, min VCs for upright posture and increased scapular retraction, CGA for safety  Sidestepping with yellow theraband, no HHA, min VCs for increased step length and increased postural control Steps ups starting on purple airex pad and stepping onto 6 inch step, 1 HHA, 2 sets x 10 reps alternating LEs, attempted without UEs but posterior LOB corrected by therapist  Tandem stance with front foot on 6 inch  step and back foot on purple airex pad x 10 with alternating arm raise, CGA for stability, several LOB corrected by therapist, min VCs to increase core activation                         PT Education - 01/20/16 1356    Education provided Yes   Education Details Forever Fit/Silver sneakers program    Person(s) Educated Patient   Methods Explanation;Demonstration;Verbal cues   Comprehension Verbalized understanding;Returned demonstration;Verbal cues required             PT Long Term Goals - 01/20/16 1736      PT LONG TERM GOAL #1   Title Pt will be independent with HEP in order to reduce fall risk,  improve strength, and improve function at home   Time 4   Period Weeks   Status On-going     PT LONG TERM GOAL #2   Title Pt will improve BERG by at least 5 points in order to demonstrate significant improvement in balance and decrease fall risk   Baseline 10/12/15: 21/56, 11/16/15: 36/56, 12/07/15:34/56, 12/22/15 38/56   Time 4   Period Weeks   Status Partially Met     PT LONG TERM GOAL #3   Title Pt will improve 5TSTS by at least 2.3 seconds in order to demonstrates clinically significant improvement in LE strength and balance   Baseline 10/12/15: 18.0 seconds, 11/16/15: 12.4 seconds, 12/22/15:15.52 seconds    Time 4   Period Weeks   Status Achieved     PT LONG TERM GOAL #4   Title Pt will decrease DHI by at least 18 points in order to demonstrate clinically significant reduction in perception of handicap   Baseline 10/12/15: 52/100, 11/16/15: 48/100, 12/07/15:52/100, 12/22/15 40/56   Time 4   Period Weeks   Status On-going     PT LONG TERM GOAL #5   Title Pt will demonstrate increase in ABC by at least 13% in order to demonstrate significant improvement in balance confidence and decrease risk for falls   Baseline 10/12/15: 28.75%, 11/16/15: 30%, 12/07/15:30%, 12/22/15:32%   Time 4   Period Weeks   Status On-going               Plan - 01/20/16 1452    Clinical  Impression Statement Pt reports no pain or dizziness at todays visit.  Pt demonstrates progress in overall functional mobility.  Pt self reported DHI of 46/100 indicating progress since last assessment of 48/100.  Pts gait speed improved to 1.05 m/s indicating she is a full community ambulator.  Pt performed similarly to last progress check in BERG balance test 38/56, 5 time sit to stand in 16 seconds and self reported ABC score.  Continued to work on static balance in tandem stance on noncompliant surfaces for further challenge.  Pt is able to complete but requires 1 HHA for safety and min VCS to increase core activation.  Discussed that we may be plateuing in physical therapy and working towards transitioning to forever fit/silver sneakers in another 4 visits or so.  She would benefit from further skilled PT to continue working on LE strength and balance before transitioning to exercise program.     Rehab Potential Fair   Clinical Impairments Affecting Rehab Potential Positive: motivation, good safety awareness; Negative: chronicity   PT Frequency 2x / week   PT Duration 4 weeks   PT Treatment/Interventions ADLs/Self Care Home Management;Aquatic Therapy;Canalith Repostioning;Cryotherapy;Electrical Stimulation;Iontophoresis '4mg'$ /ml Dexamethasone;Moist Heat;Traction;Ultrasound;DME Instruction;Gait training;Stair training;Functional mobility training;Therapeutic activities;Therapeutic exercise;Balance training;Neuromuscular re-education;Patient/family education;Manual techniques;Energy conservation;Vestibular   PT Next Visit Plan strengthening, balance on compliant surfaces    PT Home Exercise Plan VOR x 1 horizontal in sitting 60 seconds x 2, vertrical x 2, 4 times/day (not currently progressed to standing due to poor technique and instability), sit to stand without UE support from elevated chair, feet apart standing with horizontal and vertical head turns   Consulted and Agree with Plan of Care Patient       Patient will benefit from skilled therapeutic intervention in order to improve the following deficits and impairments:  Decreased balance, Decreased mobility, Decreased strength, Difficulty walking, Dizziness  Visit Diagnosis: Difficulty in walking, not elsewhere classified - Plan: PT plan of care cert/re-cert  Muscle weakness (generalized) - Plan: PT plan of care cert/re-cert  Dizziness and giddiness - Plan: PT plan of care cert/re-cert     Problem List Patient Active Problem List   Diagnosis Date Noted  . Dizziness 11/03/2015  . Hypertension 12/04/2011  . Hyperlipidemia 12/04/2011  . Syncope 12/04/2011  . Diabetes mellitus ([place]) 12/04/2011  . Smoking hx 12/04/2011   Stacy Gardner, SPT  This entire session was performed under direct supervision and direction of a licensed therapist/therapist assistant . I have personally read, edited and approve of the note as written.  Trotter,Margaret PT, DPT 01/20/2016, 5:38 PM  [place] MAIN [facility] SERVICES [address] [place], [place], [address] Phone: [phone]   Fax:  [phone]  Name: Claire Washington MRN: [id number] Date of Birth: 1924-07-05

## 2016-01-24 ENCOUNTER — Ambulatory Visit: Payer: Medicare Other | Admitting: Physical Therapy

## 2016-01-24 ENCOUNTER — Encounter: Payer: Self-pay | Admitting: Physical Therapy

## 2016-01-24 VITALS — BP 154/51 | HR 61

## 2016-01-24 DIAGNOSIS — R262 Difficulty in walking, not elsewhere classified: Secondary | ICD-10-CM

## 2016-01-24 DIAGNOSIS — M6281 Muscle weakness (generalized): Secondary | ICD-10-CM

## 2016-01-24 DIAGNOSIS — R42 Dizziness and giddiness: Secondary | ICD-10-CM

## 2016-01-24 NOTE — Patient Instructions (Addendum)
    Lie on your back and alternate bringing one knee to your chest.  2 sets x 10 reps   Copyright  VHI. All rights reserved.  Lumbar Rotation (Non-Weight Bearing)    Gently rock your knees side to side, 2 sets x 10 reps.   http://orth.exer.us/161   Copyright  VHI. All rights reserved.  Rotation: Lumbar Cranial - Side-Lying    Lie on your side with your knees together, keep your legs still and reach back over across your body, 2 sets x 10 reps each side   Copyright  VHI. All rights reserved.

## 2016-01-24 NOTE — Therapy (Signed)
[place] MAIN [facility] SERVICES [address] [place], [place], [address] Phone: [phone]   Fax:  [phone]  Physical Therapy Treatment  Patient Details  Name: Claire Washington MRN: [id number] Date of Birth: 07/06/1924 Referring Provider: Dr. Ginette Pitman  Encounter Date: 01/24/2016      PT End of Session - 01/24/16 1348    Visit Number 26   Number of Visits 37   Date for PT Re-Evaluation 29-Feb-2016   Authorization Type G codes every 10th visit; G Code visit #7/10   PT Start Time 1300   PT Stop Time 1345   PT Time Calculation (min) 45 min   Equipment Utilized During Treatment Gait belt   Activity Tolerance Patient tolerated treatment well   Behavior During Therapy [facility] for tasks assessed/performed      Past Medical History:  Diagnosis Date  . Cancer ([place])    basel cell  . Chronic cystitis   . Hyperlipidemia   . Hypertension   . Leg pain   . Osteoarthritis   . Small vessel disease   . Syncope and collapse 2013  . Thyroid nodule   . Thyroid nodule   . Vertigo     Past Surgical History:  Procedure Laterality Date  . BILATERAL SALPINGOOPHORECTOMY    . COLONOSCOPY    . CYSTOSCOPY    . TOTAL ABDOMINAL HYSTERECTOMY      Vitals:   01/24/16 1304  BP: (!) 154/51  Pulse: 61  SpO2: 99%        Subjective Assessment - 01/24/16 1303    Subjective Pt reports that she is going to see her doctor tomorrow regarding her blood pressure medication.  She believes that is what is making her dizzy.     Pertinent History Pt reports that she has been having problems with her balance for an extended period of time but progressively worsened recently. She also reports weakness with ascending stairs and finds herself needing to pull up on railings. She reports that she is very unsteady while descending stairs, ramps, and curbs. She uses a rollator for long distance community ambulation. For short distance community ambulation she uses a single point  cane. At home she furniture walks but does not use an assistive device. She has a history of dizziness and has participated in vestibular rehab in the past which was helpful. Pt unable to recall her vestibular diagnosis. Over the last 1.5 weeks she has started performing VOR x 1 horizontal exercise at home. She has not noticed any improvement in her dizziness since starting the exercise. She has one episode of syncope in the setting of GI issues/constipation in 2013. Pt denies falls in the last 12 months however states, "I am very careful." She reports that she has limited her activities due her balance/dizziness complaints.    Patient Stated Goals Improve balance and decrease risk for falls   Currently in Pain? No/denies       Treatment Lateral ball roll outs seated with stability ball, min tactile cues for proper positioning and min VCs for upright posture  Push/pull seated core exercise with #2, 2 sets x 10 reps, min resistance provided by therapist, min VCs to increase upright posture and maintain core stability  Lower trunk rotations x 10 reps to decrease morning LBP, min VCs to not rotate through pain but stop before for good stretch  Upper trunk rotations x 10 each side to decreaes morning LBP, min VCs on positioning and increased trunk rotation  Single knee to chest alternating LEs, 1 set x 10 reps to initate into morning stretching routine.  Wall Squats with stability ball support, 2 sets x 10 reps, min VCS for set up and min VCs to increase knee flexion  Leg Press, #90, 3 sets x 10 reps, BLEs, min VCs to increase eccentric control  Scapular retractions standing on purple airex pad with red theraband resistance, 2 sets x 10 reps, CGA to prevent posterior lean and min VCs for upright posture                            PT Education - 01/24/16 1348    Education provided Yes   Education Details routine stretches for low back    Person(s) Educated Patient   Methods  Explanation;Demonstration;Verbal cues   Comprehension Verbalized understanding;Returned demonstration;Verbal cues required             PT Long Term Goals - 01/20/16 1736      PT LONG TERM GOAL #1   Title Pt will be independent with HEP in order to reduce fall risk, improve strength, and improve function at home   Time 4   Period Weeks   Status On-going     PT LONG TERM GOAL #2   Title Pt will improve BERG by at least 5 points in order to demonstrate significant improvement in balance and decrease fall risk   Baseline 10/12/15: 21/56, 11/16/15: 36/56, 12/07/15:34/56, 12/22/15 38/56   Time 4   Period Weeks   Status Partially Met     PT LONG TERM GOAL #3   Title Pt will improve 5TSTS by at least 2.3 seconds in order to demonstrates clinically significant improvement in LE strength and balance   Baseline 10/12/15: 18.0 seconds, 11/16/15: 12.4 seconds, 12/22/15:15.52 seconds    Time 4   Period Weeks   Status Achieved     PT LONG TERM GOAL #4   Title Pt will decrease DHI by at least 18 points in order to demonstrate clinically significant reduction in perception of handicap   Baseline 10/12/15: 52/100, 11/16/15: 48/100, 12/07/15:52/100, 12/22/15 40/56   Time 4   Period Weeks   Status On-going     PT LONG TERM GOAL #5   Title Pt will demonstrate increase in ABC by at least 13% in order to demonstrate significant improvement in balance confidence and decrease risk for falls   Baseline 10/12/15: 28.75%, 11/16/15: 30%, 12/07/15:30%, 12/22/15:32%   Time 4   Period Weeks   Status On-going               Plan - 01/24/16 1349    Clinical Impression Statement Pt reports she has noticed increased dizziness after taking her blood pressure medication in the morning.  She is going to discuss with her MD tomorrow at her appointment.  Instructed in various supine stretches for low back that she can do in the morning after complaints of low back pain.  Continued working on dynamic balance with monster  walks over beam with 2 finger assistance and CGA from therapist.  Pt demonstrated difficulty with backwards stepping compared to forwards stepping.  Initated further seated core exercises including lateral ball roll outs and push/pull in seated position.  Pt demonstrated adequate technique and min VCs to increase upright posture and core activation.  She would continue to benefit from further skilled PT to increase her dynamic balance and LE strength for greater functional mobility.  Rehab Potential Fair   Clinical Impairments Affecting Rehab Potential Positive: motivation, good safety awareness; Negative: chronicity   PT Frequency 2x / week   PT Duration 4 weeks   PT Treatment/Interventions ADLs/Self Care Home Management;Aquatic Therapy;Canalith Repostioning;Cryotherapy;Electrical Stimulation;Iontophoresis 96m/ml Dexamethasone;Moist Heat;Traction;Ultrasound;DME Instruction;Gait training;Stair training;Functional mobility training;Therapeutic activities;Therapeutic exercise;Balance training;Neuromuscular re-education;Patient/family education;Manual techniques;Energy conservation;Vestibular   PT Next Visit Plan strengthening, balance on compliant surfaces    PT Home Exercise Plan VOR x 1 horizontal in sitting 60 seconds x 2, vertrical x 2, 4 times/day (not currently progressed to standing due to poor technique and instability), sit to stand without UE support from elevated chair, feet apart standing with horizontal and vertical head turns   Consulted and Agree with Plan of Care Patient      Patient will benefit from skilled therapeutic intervention in order to improve the following deficits and impairments:  Decreased balance, Decreased mobility, Decreased strength, Difficulty walking, Dizziness  Visit Diagnosis: Difficulty in walking, not elsewhere classified  Dizziness and giddiness  Muscle weakness (generalized)     Problem List Patient Active Problem List   Diagnosis Date Noted  .  Dizziness 11/03/2015  . Hypertension 12/04/2011  . Hyperlipidemia 12/04/2011  . Syncope 12/04/2011  . Diabetes mellitus ([place] 12/04/2011  . Smoking hx 12/04/2011   TStacy Gardner SPT  This entire session was performed under direct supervision and direction of a licensed therapist/therapist assistant . I have personally read, edited and approve of the note as written.  Trotter,Margaret PT, DPT 01/24/2016, 5:43 PM  [place] [facility] [address] [address]Phone: [phone]  Fax:  [phone] Name: DMyley BahnerMRN: [id number] of Birth: 704-Aug-1926

## 2016-01-26 ENCOUNTER — Ambulatory Visit: Payer: BLUE CROSS/BLUE SHIELD | Admitting: Physical Therapy

## 2016-01-27 ENCOUNTER — Ambulatory Visit: Payer: Medicare Other | Admitting: Physical Therapy

## 2016-01-31 ENCOUNTER — Encounter: Payer: Self-pay | Admitting: Physical Therapy

## 2016-01-31 ENCOUNTER — Ambulatory Visit: Payer: Medicare Other | Admitting: Physical Therapy

## 2016-01-31 VITALS — BP 145/50 | HR 60

## 2016-01-31 DIAGNOSIS — R262 Difficulty in walking, not elsewhere classified: Secondary | ICD-10-CM

## 2016-01-31 DIAGNOSIS — R42 Dizziness and giddiness: Secondary | ICD-10-CM

## 2016-01-31 DIAGNOSIS — M6281 Muscle weakness (generalized): Secondary | ICD-10-CM

## 2016-01-31 NOTE — Therapy (Signed)
[place] MAIN [facility] SERVICES [address] [place], [place], [address] Phone: [phone]   Fax:  [phone]  Physical Therapy Treatment  Patient Details  Name: Claire Washington MRN: [id number] Date of Birth: 1924-08-29 Referring Provider: Dr. Ginette Pitman  Encounter Date: 01/31/2016      PT End of Session - 01/31/16 1309    Visit Number 27   Number of Visits 37   Date for PT Re-Evaluation 03-15-16   Authorization Type G codes every 10th visit; G Code visit #8/10   PT Start Time 1301   PT Stop Time 1345   PT Time Calculation (min) 44 min   Equipment Utilized During Treatment Gait belt   Activity Tolerance Patient tolerated treatment well   Behavior During Therapy [facility] for tasks assessed/performed      Past Medical History:  Diagnosis Date  . Cancer ([place])    basel cell  . Chronic cystitis   . Hyperlipidemia   . Hypertension   . Leg pain   . Osteoarthritis   . Small vessel disease   . Syncope and collapse 2013  . Thyroid nodule   . Thyroid nodule   . Vertigo     Past Surgical History:  Procedure Laterality Date  . BILATERAL SALPINGOOPHORECTOMY    . COLONOSCOPY    . CYSTOSCOPY    . TOTAL ABDOMINAL HYSTERECTOMY      Vitals:   01/31/16 1304  BP: (!) 145/50  Pulse: 60  SpO2: 98%        Subjective Assessment - 01/31/16 1304    Subjective Pt reports she has 4/10 dizziness that was worse over the weekend.  She reports her knees are feeling good and she has been compliant with her HEP.     Currently in Pain? Yes   Pain Score 4    Pain Location --  dizziness       Treatment Nustep x 4 mins BUEs/BLEs level 1 (unbilled)  Lateral stability ball roll out, 1 set x 10 reps each side, min resistance provided from PT, min VCs to maintain upright posture  Seated russian twists, yellow weighted ball, 2 sets x 10 reps, min VCs for increased trunk rotation,  Sit to stands, standing on compliant surface with red theraband  resistance, 1 sets x 10 reps, 2 sets x 5 reps, with seated rest breaks in between, CGA for stability and to prevent posterior lean, min VCs to prevent knee valgus  Scapular retractions standing on purple airex pad, 2 sets x 10 reps with red theraband resistance, CGA for stability and correction of LOB posteriorly, min tactile cues for increased scapular retraction  Step taps towards stepping stones in sequence of 3, one finger assist, 2 sets x 15 bouts standing on purple airex pad, CGA for stability, several LOB corrected by therapist  Lateral steps on bosu ball, 2 sets x 10 reps, 2 HHA for stability, min VCs for upright posture and larger steps, CGA for stability                           PT Education - 01/31/16 1308    Education provided Yes   Education Details squat form    Person(s) Educated Patient   Methods Explanation;Demonstration;Verbal cues   Comprehension Verbalized understanding;Returned demonstration;Verbal cues required             PT Long Term Goals - 01/20/16 1736      PT LONG TERM  GOAL #1   Title Pt will be independent with HEP in order to reduce fall risk, improve strength, and improve function at home   Time 4   Period Weeks   Status On-going     PT LONG TERM GOAL #2   Title Pt will improve BERG by at least 5 points in order to demonstrate significant improvement in balance and decrease fall risk   Baseline 10/12/15: 21/56, 11/16/15: 36/56, 12/07/15:34/56, 12/22/15 38/56   Time 4   Period Weeks   Status Partially Met     PT LONG TERM GOAL #3   Title Pt will improve 5TSTS by at least 2.3 seconds in order to demonstrates clinically significant improvement in LE strength and balance   Baseline 10/12/15: 18.0 seconds, 11/16/15: 12.4 seconds, 12/22/15:15.52 seconds    Time 4   Period Weeks   Status Achieved     PT LONG TERM GOAL #4   Title Pt will decrease DHI by at least 18 points in order to demonstrate clinically significant reduction in  perception of handicap   Baseline 10/12/15: 52/100, 11/16/15: 48/100, 12/07/15:52/100, 12/22/15 40/56   Time 4   Period Weeks   Status On-going     PT LONG TERM GOAL #5   Title Pt will demonstrate increase in ABC by at least 13% in order to demonstrate significant improvement in balance confidence and decrease risk for falls   Baseline 10/12/15: 28.75%, 11/16/15: 30%, 12/07/15:30%, 12/22/15:32%   Time 4   Period Weeks   Status On-going               Plan - 01/31/16 1353    Clinical Impression Statement Pt continues to report dizziness especially after taking her medication.  Pt demonstrated progress with single leg balance stability while performing step taps to stepping stones by having less LOB posteriorly and decreased HHA.  Advanced sit to stands with airex pad and red theraband resistance for greater challenge to her balance. Pt required CGA for stability and min VCS for upright posture.  Initated lateral stepping over bosu ball to promote greater ankle and knee stability, pt required standing rest breaks after 10 reps due to fatigue.  She would continue to benefit from further skilled PT to increase her LE strength and balance for more functional mobility.     Rehab Potential Fair   Clinical Impairments Affecting Rehab Potential Positive: motivation, good safety awareness; Negative: chronicity   PT Frequency 2x / week   PT Duration 4 weeks   PT Treatment/Interventions ADLs/Self Care Home Management;Aquatic Therapy;Canalith Repostioning;Cryotherapy;Electrical Stimulation;Iontophoresis 15m/ml Dexamethasone;Moist Heat;Traction;Ultrasound;DME Instruction;Gait training;Stair training;Functional mobility training;Therapeutic activities;Therapeutic exercise;Balance training;Neuromuscular re-education;Patient/family education;Manual techniques;Energy conservation;Vestibular   PT Next Visit Plan last visit, goals and HEP    PT Home Exercise Plan VOR x 1 horizontal in sitting 60 seconds x 2, vertrical  x 2, 4 times/day (not currently progressed to standing due to poor technique and instability), sit to stand without UE support from elevated chair, feet apart standing with horizontal and vertical head turns   Consulted and Agree with Plan of Care Patient      Patient will benefit from skilled therapeutic intervention in order to improve the following deficits and impairments:  Decreased balance, Decreased mobility, Decreased strength, Difficulty walking, Dizziness  Visit Diagnosis: Difficulty in walking, not elsewhere classified  Dizziness and giddiness  Muscle weakness (generalized)     Problem List Patient Active Problem List   Diagnosis Date Noted  . Dizziness 11/03/2015  . Hypertension 12/04/2011  .  Hyperlipidemia 12/04/2011  . Syncope 12/04/2011  . Diabetes mellitus ([place]) 12/04/2011  . Smoking hx 12/04/2011   Stacy Gardner, SPT   This entire session was performed under direct supervision and direction of a licensed therapist/therapist assistant . I have personally read, edited and approve of the note as written.  Collie Siad PT, DPT 01/31/2016, 2:30 PM  [place] MAIN [facility] SERVICES [address] [place], [place], [address] Phone: [phone]   Fax:  [phone]  Name: Claire Washington MRN: [id number] Date of Birth: 1924/06/23

## 2016-02-02 ENCOUNTER — Ambulatory Visit: Payer: BLUE CROSS/BLUE SHIELD | Admitting: Physical Therapy

## 2016-02-03 ENCOUNTER — Encounter: Payer: Self-pay | Admitting: Physical Therapy

## 2016-02-03 ENCOUNTER — Ambulatory Visit: Payer: Medicare Other | Admitting: Physical Therapy

## 2016-02-03 DIAGNOSIS — R262 Difficulty in walking, not elsewhere classified: Secondary | ICD-10-CM

## 2016-02-03 DIAGNOSIS — M6281 Muscle weakness (generalized): Secondary | ICD-10-CM

## 2016-02-03 DIAGNOSIS — R42 Dizziness and giddiness: Secondary | ICD-10-CM

## 2016-02-03 NOTE — Therapy (Signed)
[place] MAIN [facility] SERVICES [address] [place], [place], [address] Phone: [phone]   Fax:  [phone]  Physical Therapy Treatment  Patient Details  Name: Claire Washington MRN: [id number] Date of Birth: 1925-03-22 Referring Provider: Dr. Ginette Pitman  Encounter Date: 02/03/2016      PT End of Session - 02/03/16 1442    Visit Number 28   Number of Visits 37   Date for PT Re-Evaluation 25-Feb-2016   Authorization Type G codes every 10th visit; G Code visit #9/10   PT Start Time 1400   PT Stop Time 1443   PT Time Calculation (min) 43 min   Equipment Utilized During Treatment Gait belt   Activity Tolerance Patient tolerated treatment well   Behavior During Therapy [facility] for tasks assessed/performed      Past Medical History:  Diagnosis Date  . Cancer ([place])    basel cell  . Chronic cystitis   . Hyperlipidemia   . Hypertension   . Leg pain   . Osteoarthritis   . Small vessel disease   . Syncope and collapse 2013  . Thyroid nodule   . Thyroid nodule   . Vertigo     Past Surgical History:  Procedure Laterality Date  . BILATERAL SALPINGOOPHORECTOMY    . COLONOSCOPY    . CYSTOSCOPY    . TOTAL ABDOMINAL HYSTERECTOMY      There were no vitals filed for this visit.      Subjective Assessment - 02/03/16 1403    Subjective Pt reports 4/10 dizziness which she attributes today to being tired from getting up in the middle of the night.  No knee pain, and pt reports she has been compliant with her HEP.     Currently in Pain? No/denies            [facility] PT Assessment - 02/03/16 0001      Standardized Balance Assessment   Standardized Balance Assessment Berg Balance Test   Five times sit to stand comments  13.44 seconds    10 Meter Walk 9.05 seconds with rolling walker      Timed Up and Go Test   TUG Normal TUG   Normal TUG (seconds) 10.04       Treatment Nustep x 4 mins BUEs/BLEs, level 1 (unbilled)  Reassessed 10 m  walk test for progress, pt improved 10 m walk test to 1.12ms indicating she is a full community ambulator with RW for assistance Reassessed 5 time sit to stand for progress, pt performed 13.44 seconds compared to 16 seconds at last assessment indicating she is at decreased falls risk  Reassessed TUG test for progress, pt performed in 10.04 seconds with RW compared to 11.84 seconds at last assessment indicating she is not at a a falls risk  Reinforced HEP and went over how to separate them out so that they are not overwhelming to do all in one day.   Curb training (12 mins)  -Performed 6 inch curb step x8 attempts with SPC, CGA from therapist for stability, min VCs for forward gaze, tighten core and anterior lean to prevent posterior LOB, with VCs pt demonstrated greater difficulty descending compared to ascending but was able to perform with less posterior LOB                      PT Education - 02/03/16 1431    Education provided Yes   Education Details reeducated on breaking up HEP into seperate  days    Person(s) Educated Patient   Methods Demonstration;Explanation;Verbal cues   Comprehension Returned demonstration;Verbalized understanding;Verbal cues required             PT Long Term Goals - 02/03/16 1432      PT LONG TERM GOAL #1   Title Pt will be independent with HEP in order to reduce fall risk, improve strength, and improve function at home   Time 4   Period Weeks   Status Achieved     PT LONG TERM GOAL #2   Title Pt will improve BERG by at least 5 points in order to demonstrate significant improvement in balance and decrease fall risk   Baseline 10/12/15: 21/56, 11/16/15: 36/56, 12/07/15:34/56, 12/22/15 38/56   Time 4   Period Weeks   Status Partially Met     PT LONG TERM GOAL #3   Title Pt will improve 5TSTS by at least 2.3 seconds in order to demonstrates clinically significant improvement in LE strength and balance   Baseline 10/12/15: 18.0 seconds,  11/16/15: 12.4 seconds, 12/22/15:15.52 seconds    Time 4   Period Weeks   Status Achieved     PT LONG TERM GOAL #4   Title Pt will decrease DHI by at least 18 points in order to demonstrate clinically significant reduction in perception of handicap   Baseline 10/12/15: 52/100, 11/16/15: 48/100, 12/07/15:52/100, 12/22/15 40/56   Time 4   Period Weeks   Status Partially Met     PT LONG TERM GOAL #5   Title Pt will demonstrate increase in ABC by at least 13% in order to demonstrate significant improvement in balance confidence and decrease risk for falls   Baseline 10/12/15: 28.75%, 11/16/15: 30%, 12/07/15:30%, 12/22/15:32%   Time 4   Period Weeks   Status Partially Met               Plan - 02/03/16 1443    Clinical Impression Statement Pt made signficant progress over the course of her rehab at todays discharge.  Pt continues to have some dizziness that her MD is adjusting medication for. At this point she is a full Education officer, community with a gait speed of 1.52ms with her RW.  She is able to perform 5 time sit to stand in 13.9 seconds without use of UEs which indicates she is a decreased falls risk.  Her TUG score improved to 10.04 seconds meaning she is no longer a falls risk with her RW.  Today she was able to perform curb step with SPC by end of session with CGA for safety without posterior LOB with min VCs to gaze forward and maintain anterior lean.  We discussed in detail a plan for balance and strengthening HEP that she has already been compliant with.  She is prepared to be discharged from PT and work on her own at home.     Rehab Potential Fair   Clinical Impairments Affecting Rehab Potential Positive: motivation, good safety awareness; Negative: chronicity   PT Frequency 2x / week   PT Duration 4 weeks   PT Treatment/Interventions ADLs/Self Care Home Management;Aquatic Therapy;Canalith Repostioning;Cryotherapy;Electrical Stimulation;Iontophoresis 449mml Dexamethasone;Moist  Heat;Traction;Ultrasound;DME Instruction;Gait training;Stair training;Functional mobility training;Therapeutic activities;Therapeutic exercise;Balance training;Neuromuscular re-education;Patient/family education;Manual techniques;Energy conservation;Vestibular   PT Next Visit Plan last visit, goals and HEP    PT Home Exercise Plan VOR x 1 horizontal in sitting 60 seconds x 2, vertrical x 2, 4 times/day (not currently progressed to standing due to poor technique and instability), sit to stand without  UE support from elevated chair, feet apart standing with horizontal and vertical head turns   Consulted and Agree with Plan of Care Patient      Patient will benefit from skilled therapeutic intervention in order to improve the following deficits and impairments:  Decreased balance, Decreased mobility, Decreased strength, Difficulty walking, Dizziness  Visit Diagnosis: Difficulty in walking, not elsewhere classified  Dizziness and giddiness  Muscle weakness (generalized)     Problem List Patient Active Problem List   Diagnosis Date Noted  . Dizziness 11/03/2015  . Hypertension 12/04/2011  . Hyperlipidemia 12/04/2011  . Syncope 12/04/2011  . Diabetes mellitus ([place]) 12/04/2011  . Smoking hx 12/04/2011   Stacy Gardner, SPT   This entire session was performed under direct supervision and direction of a licensed therapist/therapist assistant . I have personally read, edited and approve of the note as written.  Collie Siad PT, DPT 02/03/2016, 5:20 PM  [place] MAIN [facility] SERVICES [address] [place], [place], [address] Phone: [phone]   Fax:  [phone]  Name: Claire Washington MRN: [id number] Date of Birth: [birth year]

## 2016-05-01 ENCOUNTER — Encounter: Payer: Self-pay | Admitting: Cardiovascular Disease

## 2016-05-01 ENCOUNTER — Ambulatory Visit (INDEPENDENT_AMBULATORY_CARE_PROVIDER_SITE_OTHER): Payer: Medicare Other | Admitting: Cardiovascular Disease

## 2016-05-01 VITALS — BP 130/54 | HR 65 | Ht 61.0 in | Wt 124.5 lb

## 2016-05-01 DIAGNOSIS — Z87891 Personal history of nicotine dependence: Secondary | ICD-10-CM | POA: Diagnosis not present

## 2016-05-01 DIAGNOSIS — I158 Other secondary hypertension: Secondary | ICD-10-CM

## 2016-05-01 DIAGNOSIS — E782 Mixed hyperlipidemia: Secondary | ICD-10-CM | POA: Diagnosis not present

## 2016-05-01 DIAGNOSIS — I6521 Occlusion and stenosis of right carotid artery: Secondary | ICD-10-CM | POA: Diagnosis not present

## 2016-05-01 DIAGNOSIS — F05 Delirium due to known physiological condition: Secondary | ICD-10-CM

## 2016-05-01 DIAGNOSIS — E1159 Type 2 diabetes mellitus with other circulatory complications: Secondary | ICD-10-CM

## 2016-05-01 NOTE — Patient Instructions (Addendum)
Please call the office if you have anymore episodes  Medication Instructions:   No medication changes made  Labwork:  No new labs needed  Testing/Procedures:  No further testing at this time   I recommend watching educational videos on topics of interest to you at:       [email or web address].com  Enter code: HEARTCARE    Follow-Up: It was a pleasure seeing you in the office today. Please call us if you have new issues that need to be addressed before your next appt.  [phone]  Your physician wants you to follow-up in: 6 months.  You will receive a reminder letter in the mail two months in advance. If you don't receive a letter, please call our office to schedule the follow-up appointment.  If you need a refill on your cardiac medications before your next appointment, please call your pharmacy.    Suggested she monitor blood pressure if she has any episodes of confusion

## 2016-05-01 NOTE — Progress Notes (Signed)
Cardiology Office Note  Date:  05/01/2016   ID:  Claire Washington, DOB May 09, 1924, MRN [id number]  PCP:  Claire Harrier, MD   Chief Complaint  Patient presents with  . other    6 month follow up. Meds reviewed by the pt. verbally. Pt. c/o having a spell yesterday of just not being able to remember things.     HPI:  [name] is a very pleasant 84 year-old woman with history of poorly controlled hypertension, chronic dizziness, benign positional vertigo, osteoarthritis, hyperlipidemia, carotid arterial disease (followed by Dr. Lucky Cowboy, 50% on there right), chronic cystitis, diabetes, who presents for routine followup of her dizziness, hypertension Previous TIAs per The patient Long smoking history until age 40 Chronic  dizziness in her head, gait instability Other past medical history reviewed  In follow-up she reports having 20 minutes of confusion yesterday   reports that she was sitting at  her computer, acute onset of confusion  Could not think about how to turn off the computer,  lasted 20 min,   then returned to her baseline  Friend of hers thought she did not sound normal on the phone at this time  Legs are weak, working with physical therapy otherwise denies any other complaints   She takes her blood pressure in the morning, typically well controlled Total chol 211 she is not taking her Crestor, TSH 8.3. No recent hemoglobin A1c available  EKG on today's visit shows no sinus rhythm with rate 65 bpm, no significant ST or T-wave changes    Episodes of syncope in the setting of GI issues/constipation, vasovagal episodes Sulfa allergy  Feels her legs are getting stronger, lower extremity edema on amlodipine   Other past medical history pneumonia November 2015.   syncope 08/25/2011. She was in her kitchen preparing dinner and she had acute loss of consciousness, hit her head, left temple on the refrigerator. She had a subarachnoid hemorrhage noted on evaluation at  [facility], transferred to [facility] for further evaluation.  On her second episode, she reports having a cocktail, milk of magnesia with constipation. She was discharged to rehabilitation. While at rehabilitation, she was sitting on a bathroom commode, with constipation, had taken senna, she had lightheadedness and passed out for a brief period.   Previous Holter monitor and stress test several years ago at [place].   PMH:   has a past medical history of Cancer ([place]); Chronic cystitis; Hyperlipidemia; Hypertension; Leg pain; Osteoarthritis; Small vessel disease; Syncope and collapse (2013); Thyroid nodule; Thyroid nodule; and Vertigo.  PSH:    Past Surgical History:  Procedure Laterality Date  . BILATERAL SALPINGOOPHORECTOMY    . COLONOSCOPY    . CYSTOSCOPY    . TOTAL ABDOMINAL HYSTERECTOMY      Current Outpatient Prescriptions  Medication Sig Dispense Refill  . amLODipine (NORVASC) 2.5 MG tablet Take 2.5 mg by mouth daily.    Marland Kitchen aspirin 81 MG tablet Take 81 mg by mouth daily.    Marland Kitchen losartan (COZAAR) 100 MG tablet Take 100 mg by mouth daily.    . metoprolol tartrate (LOPRESSOR) 25 MG tablet Take 25 mg by mouth 2 (two) times daily.    . Multiple Vitamins-Minerals (CENTRUM SILVER PO) Take by mouth daily. Reported on 06/11/2015    . Multiple Vitamins-Minerals (PRESERVISION AREDS PO) Take by mouth daily.    . rosuvastatin (CRESTOR) 10 MG tablet TAKE 1 TABLET (10 MG TOTAL) BY MOUTH DAILY. 30 tablet 3  . rosuvastatin (CRESTOR) 5 MG tablet Take 5 mg by  mouth daily.     No current facility-administered medications for this visit.      Allergies:   Hydrocortisone and Sulfa antibiotics   Social History:  The patient  reports that she has quit smoking. Her smoking use included Cigarettes. She has a 12.50 pack-year smoking history. She has never used smokeless tobacco. She reports that she does not drink alcohol or use drugs.   Family History:   family history includes Heart disease in her father.     Review of Systems: Review of Systems  Constitutional: Negative.   Respiratory: Negative.   Cardiovascular: Negative.   Gastrointestinal: Negative.   Musculoskeletal: Negative.   Neurological: Negative.        Confusion for 20 minutes  Psychiatric/Behavioral: Negative.   All other systems reviewed and are negative.    PHYSICAL EXAM: VS:  BP (!) 130/54 (BP Location: Left Arm, Patient Position: Sitting, Cuff Size: Normal)   Pulse 65   Ht 5\' 1"  (1.549 m)   Wt 124 lb 8 oz (56.5 kg)   BMI 23.52 kg/m  , BMI Body mass index is 23.52 kg/m. GEN: Well nourished, well developed, in no acute distress , frail  HEENT: normal  Neck: no JVD, carotid bruits, or masses Cardiac: RRR; no murmurs, rubs, or gallops,no edema  Respiratory:  clear to auscultation bilaterally, normal work of breathing GI: soft, nontender, nondistended, + BS MS: no deformity or atrophy  Skin: warm and dry, no rash Neuro:  Strength and sensation are intact Psych: euthymic mood, full affect    Recent Labs: No results found for requested labs within last 8760 hours.    Lipid Panel No results found for: CHOL, HDL, LDLCALC, TRIG    Wt Readings from Last 3 Encounters:  05/01/16 124 lb 8 oz (56.5 kg)  11/03/15 124 lb 8 oz (56.5 kg)  06/11/15 126 lb 12.2 oz (57.5 kg)       ASSESSMENT AND PLAN:  Essential hypertension Blood pressure is well controlled on today's visit. No changes made to the medications. Suggested she monitor her heart rate and blood pressure if she has anymore episodes of confusion  Acute confusional state Etiology of episode unclear, unable to exclude TIA She does have known carotid stenosis. If she has for recurrent episodes, may need to add Plavix,  MRI  Type 2 diabetes mellitus with other circulatory complication, without long-term current use of insulin (HCC) Recommended low carbohydrate diet Labwork done through primary care  Stenosis of right carotid artery Recommended she  follow-up with Dr. Lucky Cowboy  Mixed hyperlipidemia Noncompliant with her Crestor.  Stressed the importance of taking her cholesterol medication Denies having side effects, just did not want to take pill  Smoking hx Suspect underlying COPD Prior smoking likely contributing to her PAD   Total encounter time more than 25 minutes  Greater than 50% was spent in counseling and coordination of care with the patient   Disposition:   F/U  6 months   Orders Placed This Encounter  Procedures  . EKG 12-Lead     Signed, Esmond Plants, M.D., Ph.D. 05/01/2016  [place], [place]

## 2016-05-23 ENCOUNTER — Other Ambulatory Visit: Payer: Self-pay | Admitting: Internal Medicine

## 2016-05-23 DIAGNOSIS — Z1231 Encounter for screening mammogram for malignant neoplasm of breast: Secondary | ICD-10-CM

## 2016-06-15 ENCOUNTER — Telehealth: Payer: Self-pay | Admitting: Cardiovascular Disease

## 2016-06-15 NOTE — Telephone Encounter (Signed)
Returned call to patient. She reports today her BP has been elevated. She stated she ate some salted pretzels the day before. Today, in the morning BP 159/57, HR 69 (before taking metoprolol, amlodipine, and losartan) Today at 1230, 148/68 Today at 1530,  165/64 Patient reports feeling some dizziness. She says she feels it when she's up moving around.  Denies SOB, CP, swelling, Headache or blurred vision. BP over the past few days have been: 2/26   129/52, HR 65 2/27   142/55, HR 70 2/28   149/53, HR 63  Advised patient to see what her BP is tomorrow to see if this was an isolated day where her BP was elevated. Patient said she's been worried about it all day. Patient took BP after sitting for 5 minutes while on the phone and it was 184/72, 85. Patient very concerned about her BP.   Will route to Dr Rockey Situ for advice and Dede Query, RN.

## 2016-06-15 NOTE — Telephone Encounter (Signed)
Pt states she has been dizzy all day and her is BP  165/64. States this morning it was 159/57. Please call. Denies any other symptoms such as nausea, vomiting, CP, SOB.

## 2016-06-15 NOTE — Telephone Encounter (Signed)
Reviewed information with Dr Rockey Situ. He advised patient to take an extra Amlodipine 5 mg by mouth now. He advised patient could take an dose Amlodipine 5 mg by mouth as needed when her BP is elevated.  Called patient and she verbalized understanding. She will take an extra amlodipine and take as needed for elevated BP when appropriate. Advised her to call us if her BP remains high and/or if she finds herself taking extra amlodipine on a regular basis to please call us. She verbalized understanding.

## 2016-06-22 ENCOUNTER — Ambulatory Visit
Admission: RE | Admit: 2016-06-22 | Discharge: 2016-06-22 | Disposition: A | Discharge status: Home / Self Care | Payer: Medicare Other | Source: Ambulatory Visit | Attending: Internal Medicine | Admitting: Internal Medicine

## 2016-06-22 DIAGNOSIS — Z1231 Encounter for screening mammogram for malignant neoplasm of breast: Secondary | ICD-10-CM | POA: Diagnosis present

## 2016-06-27 ENCOUNTER — Other Ambulatory Visit: Payer: Self-pay | Admitting: Internal Medicine

## 2016-06-27 ENCOUNTER — Telehealth: Payer: Self-pay | Admitting: Cardiovascular Disease

## 2016-06-27 DIAGNOSIS — R531 Weakness: Secondary | ICD-10-CM

## 2016-06-27 NOTE — Telephone Encounter (Signed)
Pt calling stating she had an episode of her having another TIA  She had issues with her speech It happened Friday night  Has not had another episode since then Spoke to Dr Ginette Pitman asked patient to call us Please advise

## 2016-06-27 NOTE — Telephone Encounter (Signed)
Spoke w/ pt.  She reports that she was previously advised that she had a TIA in Jan.  On Friday night, she was out to dinner and could not make words during her conversation and is unsure if she had another TIA. She states that Dr. Rockey Situ asked her to call back if she had another episode and he would have her come and set up a "program" w/ himself, Dr. Ginette Pitman and Dr. Lucky Cowboy. She saw Dr. Ginette Pitman today and he urged her to call and get this process started. Pt sched to see Dr. Rockey Situ on 3/22 @ 7:40, placed on wait list in the event of a cancellation.

## 2016-06-30 ENCOUNTER — Encounter: Payer: Self-pay | Admitting: Cardiovascular Disease

## 2016-06-30 ENCOUNTER — Ambulatory Visit (INDEPENDENT_AMBULATORY_CARE_PROVIDER_SITE_OTHER): Payer: Medicare Other | Admitting: Cardiovascular Disease

## 2016-06-30 VITALS — BP 120/52 | HR 63 | Ht 61.0 in | Wt 126.2 lb

## 2016-06-30 DIAGNOSIS — I6521 Occlusion and stenosis of right carotid artery: Secondary | ICD-10-CM

## 2016-06-30 DIAGNOSIS — R410 Disorientation, unspecified: Secondary | ICD-10-CM | POA: Diagnosis not present

## 2016-06-30 DIAGNOSIS — E782 Mixed hyperlipidemia: Secondary | ICD-10-CM

## 2016-06-30 DIAGNOSIS — Z87891 Personal history of nicotine dependence: Secondary | ICD-10-CM

## 2016-06-30 DIAGNOSIS — E1159 Type 2 diabetes mellitus with other circulatory complications: Secondary | ICD-10-CM | POA: Diagnosis not present

## 2016-06-30 DIAGNOSIS — I158 Other secondary hypertension: Secondary | ICD-10-CM

## 2016-06-30 DIAGNOSIS — R42 Dizziness and giddiness: Secondary | ICD-10-CM | POA: Diagnosis not present

## 2016-06-30 MED ORDER — CLOPIDOGREL BISULFATE 75 MG PO TABS: 75.0000 mg | ORAL_TABLET | Freq: Every day | ORAL | 3 refills | Status: DC

## 2016-06-30 NOTE — Progress Notes (Signed)
Cardiology Office Note  Date:  06/30/2016   ID:  Claire Washington, DOB [birth year], MRN [id number]  PCP:  Tracie Harrier, MD   Chief Complaint  Patient presents with  . other    Pt. c/o a spell where she couldn't find the words as she was talking with a friend, this concerned her as if she had a TIA.     HPI:  Claire Washington is a very pleasant 83 year-old woman with history of  hypertension, chronic dizziness, benign positional vertigo, osteoarthritis, hyperlipidemia, carotid arterial disease (followed by Dr. Lucky Cowboy, 50% on there right), chronic cystitis, diabetes, who presents for routine followup of her dizziness, hypertension Prior history of smoking Previous TIAs per the patient Long smoking history until age 55 Chronic  dizziness in her head, gait instability  In follow-up today she reports having episode of word confusion lasting less than 1 minute while sitting at a dinner party. Several words did not, now bright, she felt confused, and symptoms seem to resolve  On her last clinic visit she had 20 minute episode of confusion, presented ball sitting at her computer. Could not think about how to turn off the computer,   then returned to her baseline  Friend of hers thought she did not sound normal on the phone at this time   Chronic leg weakness, balance instability Recently seen by primary care, MRI set up No regular exercise, walks with a walker  Previous Total chol 211  TSH 8.3.  She is taking very low-dose Crestor several days per week  Other past medical history reviewed Episodes of syncope in the setting of GI issues/constipation, vasovagal episodes Sulfa allergy  History of swelling on high-dose amlodipine pneumonia November 2015.   syncope 08/25/2011. She was in her kitchen preparing dinner and she had acute loss of consciousness, hit her head, left temple on the refrigerator. She had a subarachnoid hemorrhage noted on evaluation at [facility], transferred to [facility] for  further evaluation.  On her second episode, she reports having a cocktail, milk of magnesia with constipation. She was discharged to rehabilitation. While at rehabilitation, she was sitting on a bathroom commode, with constipation, had taken senna, she had lightheadedness and passed out for a brief period.   Previous Holter monitor and stress test several years ago at [place].   PMH:   has a past medical history of Cancer ([place]); Chronic cystitis; Hyperlipidemia; Hypertension; Leg pain; Osteoarthritis; Small vessel disease; Syncope and collapse (2013); Thyroid nodule; Thyroid nodule; and Vertigo.  PSH:    Past Surgical History:  Procedure Laterality Date  . BILATERAL SALPINGOOPHORECTOMY    . COLONOSCOPY    . CYSTOSCOPY    . TOTAL ABDOMINAL HYSTERECTOMY      Current Outpatient Prescriptions  Medication Sig Dispense Refill  . amLODipine (NORVASC) 2.5 MG tablet Take 5 mg by mouth daily.     Marland Kitchen losartan (COZAAR) 100 MG tablet Take 100 mg by mouth daily.    . metoprolol tartrate (LOPRESSOR) 25 MG tablet Take 25 mg by mouth 2 (two) times daily.    . Multiple Vitamins-Minerals (CENTRUM SILVER PO) Take by mouth daily. Reported on 06/11/2015    . rosuvastatin (CRESTOR) 10 MG tablet TAKE 1 TABLET (10 MG TOTAL) BY MOUTH DAILY. (Patient taking differently: Take 5 mg by mouth daily. ) 30 tablet 3  . clopidogrel (PLAVIX) 75 MG tablet Take 1 tablet (75 mg total) by mouth daily. 90 tablet 3   No current facility-administered medications for this visit.  Allergies:   Hydrocortisone and Sulfa antibiotics   Social History:  The patient  reports that she has quit smoking. Her smoking use included Cigarettes. She has a 12.50 pack-year smoking history. She has never used smokeless tobacco. She reports that she does not drink alcohol or use drugs.   Family History:   family history includes Heart disease in her father.    Review of Systems: Review of Systems  Constitutional: Negative.    Respiratory: Negative.   Cardiovascular: Negative.   Gastrointestinal: Negative.   Musculoskeletal: Negative.        Gait instability  Neurological: Positive for dizziness.       Episodes of confusion  Psychiatric/Behavioral: Negative.   All other systems reviewed and are negative.    PHYSICAL EXAM: VS:  BP (!) 120/52 (BP Location: Left Arm, Patient Position: Sitting, Cuff Size: Normal)   Pulse 63   Ht 5\' 1"  (1.549 m)   Wt 126 lb 4 oz (57.3 kg)   BMI 23.85 kg/m  , BMI Body mass index is 23.85 kg/m. GEN: Well nourished, well developed, in no acute distress , unstable gait HEENT: normal  Neck: no JVD, carotid bruits, or masses Cardiac: RRR; no murmurs, rubs, or gallops,no edema  Respiratory:  clear to auscultation bilaterally, normal work of breathing GI: soft, nontender, nondistended, + BS MS: no deformity or atrophy  Skin: warm and dry, no rash Neuro:  Strength and sensation are intact Psych: euthymic mood, full affect    Recent Labs: No results found for requested labs within last 8760 hours.    Lipid Panel No results found for: CHOL, HDL, LDLCALC, TRIG    Wt Readings from Last 3 Encounters:  06/30/16 126 lb 4 oz (57.3 kg)  05/01/16 124 lb 8 oz (56.5 kg)  11/03/15 124 lb 8 oz (56.5 kg)       ASSESSMENT AND PLAN:  Other secondary hypertension We have recommended she take losartan in the evening, metoprolol twice a day, amlodipine in the morning. Blood pressure high before her morning medication Feels very tired after she takes all of her medications in the morning  Dizziness Periods of lightheadedness, gait instability Recommended she monitor pressures when she has dizziness  Smoking hx Currently a nonsmoker  Type 2 diabetes mellitus with other circulatory complication, without long-term current use of insulin ([place]) We have encouraged continued exercise, careful diet management in an effort to lose weight. Followed by primary care  Mixed  hyperlipidemia Tolerating low-dose Crestor, previous myalgias  Acute confusion Etiology of her episodes is unclear, long discussion with her today She has seen primary care, MRI without contrast ordered Unable to exclude cerebrovascular disease, vertebral artery disease Suggested she talk with primary care and consider MRI with and without contrast   Total encounter time more than 25 minutes  Greater than 50% was spent in counseling and coordination of care with the patient   Disposition:   F/U  6 months  No orders of the defined types were placed in this encounter.    Signed, Esmond Plants, M.D., Ph.D. 06/30/2016  [place], [place] \

## 2016-06-30 NOTE — Patient Instructions (Addendum)
Medication Instructions:   Please move the losartan to the evening Please monitor your pressures If they run low, call the office  Please hold the aspirin Start plavix one a day   Labwork:  No new labs needed  Testing/Procedures:  No further testing at this time   I recommend watching educational videos on topics of interest to you at:       [email or web address].com  Enter code: [email or web address]    Follow-Up: It was a pleasure seeing you in the office today. Please call us if you have new issues that need to be addressed before your next appt.  [phone]  Your physician wants you to follow-up in: 3 months.  You will receive a reminder letter in the mail two months in advance. If you don't receive a letter, please call our office to schedule the follow-up appointment.  If you need a refill on your cardiac medications before your next appointment, please call your pharmacy.

## 2016-07-06 ENCOUNTER — Ambulatory Visit: Payer: BLUE CROSS/BLUE SHIELD | Admitting: Cardiovascular Disease

## 2016-07-10 ENCOUNTER — Ambulatory Visit: Payer: BLUE CROSS/BLUE SHIELD

## 2016-07-26 ENCOUNTER — Ambulatory Visit
Admission: RE | Admit: 2016-07-26 | Discharge: 2016-07-26 | Disposition: A | Discharge status: Home / Self Care | Payer: Medicare Other | Source: Ambulatory Visit | Attending: Internal Medicine | Admitting: Internal Medicine

## 2016-07-26 DIAGNOSIS — I739 Peripheral vascular disease, unspecified: Secondary | ICD-10-CM | POA: Diagnosis not present

## 2016-07-26 DIAGNOSIS — R531 Weakness: Secondary | ICD-10-CM | POA: Insufficient documentation

## 2016-07-26 DIAGNOSIS — G319 Degenerative disease of nervous system, unspecified: Secondary | ICD-10-CM | POA: Diagnosis not present

## 2016-08-04 ENCOUNTER — Encounter: Payer: Self-pay | Admitting: Cardiovascular Disease

## 2016-08-04 ENCOUNTER — Ambulatory Visit (INDEPENDENT_AMBULATORY_CARE_PROVIDER_SITE_OTHER): Payer: Medicare Other | Admitting: Cardiovascular Disease

## 2016-08-04 VITALS — BP 134/50 | HR 65 | Ht 61.0 in | Wt 125.5 lb

## 2016-08-04 DIAGNOSIS — G459 Transient cerebral ischemic attack, unspecified: Secondary | ICD-10-CM

## 2016-08-04 DIAGNOSIS — R55 Syncope and collapse: Secondary | ICD-10-CM

## 2016-08-04 DIAGNOSIS — I6521 Occlusion and stenosis of right carotid artery: Secondary | ICD-10-CM

## 2016-08-04 DIAGNOSIS — I639 Cerebral infarction, unspecified: Secondary | ICD-10-CM | POA: Diagnosis not present

## 2016-08-04 DIAGNOSIS — I158 Other secondary hypertension: Secondary | ICD-10-CM

## 2016-08-04 DIAGNOSIS — R42 Dizziness and giddiness: Secondary | ICD-10-CM

## 2016-08-04 NOTE — Patient Instructions (Addendum)
Medication Instructions:   No medication changes made  Labwork:  No new labs needed  Testing/Procedures:  We will order an echo for TIA/CVA Echocardiography is a painless test that uses sound waves to create images of your heart. It provides your doctor with information about the size and shape of your heart and how well your heart's chambers and valves are working. This procedure takes approximately one hour. There are no restrictions for this procedure.  Event monitor for TIA, CVA Your physician has recommended that you wear an event monitor. Event monitors are medical devices that record the heart's electrical activity. Doctors most often [place] these monitors to diagnose arrhythmias. Arrhythmias are problems with the speed or rhythm of the heartbeat. The monitor is a small, portable device. You can wear one while you do your normal daily activities. This is usually used to diagnose what is causing palpitations/syncope (passing out).  We will send your info to [employer].  They will contact you to verify your address before mailing the monitor to your home.  It is very important that you answer this call. When you receive the monitor, call the 1-800 # on the box. They will walk you through application and activate the monitor.    Follow-Up: It was a pleasure seeing you in the office today. Please call us if you have new issues that need to be addressed before your next appt.  [phone]  Your physician wants you to follow-up in: 3 months   If you need a refill on your cardiac medications before your next appointment, please call your pharmacy.    Echocardiogram An echocardiogram, or echocardiography, uses sound waves (ultrasound) to produce an image of your heart. The echocardiogram is simple, painless, obtained within a short period of time, and offers valuable information to your health care provider. The images from an echocardiogram can provide information such as:  Evidence  of coronary artery disease (CAD).  Heart size.  Heart muscle function.  Heart valve function.  Aneurysm detection.  Evidence of a past heart attack.  Fluid buildup around the heart.  Heart muscle thickening.  Assess heart valve function. Tell a health care provider about:  Any allergies you have.  All medicines you are taking, including vitamins, herbs, eye drops, creams, and over-the-counter medicines.  Any problems you or family members have had with anesthetic medicines.  Any blood disorders you have.  Any surgeries you have had.  Any medical conditions you have.  Whether you are pregnant or may be pregnant. What happens before the procedure? No special preparation is needed. Eat and drink normally. What happens during the procedure?  In order to produce an image of your heart, gel will be applied to your chest and a wand-like tool (transducer) will be moved over your chest. The gel will help transmit the sound waves from the transducer. The sound waves will harmlessly bounce off your heart to allow the heart images to be captured in real-time motion. These images will then be recorded.  You may need an IV to receive a medicine that improves the quality of the pictures. What happens after the procedure? You may return to your normal schedule including diet, activities, and medicines, unless your health care provider tells you otherwise. This information is not intended to replace advice given to you by your health care provider. Make sure you discuss any questions you have with your health care provider. Document Released: 03/31/2000 Document Revised: 11/20/2015 Document Reviewed: 12/09/2012 Elsevier Interactive Patient Education  2017 [employer].  Cardiac Event Monitoring A cardiac event monitor is a small recording device that is used to detect abnormal heart rhythms (arrhythmias). The monitor is used to record your heart rhythm when you have symptoms, such  as:  Fast heartbeats (palpitations), such as heart racing or fluttering.  Dizziness.  Fainting or light-headedness.  Unexplained weakness. Some monitors are wired to electrodes placed on your chest. Electrodes are flat, sticky disks that attach to your skin. Other monitors may be hand-held or worn on the wrist. The monitor can be worn for up to 30 days. If the monitor is attached to your chest, a technician will prepare your chest for the electrode placement and show you how to work the monitor. Take time to practice using the monitor before you leave the office. Make sure you understand how to send the information from the monitor to your health care provider. In some cases, you may need to use a landline telephone instead of a cell phone. What are the risks? Generally, this device is safe to use, but it possible that the skin under the electrodes will become irritated. How to use your cardiac event monitor  Wear your monitor at all times, except when you are in water:  Do not let the monitor get wet.  Take the monitor off when you bathe. Do not swim or use a hot tub with it on.  Keep your skin clean. Do not put body lotion or moisturizer on your chest.  Change the electrodes as told by your health care provider or any time they stop sticking to your skin. You may need to use medical tape to keep them on.  Try to put the electrodes in slightly different places on your chest to help prevent skin irritation. They must remain in the area under your left breast and in the upper right section of your chest.  Make sure the monitor is safely clipped to your clothing or in a location close to your body that your health care provider recommends.  Press the button to record as soon as you feel heart-related symptoms, such as:  Dizziness.  Weakness.  Light-headedness.  Palpitations.  Thumping or pounding in your chest.  Shortness of breath.  Unexplained weakness.  Keep a diary of  your activities, such as walking, doing chores, and taking medicine. It is very important to note what you were doing when you pushed the button to record your symptoms. This will help your health care provider determine what might be contributing to your symptoms.  Send the recorded information as recommended by your health care provider. It may take some time for your health care provider to process the results.  Change the batteries as told by your health care provider.  Keep electronic devices away from your monitor. This includes:  Tablets.  MP3 players.  Cell phones.  While wearing your monitor you should avoid:  Electric blankets.  Armed forces operational officer.  Electric toothbrushes.  Microwave ovens.  Magnets.  Metal detectors. Get help right away if:  You have chest pain.  You have extreme difficulty breathing or shortness of breath.  You develop a very fast heartbeat that persists.  You develop dizziness that does not go away.  You faint or constantly feel like you are about to faint. Summary  A cardiac event monitor is a small recording device that is used to help detect abnormal heart rhythms (arrhythmias).  The monitor is used to record your heart rhythm when you have  heart-related symptoms.  Make sure you understand how to send the information from the monitor to your health care provider.  It is important to press the button on the monitor when you have any heart-related symptoms.  Keep a diary of your activities, such as walking, doing chores, and taking medicine. It is very important to note what you were doing when you pushed the button to record your symptoms. This will help your health care provider learn what might be causing your symptoms. This information is not intended to replace advice given to you by your health care provider. Make sure you discuss any questions you have with your health care provider. Document Released: 01/11/2008 Document Revised:  03/18/2016 Document Reviewed: 03/18/2016 Elsevier Interactive Patient Education  2017 [employer].

## 2016-08-04 NOTE — Progress Notes (Signed)
Cardiology Office Note  Date:  08/04/2016   ID:  Aneisa Karren, DOB 01/15/1925, MRN [id number]  PCP:  Tracie Harrier, MD   Chief Complaint  Patient presents with  . OTHER    C/o dizziness. Meds reviewed verbally with pt.    HPI:  Ms. Salay is a very pleasant 83 year-old woman with history of   hypertension,  chronic dizziness,  benign positional vertigo,  osteoarthritis,  hyperlipidemia,  carotid arterial disease (followed by Dr. Lucky Cowboy, 50% on there right),  chronic cystitis,  diabetes,  Long smoking history until age 18 Previous TIAs per the patient Chronic  dizziness in her head, gait instability, for years, progressive who presents for routine followup of her dizziness, hypertension, TIA/CVAs  In follow-up she reports having worsening equilibrium and dizziness Chronic issue dating back several years, worse recently Difficult time with equilibrium. Does not think it is vertigo Chronic leg weakness, balance instability  MRI of the head performed showing Atrophy and small vessel disease. Small foci of restricted diffusion in the RIGHT superior cerebellum and LEFT lateral occipital cortex, consistent with nonhemorrhagic embolic infarcts.  She denies any tachycardia or palpitations concerning for arrhythmia  Previous office visits she reported having episode of word confusion lasting less than 1 minute while sitting at a dinner party. Several words did not, now bright, she felt confused, and symptoms seem to resolve  Also reported having previous 20 minute episode of confusion, presented ball sitting at her computer. Could not think about how to turn off the computer,   then returned to her baseline  Friend of hers thought she did not sound normal on the phone at this time   No regular exercise, walks with a walker  Previous Total chol 211  TSH 8.3.  She is taking very low-dose Crestor several days per week  EKG personally reviewed by myself on todays  visit Shows normal sinus rhythm with rate 65 bpm no significant ST or T-wave changes  Other past medical history reviewed Episodes of syncope in the setting of GI issues/constipation, vasovagal episodes Sulfa allergy  History of swelling on high-dose amlodipine pneumonia November 2015.   syncope 08/25/2011. She was in her kitchen preparing dinner and she had acute loss of consciousness, hit her head, left temple on the refrigerator. She had a subarachnoid hemorrhage noted on evaluation at [facility], transferred to [facility] for further evaluation.   On her second episode, she reports having a cocktail, milk of magnesia with constipation. She was discharged to rehabilitation. While at rehabilitation, she was sitting on a bathroom commode, with constipation, had taken senna, she had lightheadedness and passed out for a brief period.   Previous Holter monitor and stress test several years ago at [facility].   PMH:   has a past medical history of Cancer ([place]); Chronic cystitis; Hyperlipidemia; Hypertension; Leg pain; Osteoarthritis; Small vessel disease; Syncope and collapse (2013); Thyroid nodule; Thyroid nodule; and Vertigo.  PSH:    Past Surgical History:  Procedure Laterality Date  . BILATERAL SALPINGOOPHORECTOMY    . COLONOSCOPY    . CYSTOSCOPY    . TOTAL ABDOMINAL HYSTERECTOMY      Current Outpatient Prescriptions  Medication Sig Dispense Refill  . amLODipine (NORVASC) 2.5 MG tablet Take 2.5 mg by mouth daily.     . clopidogrel (PLAVIX) 75 MG tablet Take 1 tablet (75 mg total) by mouth daily. 90 tablet 3  . losartan (COZAAR) 100 MG tablet Take 100 mg by mouth daily.    . metoprolol tartrate (  LOPRESSOR) 25 MG tablet Take 25 mg by mouth 2 (two) times daily.    . Multiple Vitamins-Minerals (CENTRUM SILVER PO) Take by mouth daily. Reported on 06/11/2015    . rosuvastatin (CRESTOR) 10 MG tablet TAKE 1 TABLET (10 MG TOTAL) BY MOUTH DAILY. (Patient taking differently: Take 5 mg by mouth  daily. ) 30 tablet 3   No current facility-administered medications for this visit.      Allergies:   Hydrocortisone and Sulfa antibiotics   Social History:  The patient  reports that she has quit smoking. Her smoking use included Cigarettes. She has a 12.50 pack-year smoking history. She has never used smokeless tobacco. She reports that she does not drink alcohol or use drugs.   Family History:   family history includes Heart disease in her father.    Review of Systems: Review of Systems  Constitutional: Negative.   Respiratory: Negative.   Cardiovascular: Negative.   Gastrointestinal: Negative.   Musculoskeletal: Negative.        Gait instability  Neurological: Positive for dizziness.       Episodes of confusion  Psychiatric/Behavioral: Negative.   All other systems reviewed and are negative.    PHYSICAL EXAM: VS:  BP (!) 134/50 (BP Location: Left Arm, Patient Position: Sitting, Cuff Size: Normal)   Pulse 65   Ht 5\' 1"  (1.549 m)   Wt 125 lb 8 oz (56.9 kg)   BMI 23.71 kg/m  , BMI Body mass index is 23.71 kg/m. GEN: Well nourished, well developed, in no acute distress , unstable gait, walks with a walker HEENT: normal  Neck: no JVD, carotid bruits, or masses Cardiac: RRR; no murmurs, rubs, or gallops,no edema  Respiratory:  clear to auscultation bilaterally, normal work of breathing GI: soft, nontender, nondistended, + BS MS: no deformity or atrophy  Skin: warm and dry, no rash Neuro:  Strength and sensation are intact Psych: euthymic mood, full affect    Recent Labs: No results found for requested labs within last 8760 hours.    Lipid Panel No results found for: CHOL, HDL, LDLCALC, TRIG    Wt Readings from Last 3 Encounters:  08/04/16 125 lb 8 oz (56.9 kg)  06/30/16 126 lb 4 oz (57.3 kg)  05/01/16 124 lb 8 oz (56.5 kg)       ASSESSMENT AND PLAN:  Other secondary hypertension Blood pressure is well controlled on today's visit. No changes made to  the medications. Amlodipine dose recently cut in half by primary care  Dizziness Periods of lightheadedness, gait instability, confusion MRI showing possible embolic strokes  Stroke Multiple embolic strokes on MRI Etiology unclear, moderate carotid disease on the right followed by Dr. Lucky Cowboy We have recommended echocardiogram to evaluate LV function, thrombus  and event monitor to rule out arrhythmia such as atrial fibrillation She had recent carotid ultrasound with Dr. Lucky Cowboy  2017, stable disease Long discussion today concerning her symptoms  Smoking hx Currently a nonsmoker  Type 2 diabetes mellitus with other circulatory complication, without long-term current use of insulin ([place]) Recommended oh carbohydrate diet  Mixed hyperlipidemia Tolerating low-dose Crestor, previous myalgias  Acute confusion On her last clinic visit had confusion for short period of time, possibly from TIA MRI head complete, she did not want MRI with contrast Unable to exclude cerebrovascular disease such as vertebral artery disease Scheduled to see neurology   Total encounter time more than 45 minutes  Greater than 50% was spent in counseling and coordination of care with the patient  Disposition:   F/U  6 months   Orders Placed This Encounter  Procedures  . EKG 12-Lead     Signed, Esmond Plants, M.D., Ph.D. 08/04/2016  [place], [place] \

## 2016-08-14 ENCOUNTER — Encounter (INDEPENDENT_AMBULATORY_CARE_PROVIDER_SITE_OTHER): Payer: Medicare Other

## 2016-08-14 DIAGNOSIS — R42 Dizziness and giddiness: Secondary | ICD-10-CM | POA: Diagnosis not present

## 2016-08-14 DIAGNOSIS — I158 Other secondary hypertension: Secondary | ICD-10-CM | POA: Diagnosis not present

## 2016-08-14 DIAGNOSIS — R55 Syncope and collapse: Secondary | ICD-10-CM

## 2016-08-14 DIAGNOSIS — I639 Cerebral infarction, unspecified: Secondary | ICD-10-CM | POA: Diagnosis not present

## 2016-08-14 DIAGNOSIS — G459 Transient cerebral ischemic attack, unspecified: Secondary | ICD-10-CM

## 2016-08-30 DIAGNOSIS — Z8673 Personal history of transient ischemic attack (TIA), and cerebral infarction without residual deficits: Secondary | ICD-10-CM | POA: Insufficient documentation

## 2016-08-30 DIAGNOSIS — R270 Ataxia, unspecified: Secondary | ICD-10-CM | POA: Insufficient documentation

## 2016-09-08 ENCOUNTER — Ambulatory Visit (INDEPENDENT_AMBULATORY_CARE_PROVIDER_SITE_OTHER): Payer: Medicare Other

## 2016-09-08 ENCOUNTER — Other Ambulatory Visit: Payer: Self-pay

## 2016-09-08 ENCOUNTER — Telehealth: Payer: Self-pay | Admitting: Cardiovascular Disease

## 2016-09-08 DIAGNOSIS — G459 Transient cerebral ischemic attack, unspecified: Secondary | ICD-10-CM

## 2016-09-08 DIAGNOSIS — R55 Syncope and collapse: Secondary | ICD-10-CM | POA: Diagnosis not present

## 2016-09-08 DIAGNOSIS — R42 Dizziness and giddiness: Secondary | ICD-10-CM | POA: Diagnosis not present

## 2016-09-08 DIAGNOSIS — I639 Cerebral infarction, unspecified: Secondary | ICD-10-CM | POA: Diagnosis not present

## 2016-09-08 DIAGNOSIS — I158 Other secondary hypertension: Secondary | ICD-10-CM

## 2016-09-08 NOTE — Telephone Encounter (Signed)
Patient was here today to have echocardiogram and she thought that she would also see Dr. Rockey Situ. Reviewed with her that he was not here today. She reports that her dizziness continues to be a problem and it is really impairing her ability to do any activities. Reviewed previous office visit information and how this echocardiogram and the event monitor can help to tell us see if her heart is causing any of these problems. She states that she has 3 more days with the monitor and then she will mail it back in. Let her know that we would call her with results once Dr. Rockey Situ reviews them. She really wants to see Dr. Rockey Situ but let her know that it would not be until July. Offered her earlier appointment in 2 weeks with Ignacia Bayley NP and she was agreeable with this. She verbalized understanding of our conversation, agreement with plan, and had no further questions at this time.

## 2016-09-08 NOTE — Telephone Encounter (Signed)
Patient in office for echo but thought she was going to see Dr. Rockey Situ.    Patient has several concerns and questions for provider   Dr. Melrose Nakayama  rx'd new medication that gollan has told her before not to take - Melrose Nakayama said start taking asa 81 po q 3  x weekly in addition to plavix  75 mg po daily     Wants to know what recent test results are    Has concerns weakness and dizziness is getting worse also c/o weightloss   Patient wants asap appt next available in July for gollan   Scheduled with berge on 6/6 at 930  Added to waitlist

## 2016-09-20 ENCOUNTER — Ambulatory Visit (INDEPENDENT_AMBULATORY_CARE_PROVIDER_SITE_OTHER): Payer: Medicare Other | Admitting: Nurse Practitioner

## 2016-09-20 ENCOUNTER — Encounter: Payer: Self-pay | Admitting: Nurse Practitioner

## 2016-09-20 VITALS — BP 130/52 | HR 56 | Ht 61.0 in | Wt 120.8 lb

## 2016-09-20 DIAGNOSIS — I10 Essential (primary) hypertension: Secondary | ICD-10-CM | POA: Diagnosis not present

## 2016-09-20 DIAGNOSIS — R001 Bradycardia, unspecified: Secondary | ICD-10-CM

## 2016-09-20 DIAGNOSIS — R42 Dizziness and giddiness: Secondary | ICD-10-CM | POA: Diagnosis not present

## 2016-09-20 DIAGNOSIS — I6521 Occlusion and stenosis of right carotid artery: Secondary | ICD-10-CM

## 2016-09-20 MED ORDER — METOPROLOL TARTRATE 25 MG PO TABS: 12.5000 mg | ORAL_TABLET | Freq: Two times a day (BID) | ORAL | 3 refills | Status: DC

## 2016-09-20 MED ORDER — MECLIZINE HCL 12.5 MG PO TABS: 12.5000 mg | ORAL_TABLET | Freq: Three times a day (TID) | ORAL | 3 refills | Status: DC | PRN

## 2016-09-20 NOTE — Patient Instructions (Addendum)
Medication Instructions:  Your physician has recommended you make the following change in your medication:  DECREASE metoprolol to 12.5mg  twice a day START taking meclizine 12.5mg  three times a day as needed.   Labwork: none  Testing/Procedures: none  Follow-Up: Your physician recommends that you schedule a follow-up appointment in: 3 months with Dr. Rockey Situ.   Any Other Special Instructions Will Be Listed Below (If Applicable).     If you need a refill on your cardiac medications before your next appointment, please call your pharmacy.

## 2016-09-20 NOTE — Progress Notes (Signed)
Office Visit    Patient Name: Claire Washington Date of Encounter: 09/20/2016  Primary Care Provider:  Tracie Harrier, MD Primary Cardiologist:  Johnny Bridge, MD   Chief Complaint    83 year old female with a history of syncope, subdural hematoma, chronic dizziness, hypertension, hyperlipidemia, and embolic stroke noted on MRI, who presents for follow-up.  Past Medical History    Past Medical History:  Diagnosis Date  . Basal cell carcinoma    basel cell  . Chronic cystitis   . Embolic stroke ([place])    a. 07/2016 MRI Brain: small right superior cerebellum and left lateral occipital cortex infarcts;  b. 08/2016 Echo: EF 60-65%, Gr1 DD, mild MR;  c. 08/2016 30 Day Event Monitor: No significant tachy/bradyarrhythmias. No afib.  Marland Kitchen Hyperlipidemia   . Hypertension   . Leg pain   . Osteoarthritis   . Small vessel disease   . Subdural hematoma ([place])    a. 2013 in setting of syncope.  . Syncope and collapse    a. 2013 - syncope and fall w/ resultant subdural hematoma; b. prev nl echo and stress test @ [facility].  Marland Kitchen Thyroid nodule   . Vertigo & Chronic Dizziness    a. Previously seen by ENT with Physical Therapy.   Past Surgical History:  Procedure Laterality Date  . BILATERAL SALPINGOOPHORECTOMY    . COLONOSCOPY    . CYSTOSCOPY    . TOTAL ABDOMINAL HYSTERECTOMY      Allergies  Allergies  Allergen Reactions  . Hydrocortisone Swelling  . Sulfa Antibiotics     History of Present Illness    83 year old female with the above complex past medical history including syncope in 2013 with resultant subdural hematoma. She was previously evaluated with stress testing and echocardiogram at [facility], both of which be normal. Other history includes hypertension, hyperlipidemia, and chronic dizziness/vertigo. With regards to her dizziness, she has been evaluated by ENT in the past and even underwent physical therapy without improvement in dizziness. She has previously been  offered meclizine but has never actually tried it. She is known to have moderate carotid arterial disease on the right which has been stable. In April of this year, she underwent MRI of the brain which showed evidence of embolic strokes involving the right superior cerebellum and left lateral occipital cortex. She has been on Plavix therapy and was advised by neurology to start aspirin a few days a week but she has been reluctant to do this given prior history of subdural hematoma. She says that she is always dizzy and that when she stands and walks, she feels unsteady. In the setting of MRI findings, she saw Dr. Rockey Situ on April 20 and subsequently underwent echocardiography and event monitoring. Echo showed normal LV function without significant valvular disease. Event monitoring did not show any evidence of atrial fibrillation or any significant tachy or brady arrhythmias. On review of event monitoring, she did frequently drop her heart rates into the 40s generally occurring sometime between 9:00 11:00 in the morning. There were no reported symptoms during that period of time. All reported symptoms were during periods of sinus rhythm. She says that she takes her beta blocker sometime around 7 or 8:00 in the morning. She denies chest pain, palpitations, dyspnea, PND, orthopnea, edema, or early satiety. She did say the other day she was sitting and noted moderate left shoulder discomfort that was not worse with any movement. This lasted about 10 or so minutes and resolve spontaneously. There  are no associated symptoms.   Home Medications    Prior to Admission medications   Medication Sig Start Date End Date Taking? Authorizing Provider  amLODipine (NORVASC) 2.5 MG tablet Take 2.5 mg by mouth daily.    Yes [provider]  clopidogrel (PLAVIX) 75 MG tablet Take 1 tablet (75 mg total) by mouth daily. 06/30/16  Yes Minna Merritts, MD  losartan (COZAAR) 100 MG tablet Take 100 mg by mouth daily.   Yes  [provider]  Multiple Vitamins-Minerals (CENTRUM SILVER PO) Take by mouth daily. Reported on 06/11/2015   Yes [provider]  rosuvastatin (CRESTOR) 10 MG tablet TAKE 1 TABLET (10 MG TOTAL) BY MOUTH DAILY. Patient taking differently: Take 5 mg by mouth daily.  12/09/15  Yes Minna Merritts, MD  meclizine (ANTIVERT) 12.5 MG tablet Take 1 tablet (12.5 mg total) by mouth 3 (three) times daily as needed for dizziness. 09/20/16   Rogelia Mire, NP  metoprolol tartrate (LOPRESSOR) 25 MG tablet Take 0.5 tablets (12.5 mg total) by mouth 2 (two) times daily. 09/20/16 12/19/16  Rogelia Mire, NP    Review of Systems    As above, constant dizziness that is worsened by standing with unsteady gait.  She had a brief episode of shoulder discomfort at rest. She denies chest pain, dyspnea, palpitations, PND, orthopnea, edema, or early satiety. All other systems reviewed and are otherwise negative except as noted above.  Physical Exam    VS:  BP (!) 130/52 (BP Location: Left Arm, Patient Position: Sitting, Cuff Size: Normal)   Pulse (!) 56   Ht 5\' 1"  (1.549 m)   Wt 120 lb 12 oz (54.8 kg)   BMI 22.82 kg/m  , BMI Body mass index is 22.82 kg/m.  Orthostatic VS for the past 24 hrs:  BP- Lying Pulse- Lying BP- Sitting Pulse- Sitting BP- Standing at 0 minutes Pulse- Standing at 0 minutes  09/20/16 0919 136/58 56 120/54 57 158/58 65   GEN: Well nourished, well developed, in no acute distress.  HEENT: normal.  Neck: Supple, no JVD, carotid bruits, or masses. Cardiac: RRR, 2/6 systolic murmur heard throughout. No rubs, or gallops. No clubbing, cyanosis, edema.  Radials/DP/PT 2+ and equal bilaterally.  Respiratory:  Respirations regular and unlabored, clear to auscultation bilaterally. GI: Soft, nontender, nondistended, BS + x 4. MS: no deformity or atrophy. Skin: warm and dry, no rash. Neuro:  Strength and sensation are intact. Psych: Normal affect.  Accessory Clinical  Findings    ECG - sinus pericardia, 56, LVH. No acute changes. 2D Echocardiogram - nl EF, gr1 DD, mild MR 30 day event monitor: no afib.  Ss reported during periods of sinus rhythm.  Some sinus brady down to the 40's w/o significant pauses or tachyarrhythmias.  Assessment & Plan    1.  Dizziness: Patient has a long history of dizziness and vertigo appears to evaluate her by ENT. She has undergone physical therapy without any significant improvement. Recent MRI suggested right superior cerebellar and left lateral occipital cortex infarcts-presumed to be embolic in nature. Recent evaluation with echocardiogram did not show any significant valvular disease or evidence of LV thrombus. LV function was normal. Event monitoring did not show any evidence of atrial fibrillation or other arrhythmias to account for her symptoms. She wishes to try meclizine therapy and therefore I have given her a prescription for 12.5 mg to be taken 3 times a day as needed. She does have follow-up with neurology.  2.  Essential hypertension: She is not particularly orthostatic in the side do not think this is the primary cause of her dizziness. I am going to reduce her beta blocker dose given episodes of sinus bradycardia noted on several mornings during her event monitoring.  3. Sinus bradycardia: Heart rates dipped into the high 40s while wearing the event monitor. This seemed to occur sometime between 9:00 and 11:00 on several mornings which would coincide with her beta blocker kicking in. I'm going to reduce her metoprolol to 12.5 mg twice a day. Of note, she did not report any symptoms during those episodes.  4. Hyperlipidemia: She remains on rosuvastatin therapy. This is followed by primary care.  5. Disposition: Patient will follow-up with Dr. Rockey Situ approximately 3 months or sooner if necessary.  Murray Hodgkins, NP 09/20/2016, 1:15 PM

## 2016-10-02 ENCOUNTER — Ambulatory Visit: Payer: BLUE CROSS/BLUE SHIELD | Admitting: Cardiovascular Disease

## 2016-10-09 ENCOUNTER — Telehealth: Payer: Self-pay | Admitting: Cardiovascular Disease

## 2016-10-09 MED ORDER — AMLODIPINE BESYLATE 10 MG PO TABS: 10.0000 mg | ORAL_TABLET | Freq: Every day | ORAL | 3 refills | Status: DC

## 2016-10-09 NOTE — Telephone Encounter (Signed)
Increase amlodipine to 10 mg daily.  Might benefit from nurse visit this week.

## 2016-10-09 NOTE — Telephone Encounter (Signed)
Spoke with patient. SHe reports increased BP since last Friday, with SBP >200 over the weekend. She left to go out of town to [place] for a wedding last Thursday. States she ate some foods with salt that she usually does not do. Friday, Saturday and Sunday she ate food she had brought that did not have salt in them and drank plenty of water. She did not go to the wedding and stayed inside and rested.   Since being home since yesterday BP is still elevated.  She increased her Amlodipine to 5 mg daily and metoprolol to 25 mg BID on Friday (prior to she was taking Amlodipine 2.5 mg daily and metoprolol 12.5 mg BID). She takes her Losartan at night. This morning prior to Amlodipine and metoprolol at 8:30 am, BP was 188/72, HR 77. BP while on the phone (approx. 2 hours after morning meds) was 185/70, HR 65. She reports her chronic dizziness is worse than normal and has a slight headache that she notices closer to time to take her medicine. She feels like she has a "shakiness and nervousness" as well. Denies chest pain, SOB, palpitations, numbness or tingling, or weakness of one side of the body. Patient last saw Ignacia Bayley, NP on 09/20/16 and will route to him for advise. Meanwhile, patient advised to call 911 if new or worsening symptoms develop accompanied by increased BP and she verbalized understanding.

## 2016-10-09 NOTE — Telephone Encounter (Signed)
Pt calling stating she is having issues with her BP   States it was 200 over the weekend  She was away over the weekend  Has some concerns about this  Would like a call back    10/09/16 : 188/72

## 2016-10-09 NOTE — Telephone Encounter (Signed)
S/w patient and she verbalized undertstanding to increase amlodipine to 10 mg daily. She will go ahead and take an additional 5 mg now, then start taking 10mg  in the morning at one time. Patient said she had plenty of pills and requested I not send in a Rx at this time. Patient will continue to monitor BP and symptoms over the next day or so and then call us with update and to scheduled appt if needed.

## 2016-10-11 ENCOUNTER — Telehealth: Payer: Self-pay | Admitting: Cardiovascular Disease

## 2016-10-11 NOTE — Telephone Encounter (Signed)
Spoke with patient at length regarding her blood pressure readings and she reports that she increased her metoprolol back to 25 mg twice a day when they were elevated. She reports that over the weekend blood pressures remained elevated with systolic blood pressures in the 200's. She did report eating some high sodium foods and then she was traveling and she thought the higher altitude was a problem as well. Asked her if she was wearing compression hose and she denied this. She also did not have any specific readings available. Back on 09/20/16 she saw Ignacia Bayley NP and he decreased her metoprolol to 12.5 mg twice a day due to her dizziness. She states that she did not have any improvement on the lower dose and when her blood pressure increased she then resumed previous dosage of metoprolol 25 mg twice a day. Let her know that I would forward this for further recommendations and would then be in touch with her. She insisted that she needed to be seen next week with Dr. Rockey Situ. Made her aware that he may be booked but that we could certainly check. She demanded that she would be seen next week. Let her know that I would have someone call to see when we could get her in to be seen. She was appreciative for the call and had no further questions at this time.

## 2016-10-11 NOTE — Telephone Encounter (Signed)
Is it possible to have her see Thurmond Butts prior to 7/9?

## 2016-10-11 NOTE — Telephone Encounter (Signed)
Pt scheduled for 10/23/16 to see Dr Rockey Situ

## 2016-10-11 NOTE — Telephone Encounter (Signed)
Spoke with patient and reviewed recommendations with her. She reports that since her blood pressure has been elevated she has been taking Amlodipine 10 mg twice a day. Let her know that I would make them aware that she has been taking the higher dose and increased frequency. Advised her to decrease Metoprolol to 1/2 tablet twice a day due to slow heart rates. She verbalized understanding.

## 2016-10-11 NOTE — Telephone Encounter (Signed)
   blocker was reduced 2/2 intermittent sinus bradycardia.  If bp elevated, I would prefer that she increase amlodipine to 5 mg daily.  Prefer to reduce  blocker back to 1/2 tab bid.

## 2016-10-11 NOTE — Telephone Encounter (Signed)
Pt is calling with BP readings: 6/26-a.m. 165/61 HR 65, 3:30 pm 173/57, HR 69 6/27- a.m. 148/56, HR 59 Pt asks how to proceed with her medication. Please advise. Pt requests a call before 1:30 pm

## 2016-10-13 ENCOUNTER — Emergency Department: Payer: Medicare Other

## 2016-10-13 ENCOUNTER — Emergency Department
Admission: EM | Admit: 2016-10-13 | Discharge: 2016-10-13 | Disposition: A | Discharge status: Home / Self Care | Payer: Medicare Other | Attending: Emergency Medicine | Admitting: Emergency Medicine

## 2016-10-13 DIAGNOSIS — R42 Dizziness and giddiness: Secondary | ICD-10-CM | POA: Insufficient documentation

## 2016-10-13 DIAGNOSIS — I10 Essential (primary) hypertension: Secondary | ICD-10-CM | POA: Insufficient documentation

## 2016-10-13 DIAGNOSIS — Z87891 Personal history of nicotine dependence: Secondary | ICD-10-CM | POA: Diagnosis not present

## 2016-10-13 DIAGNOSIS — R531 Weakness: Secondary | ICD-10-CM | POA: Insufficient documentation

## 2016-10-13 DIAGNOSIS — Z7902 Long term (current) use of antithrombotics/antiplatelets: Secondary | ICD-10-CM | POA: Diagnosis not present

## 2016-10-13 DIAGNOSIS — Z79899 Other long term (current) drug therapy: Secondary | ICD-10-CM | POA: Diagnosis not present

## 2016-10-13 LAB — PROTIME-INR
INR: 0.91
Prothrombin Time: 12.2 seconds (ref 11.4–15.2)

## 2016-10-13 LAB — DIFFERENTIAL
BASOS ABS: 0 10*3/uL (ref 0–0.1)
BASOS PCT: 1 %
EOS ABS: 0.1 10*3/uL (ref 0–0.7)
Eosinophils Relative: 2 %
LYMPHS ABS: 1.7 10*3/uL (ref 1.0–3.6)
Lymphocytes Relative: 27 %
MONO ABS: 0.5 10*3/uL (ref 0.2–0.9)
MONOS PCT: 7 %
NEUTROS ABS: 4 10*3/uL (ref 1.4–6.5)
Neutrophils Relative %: 63 %

## 2016-10-13 LAB — COMPREHENSIVE METABOLIC PANEL
ALK PHOS: 61 U/L (ref 38–126)
ALT: 18 U/L (ref 14–54)
ANION GAP: 9 (ref 5–15)
AST: 25 U/L (ref 15–41)
Albumin: 4 g/dL (ref 3.5–5.0)
BILIRUBIN TOTAL: 0.8 mg/dL (ref 0.3–1.2)
BUN: 13 mg/dL (ref 6–20)
CALCIUM: 9.4 mg/dL (ref 8.9–10.3)
CO2: 25 mmol/L (ref 22–32)
CREATININE: 0.79 mg/dL (ref 0.44–1.00)
Chloride: 102 mmol/L (ref 101–111)
Glucose, Bld: 191 mg/dL — ABNORMAL HIGH (ref 65–99)
Potassium: 4.3 mmol/L (ref 3.5–5.1)
SODIUM: 136 mmol/L (ref 135–145)
TOTAL PROTEIN: 7.1 g/dL (ref 6.5–8.1)

## 2016-10-13 LAB — URINALYSIS, COMPLETE (UACMP) WITH MICROSCOPIC
BILIRUBIN URINE: NEGATIVE
Bacteria, UA: NONE SEEN
GLUCOSE, UA: NEGATIVE mg/dL
HGB URINE DIPSTICK: NEGATIVE
Ketones, ur: NEGATIVE mg/dL
Leukocytes, UA: NEGATIVE
NITRITE: NEGATIVE
PH: 6 (ref 5.0–8.0)
Protein, ur: NEGATIVE mg/dL
RBC / HPF: NONE SEEN RBC/hpf (ref 0–5)
SPECIFIC GRAVITY, URINE: 1.014 (ref 1.005–1.030)

## 2016-10-13 LAB — CBC
HCT: 46.9 % (ref 35.0–47.0)
HEMOGLOBIN: 15.9 g/dL (ref 12.0–16.0)
MCH: 28.6 pg (ref 26.0–34.0)
MCHC: 33.9 g/dL (ref 32.0–36.0)
MCV: 84.6 fL (ref 80.0–100.0)
PLATELETS: 199 10*3/uL (ref 150–440)
RBC: 5.55 MIL/uL — ABNORMAL HIGH (ref 3.80–5.20)
RDW: 13.7 % (ref 11.5–14.5)
WBC: 6.4 10*3/uL (ref 3.6–11.0)

## 2016-10-13 LAB — APTT: APTT: 34 s (ref 24–36)

## 2016-10-13 LAB — TROPONIN I: Troponin I: <0.03 ng/mL (ref <0.03)

## 2016-10-13 LAB — TSH: TSH: 3.282 u[IU]/mL (ref 0.350–4.500)

## 2016-10-13 MED ORDER — IOPAMIDOL (ISOVUE-370) INJECTION 76%
75.0000 mL | Freq: Once | INTRAVENOUS | Status: AC | PRN
  Administered 2016-10-13: 75 mL via INTRAVENOUS

## 2016-10-13 MED ORDER — SODIUM CHLORIDE 0.9 % IV BOLUS (SEPSIS)
500.0000 mL | Freq: Once | INTRAVENOUS | Status: AC
  Administered 2016-10-13: 500 mL via INTRAVENOUS

## 2016-10-13 NOTE — ED Triage Notes (Signed)
Patient c/o generalized weakness for 1 week. Hx of dizziness worsening over a week, patient states that she cannot walk without a walker due to her worsening dizziness. Patient c/o increasing blood pressures, however this is being managed by her PCP

## 2016-10-13 NOTE — ED Provider Notes (Signed)
[facility] Emergency Department Provider Note  ____________________________________________   First MD Initiated Contact with Patient 10/13/16 [phone]     (approximate)  I have reviewed the triage vital signs and the nursing notes.   HISTORY  Chief Complaint Weakness   HPI Claire Washington is a 83 y.o. female with a history of stroke on Plavix who is presenting to the emergency department with worsening weakness over the past several weeks as well as lightheadedness and dizziness.  She is denying any pain at this time. Denies any focal weakness. Denies any nausea vomiting or diarrhea. Denies any ringing in her ears. Has been worked up for similar issues over the past several weeks and is also trying to manage and erratic blood pressure. She says that her blood pressures ranged from the low 100s to over 200. She is been having her medications adjusted and an effort to help control her blood pressures. She denies any worsening of the dizziness with movement of her head. Says there is no spinning sensation at this time but more of a feeling of lightheadedness. She says she has seen her primary care doctor about this issue was thought that it is peripheral in nature. She has also seen ENT, underwent physical therapy and is working with neurology as well as cardiology.   Past Medical History:  Diagnosis Date  . Basal cell carcinoma    basel cell  . Chronic cystitis   . Embolic stroke ([place])    a. 07/2016 MRI Brain: small right superior cerebellum and left lateral occipital cortex infarcts;  b. 08/2016 Echo: EF 60-65%, Gr1 DD, mild MR;  c. 08/2016 30 Day Event Monitor: No significant tachy/bradyarrhythmias. No afib.  Marland Kitchen Hyperlipidemia   . Hypertension   . Leg pain   . Osteoarthritis   . Small vessel disease   . Subdural hematoma ([place])    a. 2013 in setting of syncope.  . Syncope and collapse    a. 2013 - syncope and fall w/ resultant subdural hematoma; b. prev nl  echo and stress test @ [facility].  Marland Kitchen Thyroid nodule   . Vertigo & Chronic Dizziness    a. Previously seen by ENT with Physical Therapy.    Patient Active Problem List   Diagnosis Date Noted  . Dizziness 11/03/2015  . Hypertension 12/04/2011  . Hyperlipidemia 12/04/2011  . Syncope 12/04/2011  . Diabetes mellitus ([place]) 12/04/2011  . Smoking hx 12/04/2011    Past Surgical History:  Procedure Laterality Date  . BILATERAL SALPINGOOPHORECTOMY    . COLONOSCOPY    . CYSTOSCOPY    . TOTAL ABDOMINAL HYSTERECTOMY      Prior to Admission medications   Medication Sig Start Date End Date Taking? Authorizing Provider  amLODipine (NORVASC) 2.5 MG tablet Take 2.5-5 mg by mouth daily.   Yes [provider]  clopidogrel (PLAVIX) 75 MG tablet Take 1 tablet (75 mg total) by mouth daily. Patient taking differently: Take 75 mg by mouth at bedtime.  06/30/16  Yes Minna Merritts, MD  losartan (COZAAR) 100 MG tablet Take 100 mg by mouth at bedtime.    Yes [provider]  meclizine (ANTIVERT) 12.5 MG tablet Take 1 tablet (12.5 mg total) by mouth 3 (three) times daily as needed for dizziness. 09/20/16  Yes Rogelia Mire, NP  metoprolol tartrate (LOPRESSOR) 25 MG tablet Take 0.5 tablets (12.5 mg total) by mouth 2 (two) times daily. Patient taking differently: Take 12.5-25 mg by mouth 2 (two)  times daily.  09/20/16 12/19/16 Yes Rogelia Mire, NP  Multiple Vitamins-Minerals (CENTRUM SILVER PO) Take 1 tablet by mouth daily. Reported on 06/11/2015   Yes [provider]  rosuvastatin (CRESTOR) 10 MG tablet TAKE 1 TABLET (10 MG TOTAL) BY MOUTH DAILY. Patient taking differently: Take 5 mg by mouth daily.  12/09/15  Yes Minna Merritts, MD  amLODipine (NORVASC) 10 MG tablet Take 1 tablet (10 mg total) by mouth daily. 10/09/16 01/07/17  Rogelia Mire, NP    Allergies Hydrocortisone and Sulfa antibiotics  Family History  Problem Relation Age of Onset  . Heart  disease Father     Social History Social History  Substance Use Topics  . Smoking status: Former Smoker    Packs/day: 0.50    Years: 25.00    Types: Cigarettes  . Smokeless tobacco: Never Used  . Alcohol use 0.0 oz/week    2 - 3 Glasses of wine per week     Comment: occassional    Review of Systems  Constitutional: No fever/chills Eyes: No visual changes. ENT: No sore throat. Cardiovascular: Denies chest pain. Respiratory: Denies shortness of breath. Gastrointestinal: No abdominal pain.  No nausea, no vomiting.  No diarrhea.  No constipation. Genitourinary: Negative for dysuria. Musculoskeletal: Negative for back pain. Skin: Negative for rash. Neurological: Negative for headaches, focal weakness or numbness.   ____________________________________________   PHYSICAL EXAM:  VITAL SIGNS: ED Triage Vitals  Enc Vitals Group     BP 10/13/16 0900 (!) 180/76     Pulse Rate 10/13/16 0900 96     Resp 10/13/16 0900 13     Temp --      Temp src --      SpO2 10/13/16 0900 93 %     Weight 10/13/16 0858 126 lb 12.2 oz (57.5 kg)     Height --      Head Circumference --      Peak Flow --      Pain Score --      Pain Loc --      Pain Edu? --      Excl. in [place]? --     Constitutional: Alert and oriented. Well appearing and in no acute distress. Eyes: Conjunctivae are normal. No nystagmus. Head: Atraumatic.Normal TMs bilaterally. Nose: No congestion/rhinnorhea. Mouth/Throat: Mucous membranes are moist.  Neck: No stridor.   Cardiovascular: Normal rate, regular rhythm. Grossly normal heart sounds.  Good peripheral circulation. Respiratory: Normal respiratory effort.  No retractions. Lungs CTAB. Gastrointestinal: Soft and nontender. No distention. No CVA tenderness. Musculoskeletal: No lower extremity tenderness nor edema.  No joint effusions. Neurologic:  Normal speech and language. No gross focal neurologic deficits are appreciated. 5 out of 5 strength throughout. No facial  asymmetry. No nystagmus. No ataxia on finger to nose testing. Skin:  Skin is warm, dry and intact. No rash noted. Psychiatric: Mood and affect are normal. Speech and behavior are normal.  ____________________________________________   LABS (all labs ordered are listed, but only abnormal results are displayed)  Labs Reviewed  CBC - Abnormal; Notable for the following:       Result Value   RBC 5.55 (*)    All other components within normal limits  COMPREHENSIVE METABOLIC PANEL - Abnormal; Notable for the following:    Glucose, Bld 191 (*)    All other components within normal limits  PROTIME-INR  APTT  TROPONIN I  DIFFERENTIAL  URINALYSIS, COMPLETE (UACMP) WITH MICROSCOPIC  TSH  CBG MONITORING, ED  ____________________________________________  EKG  ED ECG REPORT I, Doran Stabler, the attending physician, personally viewed and interpreted this ECG.   Date: 10/13/2016  EKG Time: 0902  Rate: 84  Rhythm: normal sinus rhythm  Axis: normal  Intervals:none  ST&T Change: No ST segment elevation or depression. No abnormal T-wave inversion.  ____________________________________________  RADIOLOGY  No acute finding on head CT.  CT angiography without any significant stenosis in the arteries of the neck. 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 ____________________________________________   PROCEDURES  Procedure(s) performed:   Procedures  Critical Care performed:   ____________________________________________   INITIAL IMPRESSION / ASSESSMENT AND PLAN / ED COURSE  Pertinent labs & imaging results that were available during my care of the patient were reviewed by me and considered in my medical decision making (see chart for details).  ----------------------------------------- 12:30 PM on 10/13/2016 -----------------------------------------  I discussed the case with Dr. Luciano Cutter woman who says that it is unlikely that the M2 occlusion is clinically  significant at this time although the patient is not having any right-sided weakness. I discussed the findings with the patient which are largely reassuring to rule out multiple acute processes at this time. She is frustrated as she has had worsening symptoms now for weeks to months. She says that she has an appointment with a neurologist coming up on July 20, Dr. Manuella Ghazi. She also says that she has a consultation with neurology at [facility] but not to until October. I offered to consult physical therapy as well as social work as the patient says that she has been increasingly weak lately. However, she says that she would rather go home. She is understanding with the plan and willing to comply. Her blood pressure was last 154 over 52 when I was in the room. Continues to be neurologically intact without any gross deficits at this time.      ____________________________________________   FINAL CLINICAL IMPRESSION(S) / ED DIAGNOSES  Dizziness. Lightheadedness. Weakness.    NEW MEDICATIONS STARTED DURING THIS VISIT:  New Prescriptions   No medications on file     Note:  This document was prepared using Dragon voice recognition software and may include unintentional dictation errors.     Orbie Pyo, MD 10/13/16 [phone]

## 2016-10-19 ENCOUNTER — Emergency Department: Payer: Medicare Other

## 2016-10-19 ENCOUNTER — Observation Stay
Admission: EM | Admit: 2016-10-19 | Discharge: 2016-10-23 | Disposition: A | Discharge status: Skilled Nursing Facility | Payer: Medicare Other | Attending: Internal Medicine | Admitting: Internal Medicine

## 2016-10-19 DIAGNOSIS — M1990 Unspecified osteoarthritis, unspecified site: Secondary | ICD-10-CM | POA: Insufficient documentation

## 2016-10-19 DIAGNOSIS — R270 Ataxia, unspecified: Secondary | ICD-10-CM | POA: Diagnosis not present

## 2016-10-19 DIAGNOSIS — E785 Hyperlipidemia, unspecified: Secondary | ICD-10-CM | POA: Insufficient documentation

## 2016-10-19 DIAGNOSIS — Z7902 Long term (current) use of antithrombotics/antiplatelets: Secondary | ICD-10-CM | POA: Insufficient documentation

## 2016-10-19 DIAGNOSIS — E1151 Type 2 diabetes mellitus with diabetic peripheral angiopathy without gangrene: Secondary | ICD-10-CM | POA: Diagnosis not present

## 2016-10-19 DIAGNOSIS — Z8249 Family history of ischemic heart disease and other diseases of the circulatory system: Secondary | ICD-10-CM | POA: Insufficient documentation

## 2016-10-19 DIAGNOSIS — I672 Cerebral atherosclerosis: Secondary | ICD-10-CM | POA: Insufficient documentation

## 2016-10-19 DIAGNOSIS — R42 Dizziness and giddiness: Secondary | ICD-10-CM

## 2016-10-19 DIAGNOSIS — W19XXXA Unspecified fall, initial encounter: Secondary | ICD-10-CM

## 2016-10-19 DIAGNOSIS — Y92009 Unspecified place in unspecified non-institutional (private) residence as the place of occurrence of the external cause: Secondary | ICD-10-CM | POA: Diagnosis not present

## 2016-10-19 DIAGNOSIS — Z7982 Long term (current) use of aspirin: Secondary | ICD-10-CM | POA: Diagnosis not present

## 2016-10-19 DIAGNOSIS — R911 Solitary pulmonary nodule: Secondary | ICD-10-CM | POA: Insufficient documentation

## 2016-10-19 DIAGNOSIS — Z87891 Personal history of nicotine dependence: Secondary | ICD-10-CM | POA: Insufficient documentation

## 2016-10-19 DIAGNOSIS — Z85828 Personal history of other malignant neoplasm of skin: Secondary | ICD-10-CM | POA: Insufficient documentation

## 2016-10-19 DIAGNOSIS — Z8673 Personal history of transient ischemic attack (TIA), and cerebral infarction without residual deficits: Secondary | ICD-10-CM | POA: Diagnosis not present

## 2016-10-19 DIAGNOSIS — R2681 Unsteadiness on feet: Secondary | ICD-10-CM | POA: Diagnosis present

## 2016-10-19 DIAGNOSIS — Z90710 Acquired absence of both cervix and uterus: Secondary | ICD-10-CM | POA: Diagnosis not present

## 2016-10-19 DIAGNOSIS — Z79899 Other long term (current) drug therapy: Secondary | ICD-10-CM | POA: Diagnosis not present

## 2016-10-19 DIAGNOSIS — I10 Essential (primary) hypertension: Secondary | ICD-10-CM | POA: Diagnosis not present

## 2016-10-19 DIAGNOSIS — Z882 Allergy status to sulfonamides status: Secondary | ICD-10-CM | POA: Diagnosis not present

## 2016-10-19 DIAGNOSIS — R4701 Aphasia: Secondary | ICD-10-CM

## 2016-10-19 DIAGNOSIS — R4781 Slurred speech: Secondary | ICD-10-CM | POA: Insufficient documentation

## 2016-10-19 DIAGNOSIS — R296 Repeated falls: Secondary | ICD-10-CM | POA: Insufficient documentation

## 2016-10-19 DIAGNOSIS — Z888 Allergy status to other drugs, medicaments and biological substances status: Secondary | ICD-10-CM | POA: Insufficient documentation

## 2016-10-19 DIAGNOSIS — W010XXA Fall on same level from slipping, tripping and stumbling without subsequent striking against object, initial encounter: Secondary | ICD-10-CM | POA: Insufficient documentation

## 2016-10-19 DIAGNOSIS — M25552 Pain in left hip: Secondary | ICD-10-CM | POA: Diagnosis not present

## 2016-10-19 DIAGNOSIS — I6523 Occlusion and stenosis of bilateral carotid arteries: Secondary | ICD-10-CM | POA: Diagnosis not present

## 2016-10-19 LAB — URINALYSIS, COMPLETE (UACMP) WITH MICROSCOPIC
BACTERIA UA: NONE SEEN
BILIRUBIN URINE: NEGATIVE
Glucose, UA: NEGATIVE mg/dL
Hgb urine dipstick: NEGATIVE
KETONES UR: NEGATIVE mg/dL
NITRITE: NEGATIVE
PROTEIN: NEGATIVE mg/dL
RBC / HPF: NONE SEEN RBC/hpf (ref 0–5)
SQUAMOUS EPITHELIAL / LPF: NONE SEEN
Specific Gravity, Urine: 1.005 (ref 1.005–1.030)
pH: 6 (ref 5.0–8.0)

## 2016-10-19 LAB — COMPREHENSIVE METABOLIC PANEL
ALBUMIN: 3.8 g/dL (ref 3.5–5.0)
ALK PHOS: 57 U/L (ref 38–126)
ALT: 14 U/L (ref 14–54)
ANION GAP: 8 (ref 5–15)
AST: 17 U/L (ref 15–41)
BUN: 11 mg/dL (ref 6–20)
CALCIUM: 9.3 mg/dL (ref 8.9–10.3)
CHLORIDE: 97 mmol/L — AB (ref 101–111)
CO2: 27 mmol/L (ref 22–32)
Creatinine, Ser: 0.53 mg/dL (ref 0.44–1.00)
GFR calc non Af Amer: >60 mL/min (ref >60)
GLUCOSE: 159 mg/dL — AB (ref 65–99)
Potassium: 4.9 mmol/L (ref 3.5–5.1)
SODIUM: 132 mmol/L — AB (ref 135–145)
Total Bilirubin: 0.6 mg/dL (ref 0.3–1.2)
Total Protein: 6.7 g/dL (ref 6.5–8.1)

## 2016-10-19 LAB — CBC WITH DIFFERENTIAL/PLATELET
BASOS PCT: 1 %
Basophils Absolute: 0.1 10*3/uL (ref 0–0.1)
EOS ABS: 0.1 10*3/uL (ref 0–0.7)
EOS PCT: 1 %
HCT: 44.3 % (ref 35.0–47.0)
HEMOGLOBIN: 14.9 g/dL (ref 12.0–16.0)
Lymphocytes Relative: 14 %
Lymphs Abs: 1.2 10*3/uL (ref 1.0–3.6)
MCH: 28.6 pg (ref 26.0–34.0)
MCHC: 33.7 g/dL (ref 32.0–36.0)
MCV: 84.9 fL (ref 80.0–100.0)
MONOS PCT: 6 %
Monocytes Absolute: 0.5 10*3/uL (ref 0.2–0.9)
NEUTROS PCT: 78 %
Neutro Abs: 7 10*3/uL — ABNORMAL HIGH (ref 1.4–6.5)
PLATELETS: 193 10*3/uL (ref 150–440)
RBC: 5.22 MIL/uL — AB (ref 3.80–5.20)
RDW: 13.7 % (ref 11.5–14.5)
WBC: 8.9 10*3/uL (ref 3.6–11.0)

## 2016-10-19 MED ORDER — ROSUVASTATIN CALCIUM 10 MG PO TABS
5.0000 mg | ORAL_TABLET | Freq: Every day | ORAL | Status: DC
  Administered 2016-10-20 – 2016-10-23 (×4): 5 mg via ORAL
  Filled 2016-10-19 (×4): qty 1

## 2016-10-19 MED ORDER — MECLIZINE HCL 12.5 MG PO TABS
12.5000 mg | ORAL_TABLET | Freq: Three times a day (TID) | ORAL | Status: DC | PRN
  Administered 2016-10-22: 12.5 mg via ORAL
  Filled 2016-10-19 (×2): qty 1

## 2016-10-19 MED ORDER — ENOXAPARIN SODIUM 40 MG/0.4ML ~~LOC~~ SOLN
40.0000 mg | SUBCUTANEOUS | Status: DC
  Administered 2016-10-19 – 2016-10-22 (×4): 40 mg via SUBCUTANEOUS
  Filled 2016-10-19 (×4): qty 0.4

## 2016-10-19 MED ORDER — AMLODIPINE BESYLATE 10 MG PO TABS
10.0000 mg | ORAL_TABLET | Freq: Every day | ORAL | Status: DC
  Administered 2016-10-20 – 2016-10-23 (×4): 10 mg via ORAL
  Filled 2016-10-19 (×4): qty 1

## 2016-10-19 MED ORDER — BISACODYL 5 MG PO TBEC
5.0000 mg | DELAYED_RELEASE_TABLET | Freq: Every day | ORAL | Status: DC | PRN
  Administered 2016-10-22: 5 mg via ORAL
  Filled 2016-10-19 (×2): qty 1

## 2016-10-19 MED ORDER — ACETAMINOPHEN 650 MG RE SUPP: 650.0000 mg | Freq: Four times a day (QID) | RECTAL | Status: DC | PRN

## 2016-10-19 MED ORDER — CLOPIDOGREL BISULFATE 75 MG PO TABS
75.0000 mg | ORAL_TABLET | Freq: Every day | ORAL | Status: DC
  Administered 2016-10-19 – 2016-10-22 (×4): 75 mg via ORAL
  Filled 2016-10-19 (×4): qty 1

## 2016-10-19 MED ORDER — IPRATROPIUM BROMIDE 0.02 % IN SOLN: 0.5000 mg | Freq: Four times a day (QID) | RESPIRATORY_TRACT | Status: DC | PRN

## 2016-10-19 MED ORDER — LOSARTAN POTASSIUM 50 MG PO TABS
100.0000 mg | ORAL_TABLET | Freq: Every day | ORAL | Status: DC
  Administered 2016-10-19 – 2016-10-22 (×4): 100 mg via ORAL
  Filled 2016-10-19 (×4): qty 2

## 2016-10-19 MED ORDER — ONDANSETRON HCL 4 MG PO TABS: 4.0000 mg | ORAL_TABLET | Freq: Four times a day (QID) | ORAL | Status: DC | PRN

## 2016-10-19 MED ORDER — ONDANSETRON HCL 4 MG/2ML IJ SOLN: 4.0000 mg | Freq: Four times a day (QID) | INTRAMUSCULAR | Status: DC | PRN

## 2016-10-19 MED ORDER — METOPROLOL TARTRATE 25 MG PO TABS
12.5000 mg | ORAL_TABLET | Freq: Two times a day (BID) | ORAL | Status: DC
  Administered 2016-10-19 – 2016-10-23 (×8): 12.5 mg via ORAL
  Filled 2016-10-19 (×8): qty 1

## 2016-10-19 MED ORDER — TRAZODONE HCL 50 MG PO TABS
50.0000 mg | ORAL_TABLET | Freq: Every day | ORAL | Status: DC
  Administered 2016-10-19 – 2016-10-22 (×4): 50 mg via ORAL
  Filled 2016-10-19 (×4): qty 1

## 2016-10-19 MED ORDER — ACETAMINOPHEN 325 MG PO TABS
650.0000 mg | ORAL_TABLET | Freq: Four times a day (QID) | ORAL | Status: DC | PRN
  Administered 2016-10-20: 20:00:00 650 mg via ORAL
  Filled 2016-10-19 (×2): qty 2

## 2016-10-19 MED ORDER — SENNOSIDES-DOCUSATE SODIUM 8.6-50 MG PO TABS
1.0000 | ORAL_TABLET | Freq: Every evening | ORAL | Status: DC | PRN
  Administered 2016-10-20 – 2016-10-21 (×2): 1 via ORAL
  Filled 2016-10-19 (×2): qty 1

## 2016-10-19 MED ORDER — MAGNESIUM CITRATE PO SOLN
1.0000 | Freq: Once | ORAL | Status: DC | PRN
  Filled 2016-10-19: qty 296

## 2016-10-19 MED ORDER — SODIUM CHLORIDE 0.9 % IV SOLN
INTRAVENOUS | Status: DC
  Administered 2016-10-19: via INTRAVENOUS

## 2016-10-19 MED ORDER — ALBUTEROL SULFATE (2.5 MG/3ML) 0.083% IN NEBU: 2.5000 mg | INHALATION_SOLUTION | Freq: Four times a day (QID) | RESPIRATORY_TRACT | Status: DC | PRN

## 2016-10-19 NOTE — ED Provider Notes (Signed)
[facility] Emergency Department Provider Note       Time seen: ----------------------------------------- 6:12 PM on 10/19/2016 -----------------------------------------     I have reviewed the triage vital signs and the nursing notes.   HISTORY   Chief Complaint No chief complaint on file.    HPI Claire Washington is a 83 y.o. female who presents to the ED for left hip pain after a fall. Patient states she's been having trouble with her blood pressure and was recently seen in the ER for weakness. She denies fevers, chills, chest pain, shortness of breath, vomiting or diarrhea. Patient was able to walk with a walker but is having significant pain whenever she walks and left hip. The fall occurred today.   Past Medical History:  Diagnosis Date  . Basal cell carcinoma    basel cell  . Chronic cystitis   . Embolic stroke ([place])    a. 07/2016 MRI Brain: small right superior cerebellum and left lateral occipital cortex infarcts;  b. 08/2016 Echo: EF 60-65%, Gr1 DD, mild MR;  c. 08/2016 30 Day Event Monitor: No significant tachy/bradyarrhythmias. No afib.  Marland Kitchen Hyperlipidemia   . Hypertension   . Leg pain   . Osteoarthritis   . Small vessel disease   . Subdural hematoma ([place])    a. 2013 in setting of syncope.  . Syncope and collapse    a. 2013 - syncope and fall w/ resultant subdural hematoma; b. prev nl echo and stress test @ [facility].  Marland Kitchen Thyroid nodule   . Vertigo & Chronic Dizziness    a. Previously seen by ENT with Physical Therapy.    Patient Active Problem List   Diagnosis Date Noted  . Dizziness 11/03/2015  . Hypertension 12/04/2011  . Hyperlipidemia 12/04/2011  . Syncope 12/04/2011  . Diabetes mellitus ([place]) 12/04/2011  . Smoking hx 12/04/2011    Past Surgical History:  Procedure Laterality Date  . BILATERAL SALPINGOOPHORECTOMY    . COLONOSCOPY    . CYSTOSCOPY    . TOTAL ABDOMINAL HYSTERECTOMY      Allergies Hydrocortisone  and Sulfa antibiotics  Social History Social History  Substance Use Topics  . Smoking status: Former Smoker    Packs/day: 0.50    Years: 25.00    Types: Cigarettes  . Smokeless tobacco: Never Used  . Alcohol use 0.0 oz/week    2 - 3 Glasses of wine per week     Comment: occassional    Review of Systems Constitutional: Negative for fever. Cardiovascular: Negative for chest pain. Respiratory: Negative for shortness of breath. Gastrointestinal: Negative for abdominal pain, vomiting and diarrhea. Genitourinary: Negative for dysuria. Musculoskeletal: Positive for left hip pain Skin: Negative for rash. Neurological: Negative for headaches, focal weakness or numbness.  All systems negative/normal/unremarkable except as stated in the HPI  ____________________________________________   PHYSICAL EXAM:  VITAL SIGNS: ED Triage Vitals  Enc Vitals Group     BP      Pulse      Resp      Temp      Temp src      SpO2      Weight      Height      Head Circumference      Peak Flow      Pain Score      Pain Loc      Pain Edu?      Excl. in [place]?     Constitutional: Alert and oriented. Well appearing and  in no distress. Eyes: Conjunctivae are normal. Normal extraocular movements. ENT   Head: Normocephalic and atraumatic.   Nose: No congestion/rhinnorhea.   Mouth/Throat: Mucous membranes are moist.   Neck: No stridor. Cardiovascular: Normal rate, regular rhythm. No murmurs, rubs, or gallops. Respiratory: Normal respiratory effort without tachypnea nor retractions. Breath sounds are clear and equal bilaterally. No wheezes/rales/rhonchi. Gastrointestinal: Soft and nontender. Normal bowel sounds Musculoskeletal: Severe pain with range motion of left hip, left hip is not shortened or rotated. Distal pulses. Neurologic:  Normal speech and language. Severe ataxia, positive Romberg sign, strength and sensation appear to be normal. Skin:  Skin is warm, dry and intact. No  rash noted. Psychiatric: Mood and affect are normal. Speech and behavior are normal.  ____________________________________________  EKG: Interpreted by me. Sinus rhythm with a rate of 70 bpm, normal PR, normal QRS, normal QT.  ____________________________________________  ED COURSE:  Pertinent labs & imaging results that were available during my care of the patient were reviewed by me and considered in my medical decision making (see chart for details). Patient presents for weakness, we will assess with labs and imaging as indicated. Clinical Course as of Oct 19 2048  Thu Oct 19, 2016  2001 Patient is severely ataxic and cannot take a single step without falling.  [JW]    Clinical Course User Index [JW] Earleen Newport, MD   Procedures ____________________________________________   LABS (pertinent positives/negatives)  Labs Reviewed  CBC WITH DIFFERENTIAL/PLATELET - Abnormal; Notable for the following:       Result Value   RBC 5.22 (*)    Neutro Abs 7.0 (*)    All other components within normal limits  COMPREHENSIVE METABOLIC PANEL - Abnormal; Notable for the following:    Sodium 132 (*)    Chloride 97 (*)    Glucose, Bld 159 (*)    All other components within normal limits  URINALYSIS, COMPLETE (UACMP) WITH MICROSCOPIC  CBG MONITORING, ED    RADIOLOGY Images were viewed by me  Left hip x-rays  IMPRESSION: No acute bony abnormality. CT head IMPRESSION: No acute intracranial abnormality. Chronic white matter changes. ____________________________________________  FINAL ASSESSMENT AND PLAN  Fall, weakness, ataxia  Plan: Patient's labs and imaging were dictated above. Patient had presented for a fall at home due to the dizziness that she has been struggling with. Patient has severe ataxia on examination and can't take any steps safely. She is currently in queue to see a neurologist and she would benefit from hospital observation, PT evaluation and perhaps  neurology consultation.   Earleen Newport, MD   Note: This note was generated in part or whole with voice recognition software. Voice recognition is usually quite accurate but there are transcription errors that can and very often do occur. I apologize for any typographical errors that were not detected and corrected.     Earleen Newport, MD 10/19/16 2104

## 2016-10-19 NOTE — H&P (Signed)
History and Physical   [facility] [place] @ [facility] Admission History and Physical [employer], D.O.    Patient Name: Claire Washington MR#: [id number] Date of Birth: 27-Dec-1924 Date of Admission: 10/19/2016  Referring MD/NP/PA: Dr. Jimmye Norman Primary Care Physician: Tracie Harrier, MD  Chief Complaint:  Chief Complaint  Patient presents with  . Fall  . Hip Pain    HPI: Claire Washington is a 83 y.o. female with a known history of CVA, hypertension, hyperlipidemia, subdural hematoma, syncope, chronic vertigo, orthostatic hypotension presents to the emergency department for evaluation of hip pain following a fall secondary to dizziness..  Patient has chronic dizziness at baseline however she reports progressively worsening symptoms that have led to gait instability. She also complains of left hip pain following a fall this afternoon and has since been unable to ambulate secondary to pain.   Patient denies fevers/chills, chest pain, shortness of breath, N/V/C/D, abdominal pain, dysuria/frequency, changes in mental status.    Of note she was seen in this emergency department for weakness one week ago. She does have an appointment to see a neurologist this month for her ongoing dizziness with gait instability. Otherwise there has been no change in status. Patient has been taking medication as prescribed and there has been no recent change in medication or diet.  No recent antibiotics.  There has been no recent illness, hospitalizations, travel or sick contacts.    EMS/ED Course: Medical admission was requested for further workup of intractable dizziness, gait instability, left hip pain.  Review of Systems:  CONSTITUTIONAL: Positive weakness, dizziness No fever/chills, fatigue, weight gain/loss, headache. EYES: No blurry or double vision. ENT: No tinnitus, postnasal drip, redness or soreness of the oropharynx. RESPIRATORY: No cough, dyspnea, wheeze.  No hemoptysis.  CARDIOVASCULAR: No  chest pain, palpitations, syncope, orthopnea. No lower extremity edema.  GASTROINTESTINAL: No nausea, vomiting, abdominal pain, diarrhea, constipation.  No hematemesis, melena or hematochezia. GENITOURINARY: No dysuria, frequency, hematuria. ENDOCRINE: No polyuria or nocturia. No heat or cold intolerance. HEMATOLOGY: No anemia, bruising, bleeding. INTEGUMENTARY: No rashes, ulcers, lesions. MUSCULOSKELETAL: No arthritis, gout, dyspnea. NEUROLOGIC: Positive weakness, dizziness, gait instability. No numbness, tingling, ataxia, seizure-type activity. PSYCHIATRIC: No anxiety, depression, insomnia.   Past Medical History:  Diagnosis Date  . Basal cell carcinoma    basel cell  . Chronic cystitis   . Embolic stroke ([place])    a. 07/2016 MRI Brain: small right superior cerebellum and left lateral occipital cortex infarcts;  b. 08/2016 Echo: EF 60-65%, Gr1 DD, mild MR;  c. 08/2016 30 Day Event Monitor: No significant tachy/bradyarrhythmias. No afib.  Marland Kitchen Hyperlipidemia   . Hypertension   . Leg pain   . Osteoarthritis   . Small vessel disease   . Subdural hematoma ([place])    a. 2013 in setting of syncope.  . Syncope and collapse    a. 2013 - syncope and fall w/ resultant subdural hematoma; b. prev nl echo and stress test @ [facility].  Marland Kitchen Thyroid nodule   . Vertigo & Chronic Dizziness    a. Previously seen by ENT with Physical Therapy.    Past Surgical History:  Procedure Laterality Date  . BILATERAL SALPINGOOPHORECTOMY    . COLONOSCOPY    . CYSTOSCOPY    . TOTAL ABDOMINAL HYSTERECTOMY       reports that she has quit smoking. Her smoking use included Cigarettes. She has a 12.50 pack-year smoking history. She has never used smokeless tobacco. She reports that she drinks alcohol. She reports  that she does not use drugs.  Allergies  Allergen Reactions  . Hydrocortisone Swelling  . Sulfa Antibiotics     Family History   Medical History Relation Name Comments  Myocardial Infarction  (Heart attack) Brother    No Known Problems Daughter    Asthma Daughter    Myocardial Infarction (Heart attack) Father    Prostate cancer Father    Stroke Father    Colon cancer Mother    Diabetes Other  Siblings     Prior to Admission medications   Medication Sig Start Date End Date Taking? Authorizing Provider  amLODipine (NORVASC) 10 MG tablet Take 1 tablet (10 mg total) by mouth daily. 10/09/16 01/07/17  Rogelia Mire, NP  amLODipine (NORVASC) 2.5 MG tablet Take 2.5-5 mg by mouth daily.    [provider]  clopidogrel (PLAVIX) 75 MG tablet Take 1 tablet (75 mg total) by mouth daily. Patient taking differently: Take 75 mg by mouth at bedtime.  06/30/16   Minna Merritts, MD  losartan (COZAAR) 100 MG tablet Take 100 mg by mouth at bedtime.     [provider]  meclizine (ANTIVERT) 12.5 MG tablet Take 1 tablet (12.5 mg total) by mouth 3 (three) times daily as needed for dizziness. 09/20/16   Rogelia Mire, NP  metoprolol tartrate (LOPRESSOR) 25 MG tablet Take 0.5 tablets (12.5 mg total) by mouth 2 (two) times daily. Patient taking differently: Take 12.5-25 mg by mouth 2 (two) times daily.  09/20/16 12/19/16  Rogelia Mire, NP  Multiple Vitamins-Minerals (CENTRUM SILVER PO) Take 1 tablet by mouth daily. Reported on 06/11/2015    [provider]  rosuvastatin (CRESTOR) 10 MG tablet TAKE 1 TABLET (10 MG TOTAL) BY MOUTH DAILY. Patient taking differently: Take 5 mg by mouth daily.  12/09/15   Minna Merritts, MD    Physical Exam: Vitals:   10/19/16 1930 10/19/16 2045 10/19/16 2100 10/19/16 2130  BP: (!) 130/50 (!) 144/53 (!) 147/56 (!) 141/55  Pulse: 79 87 79 73  Resp:    (!) 22  Temp:      TempSrc:      SpO2: 95% 95% 94% 94%  Weight:      Height:        GENERAL: 83 y.o.-year-old Elderly female patient, well-developed, well-nourished lying in the bed in no acute distress.  Pleasant and cooperative.   HEENT: Head atraumatic,  normocephalic. Pupils equal, round, reactive to light and accommodation. No scleral icterus. Extraocular muscles intact. Nares are patent. Oropharynx is clear. Mucus membranes moist. NECK: Supple, full range of motion. No JVD, no bruit heard. No thyroid enlargement, no tenderness, no cervical lymphadenopathy. CHEST: Normal breath sounds bilaterally. No wheezing, rales, rhonchi or crackles. No use of accessory muscles of respiration.  No reproducible chest wall tenderness.  CARDIOVASCULAR: S1, S2 normal. No murmurs, rubs, or gallops. Cap refill <2 seconds. Pulses intact distally.  ABDOMEN: Soft, nondistended, nontender. No rebound, guarding, rigidity. Normoactive bowel sounds present in all four quadrants. No organomegaly or mass. EXTREMITIES: No pedal edema, cyanosis, or clubbing. No calf tenderness or Homan's sign.  NEUROLOGIC: The patient is alert and oriented x 3. Cranial nerves II through XII are grossly intact with no focal sensorimotor deficit. Muscle strength 5/5 in all extremities. Sensation intact. Gait not checked. Per emergency department physician exam consistent with severe ataxia. PSYCHIATRIC:  Normal affect, mood, thought content. SKIN: Warm, dry, and intact without obvious rash, lesion, or ulcer.    Labs on Admission:  CBC:  Recent Labs Lab 10/13/16 0858 10/19/16 1821  WBC 6.4 8.9  NEUTROABS 4.0 7.0*  HGB 15.9 14.9  HCT 46.9 44.3  MCV 84.6 84.9  PLT 199 035   Basic Metabolic Panel:  Recent Labs Lab 10/13/16 0858 10/19/16 1821  NA 136 132*  K 4.3 4.9  CL 102 97*  CO2 25 27  GLUCOSE 191* 159*  BUN 13 11  CREATININE 0.79 0.53  CALCIUM 9.4 9.3   GFR: Estimated Creatinine Clearance: 36 mL/min (by C-G formula based on SCr of 0.53 mg/dL). Liver Function Tests:  Recent Labs Lab 10/13/16 0858 10/19/16 1821  AST 25 17  ALT 18 14  ALKPHOS 61 57  BILITOT 0.8 0.6  PROT 7.1 6.7  ALBUMIN 4.0 3.8   No results for input(s): LIPASE, AMYLASE in the last 168  hours. No results for input(s): AMMONIA in the last 168 hours. Coagulation Profile:  Recent Labs Lab 10/13/16 0905  INR 0.91   Cardiac Enzymes:  Recent Labs Lab 10/13/16 0905  TROPONINI <0.03   BNP (last 3 results) No results for input(s): PROBNP in the last 8760 hours. HbA1C: No results for input(s): HGBA1C in the last 72 hours. CBG: No results for input(s): GLUCAP in the last 168 hours. Lipid Profile: No results for input(s): CHOL, HDL, LDLCALC, TRIG, CHOLHDL, LDLDIRECT in the last 72 hours. Thyroid Function Tests: No results for input(s): TSH, T4TOTAL, FREET4, T3FREE, THYROIDAB in the last 72 hours. Anemia Panel: No results for input(s): VITAMINB12, FOLATE, FERRITIN, TIBC, IRON, RETICCTPCT in the last 72 hours. Urine analysis:    Component Value Date/Time   COLORURINE YELLOW (A) 10/19/2016 2051   APPEARANCEUR CLEAR (A) 10/19/2016 2051   LABSPEC 1.005 10/19/2016 2051   PHURINE 6.0 10/19/2016 2051   [place] NEGATIVE 10/19/2016 2051   HGBUR NEGATIVE 10/19/2016 2051   [place] NEGATIVE 10/19/2016 2051   [place] NEGATIVE 10/19/2016 2051   PROTEINUR NEGATIVE 10/19/2016 2051   NITRITE NEGATIVE 10/19/2016 2051   LEUKOCYTESUR TRACE (A) 10/19/2016 2051   Sepsis Labs: @LABRCNTIP (procalcitonin:4,lacticidven:4) )No results found for this or any previous visit (from the past 240 hour(s)).   Radiological Exams on Admission: Ct Head Wo Contrast  Result Date: 10/19/2016 CLINICAL DATA:  Fall with pain. EXAM: CT HEAD WITHOUT CONTRAST TECHNIQUE: Contiguous axial images were obtained from the base of the skull through the vertex without intravenous contrast. COMPARISON:  October 13, 2016 FINDINGS: Brain: No subdural, epidural, or subarachnoid hemorrhages identified. Cerebellum, brainstem, and basal cisterns are normal. Ventricles and sulci are unchanged. Scattered white matter changes again identified. No acute cortical ischemia or infarct. No mass effect or midline shift. Vascular:  Calcified atherosclerosis is seen in the intracranial carotid arteries. Skull: Normal. Negative for fracture or focal lesion. Sinuses/Orbits: No acute finding. Other: None. IMPRESSION: No acute intracranial abnormality.  Chronic white matter changes. Electronically Signed   By: Dorise Bullion III M.D   On: 10/19/2016 20:20   Dg Hip Unilat W Or W/o Pelvis 2-3 Views Left  Result Date: 10/19/2016 CLINICAL DATA:  Left hip pain after fall. EXAM: DG HIP (WITH OR WITHOUT PELVIS) 2-3V LEFT COMPARISON:  No comparison studies available. FINDINGS: Bones are demineralized. No evidence for fracture. No dislocation. SI joints and symphysis pubis are unremarkable. IMPRESSION: No acute bony abnormality. Electronically Signed   By: Misty Stanley M.D.   On: 10/19/2016 19:07    EKG: Normal sinus rhythm at 70 bpm with normal axis and nonspecific ST-T wave changes.   Assessment/Plan  This is a  83 y.o. female with a history of CVA, hypertension, hyperlipidemia, subdural hematoma, syncope, chronic vertig now being admitted with:  #. Gait instability secondary to acute on chronic intractable dizziness  -Admit to observation, telemetry -Fall precautions, up with assistance -Check TSH, B12, ferritin -Check orthostatics and carotid doppler -Continue meclizine -Neurology and physical therapy consults have been requested  #. Left hip pain s/p fall - Pain control - PT eval in am.  #. History of hypertension -Continue Norvasc, metoprolol, Cozaar  #. History of hyperlipidemia -Continue Crestor  #. History of CVA - Continue Plavix  Admission status: Observation, telemetry IV Fluids: Normal saline Diet/Nutrition: Heart healthy Consults called: Neurology, physical therapy  DVT Px: Lovenox, SCDs and early ambulation. Code Status: Full Code  Disposition Plan: To be determined  All the records are reviewed and case discussed with ED provider. Management plans discussed with the patient and/or family who express  understanding and agree with plan of care.  Jaisa Defino D.O. on 10/19/2016 at 9:46 PM Between 7am to 6pm - Pager - [phone] After 6pm go to [email or web address] - [employer] [facility] Office [phone] CC: Primary care physician; Tracie Harrier, MD   10/19/2016, 9:46 PM

## 2016-10-19 NOTE — ED Triage Notes (Signed)
Pt arrived via ems for c/o left hip pain after a fall today - pt reports that when she is not moving she is a 0/10 pain with any movement she is a 10/10 pain - pt was able to stand and ambulate with a walker to get to phone and call ems - ems stated with assistance they were able to ambulate her to the ambulance - pt also reports "problems with her blood pressure"

## 2016-10-19 NOTE — ED Notes (Signed)
Pt unable to ambulate with two person assist

## 2016-10-19 NOTE — ED Notes (Signed)
Pt to notify nurse when she has to void

## 2016-10-20 ENCOUNTER — Observation Stay: Payer: Medicare Other

## 2016-10-20 DIAGNOSIS — R42 Dizziness and giddiness: Secondary | ICD-10-CM

## 2016-10-20 DIAGNOSIS — M25552 Pain in left hip: Secondary | ICD-10-CM | POA: Diagnosis not present

## 2016-10-20 DIAGNOSIS — R270 Ataxia, unspecified: Secondary | ICD-10-CM | POA: Diagnosis not present

## 2016-10-20 LAB — CBC
HCT: 41 % (ref 35.0–47.0)
Hemoglobin: 14 g/dL (ref 12.0–16.0)
MCH: 28.9 pg (ref 26.0–34.0)
MCHC: 34.1 g/dL (ref 32.0–36.0)
MCV: 84.7 fL (ref 80.0–100.0)
PLATELETS: 172 10*3/uL (ref 150–440)
RBC: 4.85 MIL/uL (ref 3.80–5.20)
RDW: 13.6 % (ref 11.5–14.5)
WBC: 6.3 10*3/uL (ref 3.6–11.0)

## 2016-10-20 LAB — MAGNESIUM: Magnesium: 1.8 mg/dL (ref 1.7–2.4)

## 2016-10-20 LAB — BASIC METABOLIC PANEL
Anion gap: 5 (ref 5–15)
BUN: 10 mg/dL (ref 6–20)
CHLORIDE: 104 mmol/L (ref 101–111)
CO2: 27 mmol/L (ref 22–32)
CREATININE: 0.56 mg/dL (ref 0.44–1.00)
Calcium: 8.9 mg/dL (ref 8.9–10.3)
GFR calc Af Amer: >60 mL/min (ref >60)
GFR calc non Af Amer: >60 mL/min (ref >60)
Glucose, Bld: 126 mg/dL — ABNORMAL HIGH (ref 65–99)
Potassium: 3.9 mmol/L (ref 3.5–5.1)
SODIUM: 136 mmol/L (ref 135–145)

## 2016-10-20 LAB — TSH: TSH: 4.261 u[IU]/mL (ref 0.350–4.500)

## 2016-10-20 LAB — PHOSPHORUS: PHOSPHORUS: 3.1 mg/dL (ref 2.5–4.6)

## 2016-10-20 LAB — FERRITIN: Ferritin: 143 ng/mL (ref 11–307)

## 2016-10-20 LAB — VITAMIN B12: Vitamin B-12: 201 pg/mL (ref 180–914)

## 2016-10-20 LAB — FOLATE: Folate: 22 ng/mL (ref >5.9)

## 2016-10-20 MED ORDER — LORAZEPAM 2 MG/ML IJ SOLN
1.0000 mg | Freq: Once | INTRAMUSCULAR | Status: AC
  Administered 2016-10-20: 13:00:00 1 mg via INTRAVENOUS
  Filled 2016-10-20: qty 1

## 2016-10-20 NOTE — Clinical Social Work Note (Signed)
CSW consulted for possible SNF placement. PT is recommending SNF, however pt is Medicare OBS. CSW attempted to reach pt's daughter to address private pay, however CSW left a message requesting a return phone call. FL2/PASARR complete for possible SNF placement. Assessment to follow. CSW will continue to follow.   Darden Dates, MSW, LCSW Clinical Social Worker  [phone]

## 2016-10-20 NOTE — Evaluation (Signed)
Physical Therapy Evaluation Patient Details Name: Claire Washington MRN: [id number] DOB: Apr 04, 1925 Today's Date: 10/20/2016   History of Present Illness  Pt is a 83 y/o F who presented with L hip pain following fall secondary to dizziness.  Pt with PMH including chronic vertigo (has seen ENT and PT), subdural hematoma, CVA, basal cell carcinoma.    Clinical Impression  Pt admitted with above diagnosis. Pt currently with functional limitations due to the deficits listed below (see PT Problem List). Claire Washington presents with chronic dizziness and pt reports frequent near falls.  She lives alone and has been using RW at all times over the past 2 weeks due to increasing dizziness.  She currently requires min assist for sit>stand transfers due to LOB posteriorly due to dizziness.  Pt ambulates with a decreased gait speed and step to pattern due to L hip pain and distance limited to 15 ft due to hip pain.  Close min guard provided when pt ambulating due to dizziness and L hip pain.  Given pt's current mobility status, recommending SNF at d/c. Pt will benefit from skilled PT to increase their independence and safety with mobility to allow discharge to the venue listed below.      Follow Up Recommendations SNF    Equipment Recommendations  None recommended by PT    Recommendations for Other Services       Precautions / Restrictions Precautions Precautions: Fall;Other (comment) Precaution Comments: h/o dizziness with two bouts of PT and has seen ENT.  Awaiting Neuro consult. Restrictions Weight Bearing Restrictions: No      Mobility  Bed Mobility Overal bed mobility: Modified Independent             General bed mobility comments: Pt requires increased time and effort and uses bed rail.  No physical assist or cues needed.  Transfers Overall transfer level: Needs assistance Equipment used: Rolling walker (2 wheeled) Transfers: Sit to/from [facility] Sit to Stand: Min  assist Stand pivot transfers: Min guard       General transfer comment: Pt requires min assist when standing from bed due to dizziness and posterior LOB.  Dizziness improves and pt able to pivot to chair with close min guard assist and cues for RW mangagement.    Ambulation/Gait Ambulation/Gait assistance: Min guard Ambulation Distance (Feet): 15 Feet Assistive device: Rolling walker (2 wheeled) Gait Pattern/deviations: Step-to pattern;Decreased stance time - left;Decreased weight shift to left;Decreased step length - right;Antalgic Gait velocity: decreased Gait velocity interpretation: <1.8 ft/sec, indicative of risk for recurrent falls General Gait Details: Pt performs step to gait pattern due to L hip pain.  Pt with decreased gait speed and distance limited due to L hip pain.  Close min guard provided due to unsteadiness due to L hip pain and dizziness.   Stairs            Wheelchair Mobility    Modified Rankin (Stroke Patients Only)       Balance Overall balance assessment: Needs assistance Sitting-balance support: No upper extremity supported;Feet supported Sitting balance-Leahy Scale: Good     Standing balance support: During functional activity;Bilateral upper extremity supported Standing balance-Leahy Scale: Poor Standing balance comment: Pt unable to maintain balance in static standing without UE support                             Pertinent Vitals/Pain Pain Assessment: 0-10 Pain Score: 7  Pain Location: L hip Pain  Descriptors / Indicators: Aching;Discomfort Pain Intervention(s): Limited activity within patient's tolerance;Monitored during session;Repositioned    Home Living Family/patient expects to be discharged to:: Private residence Living Arrangements: Alone Available Help at Discharge: Friend(s);Available PRN/intermittently Type of Home: House Home Access: Level entry     Home Layout: One level Home Equipment: Walker - 4  wheels;Shower seat;Grab bars - tub/shower;Grab bars - toilet;Hand held shower head;Wheelchair - Education officer, community - power;Cane - single point      Prior Function Level of Independence: Independent with assistive device(s)         Comments: Pt has been using her rollator at all times over the past 2 weeks as her dizziness has increased.  Before this she was only using rollator when she was out of the house.  Pt is independent with all other aspects of mobility.  She cooks, cleans, bathes, dresses, without assistance.  She normally drives but has not been doing so over the past 2 weeks with her dizziness worsening.  Pt exercises daily.  Pt denies any falls in the past 6 months but reports she has had a lot of "close calls".      Hand Dominance        Extremity/Trunk Assessment   Upper Extremity Assessment Upper Extremity Assessment: Overall WFL for tasks assessed    Lower Extremity Assessment Lower Extremity Assessment: LLE deficits/detail LLE Deficits / Details: LLE strength grossly 3+/5, limited by L hip pain       Communication   Communication: No difficulties  Cognition Arousal/Alertness: Awake/alert Behavior During Therapy: WFL for tasks assessed/performed Overall Cognitive Status: Within Functional Limits for tasks assessed                                        General Comments      Exercises General Exercises - Lower Extremity Ankle Circles/Pumps: AROM;Both;10 reps;Supine Hip ABduction/ADduction: AAROM;AROM;Left;10 reps;Supine Straight Leg Raises: AROM;AAROM;Both;10 reps;Supine   Assessment/Plan    PT Assessment Patient needs continued PT services  PT Problem List Decreased strength;Decreased activity tolerance;Decreased balance;Decreased knowledge of use of DME;Decreased safety awareness;Pain       PT Treatment Interventions DME instruction;Gait training;Functional mobility training;Therapeutic activities;Therapeutic exercise;Balance  training;Modalities;Patient/family education;Neuromuscular re-education    PT Goals (Current goals can be found in the Care Plan section)  Acute Rehab PT Goals Patient Stated Goal: to get stronger and regain independence PT Goal Formulation: With patient Time For Goal Achievement: 11/03/16 Potential to Achieve Goals: Fair    Frequency Min 2X/week   Barriers to discharge Decreased caregiver support Pt lives alone    Co-evaluation               AM-PAC PT "6 Clicks" Daily Activity  Outcome Measure Difficulty turning over in bed (including adjusting bedclothes, sheets and blankets)?: None Difficulty moving from lying on back to sitting on the side of the bed? : Total (uses bed rail) Difficulty sitting down on and standing up from a chair with arms (e.g., wheelchair, bedside commode, etc,.)?: Total Help needed moving to and from a bed to chair (including a wheelchair)?: A Little Help needed walking in hospital room?: A Little Help needed climbing 3-5 steps with a railing? : A Little 6 Click Score: 15    End of Session Equipment Utilized During Treatment: Gait belt Activity Tolerance: Patient limited by pain Patient left: in chair;with call bell/phone within reach Nurse Communication: Mobility status PT Visit Diagnosis:  Pain;Unsteadiness on feet (R26.81);Other abnormalities of gait and mobility (R26.89) Pain - Right/Left: Left Pain - part of body: Hip    Time: 0717-0751 PT Time Calculation (min) (ACUTE ONLY): 34 min   Charges:   PT Evaluation $PT Eval Low Complexity: 1 Procedure PT Treatments $Therapeutic Activity: 8-22 mins   PT G Codes:   PT G-Codes **NOT FOR INPATIENT CLASS** Functional Assessment Tool Used: Clinical judgement;AM-PAC 6 Clicks Basic Mobility Functional Limitation: Mobility: Walking and moving around Mobility: Walking and Moving Around Current Status (B6184): At least 40 percent but less than 60 percent impaired, limited or restricted Mobility:  Walking and Moving Around Goal Status [phone]): At least 1 percent but less than 20 percent impaired, limited or restricted    Collie Siad PT, DPT 10/20/2016, 8:46 AM

## 2016-10-20 NOTE — Consult Note (Addendum)
Reason for Consult:Dizziness Referring Physician: Hugelmeyer  CC: Dizziness  HPI: Claire Washington is an 83 y.o. female with a history of stroke who reports progressive worsening of dysequilibrium.  Patient was felt to symptomatically have a stroke in February of this year.  Was seen by neurology and a MRI was repeated at that time (07/2016).  Patient was noted to have two small bilateral acute infarcts.  Small vessel disease noted as well.  Patient reports that her dizziness and difficulty have continued to worsen.  Dizziness is worse when up for a long period of time and with her eyes closed.  Has been seen by cardiology and ENT as well.  Has a history of OH.  Feels her current symptoms led to her most recent fall which precipitated her admission.   Patient on Plavix.    Past Medical History:  Diagnosis Date  . Basal cell carcinoma    basel cell  . Chronic cystitis   . Embolic stroke ([place])    a. 07/2016 MRI Brain: small right superior cerebellum and left lateral occipital cortex infarcts;  b. 08/2016 Echo: EF 60-65%, Gr1 DD, mild MR;  c. 08/2016 30 Day Event Monitor: No significant tachy/bradyarrhythmias. No afib.  Marland Kitchen Hyperlipidemia   . Hypertension   . Leg pain   . Osteoarthritis   . Small vessel disease   . Subdural hematoma ([place])    a. 2013 in setting of syncope.  . Syncope and collapse    a. 2013 - syncope and fall w/ resultant subdural hematoma; b. prev nl echo and stress test @ [facility].  Marland Kitchen Thyroid nodule   . Vertigo & Chronic Dizziness    a. Previously seen by ENT with Physical Therapy.    Past Surgical History:  Procedure Laterality Date  . BILATERAL SALPINGOOPHORECTOMY    . COLONOSCOPY    . CYSTOSCOPY    . TOTAL ABDOMINAL HYSTERECTOMY      Family History  Problem Relation Age of Onset  . Heart disease Father     Social History:  reports that she has quit smoking. Her smoking use included Cigarettes. She has a 12.50 pack-year smoking history. She has never  used smokeless tobacco. She reports that she drinks alcohol. She reports that she does not use drugs.  Allergies  Allergen Reactions  . Hydrocortisone Swelling  . Sulfa Antibiotics     Medications:  I have reviewed the patient's current medications. Prior to Admission:  Prescriptions Prior to Admission  Medication Sig Dispense Refill Last Dose  . amLODipine (NORVASC) 10 MG tablet Take 1 tablet (10 mg total) by mouth daily. 180 tablet 3   . amLODipine (NORVASC) 2.5 MG tablet Take 2.5-5 mg by mouth daily.   10/12/2016 at am  . clopidogrel (PLAVIX) 75 MG tablet Take 1 tablet (75 mg total) by mouth daily. (Patient taking differently: Take 75 mg by mouth at bedtime. ) 90 tablet 3 10/12/2016 at 2100  . losartan (COZAAR) 100 MG tablet Take 100 mg by mouth at bedtime.    10/12/2016 at pm  . meclizine (ANTIVERT) 12.5 MG tablet Take 1 tablet (12.5 mg total) by mouth 3 (three) times daily as needed for dizziness. 30 tablet 3 PRN at PRN  . metoprolol tartrate (LOPRESSOR) 25 MG tablet Take 0.5 tablets (12.5 mg total) by mouth 2 (two) times daily. (Patient taking differently: Take 12.5-25 mg by mouth 2 (two) times daily. ) 30 tablet 3 10/12/2016 at 2100  . Multiple Vitamins-Minerals (CENTRUM SILVER PO) Take 1  tablet by mouth daily. Reported on 06/11/2015   Past Week at am  . rosuvastatin (CRESTOR) 10 MG tablet TAKE 1 TABLET (10 MG TOTAL) BY MOUTH DAILY. (Patient taking differently: Take 5 mg by mouth daily. ) 30 tablet 3 10/12/2016 at am   Scheduled: . amLODipine  10 mg Oral Daily  . clopidogrel  75 mg Oral QHS  . enoxaparin (LOVENOX) injection  40 mg Subcutaneous Q24H  . LORazepam  1 mg Intravenous Once  . losartan  100 mg Oral QHS  . metoprolol tartrate  12.5 mg Oral BID  . rosuvastatin  5 mg Oral Daily  . traZODone  50 mg Oral QHS    ROS: History obtained from the patient  General ROS: negative for - chills, fatigue, fever, night sweats, weight gain or weight loss Psychological ROS: negative for  - behavioral disorder, hallucinations, memory difficulties, mood swings or suicidal ideation Ophthalmic ROS: negative for - blurry vision, double vision, eye pain or loss of vision ENT ROS: negative for - epistaxis, nasal discharge, oral lesions, sore throat, tinnitus or vertigo Allergy and Immunology ROS: negative for - hives or itchy/watery eyes Hematological and Lymphatic ROS: negative for - bleeding problems, bruising or swollen lymph nodes Endocrine ROS: negative for - galactorrhea, hair pattern changes, polydipsia/polyuria or temperature intolerance Respiratory ROS: negative for - cough, hemoptysis, shortness of breath or wheezing Cardiovascular ROS: negative for - chest pain, dyspnea on exertion, edema or irregular heartbeat Gastrointestinal ROS: negative for - abdominal pain, diarrhea, hematemesis, nausea/vomiting or stool incontinence Genito-Urinary ROS: negative for - dysuria, hematuria, incontinence or urinary frequency/urgency Musculoskeletal ROS: negative for - joint swelling or muscular weakness Neurological ROS: as noted in HPI Dermatological ROS: negative for rash and skin lesion changes  Physical Examination: Blood pressure (!) 159/51, pulse 65, temperature (!) 97.4 F (36.3 C), temperature source Oral, resp. rate 14, height 5\' 1"  (1.549 m), weight 52.8 kg (116 lb 4.8 oz), SpO2 97 %.  HEENT-  Normocephalic, no lesions, without obvious abnormality.  Normal external eye and conjunctiva.  Normal TM's bilaterally.  Normal auditory canals and external ears. Normal external nose, mucus membranes and septum.  Normal pharynx. Cardiovascular- S1, S2 normal, pulses palpable throughout   Lungs- chest clear, no wheezing, rales, normal symmetric air entry Abdomen- soft, non-tender; bowel sounds normal; no masses,  no organomegaly Extremities- no edema Lymph-no adenopathy palpable Musculoskeletal-left hip pain Skin-bruising on left sided of body  Neurological Examination   Mental  Status: Alert, oriented, thought content appropriate.  Speech fluent without evidence of aphasia.  Able to follow 3 step commands without difficulty. Cranial Nerves: II: Discs flat bilaterally; Visual fields grossly normal, pupils equal, round, reactive to light and accommodation III,IV, VI: ptosis not present, extra-ocular motions intact bilaterally with sustained nystagmus on right lateral gaze and reproduction of symptoms to some degree V,VII: decreased right NLF, facial light touch sensation normal bilaterally VIII: hearing normal bilaterally IX,X: gag reflex present XI: bilateral shoulder shrug XII: midline tongue extension Motor: Right : Upper extremity   5-/5    Left:     Upper extremity   5-/5  Lower extremity   5-/5     Lower extremity   Limited by hip pain but able to lift off bed Tone and bulk:normal tone throughout; no atrophy noted Sensory: Pinprick and light touch intact throughout, bilaterally Deep Tendon Reflexes: 2+ and symmetric with absent AJ's bilaterally Plantars: Right: upgoing   Left: upgoing Cerebellar: Normal finger-to-nose and normal heel-to-shin testing bilaterally Gait: not tested  due to hip pain   Laboratory Studies:   Basic Metabolic Panel:  Recent Labs Lab 10/19/16 1821 10/20/16 0448  NA 132* 136  K 4.9 3.9  CL 97* 104  CO2 27 27  GLUCOSE 159* 126*  BUN 11 10  CREATININE 0.53 0.56  CALCIUM 9.3 8.9  MG 1.8  --   PHOS 3.1  --     Liver Function Tests:  Recent Labs Lab 10/19/16 1821  AST 17  ALT 14  ALKPHOS 57  BILITOT 0.6  PROT 6.7  ALBUMIN 3.8   No results for input(s): LIPASE, AMYLASE in the last 168 hours. No results for input(s): AMMONIA in the last 168 hours.  CBC:  Recent Labs Lab 10/19/16 1821 10/20/16 0448  WBC 8.9 6.3  NEUTROABS 7.0*  --   HGB 14.9 14.0  HCT 44.3 41.0  MCV 84.9 84.7  PLT 193 172    Cardiac Enzymes: No results for input(s): CKTOTAL, CKMB, CKMBINDEX, TROPONINI in the last 168  hours.  BNP: Invalid input(s): POCBNP  CBG: No results for input(s): GLUCAP in the last 168 hours.  Microbiology: Results for orders placed or performed in visit on 07/26/13  Wound culture     Status: None   Collection Time: 07/26/13 10:25 AM  Result Value Ref Range Status   Micro Text Report   Final       SOURCE: LEFT ARM    ORGANISM 1                LIGHT GROWTH STAPHYLOCOCCUS EPIDERMITIS   ORGANISM 2                LIGHT GROWTH STAPHYLOCOCCUS LUGDUNENSIS   COMMENT                   TWO DIFFERENT SPECIES OF COAG.NEG.STAPH   COMMENT                   NO ANAEROBES ISOLATED IN 4 DAYS   GRAM STAIN                MODERATE WHITE BLOOD CELLS   GRAM STAIN                MANY GRAM POSITIVE COCCI IN PAIRS   ANTIBIOTIC                    ORG#1    ORG#2    ORG#3     CIPROFLOXACIN                 S        S        S         CLINDAMYCIN                   S        S        S         ERYTHROMYCIN                  R        S        S         GENTAMICIN                    S        S        S         LEVOFLOXACIN  S        S        S         OXACILLIN                     S        S        S         TETRACYCLINE                  S        S        S             Coagulation Studies: No results for input(s): LABPROT, INR in the last 72 hours.  Urinalysis:  Recent Labs Lab 10/19/16 2051  COLORURINE YELLOW*  LABSPEC 1.005  PHURINE 6.0  GLUCOSEU NEGATIVE  HGBUR NEGATIVE  BILIRUBINUR NEGATIVE  KETONESUR NEGATIVE  PROTEINUR NEGATIVE  NITRITE NEGATIVE  LEUKOCYTESUR TRACE*    Lipid Panel:  No results found for: CHOL, TRIG, HDL, CHOLHDL, VLDL, LDLCALC  HgbA1C: No results found for: HGBA1C  Urine Drug Screen:  No results found for: LABOPIA, COCAINSCRNUR, LABBENZ, AMPHETMU, THCU, LABBARB  Alcohol Level: No results for input(s): ETH in the last 168 hours.  Other results: EKG: sinus rhythm at 78 bpm.  Imaging: Ct Head Wo Contrast  Result Date: 10/19/2016 CLINICAL  DATA:  Fall with pain. EXAM: CT HEAD WITHOUT CONTRAST TECHNIQUE: Contiguous axial images were obtained from the base of the skull through the vertex without intravenous contrast. COMPARISON:  October 13, 2016 FINDINGS: Brain: No subdural, epidural, or subarachnoid hemorrhages identified. Cerebellum, brainstem, and basal cisterns are normal. Ventricles and sulci are unchanged. Scattered white matter changes again identified. No acute cortical ischemia or infarct. No mass effect or midline shift. Vascular: Calcified atherosclerosis is seen in the intracranial carotid arteries. Skull: Normal. Negative for fracture or focal lesion. Sinuses/Orbits: No acute finding. Other: None. IMPRESSION: No acute intracranial abnormality.  Chronic white matter changes. Electronically Signed   By: Dorise Bullion III M.D   On: 10/19/2016 20:20   Dg Hip Unilat W Or W/o Pelvis 2-3 Views Left  Result Date: 10/19/2016 CLINICAL DATA:  Left hip pain after fall. EXAM: DG HIP (WITH OR WITHOUT PELVIS) 2-3V LEFT COMPARISON:  No comparison studies available. FINDINGS: Bones are demineralized. No evidence for fracture. No dislocation. SI joints and symphysis pubis are unremarkable. IMPRESSION: No acute bony abnormality. Electronically Signed   By: Misty Stanley M.D.   On: 10/19/2016 19:07     Assessment/Plan:  83 year old female with a history of stroke, spinal stenosis and OH who presents with progressively worsening complaints of dizziness and gait instability.  Has been evaluated by neurology and has an upcoming appointment in a few weeks.  Head CT reviewed and shows no acute changes.  Suspect dizziness and gait ataxia are multifactorial being related to [facility], weakness from spinal stenosis and inner ear abnormalities.  Patient has not responded to therapy or Meclizine.  Can not rule out continued ischemic disease playing a part as well.    Recommendations: 1.  MRI of the brain without contrast.  If further ischemic disease noted would  add ASA to Plavix at 81mg  daily 2.  Patient to continue follow up with neurology  Alexis Goodell, MD Neurology [phone] 10/20/2016, 11:17 AM

## 2016-10-20 NOTE — NC FL2 (Signed)
[place] LEVEL OF CARE SCREENING TOOL     IDENTIFICATION  Patient Name: Claire Washington Birthdate: 17-Jan-1925 Sex: female Admission Date (Current Location): 10/19/2016  [place] and [place] Number:  Engineering geologist and Address:  [facility], [address] [place], [place] [address]      Provider Number: [id number]  Attending Physician Name and Address:  Epifanio Lesches, MD  Relative Name and Phone Number:       Current Level of Care: Hospital Recommended Level of Care: [place] Prior Approval Number:    Date Approved/Denied:   PASRR Number: [id number]  Discharge Plan: SNF    Current Diagnoses: Patient Active Problem List   Diagnosis Date Noted  . Gait instability 10/19/2016  . Dizziness 11/03/2015  . Hypertension 12/04/2011  . Hyperlipidemia 12/04/2011  . Syncope 12/04/2011  . Diabetes mellitus ([place]) 12/04/2011  . Smoking hx 12/04/2011    Orientation RESPIRATION BLADDER Height & Weight     Self, Time, Situation, Place  Normal Continent Weight: 116 lb 4.8 oz (52.8 kg) Height:  5\' 1"  (154.9 cm)  BEHAVIORAL SYMPTOMS/MOOD NEUROLOGICAL BOWEL NUTRITION STATUS      Continent Diet (Heart Healthy Diet, Thin Liquids)  AMBULATORY STATUS COMMUNICATION OF NEEDS Skin   Limited Assist Verbally Normal                       Personal Care Assistance Level of Assistance  Bathing, Feeding, Dressing Bathing Assistance: Limited assistance Feeding assistance: Independent Dressing Assistance: Limited assistance     Functional Limitations Info  Sight, Hearing, Speech Sight Info: Adequate Hearing Info: Adequate Speech Info: Adequate    SPECIAL CARE FACTORS FREQUENCY  PT (By licensed PT)     PT Frequency: 5              Contractures Contractures Info: Not present    Additional Factors Info  Code Status, Allergies Code Status Info: Full Code Allergies Info: Hydrocortisone, Sulfa  Antibiotics           Current Medications (10/20/2016):  This is the current hospital active medication list Current Facility-Administered Medications  Medication Dose Route Frequency Provider Last Rate Last Dose  . acetaminophen (TYLENOL) tablet 650 mg  650 mg Oral Q6H PRN Hugelmeyer, Alexis, DO       Or  . acetaminophen (TYLENOL) suppository 650 mg  650 mg Rectal Q6H PRN Hugelmeyer, Alexis, DO      . albuterol (PROVENTIL) (2.5 MG/3ML) 0.083% nebulizer solution 2.5 mg  2.5 mg Nebulization Q6H PRN Hugelmeyer, Alexis, DO      . amLODipine (NORVASC) tablet 10 mg  10 mg Oral Daily Hugelmeyer, Alexis, DO   10 mg at 10/20/16 0949  . bisacodyl (DULCOLAX) EC tablet 5 mg  5 mg Oral Daily PRN Hugelmeyer, Alexis, DO      . clopidogrel (PLAVIX) tablet 75 mg  75 mg Oral QHS Hugelmeyer, Alexis, DO   75 mg at 10/19/16 2349  . enoxaparin (LOVENOX) injection 40 mg  40 mg Subcutaneous Q24H Hugelmeyer, Alexis, DO   40 mg at 10/19/16 2353  . ipratropium (ATROVENT) nebulizer solution 0.5 mg  0.5 mg Nebulization Q6H PRN Hugelmeyer, Alexis, DO      . losartan (COZAAR) tablet 100 mg  100 mg Oral QHS Hugelmeyer, Alexis, DO   100 mg at 10/19/16 2350  . magnesium citrate solution 1 Bottle  1 Bottle Oral Once PRN Hugelmeyer, Alexis, DO      . meclizine (ANTIVERT)  tablet 12.5 mg  12.5 mg Oral TID PRN Hugelmeyer, Alexis, DO      . metoprolol tartrate (LOPRESSOR) tablet 12.5 mg  12.5 mg Oral BID Hugelmeyer, Alexis, DO   12.5 mg at 10/20/16 0948  . ondansetron (ZOFRAN) tablet 4 mg  4 mg Oral Q6H PRN Hugelmeyer, Alexis, DO       Or  . ondansetron (ZOFRAN) injection 4 mg  4 mg Intravenous Q6H PRN Hugelmeyer, Alexis, DO      . rosuvastatin (CRESTOR) tablet 5 mg  5 mg Oral Daily Hugelmeyer, Alexis, DO   5 mg at 10/20/16 0949  . senna-docusate (Senokot-S) tablet 1 tablet  1 tablet Oral QHS PRN Hugelmeyer, Alexis, DO      . traZODone (DESYREL) tablet 50 mg  50 mg Oral QHS Hugelmeyer, Alexis, DO   50 mg at 10/19/16 2353      Discharge Medications: Please see discharge summary for a list of discharge medications.  Relevant Imaging Results:  Relevant Lab Results:   Additional Information SSN:  [id number]  Darden Dates, LCSW

## 2016-10-20 NOTE — Progress Notes (Signed)
[facility] at [place] NAME: Claire Washington    MR#:  [id number]  DATE OF BIRTH:  08/23/1924  SUBJECTIVE: Admitted for fall, gait instability, left hip pain.   CHIEF COMPLAINT:   Chief Complaint  Patient presents with  . Fall  . Hip Pain    REVIEW OF SYSTEMS:   ROS CONSTITUTIONAL: No fever, fatigue or weakness.  EYES: No blurred or double vision.  EARS, NOSE, AND THROAT: No tinnitus or ear pain.  RESPIRATORY: No cough, shortness of breath, wheezing or hemoptysis.  CARDIOVASCULAR: No chest pain, orthopnea, edema.  GASTROINTESTINAL: No nausea, vomiting, diarrhea or abdominal pain.  GENITOURINARY: No dysuria, hematuria.  ENDOCRINE: No polyuria, nocturia,  HEMATOLOGY: No anemia, easy bruising or bleeding SKIN: No rash or lesion. MUSCULOSKELETAL: No joint pain or arthritis.   NEUROLOGIC: No tingling, numbness, weakness.  PSYCHIATRY: No anxiety or depression.   DRUG ALLERGIES:   Allergies  Allergen Reactions  . Hydrocortisone Swelling  . Sulfa Antibiotics     VITALS:  Blood pressure (!) 159/51, pulse 65, temperature (!) 97.4 F (36.3 C), temperature source Oral, resp. rate 14, height 5\' 1"  (1.549 m), weight 52.8 kg (116 lb 4.8 oz), SpO2 97 %.  PHYSICAL EXAMINATION:  GENERAL:  83 y.o.-year-old patient lying in the bed with no acute distress.  EYES: Pupils equal, round, reactive to light and accommodation. No scleral icterus. Extraocular muscles intact.  HEENT: Head atraumatic, normocephalic. Oropharynx and nasopharynx clear.  NECK:  Supple, no jugular venous distention. No thyroid enlargement, no tenderness.  LUNGS: Normal breath sounds bilaterally, no wheezing, rales,rhonchi or crepitation. No use of accessory muscles of respiration.  CARDIOVASCULAR: S1, S2 normal. No murmurs, rubs, or gallops.  ABDOMEN: Soft, nontender, nondistended. Bowel sounds present. No organomegaly or mass.  EXTREMITIES: No pedal edema, cyanosis, or  clubbing.  NEUROLOGIC: Cranial nerves II through XII are intact. Muscle strength 5/5 in all extremities. Sensation intact. Gait not checked.  PSYCHIATRIC: The patient is alert and oriented x 3.  SKIN: No obvious rash, lesion, or ulcer.    LABORATORY PANEL:   CBC  Recent Labs Lab 10/20/16 0448  WBC 6.3  HGB 14.0  HCT 41.0  PLT 172   ------------------------------------------------------------------------------------------------------------------  Chemistries   Recent Labs Lab 10/19/16 1821 10/20/16 0448  NA 132* 136  K 4.9 3.9  CL 97* 104  CO2 27 27  GLUCOSE 159* 126*  BUN 11 10  CREATININE 0.53 0.56  CALCIUM 9.3 8.9  MG 1.8  --   AST 17  --   ALT 14  --   ALKPHOS 57  --   BILITOT 0.6  --    ------------------------------------------------------------------------------------------------------------------  Cardiac Enzymes No results for input(s): TROPONINI in the last 168 hours. ------------------------------------------------------------------------------------------------------------------  RADIOLOGY:  Ct Head Wo Contrast  Result Date: 10/19/2016 CLINICAL DATA:  Fall with pain. EXAM: CT HEAD WITHOUT CONTRAST TECHNIQUE: Contiguous axial images were obtained from the base of the skull through the vertex without intravenous contrast. COMPARISON:  October 13, 2016 FINDINGS: Brain: No subdural, epidural, or subarachnoid hemorrhages identified. Cerebellum, brainstem, and basal cisterns are normal. Ventricles and sulci are unchanged. Scattered white matter changes again identified. No acute cortical ischemia or infarct. No mass effect or midline shift. Vascular: Calcified atherosclerosis is seen in the intracranial carotid arteries. Skull: Normal. Negative for fracture or focal lesion. Sinuses/Orbits: No acute finding. Other: None. IMPRESSION: No acute intracranial abnormality.  Chronic white matter changes. Electronically Signed   By: Dorise Bullion  III M.D   On: 10/19/2016  20:20   Dg Hip Unilat W Or W/o Pelvis 2-3 Views Left  Result Date: 10/19/2016 CLINICAL DATA:  Left hip pain after fall. EXAM: DG HIP (WITH OR WITHOUT PELVIS) 2-3V LEFT COMPARISON:  No comparison studies available. FINDINGS: Bones are demineralized. No evidence for fracture. No dislocation. SI joints and symphysis pubis are unremarkable. IMPRESSION: No acute bony abnormality. Electronically Signed   By: Misty Stanley M.D.   On: 10/19/2016 19:07    EKG:   Orders placed or performed during the hospital encounter of 10/19/16  . ED EKG  . ED EKG  . EKG 12-Lead  . EKG 12-Lead  . EKG 12-Lead    ASSESSMENT AND PLAN:   #1 .recurrent falls due to dizziness: Seen by ENT, cardiology as an outpatient. Workup has been negative, has history of carotid stenosis and follows with Dr. dew, hip x-rays are negative for fracture distal has no orthostatic hypotension, patient has history of CVA, chronic vertigo, seen by physical therapy recommended  rehabilitation. Follow MRI.  #2 history of previous strokes: Continue aspirin, Plavix #3. left hip pain status post fall: X-ray of the left hip is negative for fracture.  denies any pain now.  #. History of hypertension -Continue Norvasc, metoprolol, Cozaar  #. History of hyperlipidemia -Continue Crestor  All the records are reviewed and case discussed with Care Management/Social Workerr. Management plans discussed with the patient, family and they are in agreement.  CODE STATUS: full  TOTAL TIME TAKING CARE OF THIS PATIENT:61minutes.   POSSIBLE D/C IN 1-2 DAYS, DEPENDING ON CLINICAL CONDITION.   Epifanio Lesches M.D on 10/20/2016 at 11:31 AM  Between 7am to 6pm - Pager - [phone]  After 6pm go to [email or web address] - password [email or web address] [facility]  Office  [phone]  CC: Primary care physician; Tracie Harrier, MD   Note: This dictation was prepared with Dragon dictation along with smaller phrase technology. Any  transcriptional errors that result from this process are unintentional.

## 2016-10-20 NOTE — Care Management Note (Signed)
Case Management Note  Patient Details  Name: Claire Washington MRN: [id number] Date of Birth: 03/20/1925  Subjective/Objective:  Met with patient at bedside. she lives at [facility] independent living community. She uses a walker but is able to proivde her own adls. Lives alone. Does not drive. No home O2 No home health. Has family but they do not live close by. Patient interested in SNF. Explained under observation status she will not be eligible for SNF. I told her I could set up any home helalth needs she may have.She is in agreement with POC. PT pending. Will follow.Marland Kitchen PCP is Dr. Ginette Pitman, last seen in the past 6 months.                   Action/Plan:   Expected Discharge Date:                  Expected Discharge Plan:     In-House Referral:     Discharge planning Services  CM Consult  Post Acute Care Choice:    Choice offered to:     DME Arranged:    DME Agency:     HH Arranged:    HH Agency:     Status of Service:  In process, will continue to follow  If discussed at Long Length of Stay Meetings, dates discussed:    Additional Comments:  Jolly Mango, RN 10/20/2016, 8:27 AM

## 2016-10-20 NOTE — Care Management Obs Status (Signed)
[place] NOTIFICATION   Patient Details  Name: Claire Washington MRN: [id number] Date of Birth: March 09, 1925   Medicare Observation Status Notification Given:  Yes    Jolly Mango, RN 10/20/2016, 8:26 AM

## 2016-10-21 DIAGNOSIS — R270 Ataxia, unspecified: Secondary | ICD-10-CM | POA: Diagnosis not present

## 2016-10-21 DIAGNOSIS — R42 Dizziness and giddiness: Secondary | ICD-10-CM | POA: Diagnosis not present

## 2016-10-21 DIAGNOSIS — M25552 Pain in left hip: Secondary | ICD-10-CM | POA: Diagnosis not present

## 2016-10-21 NOTE — Progress Notes (Deleted)
Cardiology Office Note  Date:  10/21/2016   ID:  Claire Washington, DOB 1924-12-27, MRN [id number]  PCP:  Tracie Harrier, MD   No chief complaint on file.   HPI:  Claire Washington is a very pleasant 83 year-old woman with history of   Multiple embolic strokes on MRI hypertension, chronic dizziness, benign positional vertigo,  osteoarthritis,  hyperlipidemia,  carotid arterial disease (followed by Dr. Lucky Cowboy, 50% on there right),  chronic cystitis,  diabetes,  Long smoking history until age 87 Previous TIAs per the patient Chronic  dizziness in her head, gait instability, for years, progressive who presents for routine followup of her dizziness, hypertension, TIA/CVAs  Echo 08/2016:  Normal LV function No significant valve disease No indication from the echo as to cause of dizziness  Normal sinus rhythm Rare PVCs, <1% No other significant arrhythmia noted Symptoms of dizziness did not seem to correlate to underlying arrhythmia  In follow-up she reports having worsening equilibrium and dizziness Chronic issue dating back several years, worse recently Difficult time with equilibrium. Does not think it is vertigo Chronic leg weakness, balance instability  MRI of the head performed showing Atrophy and small vessel disease. Small foci of restricted diffusion in the RIGHT superior cerebellum and LEFT lateral occipital cortex, consistent with nonhemorrhagic embolic infarcts.  She denies any tachycardia or palpitations concerning for arrhythmia  Previous office visits she reported having episode of word confusion lasting less than 1 minute while sitting at a dinner party. Several words did not, now bright, she felt confused, and symptoms seem to resolve  Also reported having previous 20 minute episode of confusion, presented ball sitting at her computer. Could not think about how to turn off the computer,   then returned to her baseline  Friend of hers thought she did not sound  normal on the phone at this time   No regular exercise, walks with a walker  Previous Total chol 211  TSH 8.3.  She is taking very low-dose Crestor several days per week  EKG personally reviewed by myself on todays visit Shows normal sinus rhythm with rate 65 bpm no significant ST or T-wave changes  Other past medical history reviewed Episodes of syncope in the setting of GI issues/constipation, vasovagal episodes Sulfa allergy  History of swelling on high-dose amlodipine pneumonia November 2015.   syncope 08/25/2011. She was in her kitchen preparing dinner and she had acute loss of consciousness, hit her head, left temple on the refrigerator. She had a subarachnoid hemorrhage noted on evaluation at [facility], transferred to [facility] for further evaluation.   On her second episode, she reports having a cocktail, milk of magnesia with constipation. She was discharged to rehabilitation. While at rehabilitation, she was sitting on a bathroom commode, with constipation, had taken senna, she had lightheadedness and passed out for a brief period.   Previous Holter monitor and stress test several years ago at [place].   PMH:   has a past medical history of Basal cell carcinoma; Chronic cystitis; Embolic stroke ([place]); Hyperlipidemia; Hypertension; Leg pain; Osteoarthritis; Small vessel disease; Subdural hematoma ([place]); Syncope and collapse; Thyroid nodule; and Vertigo & Chronic Dizziness.  PSH:    Past Surgical History:  Procedure Laterality Date  . BILATERAL SALPINGOOPHORECTOMY    . COLONOSCOPY    . CYSTOSCOPY    . TOTAL ABDOMINAL HYSTERECTOMY      No current facility-administered medications for this visit.    No current outpatient prescriptions on file.   Facility-Administered Medications Ordered in  Other Visits  Medication Dose Route Frequency Provider Last Rate Last Dose  . acetaminophen (TYLENOL) tablet 650 mg  650 mg Oral Q6H PRN Hugelmeyer, Alexis, DO   650 mg at 10/20/16  2018   Or  . acetaminophen (TYLENOL) suppository 650 mg  650 mg Rectal Q6H PRN Hugelmeyer, Alexis, DO      . albuterol (PROVENTIL) (2.5 MG/3ML) 0.083% nebulizer solution 2.5 mg  2.5 mg Nebulization Q6H PRN Hugelmeyer, Alexis, DO      . amLODipine (NORVASC) tablet 10 mg  10 mg Oral Daily Hugelmeyer, Alexis, DO   10 mg at 10/21/16 1019  . bisacodyl (DULCOLAX) EC tablet 5 mg  5 mg Oral Daily PRN Hugelmeyer, Alexis, DO      . clopidogrel (PLAVIX) tablet 75 mg  75 mg Oral QHS Hugelmeyer, Alexis, DO   75 mg at 10/20/16 2125  . enoxaparin (LOVENOX) injection 40 mg  40 mg Subcutaneous Q24H Hugelmeyer, Alexis, DO   40 mg at 10/20/16 2125  . ipratropium (ATROVENT) nebulizer solution 0.5 mg  0.5 mg Nebulization Q6H PRN Hugelmeyer, Alexis, DO      . losartan (COZAAR) tablet 100 mg  100 mg Oral QHS Hugelmeyer, Alexis, DO   100 mg at 10/20/16 2127  . magnesium citrate solution 1 Bottle  1 Bottle Oral Once PRN Hugelmeyer, Alexis, DO      . meclizine (ANTIVERT) tablet 12.5 mg  12.5 mg Oral TID PRN Hugelmeyer, Alexis, DO      . metoprolol tartrate (LOPRESSOR) tablet 12.5 mg  12.5 mg Oral BID Hugelmeyer, Alexis, DO   12.5 mg at 10/21/16 1020  . ondansetron (ZOFRAN) tablet 4 mg  4 mg Oral Q6H PRN Hugelmeyer, Alexis, DO       Or  . ondansetron (ZOFRAN) injection 4 mg  4 mg Intravenous Q6H PRN Hugelmeyer, Alexis, DO      . rosuvastatin (CRESTOR) tablet 5 mg  5 mg Oral Daily Hugelmeyer, Alexis, DO   5 mg at 10/21/16 1021  . senna-docusate (Senokot-S) tablet 1 tablet  1 tablet Oral QHS PRN Hugelmeyer, Alexis, DO   1 tablet at 10/21/16 1022  . traZODone (DESYREL) tablet 50 mg  50 mg Oral QHS Hugelmeyer, Alexis, DO   50 mg at 10/20/16 2125     Allergies:   Hydrocortisone and Sulfa antibiotics   Social History:  The patient  reports that she has quit smoking. Her smoking use included Cigarettes. She has a 12.50 pack-year smoking history. She has never used smokeless tobacco. She reports that she drinks alcohol. She  reports that she does not use drugs.   Family History:   family history includes Heart disease in her father.    Review of Systems: Review of Systems  Constitutional: Negative.   Respiratory: Negative.   Cardiovascular: Negative.   Gastrointestinal: Negative.   Musculoskeletal: Negative.        Gait instability  Neurological: Positive for dizziness.       Episodes of confusion  Psychiatric/Behavioral: Negative.   All other systems reviewed and are negative.    PHYSICAL EXAM: VS:  There were no vitals taken for this visit. , BMI There is no height or weight on file to calculate BMI. GEN: Well nourished, well developed, in no acute distress , unstable gait, walks with a walker HEENT: normal  Neck: no JVD, carotid bruits, or masses Cardiac: RRR; no murmurs, rubs, or gallops,no edema  Respiratory:  clear to auscultation bilaterally, normal work of breathing GI: soft, nontender, nondistended, + BS  MS: no deformity or atrophy  Skin: warm and dry, no rash Neuro:  Strength and sensation are intact Psych: euthymic mood, full affect    Recent Labs: 10/19/2016: ALT 14; Magnesium 1.8; TSH 4.261 10/20/2016: BUN 10; Creatinine, Ser 0.56; Hemoglobin 14.0; Platelets 172; Potassium 3.9; Sodium 136    Lipid Panel No results found for: CHOL, HDL, LDLCALC, TRIG    Wt Readings from Last 3 Encounters:  10/19/16 116 lb 4.8 oz (52.8 kg)  10/13/16 126 lb 12.2 oz (57.5 kg)  09/20/16 120 lb 12 oz (54.8 kg)       ASSESSMENT AND PLAN:  Other secondary hypertension Blood pressure is well controlled on today's visit. No changes made to the medications. Amlodipine dose recently cut in half by primary care  Dizziness Periods of lightheadedness, gait instability, confusion MRI showing possible embolic strokes  Stroke Multiple embolic strokes on MRI Etiology unclear, moderate carotid disease on the right followed by Dr. Lucky Cowboy We have recommended echocardiogram to evaluate LV function,  thrombus  and event monitor to rule out arrhythmia such as atrial fibrillation She had recent carotid ultrasound with Dr. Lucky Cowboy  2017, stable disease Long discussion today concerning her symptoms  Smoking hx Currently a nonsmoker  Type 2 diabetes mellitus with other circulatory complication, without long-term current use of insulin ([place]) Recommended oh carbohydrate diet  Mixed hyperlipidemia Tolerating low-dose Crestor, previous myalgias  Acute confusion On her last clinic visit had confusion for short period of time, possibly from TIA MRI head complete, she did not want MRI with contrast Unable to exclude cerebrovascular disease such as vertebral artery disease Scheduled to see neurology   Total encounter time more than 45 minutes  Greater than 50% was spent in counseling and coordination of care with the patient   Disposition:   F/U  6 months   No orders of the defined types were placed in this encounter.    Signed, Esmond Plants, M.D., Ph.D. 10/21/2016  [place], [place] \

## 2016-10-21 NOTE — Progress Notes (Signed)
[facility] at [place] NAME: Stephinie Battisti    MR#:  [id number]  DATE OF BIRTH:  [birth year]  SUBJECTIVE: She is doing little better but still not stable when walking.   CHIEF COMPLAINT:   Chief Complaint  Patient presents with  . Fall  . Hip Pain    REVIEW OF SYSTEMS:   ROS CONSTITUTIONAL: No fever, fatigue or weakness.  EYES: No blurred or double vision.  EARS, NOSE, AND THROAT: No tinnitus or ear pain.  RESPIRATORY: No cough, shortness of breath, wheezing or hemoptysis.  CARDIOVASCULAR: No chest pain, orthopnea, edema.  GASTROINTESTINAL: No nausea, vomiting, diarrhea or abdominal pain.  GENITOURINARY: No dysuria, hematuria.  ENDOCRINE: No polyuria, nocturia,  HEMATOLOGY: No anemia, easy bruising or bleeding SKIN: No rash or lesion. MUSCULOSKELETAL: No joint pain or arthritis.   NEUROLOGIC: No tingling, numbness, weakness.  PSYCHIATRY: No anxiety or depression.   DRUG ALLERGIES:   Allergies  Allergen Reactions  . Hydrocortisone Swelling  . Sulfa Antibiotics     VITALS:  Blood pressure (!) 125/47, pulse 90, temperature (!) 97.5 F (36.4 C), temperature source Oral, resp. rate 16, height 5\' 1"  (1.549 m), weight 52.8 kg (116 lb 4.8 oz), SpO2 97 %.  PHYSICAL EXAMINATION:  GENERAL:  83 y.o.-year-old patient lying in the bed with no acute distress.  EYES: Pupils equal, round, reactive to light and accommodation. No scleral icterus. Extraocular muscles intact.  HEENT: Head atraumatic, normocephalic. Oropharynx and nasopharynx clear.  NECK:  Supple, no jugular venous distention. No thyroid enlargement, no tenderness.  LUNGS: Normal breath sounds bilaterally, no wheezing, rales,rhonchi or crepitation. No use of accessory muscles of respiration.  CARDIOVASCULAR: S1, S2 normal. No murmurs, rubs, or gallops.  ABDOMEN: Soft, nontender, nondistended. Bowel sounds present. No organomegaly or mass.  EXTREMITIES: No pedal edema,  cyanosis, or clubbing.  NEUROLOGIC: Cranial nerves II through XII are intact. Muscle strength 5/5 in all extremities. Sensation intact. Gait not checked.  PSYCHIATRIC: The patient is alert and oriented x 3.  SKIN: No obvious rash, lesion, or ulcer.    LABORATORY PANEL:   CBC  Recent Labs Lab 10/20/16 0448  WBC 6.3  HGB 14.0  HCT 41.0  PLT 172   ------------------------------------------------------------------------------------------------------------------  Chemistries   Recent Labs Lab 10/19/16 1821 10/20/16 0448  NA 132* 136  K 4.9 3.9  CL 97* 104  CO2 27 27  GLUCOSE 159* 126*  BUN 11 10  CREATININE 0.53 0.56  CALCIUM 9.3 8.9  MG 1.8  --   AST 17  --   ALT 14  --   ALKPHOS 57  --   BILITOT 0.6  --    ------------------------------------------------------------------------------------------------------------------  Cardiac Enzymes No results for input(s): TROPONINI in the last 168 hours. ------------------------------------------------------------------------------------------------------------------  RADIOLOGY:  Ct Head Wo Contrast  Result Date: 10/19/2016 CLINICAL DATA:  Fall with pain. EXAM: CT HEAD WITHOUT CONTRAST TECHNIQUE: Contiguous axial images were obtained from the base of the skull through the vertex without intravenous contrast. COMPARISON:  October 13, 2016 FINDINGS: Brain: No subdural, epidural, or subarachnoid hemorrhages identified. Cerebellum, brainstem, and basal cisterns are normal. Ventricles and sulci are unchanged. Scattered white matter changes again identified. No acute cortical ischemia or infarct. No mass effect or midline shift. Vascular: Calcified atherosclerosis is seen in the intracranial carotid arteries. Skull: Normal. Negative for fracture or focal lesion. Sinuses/Orbits: No acute finding. Other: None. IMPRESSION: No acute intracranial abnormality.  Chronic white matter changes. Electronically Signed   By:  Dorise Bullion III M.D   On:  10/19/2016 20:20   Mr Brain Wo Contrast  Result Date: 10/20/2016 CLINICAL DATA:  Dizziness.  Known CVA. EXAM: MRI HEAD WITHOUT CONTRAST TECHNIQUE: Multiplanar, multiecho pulse sequences of the brain and surrounding structures were obtained without intravenous contrast. COMPARISON:  07/26/2016 brain MRI FINDINGS: Brain: No acute infarction, hemorrhage, hydrocephalus, extra-axial collection or mass lesion. Chronic small vessel ischemia with stable pattern of periventricular predominant cerebral involvement. Mild for age generalized cerebral volume loss. Remote small vessel infarct in the right cerebellum. Vascular: Major flow voids are preserved. Skull and upper cervical spine: Negative for marrow lesion. Sinuses/Orbits: No acute finding IMPRESSION: 1. No acute finding or change from April 2018. 2.  Chronic small vessel ischemia. Electronically Signed   By: Monte Fantasia M.D.   On: 10/20/2016 13:36   Dg Hip Unilat W Or W/o Pelvis 2-3 Views Left  Result Date: 10/19/2016 CLINICAL DATA:  Left hip pain after fall. EXAM: DG HIP (WITH OR WITHOUT PELVIS) 2-3V LEFT COMPARISON:  No comparison studies available. FINDINGS: Bones are demineralized. No evidence for fracture. No dislocation. SI joints and symphysis pubis are unremarkable. IMPRESSION: No acute bony abnormality. Electronically Signed   By: Misty Stanley M.D.   On: 10/19/2016 19:07    EKG:   Orders placed or performed during the hospital encounter of 10/19/16  . ED EKG  . ED EKG  . EKG 12-Lead  . EKG 12-Lead  . EKG 12-Lead    ASSESSMENT AND PLAN:   #1 .recurrent falls due to dizziness: Seen by ENT, cardiology as an outpatient. Workup has been negative, has history of carotid stenosis and follows with Dr. dew, hip x-rays are negative for fracture distal has no orthostatic hypotension, patient has history of CVA, chronic vertigo, More of the brain is negative for acute stroke. Physical therapy recommended skilled nursing but a cause of   Observation status  she cannot go to rehabilitation. Continue to work with physical therapy today, patient prefers to go home tomorrow. Discharge tomorrow with home health. Continue to work with physical therapy today. Discussed this with patient's nurse.  #2 history of previous strokes: Continue Plavix  #3. left hip pain status post fall: X-ray of the left hip is negative for fracture.  denies any pain now.  #. History of hypertension -Continue Norvasc, metoprolol, Cozaar  #. History of hyperlipidemia -Continue Crestor  All the records are reviewed and case discussed with Care Management/Social Workerr. Management plans discussed with the patient, family and they are in agreement.  CODE STATUS: full  TOTAL TIME TAKING CARE OF THIS PATIENT:67minutes.   POSSIBLE D/C IN 1-2 DAYS, DEPENDING ON CLINICAL CONDITION.   Epifanio Lesches M.D on 10/21/2016 at 10:34 AM  Between 7am to 6pm - Pager - [phone]  After 6pm go to [email or web address] - password [email or web address] [facility]  Office  [phone]  CC: Primary care physician; Tracie Harrier, MD   Note: This dictation was prepared with Dragon dictation along with smaller phrase technology. Any transcriptional errors that result from this process are unintentional.

## 2016-10-21 NOTE — Clinical Social Work Note (Addendum)
CSW received a phone call from the attending RN reporting that the patient's daughter feels that the patient should qualify for Medicare payment for SNF. CSW explained to the attending nurse that the patient is under observation status as she does not have a qualifying acute need under Medicare guidelines. CSW directed the RN to discuss more in depth with [facility] RNCM. The CSW has attempted to call the patient's daughter with no success. According to the attending MD's progress note from today, the attending MD is aware of observation status and insurance barrier. CSW is following.  Santiago Bumpers, MSW, Latanya Presser [phone]

## 2016-10-21 NOTE — Progress Notes (Signed)
Subjective: Patient reports feeling a little better today.  No new complaints.    Objective: Current vital signs: BP (!) 120/45   Pulse 72   Temp (!) 97.5 F (36.4 C) (Oral)   Resp 16   Ht 5\' 1"  (1.549 m)   Wt 52.8 kg (116 lb 4.8 oz)   SpO2 98%   BMI 21.97 kg/m  Vital signs in last 24 hours: Temp:  [97.5 F (36.4 C)] 97.5 F (36.4 C) (07/06 2014) Pulse Rate:  [72-90] 72 (07/07 1302) Resp:  [16] 16 (07/06 2014) BP: (120-132)/(45-48) 120/45 (07/07 1302) SpO2:  [97 %-98 %] 98 % (07/07 1302)  Intake/Output from previous day: 07/06 0701 - 07/07 0700 In: 720 [P.O.:720] Out: -  Intake/Output this shift: Total I/O In: 480 [P.O.:480] Out: -  Nutritional status: Diet Heart Room service appropriate? Yes; Fluid consistency: Thin  Neurologic Exam: Mental Status: Alert, oriented, thought content appropriate.  Speech fluent without evidence of aphasia.  Able to follow 3 step commands without difficulty. Cranial Nerves: II: Discs flat bilaterally; Visual fields grossly normal, pupils equal, round, reactive to light and accommodation III,IV, VI: ptosis not present, extra-ocular motions intact bilaterally with sustained nystagmus on right lateral gaze and reproduction of symptoms to some degree V,VII: decreased right NLF, facial light touch sensation normal bilaterally VIII: hearing normal bilaterally IX,X: gag reflex present XI: bilateral shoulder shrug XII: midline tongue extension Motor: Right :  Upper extremity   5-/5                                     Left:     Upper extremity   5-/5             Lower extremity   5-/5                                                 Lower extremity   Limited by hip pain but able to lift off bed Tone and bulk:normal tone throughout; no atrophy noted Sensory: Pinprick and light touch intact throughout, bilaterally  Lab Results: Basic Metabolic Panel:  Recent Labs Lab 10/19/16 1821 10/20/16 0448  NA 132* 136  K 4.9 3.9  CL 97* 104  CO2 27  27  GLUCOSE 159* 126*  BUN 11 10  CREATININE 0.53 0.56  CALCIUM 9.3 8.9  MG 1.8  --   PHOS 3.1  --     Liver Function Tests:  Recent Labs Lab 10/19/16 1821  AST 17  ALT 14  ALKPHOS 57  BILITOT 0.6  PROT 6.7  ALBUMIN 3.8   No results for input(s): LIPASE, AMYLASE in the last 168 hours. No results for input(s): AMMONIA in the last 168 hours.  CBC:  Recent Labs Lab 10/19/16 1821 10/20/16 0448  WBC 8.9 6.3  NEUTROABS 7.0*  --   HGB 14.9 14.0  HCT 44.3 41.0  MCV 84.9 84.7  PLT 193 172    Cardiac Enzymes: No results for input(s): CKTOTAL, CKMB, CKMBINDEX, TROPONINI in the last 168 hours.  Lipid Panel: No results for input(s): CHOL, TRIG, HDL, CHOLHDL, VLDL, LDLCALC in the last 168 hours.  CBG: No results for input(s): GLUCAP in the last 168 hours.  Microbiology: Results for orders placed or performed in visit on 07/26/13  Wound culture     Status: None   Collection Time: 07/26/13 10:25 AM  Result Value Ref Range Status   Micro Text Report   Final       SOURCE: LEFT ARM    ORGANISM 1                LIGHT GROWTH STAPHYLOCOCCUS EPIDERMITIS   ORGANISM 2                LIGHT GROWTH STAPHYLOCOCCUS LUGDUNENSIS   COMMENT                   TWO DIFFERENT SPECIES OF COAG.NEG.STAPH   COMMENT                   NO ANAEROBES ISOLATED IN 4 DAYS   GRAM STAIN                MODERATE WHITE BLOOD CELLS   GRAM STAIN                MANY GRAM POSITIVE COCCI IN PAIRS   ANTIBIOTIC                    ORG#1    ORG#2    ORG#3     CIPROFLOXACIN                 S        S        S         CLINDAMYCIN                   S        S        S         ERYTHROMYCIN                  R        S        S         GENTAMICIN                    S        S        S         LEVOFLOXACIN                  S        S        S         OXACILLIN                     S        S        S         TETRACYCLINE                  S        S        S             Coagulation Studies: No results for  input(s): LABPROT, INR in the last 72 hours.  Imaging: Ct Head Wo Contrast  Result Date: 10/19/2016 CLINICAL DATA:  Fall with pain. EXAM: CT HEAD WITHOUT CONTRAST TECHNIQUE: Contiguous axial images were obtained from the base of the skull through the vertex without intravenous contrast. COMPARISON:  October 13, 2016 FINDINGS: Brain: No subdural, epidural, or subarachnoid hemorrhages identified. Cerebellum, brainstem,  and basal cisterns are normal. Ventricles and sulci are unchanged. Scattered white matter changes again identified. No acute cortical ischemia or infarct. No mass effect or midline shift. Vascular: Calcified atherosclerosis is seen in the intracranial carotid arteries. Skull: Normal. Negative for fracture or focal lesion. Sinuses/Orbits: No acute finding. Other: None. IMPRESSION: No acute intracranial abnormality.  Chronic white matter changes. Electronically Signed   By: Dorise Bullion III M.D   On: 10/19/2016 20:20   Mr Brain Wo Contrast  Result Date: 10/20/2016 CLINICAL DATA:  Dizziness.  Known CVA. EXAM: MRI HEAD WITHOUT CONTRAST TECHNIQUE: Multiplanar, multiecho pulse sequences of the brain and surrounding structures were obtained without intravenous contrast. COMPARISON:  07/26/2016 brain MRI FINDINGS: Brain: No acute infarction, hemorrhage, hydrocephalus, extra-axial collection or mass lesion. Chronic small vessel ischemia with stable pattern of periventricular predominant cerebral involvement. Mild for age generalized cerebral volume loss. Remote small vessel infarct in the right cerebellum. Vascular: Major flow voids are preserved. Skull and upper cervical spine: Negative for marrow lesion. Sinuses/Orbits: No acute finding IMPRESSION: 1. No acute finding or change from April 2018. 2.  Chronic small vessel ischemia. Electronically Signed   By: Monte Fantasia M.D.   On: 10/20/2016 13:36   Dg Hip Unilat W Or W/o Pelvis 2-3 Views Left  Result Date: 10/19/2016 CLINICAL DATA:  Left hip pain  after fall. EXAM: DG HIP (WITH OR WITHOUT PELVIS) 2-3V LEFT COMPARISON:  No comparison studies available. FINDINGS: Bones are demineralized. No evidence for fracture. No dislocation. SI joints and symphysis pubis are unremarkable. IMPRESSION: No acute bony abnormality. Electronically Signed   By: Misty Stanley M.D.   On: 10/19/2016 19:07    Medications:  I have reviewed the patient's current medications. Scheduled: . amLODipine  10 mg Oral Daily  . clopidogrel  75 mg Oral QHS  . enoxaparin (LOVENOX) injection  40 mg Subcutaneous Q24H  . losartan  100 mg Oral QHS  . metoprolol tartrate  12.5 mg Oral BID  . rosuvastatin  5 mg Oral Daily  . traZODone  50 mg Oral QHS    Assessment/Plan: Patient stable.  MRI of the brain reviewed and shows no acute changes.  Progressive dizziness not likely secondary to progressive small vessel disease.  Symptoms likely multifactorial.    Recommendations: 1.  Continue ASA.  Plavix not indicated at this time.   2.  Patient to continue follow up with neurology on an outpatient basis.   LOS: 0 days   Alexis Goodell, MD Neurology [phone] 10/21/2016  2:38 PM

## 2016-10-21 NOTE — Clinical Social Work Note (Signed)
Clinical Social Work Assessment  Patient Details  Name: Claire Washington MRN: [id number] Date of Birth: 1924/05/30  Date of referral:  10/21/16               Reason for consult:  Facility Placement                Permission sought to share information with:  Chartered certified accountant granted to share information::  Yes, Verbal Permission Granted  Name::        Agency::     Relationship::     Contact Information:     Housing/Transportation Living arrangements for the past 2 months:  Single Family Home Source of Information:  Patient Patient Interpreter Needed:  None Criminal Activity/Legal Involvement Pertinent to Current Situation/Hospitalization:  No - Comment as needed Significant Relationships:  Adult Children Lives with:  Self Do you feel safe going back to the place where you live?  Yes Need for family participation in patient care:  No (Coment)  Care giving concerns:  PT recommendation for SNF/Patient under obs status   Social Worker assessment / plan:  CSW met with patient at bedside to discuss discharge planning. The patient gave verbal permission to conduct CSW referral for SNF, and she verbalized understanding that this will be out of pocket. The CSW advised the patient that estimated daily costs would be delivered when able.  At baseline, the patient uses a Rollator and is continent of both bowel and bladder. The patient lives alone, and she has only her daughter as a support. The patient indicates that her daughter is unable to care for her in the home due to teaching duties. The CSW attempted to call the patient's daughter with no answer. The CSW left a voicemail.  The CSW has initiated the STR referral and is waiting for bed offers.  Employment status:  Retired Forensic scientist:  [employer] PT Recommendations:  [place] / Referral to community resources:  [place]  Patient/Family's Response to  care:  The patient thanked the CSW for assistance.  Patient/Family's Understanding of and Emotional Response to Diagnosis, Current Treatment, and Prognosis:  The patient verbalized understanding of the insurance barriers.  Emotional Assessment Appearance:  Appears stated age Attitude/Demeanor/Rapport:  Apprehensive Affect (typically observed):  Anxious, Apprehensive, Pleasant Orientation:  Oriented to Self, Oriented to Place, Oriented to  Time, Oriented to Situation Alcohol / Substance use:  Never Used Psych involvement (Current and /or in the community):  No (Comment)  Discharge Needs  Concerns to be addressed:  Care Coordination, Childcare Concerns, Financial / Insurance Concerns Readmission within the last 30 days:  No Current discharge risk:  Lives alone Barriers to Discharge:  Continued Medical Work up   [employer], LCSW 10/21/2016, 2:08 PM

## 2016-10-22 ENCOUNTER — Observation Stay: Payer: Medicare Other

## 2016-10-22 DIAGNOSIS — M25552 Pain in left hip: Secondary | ICD-10-CM | POA: Diagnosis not present

## 2016-10-22 MED ORDER — METHOCARBAMOL 500 MG PO TABS: 500.0000 mg | ORAL_TABLET | Freq: Three times a day (TID) | ORAL | Status: DC

## 2016-10-22 NOTE — Care Management Note (Signed)
Case Management Note  Patient Details  Name: Claire Washington MRN: [id number] Date of Birth: 04-30-1924  Subjective/Objective:     Discussed ABN letter with Ival Bible [facility] who discussed it with Dr Doy Hutching. Noted in CM Handoff note that Ms Severtson will need an ABN notice tomorrow if she does not discharge from [facility] per Dr Doy Hutching.                Action/Plan:   Expected Discharge Date:                  Expected Discharge Plan:     In-House Referral:     Discharge planning Services  CM Consult  Post Acute Care Choice:    Choice offered to:     DME Arranged:    DME Agency:     HH Arranged:    HH Agency:     Status of Service:  In process, will continue to follow  If discussed at Long Length of Stay Meetings, dates discussed:    Additional Comments:  Berish Bohman A, RN 10/22/2016, 5:07 PM

## 2016-10-22 NOTE — Progress Notes (Signed)
[place] [facility] [place] NAME: Yobana Culliton    MR#:  [id number]  DATE OF BIRTH:  [birth year]  SUBJECTIVE: She says that she was very dizzy and weak after she took a small block with nurses help. She still feels really weak and dizzy even with minimal ambulation and she thinks she needs to go to rehabilitation and she cannot go home. She also says she cannot afford rehabilitation paybut she does not want to go home. Vital stable, not orthostatic changes, head CT unremarkable, CBC, by BMP are normal. Patient denies any pain, no shortness of breath her main complaint is weakness, dizziness and unable to perform ADLs and unable to live  Alone at home.   CHIEF COMPLAINT:   Chief Complaint  Patient presents with  . Fall  . Hip Pain    REVIEW OF SYSTEMS:   ROS CONSTITUTIONAL: No fever, fatigue or weakness.  EYES: No blurred or double vision.  EARS, NOSE, AND THROAT: No tinnitus or ear pain.  RESPIRATORY: No cough, shortness of breath, wheezing or hemoptysis.   CARDIOVASCULAR: No chest pain, orthopnea, edema.  GASTROINTESTINAL: No nausea, vomiting, diarrhea or abdominal pain.  GENITOURINARY: No dysuria, hematuria.  ENDOCRINE: No polyuria, nocturia,  HEMATOLOGY: No anemia, easy bruising or bleeding SKIN: No rash or lesion. MUSCULOSKELETAL: No joint pain or arthritis.   NEUROLOGIC: No tingling, numbness, weakness. Dizziness, generalized weakness in the legs and legs are giving out. PSYCHIATRY: No anxiety or depression.   DRUG ALLERGIES:   Allergies  Allergen Reactions  . Hydrocortisone Swelling  . Sulfa Antibiotics     VITALS:  Blood pressure (!) 173/65, pulse 80, temperature 97.6 F (36.4 C), temperature source Oral, resp. rate 20, height 5\' 1"  (1.549 m), weight 52.8 kg (116 lb 4.8 oz), SpO2 96 %.  PHYSICAL EXAMINATION:  GENERAL:  83 y.o.-year-old patient lying in the bed with no acute distress.  EYES: Pupils equal, round, reactive to  light and accommodation. No scleral icterus. Extraocular muscles intact.  HEENT: Head atraumatic, normocephalic. Oropharynx and nasopharynx clear.  NECK:  Supple, no jugular venous distention. No thyroid enlargement, no tenderness.  LUNGS: Normal breath sounds bilaterally, no wheezing, rales,rhonchi or crepitation. No use of accessory muscles of respiration.  CARDIOVASCULAR: S1, S2 normal. No murmurs, rubs, or gallops.  ABDOMEN: Soft, nontender, nondistended. Bowel sounds present. No organomegaly or mass.  EXTREMITIES: No pedal edema, cyanosis, or clubbing.  NEUROLOGIC: Cranial nerves II through XII are intact. Muscle strength 5/5 in all extremities. Sensation intact. Gait not checked.  PSYCHIATRIC: The patient is alert and oriented x 3.  SKIN: No obvious rash, lesion, or ulcer.    LABORATORY PANEL:   CBC  Recent Labs Lab 10/20/16 0448  WBC 6.3  HGB 14.0  HCT 41.0  PLT 172   ------------------------------------------------------------------------------------------------------------------  Chemistries   Recent Labs Lab 10/19/16 1821 10/20/16 0448  NA 132* 136  K 4.9 3.9  CL 97* 104  CO2 27 27  GLUCOSE 159* 126*  BUN 11 10  CREATININE 0.53 0.56  CALCIUM 9.3 8.9  MG 1.8  --   AST 17  --   ALT 14  --   ALKPHOS 57  --   BILITOT 0.6  --    ------------------------------------------------------------------------------------------------------------------  Cardiac Enzymes No results for input(s): TROPONINI in the last 168 hours. ------------------------------------------------------------------------------------------------------------------  RADIOLOGY:  Mr Brain Wo Contrast  Result Date: 10/20/2016 CLINICAL DATA:  Dizziness.  Known CVA. EXAM: MRI HEAD WITHOUT CONTRAST TECHNIQUE: Multiplanar, multiecho  pulse sequences of the brain and surrounding structures were obtained without intravenous contrast. COMPARISON:  07/26/2016 brain MRI FINDINGS: Brain: No acute infarction,  hemorrhage, hydrocephalus, extra-axial collection or mass lesion. Chronic small vessel ischemia with stable pattern of periventricular predominant cerebral involvement. Mild for age generalized cerebral volume loss. Remote small vessel infarct in the right cerebellum. Vascular: Major flow voids are preserved. Skull and upper cervical spine: Negative for marrow lesion. Sinuses/Orbits: No acute finding IMPRESSION: 1. No acute finding or change from April 2018. 2.  Chronic small vessel ischemia. Electronically Signed   By: Monte Fantasia M.D.   On: 10/20/2016 13:36    EKG:   Orders placed or performed during the hospital encounter of 10/19/16  . ED EKG  . ED EKG  . EKG 12-Lead  . EKG 12-Lead  . EKG 12-Lead    ASSESSMENT AND PLAN:   #1 .recurrent falls due to dizziness: Seen by ENT, cardiology as an outpatient. Workup has been negative, has history of carotid stenosis and follows with Dr. dew, hip x-rays are negative for fracture distal has no orthostatic hypotension, patient has history of CVA, chronic vertigo, MRI the brain is negative for acute stroke. Physical therapy recommended skilled nursing but a cause of  Observation status  she cannot go to rehabilitation. Continue to work with physical therapy today, patient prefers to go  Rehab,. . She wants to go to rehabilitation. Does not want to go home,Continue physical therapy, continue meclizine. He also has  Spinal  stenosis. Symptoms are multifactorial. Add Robaxin to see if it helps #2 history of previous strokes: Continue Plavix  #3. left hip pain status post fall: X-ray of the left hip is negative for fracture.  denies any pain now.  #. History of hypertension -Continue Norvasc, metoprolol, Cozaar bp slightly high today morning.watch the trend  #. History of hyperlipidemia -Continue Crestor  All the records are reviewed and case discussed with Care Management/Social Workerr. Management plans discussed with the patient, family and  they are in agreement.  CODE STATUS: full  TOTAL TIME TAKING CARE OF THIS PATIENT:31minutes.   POSSIBLE D/C IN 1-2 DAYS, DEPENDING ON CLINICAL CONDITION.   Epifanio Lesches M.D on 10/22/2016 at 9:37 AM  Between 7am to 6pm - Pager - [phone]  After 6pm go to [email or web address] - password [email or web address] [facility]  Office  [phone]  CC: Primary care physician; Tracie Harrier, MD   Note: This dictation was prepared with Dragon dictation along with smaller phrase technology. Any transcriptional errors that result from this process are unintentional.

## 2016-10-22 NOTE — Clinical Social Work Note (Signed)
CSW visited with patient at bedside and discussed the pricing for [facility]. The patient reported that she cannot afford the rate, and she further indicated that her family is unable to assist her at home. The CSW suggested possible CNA assistance and home health options, which the patient reported would not be skilled enough for her needs. The CSW provided emotional support and explained the observation status with regards to Medicare. The patient indicated that she understands that the guidelines are not those of this hospital system and that she understands that the the CSW has made all efforts to attempt to find a SNF that would offer the patient private pay with only 1 week of up front payment rather than more.   The CSW will continue to follow for discharge planning. The patient is aware that a decision will need to be made by her no later than tomorrow.  Santiago Bumpers, MSW, Latanya Presser [phone]

## 2016-10-22 NOTE — Clinical Social Work Note (Signed)
CSW has contacted [facility]'s admissions coordinator to find out private pay rate to present to the patient. The patient is still under observation status. CSW is following.  Santiago Bumpers, MSW, Latanya Presser [phone]

## 2016-10-22 NOTE — Clinical Social Work Note (Signed)
CSW received notification from [facility] that the patient would be responsible for 1 week of payment in advance at $385 a day/$2695 total for the week. CSW has tried to contact the patient's daughter with no success. CSW will visit patient after lunch to discuss options.   Santiago Bumpers, MSW, Latanya Presser [phone]

## 2016-10-23 ENCOUNTER — Ambulatory Visit: Payer: Medicare Other | Admitting: Cardiovascular Disease

## 2016-10-23 ENCOUNTER — Encounter
Admission: RE | Admit: 2016-10-23 | Discharge: 2016-10-23 | Disposition: A | Discharge status: Home / Self Care | Payer: Medicare Other | Source: Ambulatory Visit | Attending: Internal Medicine | Admitting: Internal Medicine

## 2016-10-23 DIAGNOSIS — M25552 Pain in left hip: Secondary | ICD-10-CM | POA: Diagnosis not present

## 2016-10-23 LAB — CREATININE, SERUM
CREATININE: 0.85 mg/dL (ref 0.44–1.00)
GFR calc Af Amer: >60 mL/min (ref >60)
GFR, EST NON AFRICAN AMERICAN: 59 mL/min — AB (ref >60)

## 2016-10-23 NOTE — NC FL2 (Signed)
[place] LEVEL OF CARE SCREENING TOOL     IDENTIFICATION  Patient Name: Claire Washington Birthdate: 1924-11-29 Sex: female Admission Date (Current Location): 10/19/2016  [place] and [place] Number:  Engineering geologist and Address:  [facility], [address] [place], [place] [address]      Provider Number: [id number]  Attending Physician Name and Address:  Epifanio Lesches, MD  Relative Name and Phone Number:       Current Level of Care: Hospital Recommended Level of Care: [place] Prior Approval Number:    Date Approved/Denied:   PASRR Number: [id number]  Discharge Plan: SNF    Current Diagnoses: Patient Active Problem List   Diagnosis Date Noted  . Gait instability 10/19/2016  . Dizziness 11/03/2015  . Hypertension 12/04/2011  . Hyperlipidemia 12/04/2011  . Syncope 12/04/2011  . Diabetes mellitus ([place]) 12/04/2011  . Smoking hx 12/04/2011    Orientation RESPIRATION BLADDER Height & Weight     Self, Time, Situation, Place  Normal Continent Weight: 116 lb 4.8 oz (52.8 kg) Height:  5\' 1"  (154.9 cm)  BEHAVIORAL SYMPTOMS/MOOD NEUROLOGICAL BOWEL NUTRITION STATUS      Continent Diet (Heart Healthy Diet, Thin Liquids)  AMBULATORY STATUS COMMUNICATION OF NEEDS Skin   Limited Assist Verbally Normal                       Personal Care Assistance Level of Assistance  Bathing, Feeding, Dressing Bathing Assistance: Limited assistance Feeding assistance: Independent Dressing Assistance: Limited assistance     Functional Limitations Info  Sight, Hearing, Speech Sight Info: Adequate Hearing Info: Adequate Speech Info: Adequate    SPECIAL CARE FACTORS FREQUENCY  PT (By licensed PT)     PT Frequency: 5              Contractures Contractures Info: Not present    Additional Factors Info  Code Status, Allergies Code Status Info: Full Code Allergies Info: Hydrocortisone, Sulfa  Antibiotics           Current Medications (10/23/2016):  This is the current hospital active medication list Current Facility-Administered Medications  Medication Dose Route Frequency Provider Last Rate Last Dose  . acetaminophen (TYLENOL) tablet 650 mg  650 mg Oral Q6H PRN Hugelmeyer, Alexis, DO   650 mg at 10/20/16 2018   Or  . acetaminophen (TYLENOL) suppository 650 mg  650 mg Rectal Q6H PRN Hugelmeyer, Alexis, DO      . albuterol (PROVENTIL) (2.5 MG/3ML) 0.083% nebulizer solution 2.5 mg  2.5 mg Nebulization Q6H PRN Hugelmeyer, Alexis, DO      . amLODipine (NORVASC) tablet 10 mg  10 mg Oral Daily Hugelmeyer, Alexis, DO   10 mg at 10/23/16 0834  . bisacodyl (DULCOLAX) EC tablet 5 mg  5 mg Oral Daily PRN Hugelmeyer, Alexis, DO   5 mg at 10/22/16 0910  . clopidogrel (PLAVIX) tablet 75 mg  75 mg Oral QHS Hugelmeyer, Alexis, DO   75 mg at 10/22/16 2123  . enoxaparin (LOVENOX) injection 40 mg  40 mg Subcutaneous Q24H Hugelmeyer, Alexis, DO   40 mg at 10/22/16 2123  . ipratropium (ATROVENT) nebulizer solution 0.5 mg  0.5 mg Nebulization Q6H PRN Hugelmeyer, Alexis, DO      . losartan (COZAAR) tablet 100 mg  100 mg Oral QHS Hugelmeyer, Alexis, DO   100 mg at 10/22/16 2123  . magnesium citrate solution 1 Bottle  1 Bottle Oral Once PRN Hugelmeyer, Alexis, DO      .  metoprolol tartrate (LOPRESSOR) tablet 12.5 mg  12.5 mg Oral BID Hugelmeyer, Alexis, DO   12.5 mg at 10/23/16 0834  . ondansetron (ZOFRAN) tablet 4 mg  4 mg Oral Q6H PRN Hugelmeyer, Alexis, DO       Or  . ondansetron (ZOFRAN) injection 4 mg  4 mg Intravenous Q6H PRN Hugelmeyer, Alexis, DO      . rosuvastatin (CRESTOR) tablet 5 mg  5 mg Oral Daily Hugelmeyer, Alexis, DO   5 mg at 10/23/16 0834  . senna-docusate (Senokot-S) tablet 1 tablet  1 tablet Oral QHS PRN Hugelmeyer, Alexis, DO   1 tablet at 10/21/16 1022  . traZODone (DESYREL) tablet 50 mg  50 mg Oral QHS Hugelmeyer, Alexis, DO   50 mg at 10/22/16 2123     Discharge  Medications: Please see discharge summary for a list of discharge medications.  Relevant Imaging Results:  Relevant Lab Results:   Additional Information SSN:  [id number]  Irving Bloor, Veronia Beets, LCSW

## 2016-10-23 NOTE — Progress Notes (Signed)
Clinical Social Worker (CSW) met with patient to discuss D/C plan. Patient reported that she has decided to go to [facility] under private pay today. Patient is medically stable for D/C to [facility] today. Per [facility] admissions coordinator at [facility] patient can come today to room 204-B. RN will call report at [phone]. Patient's daughter Marcie Bal will pick patient up today between 11:30 and 12:30. Patient is aware of above. Patient's daughter Marcie Bal is aware of above. Please reconsult if future social work needs arise. CSW signing off.   [employer], LCSW [phone]

## 2016-10-23 NOTE — Clinical Social Work Placement (Signed)
   CLINICAL SOCIAL WORK PLACEMENT  NOTE  Date:  10/23/2016  Patient Details  Name: Claire Washington MRN: [id number] Date of Birth: 1925-03-26  Clinical Social Work is seeking post-discharge placement for this patient at the [place] level of care (*CSW will initial, date and re-position this form in  chart as items are completed):  Yes   Patient/family provided with [place] Work Department's list of facilities offering this level of care within the geographic area requested by the patient (or if unable, by the patient's family).  Yes   Patient/family informed of their freedom to choose among providers that offer the needed level of care, that participate in Medicare, Medicaid or managed care program needed by the patient, have an available bed and are willing to accept the patient.  Yes   Patient/family informed of [facility]'s ownership interest in [facility] and [facility], as well as of the fact that they are under no obligation to receive care at these facilities.  PASRR submitted to EDS on       PASRR number received on       Existing PASRR number confirmed on 10/21/16     FL2 transmitted to all facilities in geographic area requested by pt/family on 10/21/16     FL2 transmitted to all facilities within larger geographic area on       Patient informed that his/her managed care company has contracts with or will negotiate with certain facilities, including the following:        Yes   Patient/family informed of bed offers received.  Patient chooses bed at  [facility] )     Physician recommends and patient chooses bed at      Patient to be transferred to  [facility] ) on 10/23/16.  Patient to be transferred to facility by  (Patient's daughter Marcie Bal will transport patient today. )     Patient family notified on 10/16/16 of transfer.  Name of family member notified:   (Patient's daughter Marcie Bal is aware of D/C today.  )     PHYSICIAN       Additional Comment:    _______________________________________________ Xachary Hambly, Veronia Beets, LCSW 10/23/2016, 10:42 AM

## 2016-10-23 NOTE — Progress Notes (Signed)
Report called to Walker, Therapist, sports at [facility].  Discussed discharge instructions and medications with pt and her daughter. IV removed. All questions addressed. Pt transported to [facility] via car by her daughter.  Clarise Cruz, RN

## 2016-10-23 NOTE — Discharge Summary (Signed)
Claire Washington, is a 83 y.o. female  DOB 1924/09/04  MRN [id number].  Admission date:  10/19/2016  Admitting Physician  Harvie Bridge, DO  Discharge Date:  10/23/2016   Primary MD  Tracie Harrier, MD  Recommendations for primary care physician for things to follow:   Follow-up with PCP in 2 weeks   Admission Diagnosis  Ataxia [R27.0] Left hip pain [M25.552] Fall, initial encounter [W19.XXXA]   Discharge Diagnosis  Ataxia [R27.0] Left hip pain [M25.552] Fall, initial encounter [W19.XXXA]    Active Problems:   Gait instability      Past Medical History:  Diagnosis Date  . Basal cell carcinoma    basel cell  . Chronic cystitis   . Embolic stroke ([place])    a. 07/2016 MRI Brain: small right superior cerebellum and left lateral occipital cortex infarcts;  b. 08/2016 Echo: EF 60-65%, Gr1 DD, mild MR;  c. 08/2016 30 Day Event Monitor: No significant tachy/bradyarrhythmias. No afib.  Marland Kitchen Hyperlipidemia   . Hypertension   . Leg pain   . Osteoarthritis   . Small vessel disease   . Subdural hematoma ([place])    a. 2013 in setting of syncope.  . Syncope and collapse    a. 2013 - syncope and fall w/ resultant subdural hematoma; b. prev nl echo and stress test @ [facility].  Marland Kitchen Thyroid nodule   . Vertigo & Chronic Dizziness    a. Previously seen by ENT with Physical Therapy.    Past Surgical History:  Procedure Laterality Date  . BILATERAL SALPINGOOPHORECTOMY    . COLONOSCOPY    . CYSTOSCOPY    . TOTAL ABDOMINAL HYSTERECTOMY         History of present illness and  Hospital Course:     Kindly see H&P for history of present illness and admission details, please review complete Labs, Consult reports and Test reports for all details in brief  HPI  from the history and physical done on the day of  admission 83 year old female patient came in because of the fall, hip pain. Patient had ambulated difficulty getting worse. Patient admitted for same.   Hospital Course   1 dizziness, difficulty walking. Seen by neurology Dr. Alexis Goodell, MRI of the brain did not show any new stroke. Patient had workup as an outpatient with ENT, cardiology. Workup has been negative essentially. Patient is admitted for possible stroke. MRI brain did not show acute stroke. Echo done in May of this he has showed EF 60-65%. Patient also had event monitor as an outpatient which did not show any atrial fibrillation. Had history of previous stroke and she is taking Plavix, statins for that. This time patient had no orthostatic hypotension, her electrolytes, CBC is within normal range. Physical therapy recommended skilled nursing. Patient is medical observation so she is not able to qualify for and nursing. Pt  willing to pay for 1 week of rehabilitation stay, she told me that her son will pay for second week of rehabilitation stay. Patient is stable for discharge to rehabilitation.  #2 essential hypertension: Controlled #3/ osteoarthritis #4, ambulatory difficulty.   Discharge Condition: Stable   Follow UP  Follow-up Information    Tracie Harrier, MD Follow up in 2 week(s).   Specialty:  Internal Medicine Contact information: [place] [address] [phone]             Discharge Instructions  and  Discharge Medications     Allergies  as of 10/23/2016      Reactions   Hydrocortisone Swelling   Sulfa Antibiotics       Medication List    TAKE these medications   amLODipine 10 MG tablet Commonly known as:  NORVASC Take 1 tablet (10 mg total) by mouth daily. What changed:  Another medication with the same name was removed. Continue taking this medication, and follow the directions you see here.   CENTRUM SILVER PO Take 1 tablet by mouth daily.  Reported on 06/11/2015   clopidogrel 75 MG tablet Commonly known as:  PLAVIX Take 1 tablet (75 mg total) by mouth daily. What changed:  when to take this   losartan 100 MG tablet Commonly known as:  COZAAR Take 100 mg by mouth at bedtime.   meclizine 12.5 MG tablet Commonly known as:  ANTIVERT Take 1 tablet (12.5 mg total) by mouth 3 (three) times daily as needed for dizziness.   metoprolol tartrate 25 MG tablet Commonly known as:  LOPRESSOR Take 0.5 tablets (12.5 mg total) by mouth 2 (two) times daily. What changed:  how much to take   rosuvastatin 10 MG tablet Commonly known as:  CRESTOR TAKE 1 TABLET (10 MG TOTAL) BY MOUTH DAILY. What changed:  how much to take         Diet and Activity recommendation: See Discharge Instructions above   Consults obtained - Neurology, physical therapy   Major procedures and Radiology Reports - PLEASE review detailed and final reports for all details, in brief -      Ct Angio Head W Or Wo Contrast  Result Date: 10/13/2016 CLINICAL DATA:  Worsening dizziness over the past week. EXAM: CT ANGIOGRAPHY HEAD AND NECK TECHNIQUE: Multidetector CT imaging of the head and neck was performed using the standard protocol during bolus administration of intravenous contrast. Multiplanar CT image reconstructions and MIPs were obtained to evaluate the vascular anatomy. Carotid stenosis measurements (when applicable) are obtained utilizing NASCET criteria, using the distal internal carotid diameter as the denominator. CONTRAST:  75 mL Isovue 370 COMPARISON:  Carotid Doppler ultrasound 08/25/2016. Head MRI 07/26/2016. Neck MRA 11/23/2009. Chest CT 05/31/2015. FINDINGS: CTA NECK FINDINGS Aortic arch: Standard 3 vessel aortic arch with extensive calcified and soft plaque. No significant brachiocephalic or right subclavian artery stenosis. There is mild left subclavian artery stenosis proximally due to predominantly calcified plaque. Right carotid system: Patent  with mild calcified plaque in the proximal ICA not resulting in significant stenosis. Left carotid system: Patent with mild calcified and soft plaque in the proximal ICA not resulting in significant stenosis. Vertebral arteries: Patent and codominant. Calcified plaque at both vertebral artery origins does not result in significant stenosis. Skeleton: Advanced cervical disc and facet degeneration. Grade 1 anterolisthesis of C3 on C4 and C4 on C5. Other neck: Diffusely heterogeneous thyroid gland containing multiple nodules, including a dominant 2.1 cm left lobe nodule which has been previously evaluated by ultrasound. Upper chest: Unchanged 2.7 x 1.2 cm right upper lobe lung nodule. Review of the MIP images confirms the above findings CTA HEAD FINDINGS Anterior circulation: The internal carotid arteries are patent from skullbase to carotid termini with moderate atherosclerotic plaque in the siphons bilaterally. There is mild left ICA stenosis near the petrous - cavernous junction, and there is moderate right supraclinoid ICA stenosis. A 1.5 mm outpouching from the right supraclinoid ICA in the posterior communicating region is favored to represent an infundibulum rather than aneurysm. The right MCA is patent with a mild M1 stenosis.  The left M1 segment is widely patent, however there is a 3 mm long occlusion (or possibly high-grade stenosis) involving the M2 inferior division origin. The ACAs are patent without evidence of significant proximal stenosis. Posterior circulation: The intracranial vertebral arteries are widely patent to the basilar. Patent PICA, AICA, and SCA origins are identified bilaterally. The basilar artery is patent with mild luminal irregularity but no significant stenosis. Posterior communicating arteries are diminutive or absent. PCAs are patent with mild atherosclerotic irregularity bilaterally but no significant proximal stenosis. There is a moderate distal left P2 stenosis. No aneurysm.  Venous sinuses: Patent. Anatomic variants: None. Delayed phase: No abnormal enhancement. Review of the MIP images confirms the above findings IMPRESSION: 1. Cervical carotid artery atherosclerosis without significant stenosis. 2. Widely patent vertebral arteries. 3. Intracranial atherosclerosis with mild left and moderate right ICA stenosis. 4. Short segment proximal occlusion of the left M2 inferior division. Electronically Signed   By: Logan Bores M.D.   On: 10/13/2016 11:20   Ct Head Wo Contrast  Result Date: 10/22/2016 CLINICAL DATA:  83 y/o  F; episode of slurred speech this afternoon. EXAM: CT HEAD WITHOUT CONTRAST TECHNIQUE: Contiguous axial images were obtained from the base of the skull through the vertex without intravenous contrast. COMPARISON:  10/20/2016 MRI of the head.  10/19/2016 CT of the head. FINDINGS: Brain: No evidence of acute infarction, hemorrhage, hydrocephalus, extra-axial collection or mass lesion/mass effect. Stable chronic microvascular ischemic changes and parenchymal volume loss of the brain. Vascular: Calcific atherosclerosis of carotid siphons. No hyperdense vessel identified. Skull: Normal. Negative for fracture or focal lesion. Sinuses/Orbits: No acute finding. Other: None. IMPRESSION: 1. No acute intracranial abnormality identified. 2. Stable chronic microvascular ischemic changes and parenchymal volume loss of the brain. Electronically Signed   By: Kristine Garbe M.D.   On: 10/22/2016 17:16   Ct Head Wo Contrast  Result Date: 10/19/2016 CLINICAL DATA:  Fall with pain. EXAM: CT HEAD WITHOUT CONTRAST TECHNIQUE: Contiguous axial images were obtained from the base of the skull through the vertex without intravenous contrast. COMPARISON:  October 13, 2016 FINDINGS: Brain: No subdural, epidural, or subarachnoid hemorrhages identified. Cerebellum, brainstem, and basal cisterns are normal. Ventricles and sulci are unchanged. Scattered white matter changes again identified.  No acute cortical ischemia or infarct. No mass effect or midline shift. Vascular: Calcified atherosclerosis is seen in the intracranial carotid arteries. Skull: Normal. Negative for fracture or focal lesion. Sinuses/Orbits: No acute finding. Other: None. IMPRESSION: No acute intracranial abnormality.  Chronic white matter changes. Electronically Signed   By: Dorise Bullion III M.D   On: 10/19/2016 20:20   Ct Head Wo Contrast  Result Date: 10/13/2016 CLINICAL DATA:  Patient c/o generalized weakness for 1 week. Hx of dizziness worsening over a week, patient states that she cannot walk without a walker due to her worsening dizziness. Patient c/o increasing blood pressures, however this is bein.*comment was truncated* EXAM: CT HEAD WITHOUT CONTRAST TECHNIQUE: Contiguous axial images were obtained from the base of the skull through the vertex without intravenous contrast. COMPARISON:  Head CT 510 13 FINDINGS: Brain: No acute intracranial hemorrhage. No focal mass lesion. No CT evidence of acute infarction. No midline shift or mass effect. No hydrocephalus. Basilar cisterns are patent. Vascular: No hyperdense vessel or unexpected calcification. Skull: Normal. Negative for fracture or focal lesion. Sinuses/Orbits: Paranasal sinuses and mastoid air cells are clear. Orbits are clear. Other: None. IMPRESSION: 1. No acute intracranial findings. 2. Atrophy and white matter microvascular disease. Electronically Signed  By: Suzy Bouchard M.D.   On: 10/13/2016 09:46   Ct Angio Neck W And/or Wo Contrast  Result Date: 10/13/2016 CLINICAL DATA:  Worsening dizziness over the past week. EXAM: CT ANGIOGRAPHY HEAD AND NECK TECHNIQUE: Multidetector CT imaging of the head and neck was performed using the standard protocol during bolus administration of intravenous contrast. Multiplanar CT image reconstructions and MIPs were obtained to evaluate the vascular anatomy. Carotid stenosis measurements (when applicable) are obtained  utilizing NASCET criteria, using the distal internal carotid diameter as the denominator. CONTRAST:  75 mL Isovue 370 COMPARISON:  Carotid Doppler ultrasound 08/25/2016. Head MRI 07/26/2016. Neck MRA 11/23/2009. Chest CT 05/31/2015. FINDINGS: CTA NECK FINDINGS Aortic arch: Standard 3 vessel aortic arch with extensive calcified and soft plaque. No significant brachiocephalic or right subclavian artery stenosis. There is mild left subclavian artery stenosis proximally due to predominantly calcified plaque. Right carotid system: Patent with mild calcified plaque in the proximal ICA not resulting in significant stenosis. Left carotid system: Patent with mild calcified and soft plaque in the proximal ICA not resulting in significant stenosis. Vertebral arteries: Patent and codominant. Calcified plaque at both vertebral artery origins does not result in significant stenosis. Skeleton: Advanced cervical disc and facet degeneration. Grade 1 anterolisthesis of C3 on C4 and C4 on C5. Other neck: Diffusely heterogeneous thyroid gland containing multiple nodules, including a dominant 2.1 cm left lobe nodule which has been previously evaluated by ultrasound. Upper chest: Unchanged 2.7 x 1.2 cm right upper lobe lung nodule. Review of the MIP images confirms the above findings CTA HEAD FINDINGS Anterior circulation: The internal carotid arteries are patent from skullbase to carotid termini with moderate atherosclerotic plaque in the siphons bilaterally. There is mild left ICA stenosis near the petrous - cavernous junction, and there is moderate right supraclinoid ICA stenosis. A 1.5 mm outpouching from the right supraclinoid ICA in the posterior communicating region is favored to represent an infundibulum rather than aneurysm. The right MCA is patent with a mild M1 stenosis. The left M1 segment is widely patent, however there is a 3 mm long occlusion (or possibly high-grade stenosis) involving the M2 inferior division origin. The  ACAs are patent without evidence of significant proximal stenosis. Posterior circulation: The intracranial vertebral arteries are widely patent to the basilar. Patent PICA, AICA, and SCA origins are identified bilaterally. The basilar artery is patent with mild luminal irregularity but no significant stenosis. Posterior communicating arteries are diminutive or absent. PCAs are patent with mild atherosclerotic irregularity bilaterally but no significant proximal stenosis. There is a moderate distal left P2 stenosis. No aneurysm. Venous sinuses: Patent. Anatomic variants: None. Delayed phase: No abnormal enhancement. Review of the MIP images confirms the above findings IMPRESSION: 1. Cervical carotid artery atherosclerosis without significant stenosis. 2. Widely patent vertebral arteries. 3. Intracranial atherosclerosis with mild left and moderate right ICA stenosis. 4. Short segment proximal occlusion of the left M2 inferior division. Electronically Signed   By: Logan Bores M.D.   On: 10/13/2016 11:20   Mr Brain Wo Contrast  Result Date: 10/20/2016 CLINICAL DATA:  Dizziness.  Known CVA. EXAM: MRI HEAD WITHOUT CONTRAST TECHNIQUE: Multiplanar, multiecho pulse sequences of the brain and surrounding structures were obtained without intravenous contrast. COMPARISON:  07/26/2016 brain MRI FINDINGS: Brain: No acute infarction, hemorrhage, hydrocephalus, extra-axial collection or mass lesion. Chronic small vessel ischemia with stable pattern of periventricular predominant cerebral involvement. Mild for age generalized cerebral volume loss. Remote small vessel infarct in the right cerebellum. Vascular: Major flow  voids are preserved. Skull and upper cervical spine: Negative for marrow lesion. Sinuses/Orbits: No acute finding IMPRESSION: 1. No acute finding or change from April 2018. 2.  Chronic small vessel ischemia. Electronically Signed   By: Monte Fantasia M.D.   On: 10/20/2016 13:36   Dg Hip Unilat W Or W/o Pelvis  2-3 Views Left  Result Date: 10/19/2016 CLINICAL DATA:  Left hip pain after fall. EXAM: DG HIP (WITH OR WITHOUT PELVIS) 2-3V LEFT COMPARISON:  No comparison studies available. FINDINGS: Bones are demineralized. No evidence for fracture. No dislocation. SI joints and symphysis pubis are unremarkable. IMPRESSION: No acute bony abnormality. Electronically Signed   By: Misty Stanley M.D.   On: 10/19/2016 19:07    Micro Results     No results found for this or any previous visit (from the past 240 hour(s)).     Today   Subjective:   Shameika Speelman today Is stable for discharge.  Objective:   Blood pressure (!) 160/59, pulse 78, temperature (!) 97.5 F (36.4 C), temperature source Oral, resp. rate 18, height 5\' 1"  (1.549 m), weight 52.8 kg (116 lb 4.8 oz), SpO2 98 %.   Intake/Output Summary (Last 24 hours) at 10/23/16 0848 Last data filed at 10/22/16 2000  Gross per 24 hour  Intake              600 ml  Output                0 ml  Net              600 ml    Exam Awake Alert, Oriented x 3, No new F.N deficits, Normal affect [place].AT,PERRAL Supple Neck,No JVD, No cervical lymphadenopathy appriciated.  Symmetrical Chest wall movement, Good air movement bilaterally, CTAB RRR,No Gallops,Rubs or new Murmurs, No Parasternal Heave +ve B.Sounds, Abd Soft, Non tender, No organomegaly appriciated, No rebound -guarding or rigidity. No Cyanosis, Clubbing or edema, No new Rash or bruise  Data Review   CBC w Diff: Lab Results  Component Value Date   WBC 6.3 10/20/2016   HGB 14.0 10/20/2016   HGB 15.2 07/26/2013   HCT 41.0 10/20/2016   HCT 47.7 (H) 07/26/2013   PLT 172 10/20/2016   PLT 181 07/26/2013   LYMPHOPCT 14 10/19/2016   MONOPCT 6 10/19/2016   EOSPCT 1 10/19/2016   BASOPCT 1 10/19/2016    CMP: Lab Results  Component Value Date   NA 136 10/20/2016   NA 140 07/26/2013   K 3.9 10/20/2016   K 4.4 07/26/2013   CL 104 10/20/2016   CL 107 07/26/2013   CO2 27 10/20/2016   CO2 28  07/26/2013   BUN 10 10/20/2016   BUN 21 (H) 07/26/2013   CREATININE 0.85 10/23/2016   CREATININE 0.95 07/26/2013   PROT 6.7 10/19/2016   PROT 7.2 07/26/2013   ALBUMIN 3.8 10/19/2016   ALBUMIN 3.6 07/26/2013   BILITOT 0.6 10/19/2016   BILITOT 0.4 07/26/2013   ALKPHOS 57 10/19/2016   ALKPHOS 79 07/26/2013   AST 17 10/19/2016   AST 9 (L) 07/26/2013   ALT 14 10/19/2016   ALT 23 07/26/2013  .   Total Time in preparing paper work, data evaluation and todays exam - 68 minutes  Nickolaos Brallier M.D on 10/23/2016 at 8:48 AM    Note: This dictation was prepared with Dragon dictation along with smaller phrase technology. Any transcriptional errors that result from this process are unintentional.

## 2016-10-25 ENCOUNTER — Non-Acute Institutional Stay (SKILLED_NURSING_FACILITY): Payer: Medicare Other | Admitting: Gerontology

## 2016-10-25 ENCOUNTER — Other Ambulatory Visit: Payer: Self-pay | Admitting: Gerontology

## 2016-10-25 DIAGNOSIS — I158 Other secondary hypertension: Secondary | ICD-10-CM | POA: Diagnosis not present

## 2016-10-25 DIAGNOSIS — R2681 Unsteadiness on feet: Secondary | ICD-10-CM

## 2016-10-25 DIAGNOSIS — R531 Weakness: Secondary | ICD-10-CM

## 2016-10-25 DIAGNOSIS — M79606 Pain in leg, unspecified: Secondary | ICD-10-CM

## 2016-10-26 ENCOUNTER — Encounter: Payer: Self-pay | Admitting: Gerontology

## 2016-10-26 NOTE — Progress Notes (Signed)
Location:   [facility] Room Number: 210A Place of Service:  SNF [phone]) Provider:  Toni Arthurs, NP-C  Tracie Harrier, MD  Patient Care Team: Tracie Harrier, MD as PCP - General (Internal Medicine) Minna Merritts, MD as Consulting Physician (Cardiology)  Extended Emergency Contact Information Primary Emergency Contact: Craig of [place] Phone: [phone] Mobile Phone: [phone] Relation: Daughter  Code Status:  DNR  Goals of care: Advanced Directive information Advanced Directives 10/25/2016  Does Patient Have a Medical Advance Directive? Yes  Type of Advance Directive Out of facility DNR (pink MOST or yellow form)  Does patient want to make changes to medical advance directive? No - Patient declined  Copy of [place] in Chart? -  Would patient like information on creating a medical advance directive? -     Chief Complaint  Patient presents with  . Medical Management of Chronic Issues    Routine Visit    HPI:  Pt is a 83 y.o. female seen today for medical management of chronic diseases. Pt was recently admitted to the facility for rehab for weakness, dizziness/ fall with left hip pain. Pt is concerned that she is not receiving her home regimen of antihypertensives. They were d/c'ed by MD d/t concern for them being the source of the falls. Assured pt her vitals were stable and are being monitored. Pt is working with PT and OT while here for the weakness and instability. Otherwise, pt reports she is feeling fine. Appetite ok. VSS. No other complaints.      Past Medical History:  Diagnosis Date  . Arthritis   . Basal cell carcinoma    basel cell  . Chronic cystitis   . Diabetes mellitus type 2, controlled, without complications ([place])    type 2 or unspecified type diabetes mellitus without mention of complication, not stated as uncontrolled  . Disease of thyroid gland   . Embolic stroke  ([place])    a. 07/2016 MRI Brain: small right superior cerebellum and left lateral occipital cortex infarcts;  b. 08/2016 Echo: EF 60-65%, Gr1 DD, mild MR;  c. 08/2016 30 Day Event Monitor: No significant tachy/bradyarrhythmias. No afib.  . Follicular neoplasm of thyroid   . Hemorrhage of rectum and anus   . Hyperlipidemia   . Hyperlipidemia, unspecified   . Hypertension   . Leg pain   . Osteoarthritis   . Pernicious anemia   . Small vessel disease   . Spinal stenosis, lumbar region without neurogenic claudication   . Subdural hematoma ([place])    a. 2013 in setting of syncope.  . Syncope and collapse    a. 2013 - syncope and fall w/ resultant subdural hematoma; b. prev nl echo and stress test @ [facility].  Marland Kitchen Thyroid nodule   . Vertigo & Chronic Dizziness    a. Previously seen by ENT with Physical Therapy.  MRI 2000 small vessel disease   Past Surgical History:  Procedure Laterality Date  . BILATERAL SALPINGOOPHORECTOMY    . COLONOSCOPY  01/26/2009   Diverticulosis  . CYSTOSCOPY    . HEMORRHOIDECTOMY BY SIMPLE LIGATION    . PR COLONOSCOPY FLX W/ENDOSCOPIC MUCOSAL RESECTION  09/02/2014   Procedure: COLONOSCOPY, FLEXIBLE; WITH ENDOSCOPIC MUCOSAL RESECTION; Surgeon: Saunders Revel, MD; [facility]; Service: Gastroenterology  . PR COLSC FLX W/RMVL OF TUMOR POLYP LESION SNARE TQ  09/02/2014   Procedure: COLONOSCOPY FLEX; W/REMOV TUMOR/LES BY SNARE; Surgeon: Saunders Revel,  MD; Location: [facility]; Service: Gastroenterology  . SKIN SURGERY    . TOTAL ABDOMINAL HYSTERECTOMY W/ BILATERAL SALPINGOOPHORECTOMY  1970s    Allergies  Allergen Reactions  . Hydrocortisone Swelling  . Sulfa Antibiotics     Allergies as of 10/25/2016      Reactions   Hydrocortisone Swelling   Sulfa Antibiotics       Medication List       Accurate as of 10/25/16 11:59 PM. Always use your most recent med list.          CENTRUM SILVER PO Take 1 tablet by  mouth daily. Reported on 06/11/2015   clopidogrel 75 MG tablet Commonly known as:  PLAVIX Take 75 mg by mouth daily.   meclizine 12.5 MG tablet Commonly known as:  ANTIVERT Take 1 tablet (12.5 mg total) by mouth 3 (three) times daily as needed for dizziness.   metoprolol tartrate 25 MG tablet Commonly known as:  LOPRESSOR Take 12.5 mg by mouth 2 (two) times daily.   rosuvastatin 10 MG tablet Commonly known as:  CRESTOR Take 10 mg by mouth daily.       Review of Systems  Constitutional: Negative for activity change, appetite change, chills, diaphoresis and fever.  HENT: Negative for congestion, sneezing, sore throat, trouble swallowing and voice change.   Respiratory: Negative for apnea, cough, choking, chest tightness, shortness of breath and wheezing.   Cardiovascular: Negative for chest pain, palpitations and leg swelling.  Gastrointestinal: Negative for abdominal distention, abdominal pain, constipation, diarrhea and nausea.  Genitourinary: Negative for difficulty urinating, dysuria, frequency and urgency.  Musculoskeletal: Negative for back pain, gait problem and myalgias. Arthralgias: typical arthritis.  Skin: Negative for color change, pallor, rash and wound.  Neurological: Positive for weakness. Negative for dizziness, tremors, syncope, speech difficulty, numbness and headaches.  Psychiatric/Behavioral: Negative for agitation and behavioral problems.  All other systems reviewed and are negative.   Immunization History  Administered Date(s) Administered  . Pneumococcal Polysaccharide-23 06/09/2011  . Zoster 07/20/2011   Pertinent  Health Maintenance Due  Topic Date Due  . HEMOGLOBIN A1C  12-04-26  . FOOT EXAM  11/13/1934  . OPHTHALMOLOGY EXAM  11/13/1936  . DEXA SCAN  11/14/1991  . PNA vac Low Risk Adult (1 of 2 - PCV13) 11/14/1991  . INFLUENZA VACCINE  11/15/2016   No flowsheet data found. Functional Status Survey:    Vitals:   10/25/16 1228  BP: (!)  117/49  Pulse: 72  Resp: 18  Temp: 97.6 F (36.4 C)  SpO2: 98%  Height: 5\' 1"  (1.549 m)   There is no height or weight on file to calculate BMI. Physical Exam  Constitutional: She is oriented to person, place, and time. Vital signs are normal. She appears well-developed and well-nourished. She is active and cooperative. She does not appear ill. No distress.  HENT:  Head: Normocephalic and atraumatic.  Mouth/Throat: Uvula is midline, oropharynx is clear and moist and mucous membranes are normal. Mucous membranes are not pale, not dry and not cyanotic.  Eyes: Pupils are equal, round, and reactive to light. Conjunctivae, EOM and lids are normal.  Neck: Trachea normal, normal range of motion and full passive range of motion without pain. Neck supple. No JVD present. No tracheal deviation, no edema and no erythema present. No thyromegaly present.  Cardiovascular: Normal rate, regular rhythm, normal heart sounds, intact distal pulses and normal pulses.  Exam reveals no gallop, no distant heart sounds and no friction rub.   No  murmur heard. Pulses:      Dorsalis pedis pulses are 2+ on the right side, and 2+ on the left side.  No edema  Pulmonary/Chest: Effort normal and breath sounds normal. No accessory muscle usage. No respiratory distress. She has no wheezes. She has no rales. She exhibits no tenderness.  Abdominal: Normal appearance and bowel sounds are normal. She exhibits no distension and no ascites. There is no tenderness.  Musculoskeletal: Normal range of motion. She exhibits no edema or tenderness.  Expected osteoarthritis, stiffness  Neurological: She is alert and oriented to person, place, and time. She has normal strength. Gait abnormal.  Skin: Skin is warm, dry and intact. No rash noted. She is not diaphoretic. No cyanosis or erythema. No pallor. Nails show no clubbing.  Psychiatric: She has a normal mood and affect. Her speech is normal and behavior is normal. Judgment and thought  content normal. Cognition and memory are normal.  Nursing note and vitals reviewed.   Labs reviewed:  Recent Labs  10/13/16 0858 10/19/16 1821 10/20/16 0448 10/23/16 0556  NA 136 132* 136  --   K 4.3 4.9 3.9  --   CL 102 97* 104  --   CO2 25 27 27   --   GLUCOSE 191* 159* 126*  --   BUN 13 11 10   --   CREATININE 0.79 0.53 0.56 0.85  CALCIUM 9.4 9.3 8.9  --   MG  --  1.8  --   --   PHOS  --  3.1  --   --     Recent Labs  10/13/16 0858 10/19/16 1821  AST 25 17  ALT 18 14  ALKPHOS 61 57  BILITOT 0.8 0.6  PROT 7.1 6.7  ALBUMIN 4.0 3.8    Recent Labs  10/13/16 0858 10/19/16 1821 10/20/16 0448  WBC 6.4 8.9 6.3  NEUTROABS 4.0 7.0*  --   HGB 15.9 14.9 14.0  HCT 46.9 44.3 41.0  MCV 84.6 84.9 84.7  PLT 199 193 172   Lab Results  Component Value Date   TSH 4.261 10/19/2016   No results found for: HGBA1C No results found for: CHOL, HDL, LDLCALC, LDLDIRECT, TRIG, CHOLHDL  Significant Diagnostic Results in last 30 days:  Ct Angio Head W Or Wo Contrast  Result Date: 10/13/2016 CLINICAL DATA:  Worsening dizziness over the past week. EXAM: CT ANGIOGRAPHY HEAD AND NECK TECHNIQUE: Multidetector CT imaging of the head and neck was performed using the standard protocol during bolus administration of intravenous contrast. Multiplanar CT image reconstructions and MIPs were obtained to evaluate the vascular anatomy. Carotid stenosis measurements (when applicable) are obtained utilizing NASCET criteria, using the distal internal carotid diameter as the denominator. CONTRAST:  75 mL Isovue 370 COMPARISON:  Carotid Doppler ultrasound 08/25/2016. Head MRI 07/26/2016. Neck MRA 11/23/2009. Chest CT 05/31/2015. FINDINGS: CTA NECK FINDINGS Aortic arch: Standard 3 vessel aortic arch with extensive calcified and soft plaque. No significant brachiocephalic or right subclavian artery stenosis. There is mild left subclavian artery stenosis proximally due to predominantly calcified plaque. Right  carotid system: Patent with mild calcified plaque in the proximal ICA not resulting in significant stenosis. Left carotid system: Patent with mild calcified and soft plaque in the proximal ICA not resulting in significant stenosis. Vertebral arteries: Patent and codominant. Calcified plaque at both vertebral artery origins does not result in significant stenosis. Skeleton: Advanced cervical disc and facet degeneration. Grade 1 anterolisthesis of C3 on C4 and C4 on C5. Other neck: Diffusely heterogeneous  thyroid gland containing multiple nodules, including a dominant 2.1 cm left lobe nodule which has been previously evaluated by ultrasound. Upper chest: Unchanged 2.7 x 1.2 cm right upper lobe lung nodule. Review of the MIP images confirms the above findings CTA HEAD FINDINGS Anterior circulation: The internal carotid arteries are patent from skullbase to carotid termini with moderate atherosclerotic plaque in the siphons bilaterally. There is mild left ICA stenosis near the petrous - cavernous junction, and there is moderate right supraclinoid ICA stenosis. A 1.5 mm outpouching from the right supraclinoid ICA in the posterior communicating region is favored to represent an infundibulum rather than aneurysm. The right MCA is patent with a mild M1 stenosis. The left M1 segment is widely patent, however there is a 3 mm long occlusion (or possibly high-grade stenosis) involving the M2 inferior division origin. The ACAs are patent without evidence of significant proximal stenosis. Posterior circulation: The intracranial vertebral arteries are widely patent to the basilar. Patent PICA, AICA, and SCA origins are identified bilaterally. The basilar artery is patent with mild luminal irregularity but no significant stenosis. Posterior communicating arteries are diminutive or absent. PCAs are patent with mild atherosclerotic irregularity bilaterally but no significant proximal stenosis. There is a moderate distal left P2  stenosis. No aneurysm. Venous sinuses: Patent. Anatomic variants: None. Delayed phase: No abnormal enhancement. Review of the MIP images confirms the above findings IMPRESSION: 1. Cervical carotid artery atherosclerosis without significant stenosis. 2. Widely patent vertebral arteries. 3. Intracranial atherosclerosis with mild left and moderate right ICA stenosis. 4. Short segment proximal occlusion of the left M2 inferior division. Electronically Signed   By: Logan Bores M.D.   On: 10/13/2016 11:20   Ct Head Wo Contrast  Result Date: 10/22/2016 CLINICAL DATA:  83 y/o  F; episode of slurred speech this afternoon. EXAM: CT HEAD WITHOUT CONTRAST TECHNIQUE: Contiguous axial images were obtained from the base of the skull through the vertex without intravenous contrast. COMPARISON:  10/20/2016 MRI of the head.  10/19/2016 CT of the head. FINDINGS: Brain: No evidence of acute infarction, hemorrhage, hydrocephalus, extra-axial collection or mass lesion/mass effect. Stable chronic microvascular ischemic changes and parenchymal volume loss of the brain. Vascular: Calcific atherosclerosis of carotid siphons. No hyperdense vessel identified. Skull: Normal. Negative for fracture or focal lesion. Sinuses/Orbits: No acute finding. Other: None. IMPRESSION: 1. No acute intracranial abnormality identified. 2. Stable chronic microvascular ischemic changes and parenchymal volume loss of the brain. Electronically Signed   By: Kristine Garbe M.D.   On: 10/22/2016 17:16   Ct Head Wo Contrast  Result Date: 10/19/2016 CLINICAL DATA:  Fall with pain. EXAM: CT HEAD WITHOUT CONTRAST TECHNIQUE: Contiguous axial images were obtained from the base of the skull through the vertex without intravenous contrast. COMPARISON:  October 13, 2016 FINDINGS: Brain: No subdural, epidural, or subarachnoid hemorrhages identified. Cerebellum, brainstem, and basal cisterns are normal. Ventricles and sulci are unchanged. Scattered white matter  changes again identified. No acute cortical ischemia or infarct. No mass effect or midline shift. Vascular: Calcified atherosclerosis is seen in the intracranial carotid arteries. Skull: Normal. Negative for fracture or focal lesion. Sinuses/Orbits: No acute finding. Other: None. IMPRESSION: No acute intracranial abnormality.  Chronic white matter changes. Electronically Signed   By: Dorise Bullion III M.D   On: 10/19/2016 20:20   Ct Head Wo Contrast  Result Date: 10/13/2016 CLINICAL DATA:  Patient c/o generalized weakness for 1 week. Hx of dizziness worsening over a week, patient states that she cannot walk without a  walker due to her worsening dizziness. Patient c/o increasing blood pressures, however this is bein.*comment was truncated* EXAM: CT HEAD WITHOUT CONTRAST TECHNIQUE: Contiguous axial images were obtained from the base of the skull through the vertex without intravenous contrast. COMPARISON:  Head CT 510 13 FINDINGS: Brain: No acute intracranial hemorrhage. No focal mass lesion. No CT evidence of acute infarction. No midline shift or mass effect. No hydrocephalus. Basilar cisterns are patent. Vascular: No hyperdense vessel or unexpected calcification. Skull: Normal. Negative for fracture or focal lesion. Sinuses/Orbits: Paranasal sinuses and mastoid air cells are clear. Orbits are clear. Other: None. IMPRESSION: 1. No acute intracranial findings. 2. Atrophy and white matter microvascular disease. Electronically Signed   By: Suzy Bouchard M.D.   On: 10/13/2016 09:46   Ct Angio Neck W And/or Wo Contrast  Result Date: 10/13/2016 CLINICAL DATA:  Worsening dizziness over the past week. EXAM: CT ANGIOGRAPHY HEAD AND NECK TECHNIQUE: Multidetector CT imaging of the head and neck was performed using the standard protocol during bolus administration of intravenous contrast. Multiplanar CT image reconstructions and MIPs were obtained to evaluate the vascular anatomy. Carotid stenosis measurements (when  applicable) are obtained utilizing NASCET criteria, using the distal internal carotid diameter as the denominator. CONTRAST:  75 mL Isovue 370 COMPARISON:  Carotid Doppler ultrasound 08/25/2016. Head MRI 07/26/2016. Neck MRA 11/23/2009. Chest CT 05/31/2015. FINDINGS: CTA NECK FINDINGS Aortic arch: Standard 3 vessel aortic arch with extensive calcified and soft plaque. No significant brachiocephalic or right subclavian artery stenosis. There is mild left subclavian artery stenosis proximally due to predominantly calcified plaque. Right carotid system: Patent with mild calcified plaque in the proximal ICA not resulting in significant stenosis. Left carotid system: Patent with mild calcified and soft plaque in the proximal ICA not resulting in significant stenosis. Vertebral arteries: Patent and codominant. Calcified plaque at both vertebral artery origins does not result in significant stenosis. Skeleton: Advanced cervical disc and facet degeneration. Grade 1 anterolisthesis of C3 on C4 and C4 on C5. Other neck: Diffusely heterogeneous thyroid gland containing multiple nodules, including a dominant 2.1 cm left lobe nodule which has been previously evaluated by ultrasound. Upper chest: Unchanged 2.7 x 1.2 cm right upper lobe lung nodule. Review of the MIP images confirms the above findings CTA HEAD FINDINGS Anterior circulation: The internal carotid arteries are patent from skullbase to carotid termini with moderate atherosclerotic plaque in the siphons bilaterally. There is mild left ICA stenosis near the petrous - cavernous junction, and there is moderate right supraclinoid ICA stenosis. A 1.5 mm outpouching from the right supraclinoid ICA in the posterior communicating region is favored to represent an infundibulum rather than aneurysm. The right MCA is patent with a mild M1 stenosis. The left M1 segment is widely patent, however there is a 3 mm long occlusion (or possibly high-grade stenosis) involving the M2  inferior division origin. The ACAs are patent without evidence of significant proximal stenosis. Posterior circulation: The intracranial vertebral arteries are widely patent to the basilar. Patent PICA, AICA, and SCA origins are identified bilaterally. The basilar artery is patent with mild luminal irregularity but no significant stenosis. Posterior communicating arteries are diminutive or absent. PCAs are patent with mild atherosclerotic irregularity bilaterally but no significant proximal stenosis. There is a moderate distal left P2 stenosis. No aneurysm. Venous sinuses: Patent. Anatomic variants: None. Delayed phase: No abnormal enhancement. Review of the MIP images confirms the above findings IMPRESSION: 1. Cervical carotid artery atherosclerosis without significant stenosis. 2. Widely patent vertebral arteries. 3.  Intracranial atherosclerosis with mild left and moderate right ICA stenosis. 4. Short segment proximal occlusion of the left M2 inferior division. Electronically Signed   By: Logan Bores M.D.   On: 10/13/2016 11:20   Mr Brain Wo Contrast  Result Date: 10/20/2016 CLINICAL DATA:  Dizziness.  Known CVA. EXAM: MRI HEAD WITHOUT CONTRAST TECHNIQUE: Multiplanar, multiecho pulse sequences of the brain and surrounding structures were obtained without intravenous contrast. COMPARISON:  07/26/2016 brain MRI FINDINGS: Brain: No acute infarction, hemorrhage, hydrocephalus, extra-axial collection or mass lesion. Chronic small vessel ischemia with stable pattern of periventricular predominant cerebral involvement. Mild for age generalized cerebral volume loss. Remote small vessel infarct in the right cerebellum. Vascular: Major flow voids are preserved. Skull and upper cervical spine: Negative for marrow lesion. Sinuses/Orbits: No acute finding IMPRESSION: 1. No acute finding or change from April 2018. 2.  Chronic small vessel ischemia. Electronically Signed   By: Monte Fantasia M.D.   On: 10/20/2016 13:36    Dg Hip Unilat W Or W/o Pelvis 2-3 Views Left  Result Date: 10/19/2016 CLINICAL DATA:  Left hip pain after fall. EXAM: DG HIP (WITH OR WITHOUT PELVIS) 2-3V LEFT COMPARISON:  No comparison studies available. FINDINGS: Bones are demineralized. No evidence for fracture. No dislocation. SI joints and symphysis pubis are unremarkable. IMPRESSION: No acute bony abnormality. Electronically Signed   By: Misty Stanley M.D.   On: 10/19/2016 19:07    Assessment/Plan 1. Gait Instability  Continue working with PT/OT  Continue exercises as taught by PT/OT  Mobilize on unit with W/C when unassisted  2. Other secondary hypertension  Antihypertensives d/c'ed d/t dizziness  Monitor B/Ps  No tx for now  Family/ staff Communication:   Total Time:  Documentation:  Face to Face:  Family/Phone:   Labs/tests ordered:  MRI- lumbar spine (ordered by Dr Ouida Sills)  Medication list reviewed and assessed for continued appropriateness. Monthly medication orders reviewed and signed.  Vikki Ports, NP-C Geriatrics [facility] [phone], [place] [address] Cell Phone (Mon-Fri 8am-5pm):  [phone] On Call:  [phone] & follow prompts after 5pm & weekends Office Phone:  [phone] Office Fax:  [phone]

## 2016-10-27 ENCOUNTER — Non-Acute Institutional Stay (SKILLED_NURSING_FACILITY): Payer: Medicare Other | Admitting: Gerontology

## 2016-10-27 DIAGNOSIS — I158 Other secondary hypertension: Secondary | ICD-10-CM

## 2016-10-27 DIAGNOSIS — R4781 Slurred speech: Secondary | ICD-10-CM

## 2016-10-27 DIAGNOSIS — R2681 Unsteadiness on feet: Secondary | ICD-10-CM

## 2016-10-30 ENCOUNTER — Ambulatory Visit: Payer: Medicare Other

## 2016-10-30 ENCOUNTER — Encounter: Payer: Self-pay | Admitting: Gerontology

## 2016-10-30 NOTE — Progress Notes (Signed)
Location:   [facility] Room Number: 210A Place of Service:  SNF [phone]) Provider:  Toni Arthurs, NP-C  Tracie Harrier, MD  Patient Care Team: Tracie Harrier, MD as PCP - General (Internal Medicine) Minna Merritts, MD as Consulting Physician (Cardiology)  Extended Emergency Contact Information Primary Emergency Contact: [place] Phone: [phone] Mobile Phone: [phone] Relation: Daughter  Code Status:  DNR Goals of care: Advanced Directive information Advanced Directives 10/27/2016  Does Patient Have a Medical Advance Directive? Yes  Type of Advance Directive Out of facility DNR (pink MOST or yellow form)  Does patient want to make changes to medical advance directive? No - Patient declined  Copy of Melissa in Chart? -  Would patient like information on creating a medical advance directive? -     Chief Complaint  Patient presents with  . Acute Visit    Follow up on slurred speech    HPI:  Pt is a 83 y.o. female seen today for an acute visit for c/o slurred speech. Pt reports her speech started slurring earlier in the morning, but has since improved. She was not slurring every word, but an occasional word in a conversation. Pt reports at the time of the interview, the speech had improved. I explained to pt that even if it was a TIA, there was no treatment and no testing to detect it. Assured pt she would be monitored as there are no longer any symptoms. I also assured her I would review her labs and scans for any clues. Assured pt her Vitals were stable, her assessment was negative, she did not have any neurological deficits that alerted me to a CVA, etc. Pt seemed satisfied with this as pt has no other complaints. VSS.    Past Medical History:  Diagnosis Date  . Arthritis   . Basal cell carcinoma    basel cell  . Chronic cystitis   . Diabetes mellitus type 2, controlled,  without complications ([place])    type 2 or unspecified type diabetes mellitus without mention of complication, not stated as uncontrolled  . Disease of thyroid gland   . Embolic stroke ([place])    a. 07/2016 MRI Brain: small right superior cerebellum and left lateral occipital cortex infarcts;  b. 08/2016 Echo: EF 60-65%, Gr1 DD, mild MR;  c. 08/2016 30 Day Event Monitor: No significant tachy/bradyarrhythmias. No afib.  . Follicular neoplasm of thyroid   . Hemorrhage of rectum and anus   . Hyperlipidemia   . Hyperlipidemia, unspecified   . Hypertension   . Leg pain   . Osteoarthritis   . Pernicious anemia   . Small vessel disease   . Spinal stenosis, lumbar region without neurogenic claudication   . Subdural hematoma ([place])    a. 2013 in setting of syncope.  . Syncope and collapse    a. 2013 - syncope and fall w/ resultant subdural hematoma; b. prev nl echo and stress test @ [facility].  Marland Kitchen Thyroid nodule   . Vertigo & Chronic Dizziness    a. Previously seen by ENT with Physical Therapy.  MRI 2000 small vessel disease   Past Surgical History:  Procedure Laterality Date  . BILATERAL SALPINGOOPHORECTOMY    . COLONOSCOPY  01/26/2009   Diverticulosis  . CYSTOSCOPY    . HEMORRHOIDECTOMY BY SIMPLE LIGATION    . PR COLONOSCOPY FLX W/ENDOSCOPIC MUCOSAL RESECTION  09/02/2014   Procedure: COLONOSCOPY, FLEXIBLE; WITH ENDOSCOPIC MUCOSAL  RESECTION; Surgeon: Saunders Revel, MD; [facility]; Service: Gastroenterology  . PR COLSC FLX W/RMVL OF TUMOR POLYP LESION SNARE TQ  09/02/2014   Procedure: COLONOSCOPY FLEX; W/REMOV TUMOR/LES BY SNARE; Surgeon: Saunders Revel, MD; Location: [facility]; Service: Gastroenterology  . SKIN SURGERY    . TOTAL ABDOMINAL HYSTERECTOMY W/ BILATERAL SALPINGOOPHORECTOMY  1970s    Allergies  Allergen Reactions  . Hydrocortisone Swelling  . Sulfa Antibiotics     Allergies as of 10/27/2016      Reactions   Hydrocortisone  Swelling   Sulfa Antibiotics       Medication List       Accurate as of 10/27/16 11:59 PM. Always use your most recent med list.          CENTRUM SILVER PO Take 1 tablet by mouth daily. Reported on 06/11/2015   clopidogrel 75 MG tablet Commonly known as:  PLAVIX Take 75 mg by mouth daily.   meclizine 12.5 MG tablet Commonly known as:  ANTIVERT Take 1 tablet (12.5 mg total) by mouth 3 (three) times daily as needed for dizziness.   metoprolol tartrate 25 MG tablet Commonly known as:  LOPRESSOR Take 12.5 mg by mouth 2 (two) times daily.   rosuvastatin 10 MG tablet Commonly known as:  CRESTOR Take 10 mg by mouth daily.       Review of Systems  Constitutional: Negative for activity change, appetite change, chills, diaphoresis and fever.  HENT: Negative for congestion, sneezing, sore throat, trouble swallowing and voice change.   Respiratory: Negative for apnea, cough, choking, chest tightness, shortness of breath and wheezing.   Cardiovascular: Negative for chest pain, palpitations and leg swelling.  Gastrointestinal: Negative for abdominal distention, abdominal pain, constipation, diarrhea and nausea.  Genitourinary: Negative for difficulty urinating, dysuria, frequency and urgency.  Musculoskeletal: Negative for back pain, gait problem and myalgias. Arthralgias: typical arthritis.  Skin: Negative for color change, pallor, rash and wound.  Neurological: Positive for weakness. Negative for dizziness, tremors, syncope, speech difficulty, numbness and headaches.  Psychiatric/Behavioral: Negative for agitation and behavioral problems.  All other systems reviewed and are negative.   Immunization History  Administered Date(s) Administered  . Pneumococcal Polysaccharide-23 06/09/2011  . Zoster 07/20/2011   Pertinent  Health Maintenance Due  Topic Date Due  . HEMOGLOBIN A1C  07/16/1926  . FOOT EXAM  11/13/1936  . OPHTHALMOLOGY EXAM  11/13/1936  . URINE MICROALBUMIN   11/13/1936  . DEXA SCAN  11/14/1991  . PNA vac Low Risk Adult (2 of 2 - PCV13) 06/08/2012  . INFLUENZA VACCINE  11/15/2016   No flowsheet data found. Functional Status Survey:    Vitals:   10/27/16 1039  BP: (!) 148/54  Pulse: 65  Resp: 18  Temp: 97.7 F (36.5 C)  SpO2: 99%  Weight: 123 lb 7.3 oz (56 kg)  Height: 5' 1.77" (1.569 m)   Body mass index is 22.75 kg/m. Physical Exam  Constitutional: She is oriented to person, place, and time. Vital signs are normal. She appears well-developed and well-nourished. She is active and cooperative. She does not appear ill. No distress.  HENT:  Head: Normocephalic and atraumatic.  Mouth/Throat: Uvula is midline, oropharynx is clear and moist and mucous membranes are normal. Mucous membranes are not pale, not dry and not cyanotic.  Eyes: Pupils are equal, round, and reactive to light. Conjunctivae, EOM and lids are normal.  Neck: Trachea normal, normal range of motion and full passive range of motion without  pain. Neck supple. No JVD present. No tracheal deviation, no edema and no erythema present. No thyromegaly present.  Cardiovascular: Normal rate, regular rhythm, normal heart sounds, intact distal pulses and normal pulses.  Exam reveals no gallop, no distant heart sounds and no friction rub.   No murmur heard. Pulses:      Dorsalis pedis pulses are 2+ on the right side, and 2+ on the left side.  No edema  Pulmonary/Chest: Effort normal and breath sounds normal. No accessory muscle usage. No respiratory distress. She has no wheezes. She has no rales. She exhibits no tenderness.  Abdominal: Normal appearance and bowel sounds are normal. She exhibits no distension and no ascites. There is no tenderness.  Musculoskeletal: Normal range of motion. She exhibits no edema or tenderness.  Expected osteoarthritis, stiffness  Neurological: She is alert and oriented to person, place, and time. She has normal strength. Gait abnormal.  Skin: Skin is  warm, dry and intact. No rash noted. She is not diaphoretic. No cyanosis or erythema. No pallor. Nails show no clubbing.  Psychiatric: She has a normal mood and affect. Her speech is normal and behavior is normal. Judgment and thought content normal. Cognition and memory are normal.  Nursing note and vitals reviewed.   Labs reviewed:  Recent Labs  10/13/16 0858 10/19/16 1821 10/20/16 0448 10/23/16 0556  NA 136 132* 136  --   K 4.3 4.9 3.9  --   CL 102 97* 104  --   CO2 25 27 27   --   GLUCOSE 191* 159* 126*  --   BUN 13 11 10   --   CREATININE 0.79 0.53 0.56 0.85  CALCIUM 9.4 9.3 8.9  --   MG  --  1.8  --   --   PHOS  --  3.1  --   --     Recent Labs  10/13/16 0858 10/19/16 1821  AST 25 17  ALT 18 14  ALKPHOS 61 57  BILITOT 0.8 0.6  PROT 7.1 6.7  ALBUMIN 4.0 3.8    Recent Labs  10/13/16 0858 10/19/16 1821 10/20/16 0448  WBC 6.4 8.9 6.3  NEUTROABS 4.0 7.0*  --   HGB 15.9 14.9 14.0  HCT 46.9 44.3 41.0  MCV 84.6 84.9 84.7  PLT 199 193 172   Lab Results  Component Value Date   TSH 4.261 10/19/2016   No results found for: HGBA1C No results found for: CHOL, HDL, LDLCALC, LDLDIRECT, TRIG, CHOLHDL  Significant Diagnostic Results in last 30 days:  Ct Angio Head W Or Wo Contrast  Result Date: 10/13/2016 CLINICAL DATA:  Worsening dizziness over the past week. EXAM: CT ANGIOGRAPHY HEAD AND NECK TECHNIQUE: Multidetector CT imaging of the head and neck was performed using the standard protocol during bolus administration of intravenous contrast. Multiplanar CT image reconstructions and MIPs were obtained to evaluate the vascular anatomy. Carotid stenosis measurements (when applicable) are obtained utilizing NASCET criteria, using the distal internal carotid diameter as the denominator. CONTRAST:  75 mL Isovue 370 COMPARISON:  Carotid Doppler ultrasound 08/25/2016. Head MRI 07/26/2016. Neck MRA 11/23/2009. Chest CT 05/31/2015. FINDINGS: CTA NECK FINDINGS Aortic arch:  Standard 3 vessel aortic arch with extensive calcified and soft plaque. No significant brachiocephalic or right subclavian artery stenosis. There is mild left subclavian artery stenosis proximally due to predominantly calcified plaque. Right carotid system: Patent with mild calcified plaque in the proximal ICA not resulting in significant stenosis. Left carotid system: Patent with mild calcified and soft plaque  in the proximal ICA not resulting in significant stenosis. Vertebral arteries: Patent and codominant. Calcified plaque at both vertebral artery origins does not result in significant stenosis. Skeleton: Advanced cervical disc and facet degeneration. Grade 1 anterolisthesis of C3 on C4 and C4 on C5. Other neck: Diffusely heterogeneous thyroid gland containing multiple nodules, including a dominant 2.1 cm left lobe nodule which has been previously evaluated by ultrasound. Upper chest: Unchanged 2.7 x 1.2 cm right upper lobe lung nodule. Review of the MIP images confirms the above findings CTA HEAD FINDINGS Anterior circulation: The internal carotid arteries are patent from skullbase to carotid termini with moderate atherosclerotic plaque in the siphons bilaterally. There is mild left ICA stenosis near the petrous - cavernous junction, and there is moderate right supraclinoid ICA stenosis. A 1.5 mm outpouching from the right supraclinoid ICA in the posterior communicating region is favored to represent an infundibulum rather than aneurysm. The right MCA is patent with a mild M1 stenosis. The left M1 segment is widely patent, however there is a 3 mm long occlusion (or possibly high-grade stenosis) involving the M2 inferior division origin. The ACAs are patent without evidence of significant proximal stenosis. Posterior circulation: The intracranial vertebral arteries are widely patent to the basilar. Patent PICA, AICA, and SCA origins are identified bilaterally. The basilar artery is patent with mild luminal  irregularity but no significant stenosis. Posterior communicating arteries are diminutive or absent. PCAs are patent with mild atherosclerotic irregularity bilaterally but no significant proximal stenosis. There is a moderate distal left P2 stenosis. No aneurysm. Venous sinuses: Patent. Anatomic variants: None. Delayed phase: No abnormal enhancement. Review of the MIP images confirms the above findings IMPRESSION: 1. Cervical carotid artery atherosclerosis without significant stenosis. 2. Widely patent vertebral arteries. 3. Intracranial atherosclerosis with mild left and moderate right ICA stenosis. 4. Short segment proximal occlusion of the left M2 inferior division. Electronically Signed   By: Logan Bores M.D.   On: 10/13/2016 11:20   Ct Head Wo Contrast  Result Date: 10/22/2016 CLINICAL DATA:  83 y/o  F; episode of slurred speech this afternoon. EXAM: CT HEAD WITHOUT CONTRAST TECHNIQUE: Contiguous axial images were obtained from the base of the skull through the vertex without intravenous contrast. COMPARISON:  10/20/2016 MRI of the head.  10/19/2016 CT of the head. FINDINGS: Brain: No evidence of acute infarction, hemorrhage, hydrocephalus, extra-axial collection or mass lesion/mass effect. Stable chronic microvascular ischemic changes and parenchymal volume loss of the brain. Vascular: Calcific atherosclerosis of carotid siphons. No hyperdense vessel identified. Skull: Normal. Negative for fracture or focal lesion. Sinuses/Orbits: No acute finding. Other: None. IMPRESSION: 1. No acute intracranial abnormality identified. 2. Stable chronic microvascular ischemic changes and parenchymal volume loss of the brain. Electronically Signed   By: Kristine Garbe M.D.   On: 10/22/2016 17:16   Ct Head Wo Contrast  Result Date: 10/19/2016 CLINICAL DATA:  Fall with pain. EXAM: CT HEAD WITHOUT CONTRAST TECHNIQUE: Contiguous axial images were obtained from the base of the skull through the vertex without  intravenous contrast. COMPARISON:  October 13, 2016 FINDINGS: Brain: No subdural, epidural, or subarachnoid hemorrhages identified. Cerebellum, brainstem, and basal cisterns are normal. Ventricles and sulci are unchanged. Scattered white matter changes again identified. No acute cortical ischemia or infarct. No mass effect or midline shift. Vascular: Calcified atherosclerosis is seen in the intracranial carotid arteries. Skull: Normal. Negative for fracture or focal lesion. Sinuses/Orbits: No acute finding. Other: None. IMPRESSION: No acute intracranial abnormality.  Chronic white matter changes. Electronically  Signed   By: Dorise Bullion III M.D   On: 10/19/2016 20:20   Ct Head Wo Contrast  Result Date: 10/13/2016 CLINICAL DATA:  Patient c/o generalized weakness for 1 week. Hx of dizziness worsening over a week, patient states that she cannot walk without a walker due to her worsening dizziness. Patient c/o increasing blood pressures, however this is bein.*comment was truncated* EXAM: CT HEAD WITHOUT CONTRAST TECHNIQUE: Contiguous axial images were obtained from the base of the skull through the vertex without intravenous contrast. COMPARISON:  Head CT 510 13 FINDINGS: Brain: No acute intracranial hemorrhage. No focal mass lesion. No CT evidence of acute infarction. No midline shift or mass effect. No hydrocephalus. Basilar cisterns are patent. Vascular: No hyperdense vessel or unexpected calcification. Skull: Normal. Negative for fracture or focal lesion. Sinuses/Orbits: Paranasal sinuses and mastoid air cells are clear. Orbits are clear. Other: None. IMPRESSION: 1. No acute intracranial findings. 2. Atrophy and white matter microvascular disease. Electronically Signed   By: Suzy Bouchard M.D.   On: 10/13/2016 09:46   Ct Angio Neck W And/or Wo Contrast  Result Date: 10/13/2016 CLINICAL DATA:  Worsening dizziness over the past week. EXAM: CT ANGIOGRAPHY HEAD AND NECK TECHNIQUE: Multidetector CT imaging of  the head and neck was performed using the standard protocol during bolus administration of intravenous contrast. Multiplanar CT image reconstructions and MIPs were obtained to evaluate the vascular anatomy. Carotid stenosis measurements (when applicable) are obtained utilizing NASCET criteria, using the distal internal carotid diameter as the denominator. CONTRAST:  75 mL Isovue 370 COMPARISON:  Carotid Doppler ultrasound 08/25/2016. Head MRI 07/26/2016. Neck MRA 11/23/2009. Chest CT 05/31/2015. FINDINGS: CTA NECK FINDINGS Aortic arch: Standard 3 vessel aortic arch with extensive calcified and soft plaque. No significant brachiocephalic or right subclavian artery stenosis. There is mild left subclavian artery stenosis proximally due to predominantly calcified plaque. Right carotid system: Patent with mild calcified plaque in the proximal ICA not resulting in significant stenosis. Left carotid system: Patent with mild calcified and soft plaque in the proximal ICA not resulting in significant stenosis. Vertebral arteries: Patent and codominant. Calcified plaque at both vertebral artery origins does not result in significant stenosis. Skeleton: Advanced cervical disc and facet degeneration. Grade 1 anterolisthesis of C3 on C4 and C4 on C5. Other neck: Diffusely heterogeneous thyroid gland containing multiple nodules, including a dominant 2.1 cm left lobe nodule which has been previously evaluated by ultrasound. Upper chest: Unchanged 2.7 x 1.2 cm right upper lobe lung nodule. Review of the MIP images confirms the above findings CTA HEAD FINDINGS Anterior circulation: The internal carotid arteries are patent from skullbase to carotid termini with moderate atherosclerotic plaque in the siphons bilaterally. There is mild left ICA stenosis near the petrous - cavernous junction, and there is moderate right supraclinoid ICA stenosis. A 1.5 mm outpouching from the right supraclinoid ICA in the posterior communicating region  is favored to represent an infundibulum rather than aneurysm. The right MCA is patent with a mild M1 stenosis. The left M1 segment is widely patent, however there is a 3 mm long occlusion (or possibly high-grade stenosis) involving the M2 inferior division origin. The ACAs are patent without evidence of significant proximal stenosis. Posterior circulation: The intracranial vertebral arteries are widely patent to the basilar. Patent PICA, AICA, and SCA origins are identified bilaterally. The basilar artery is patent with mild luminal irregularity but no significant stenosis. Posterior communicating arteries are diminutive or absent. PCAs are patent with mild atherosclerotic irregularity bilaterally but  no significant proximal stenosis. There is a moderate distal left P2 stenosis. No aneurysm. Venous sinuses: Patent. Anatomic variants: None. Delayed phase: No abnormal enhancement. Review of the MIP images confirms the above findings IMPRESSION: 1. Cervical carotid artery atherosclerosis without significant stenosis. 2. Widely patent vertebral arteries. 3. Intracranial atherosclerosis with mild left and moderate right ICA stenosis. 4. Short segment proximal occlusion of the left M2 inferior division. Electronically Signed   By: Logan Bores M.D.   On: 10/13/2016 11:20   Mr Brain Wo Contrast  Result Date: 10/20/2016 CLINICAL DATA:  Dizziness.  Known CVA. EXAM: MRI HEAD WITHOUT CONTRAST TECHNIQUE: Multiplanar, multiecho pulse sequences of the brain and surrounding structures were obtained without intravenous contrast. COMPARISON:  07/26/2016 brain MRI FINDINGS: Brain: No acute infarction, hemorrhage, hydrocephalus, extra-axial collection or mass lesion. Chronic small vessel ischemia with stable pattern of periventricular predominant cerebral involvement. Mild for age generalized cerebral volume loss. Remote small vessel infarct in the right cerebellum. Vascular: Major flow voids are preserved. Skull and upper cervical  spine: Negative for marrow lesion. Sinuses/Orbits: No acute finding IMPRESSION: 1. No acute finding or change from April 2018. 2.  Chronic small vessel ischemia. Electronically Signed   By: Monte Fantasia M.D.   On: 10/20/2016 13:36   Dg Hip Unilat W Or W/o Pelvis 2-3 Views Left  Result Date: 10/19/2016 CLINICAL DATA:  Left hip pain after fall. EXAM: DG HIP (WITH OR WITHOUT PELVIS) 2-3V LEFT COMPARISON:  No comparison studies available. FINDINGS: Bones are demineralized. No evidence for fracture. No dislocation. SI joints and symphysis pubis are unremarkable. IMPRESSION: No acute bony abnormality. Electronically Signed   By: Misty Stanley M.D.   On: 10/19/2016 19:07    Assessment/Plan 1. Gait Instability  Continue working with PT/OT  Continue exercises as taught by PT/OT  Mobilize on unit with W/C when unassisted  2. Other secondary hypertension  Antihypertensives d/c'ed d/t dizziness  Monitor B/Ps  No tx for now  3. Slurred speech  No treatment  Monitor for worsening symptoms  Family/ staff Communication:   Total Time:  Documentation:  Face to Face:  Family/Phone:   Labs/tests ordered:  MRI on monday  Medication list reviewed and assessed for continued appropriateness.  Vikki Ports, NP-C Geriatrics [facility] [phone], [place] [address] Cell Phone (Mon-Fri 8am-5pm):  [phone] On Call:  [phone] & follow prompts after 5pm & weekends Office Phone:  [phone] Office Fax:  [phone]

## 2016-11-03 DIAGNOSIS — I69398 Other sequelae of cerebral infarction: Secondary | ICD-10-CM | POA: Insufficient documentation

## 2016-11-07 ENCOUNTER — Telehealth: Payer: Self-pay | Admitting: Cardiovascular Disease

## 2016-11-07 NOTE — Telephone Encounter (Signed)
Spoke w/ pt.  She states that Dr. Ginette Pitman recommended she f/u w/ Dr. Rockey Situ to make sure he is agreeable to the changes that were made today. She states that Dr. Ginette Pitman was going to send his note to Dr. Rockey Situ and that "Dr. Rockey Situ is supposed to call him to discuss this". Advised her that I will make Dr. Rockey Situ aware of her concerns and call her back if he wishes to make any changes.

## 2016-11-07 NOTE — Telephone Encounter (Signed)
[place] patient daughter in law came into office  Stating patient just saw Dr Ginette Pitman and was advised to see if Dr Rockey Situ would give advise to them for she's having issues with BP  BP is going higher and higher, Dr Ginette Pitman has adjusted some of the medications but he would like Dr Rockey Situ to see if he would have any advise as well  Please call back

## 2016-11-07 NOTE — Telephone Encounter (Signed)
Pt is returning your call, she will be back home after 4:30

## 2016-11-07 NOTE — Telephone Encounter (Addendum)
Marcie Bal & Dorian Pod are only daughters listed on pt's DPR to discuss med info w/. Left message for pt to call back.   Pt was advised by PCP today:  Patient Instructions - Azzie Glatter, MD - 11/07/2016 1:15 PM EDT Increase Amlodipine to 5 mg po qd. Continue to monitor BP at home  Take an extra Metoprolol 12.5 mg in the evenings if BP goes over 127 systolic  Follow up in 2-3 weeks

## 2016-11-08 NOTE — Telephone Encounter (Signed)
Pt calling to let us know Dr Ginette Pitman said to ask [place] if we can order her a BP machine if we thought it was needed.  He though she could benefit from it but stated to her we would need to help her with this   Please advise

## 2016-11-09 NOTE — Telephone Encounter (Signed)
Medication changes look fine I've never had to write for a blood pressure cuff, typically people by 1 at [facility] or any store Certainly we can write a prescription but I do not think it will be covered

## 2016-11-10 NOTE — Telephone Encounter (Signed)
Spoke with patient and reviewed that Dr. Rockey Situ was agreeable with Dr. Linton Ham recommendations. Also let her know that blood pressure cuff is not covered by insurance and that she could purchase one at her local store. She reports that she is wanting a continuous blood pressure monitor. Let her know that I am not familiar with any continuous monitors that are available for home use. She also reports that last night her blood pressures were 170 & 180's and that it remains high so she is calling Dr. Linton Ham office back today. Instructed her to continue monitoring her blood pressures with her home machine. She verbalized understanding of our conversation, agreement with plan, and had no further questions at this time.

## 2016-11-14 ENCOUNTER — Ambulatory Visit (INDEPENDENT_AMBULATORY_CARE_PROVIDER_SITE_OTHER): Payer: Medicare Other | Admitting: Physician Assistant

## 2016-11-14 ENCOUNTER — Encounter: Payer: Self-pay | Admitting: Physician Assistant

## 2016-11-14 VITALS — BP 130/52 | HR 67 | Ht 61.0 in | Wt 115.0 lb

## 2016-11-14 DIAGNOSIS — R0989 Other specified symptoms and signs involving the circulatory and respiratory systems: Secondary | ICD-10-CM | POA: Diagnosis not present

## 2016-11-14 DIAGNOSIS — R42 Dizziness and giddiness: Secondary | ICD-10-CM

## 2016-11-14 DIAGNOSIS — Z79899 Other long term (current) drug therapy: Secondary | ICD-10-CM

## 2016-11-14 DIAGNOSIS — E782 Mixed hyperlipidemia: Secondary | ICD-10-CM | POA: Diagnosis not present

## 2016-11-14 DIAGNOSIS — I6521 Occlusion and stenosis of right carotid artery: Secondary | ICD-10-CM

## 2016-11-14 MED ORDER — AMLODIPINE BESYLATE 2.5 MG PO TABS: 2.5000 mg | ORAL_TABLET | Freq: Every day | ORAL | 6 refills | Status: DC

## 2016-11-14 NOTE — Progress Notes (Signed)
Cardiology Office Note Date:  11/14/2016  Patient ID:  Claire, Washington [birth year], MRN [id number] PCP:  Tracie Harrier, MD  Cardiologist:  Dr. Rockey Situ, MD    Chief Complaint: Elevated BP  History of Present Illness: Claire Washington is a 83 y.o. female with history of syncope, subdural hemoatoma, chronic dizziness, HTN, HLD, and embolic stroke noted on MRI who presents for evaluation of BP.   Prior episode of syncope noted in 2013 with resultant SDH. Previously evaluated with stress testing and echo a [facility], both of which were noted to be normal. With regards to her dizziness, she has been evaluated by ENT in the past and even underwent physical therapy without improvement in dizziness. She has previously been offered meclizine but has never actually tried it. She is known to have moderate carotid arterial disease on the right which has been stable. In April of this year, she underwent MRI of the brain which showed evidence of embolic strokes involving the right superior cerebellum and left lateral occipital cortex. She has been on Plavix therapy and was advised by neurology to start aspirin a few days a week but she has been reluctant to do this given prior history of subdural hematoma. In the setting of MRI findings, she saw Dr. Rockey Situ on April 20 and subsequently underwent echocardiography and event monitoring. Echo showed normal LV function without significant valvular disease. Event monitoring did not show any evidence of atrial fibrillation or any significant tachy or brady arrhythmias. On review of event monitoring, she did frequently drop her heart rates into the 40s generally occurring sometime between 9:00 11:00 in the morning. There were no reported symptoms during that period of time. All reported symptoms were during periods of sinus rhythm. She said that she takes her beta blocker sometime around 7 or 8:00 in the morning. She was most recently seen on 09/20/16 for  follow up and was doing well at that time. She was seen in the ED on 6/29 for weakness and dizziness. Workup including EKG, CT head, and labs were not acute. She was admitted to the hospital in early July for mechanical fall in the setting of dizziness. No acute findings. PT recommended rehab.   More recently, she has been noting continued upward trend in her blood pressure readings to the 295-621 systolic range. PCP increased amlodipine to 5 mg daily and advised patient to take an extra Lopressor 12.5 mg in the evening if BP was > 308 systolic.  She comes in doing well today. She brings in her BP log that shows consistently elevated readings in the 657Q to 469G systolic. She has had a rare reading in the low 295M systolic, which correlated with her ED visit for a fall in early July. PCP has her currently taking amlodipine 5 mg daily and Lopressor 12.5 mg q PM with an extra 12.5 mg in the PM if her BP is > 841 systolic. No chest pain or SOB. No lower extremity swelling. No further falls. Working with home health aids.    Past Medical History:  Diagnosis Date  . Arthritis   . Basal cell carcinoma    basel cell  . Chronic cystitis   . Diabetes mellitus type 2, controlled, without complications ([place])    type 2 or unspecified type diabetes mellitus without mention of complication, not stated as uncontrolled  . Disease of thyroid gland   . Embolic stroke ([place])    a. 07/2016 MRI Brain: small right superior cerebellum  and left lateral occipital cortex infarcts;  b. 08/2016 Echo: EF 60-65%, Gr1 DD, mild MR;  c. 08/2016 30 Day Event Monitor: No significant tachy/bradyarrhythmias. No afib.  . Follicular neoplasm of thyroid   . Hemorrhage of rectum and anus   . Hyperlipidemia   . Hyperlipidemia, unspecified   . Hypertension   . Leg pain   . Osteoarthritis   . Pernicious anemia   . Small vessel disease   . Spinal stenosis, lumbar region without neurogenic claudication   . Subdural hematoma ([place])    a.  2013 in setting of syncope.  . Syncope and collapse    a. 2013 - syncope and fall w/ resultant subdural hematoma; b. prev nl echo and stress test @ [facility].  Marland Kitchen Thyroid nodule   . Vertigo & Chronic Dizziness    a. Previously seen by ENT with Physical Therapy.  MRI 2000 small vessel disease    Past Surgical History:  Procedure Laterality Date  . BILATERAL SALPINGOOPHORECTOMY    . COLONOSCOPY  01/26/2009   Diverticulosis  . CYSTOSCOPY    . HEMORRHOIDECTOMY BY SIMPLE LIGATION    . PR COLONOSCOPY FLX W/ENDOSCOPIC MUCOSAL RESECTION  09/02/2014   Procedure: COLONOSCOPY, FLEXIBLE; WITH ENDOSCOPIC MUCOSAL RESECTION; Surgeon: Saunders Revel, MD; Location: [facility]; Service: Gastroenterology  . PR COLSC FLX W/RMVL OF TUMOR POLYP LESION SNARE TQ  09/02/2014   Procedure: COLONOSCOPY FLEX; W/REMOV TUMOR/LES BY SNARE; Surgeon: Saunders Revel, MD; Location: [facility]; Service: Gastroenterology  . SKIN SURGERY    . TOTAL ABDOMINAL HYSTERECTOMY W/ BILATERAL SALPINGOOPHORECTOMY  1970s    Current Meds  Medication Sig  . clopidogrel (PLAVIX) 75 MG tablet Take 75 mg by mouth daily.  . metoprolol tartrate (LOPRESSOR) 25 MG tablet Take 12.5 mg by mouth 2 (two) times daily.  . Multiple Vitamins-Minerals (CENTRUM SILVER PO) Take 1 tablet by mouth daily. Reported on 06/11/2015  . rosuvastatin (CRESTOR) 10 MG tablet Take 5 mg by mouth daily.     Allergies:   Hydrocortisone and Sulfa antibiotics   Social History:  The patient  reports that she quit smoking about 23 years ago. Her smoking use included Cigarettes. She has a 5.00 pack-year smoking history. She has never used smokeless tobacco. She reports that she drinks about 1.2 oz of alcohol per week . She reports that she does not use drugs.   Family History:  The patient's family history includes Asthma in her daughter; Colon cancer in her mother; Diabetes in her other; Heart attack in her brother and father;  Heart disease in her father; Prostate cancer in her father; Stroke in her father.  ROS:   Review of Systems  Constitutional: Negative for chills, diaphoresis, fever, malaise/fatigue and weight loss.  HENT: Negative for congestion.   Eyes: Negative for discharge and redness.  Respiratory: Negative for cough, hemoptysis, sputum production, shortness of breath and wheezing.   Cardiovascular: Negative for chest pain, palpitations, orthopnea, claudication, leg swelling and PND.  Gastrointestinal: Negative for abdominal pain, blood in stool, heartburn, melena, nausea and vomiting.  Genitourinary: Negative for hematuria.  Musculoskeletal: Positive for falls. Negative for myalgias.  Skin: Negative for rash.  Neurological: Positive for dizziness and weakness. Negative for tingling, tremors, sensory change, speech change, focal weakness and loss of consciousness.  Endo/Heme/Allergies: Does not bruise/bleed easily.  Psychiatric/Behavioral: Negative for substance abuse. The patient is not nervous/anxious.   All other systems reviewed and are negative.    PHYSICAL EXAM:  VS:  BP (!) 130/52 (BP Location: Left Arm, Patient Position: Sitting, Cuff Size: Normal)   Pulse 67   Ht 5\' 1"  (1.549 m)   Wt 115 lb (52.2 kg)   BMI 21.73 kg/m  BMI: Body mass index is 21.73 kg/m.  Physical Exam  Constitutional: She is oriented to person, place, and time. She appears well-developed and well-nourished.  HENT:  Head: Normocephalic and atraumatic.  Eyes: Right eye exhibits no discharge. Left eye exhibits no discharge.  Neck: Normal range of motion. No JVD present.  Cardiovascular: Normal rate, regular rhythm, S1 normal, S2 normal and normal heart sounds.  Exam reveals no distant heart sounds, no friction rub, no midsystolic click and no opening snap.   No murmur heard. Pulmonary/Chest: Effort normal and breath sounds normal. No respiratory distress. She has no decreased breath sounds. She has no wheezes. She has  no rales. She exhibits no tenderness.  Abdominal: Soft. She exhibits no distension. There is no tenderness.  Musculoskeletal: She exhibits no edema.  Neurological: She is alert and oriented to person, place, and time.  Skin: Skin is warm and dry. No cyanosis. Nails show no clubbing.  Psychiatric: She has a normal mood and affect. Her speech is normal and behavior is normal. Judgment and thought content normal.     EKG:  Was ordered and interpreted by me today. Shows NSR, 69 bpm, no acute st/t changes   Recent Labs: 10/19/2016: ALT 14; Magnesium 1.8; TSH 4.261 10/20/2016: BUN 10; Hemoglobin 14.0; Platelets 172; Potassium 3.9; Sodium 136 10/23/2016: Creatinine, Ser 0.85  No results found for requested labs within last 8760 hours.   CrCl cannot be calculated (Patient's most recent lab result is older than the maximum 21 days allowed.).   Wt Readings from Last 3 Encounters:  11/14/16 115 lb (52.2 kg)  10/27/16 123 lb 7.3 oz (56 kg)  10/19/16 116 lb 4.8 oz (52.8 kg)     Other studies reviewed: Additional studies/records reviewed today include: summarized above  ASSESSMENT AND PLAN:  1. Labile hypertension: BP log reviewed in detail which shows BP readings consistently in the 314 to 970 systolic range. She recommend she take Lopressor 12.5 mg bid rather than just daily. Given her prior history of labile hypertension and associated fall with systolic BP of 263 mmHg, I will decrease her amlodipine to 2.5 mg daily. She will follow up with an RN visit in 1 week and bring her BP log at that time for review. Further adjustments pending the above changes. Patient to contact her insurance company to see about continuous BP monitoring coverage. She does not want this if insurance will not cover. Continue to change positions slowly and ambulate with a walker.   2. Dizziness: Possibly in the setting of labile hypertension given review of her BP log that indicated a SBP of 106 mmHg on the day she fell.  Medication changes as above. Continue to follow up with PCP. HH aid. Walker.   3. HLD: Crestor. Followed by primary care.   Disposition: F/u with RN BP visit in 1 week followed by Dr. Rockey Situ in 3 months.   Current medicines are reviewed at length with the patient today.  The patient did not have any concerns regarding medicines.  Melvern Banker PA-C 11/14/2016 2:24 PM     [address] [place], [place] [address] [phone]

## 2016-11-14 NOTE — Patient Instructions (Signed)
Medication Instructions:  Your physician has recommended you make the following change in your medication:  1. INCREASE Metoprolol 12.5 mg twice a day 2. DECREASE Amlodipine to 2.5 mg once daily  Testing/Procedures: Please call your insurance to see if they will cover the continuous blood pressure monitoring and if they do then let us know and we will order this for you.  Follow-Up: Your physician recommends that you schedule a nurse visit appointment in: 1 week to have your blood pressure checked.   Your physician recommends that you schedule a follow-up appointment in: 3 months with Dr. Rockey Situ.   It was a pleasure seeing you today here in the office. Please do not hesitate to give [place] a call back if you have any further questions. [place], BSN

## 2016-11-21 ENCOUNTER — Ambulatory Visit (INDEPENDENT_AMBULATORY_CARE_PROVIDER_SITE_OTHER): Payer: Medicare Other | Admitting: *Deleted

## 2016-11-21 ENCOUNTER — Telehealth: Payer: Self-pay | Admitting: *Deleted

## 2016-11-21 VITALS — BP 133/66 | HR 59 | Resp 18 | Ht 61.0 in | Wt 115.0 lb

## 2016-11-21 DIAGNOSIS — I10 Essential (primary) hypertension: Secondary | ICD-10-CM

## 2016-11-21 NOTE — Patient Instructions (Signed)
Your physician has requested that you regularly monitor and record your blood pressure readings at home. Please use the same machine at the same time of day to check your readings and record them to bring to your follow-up visit.  Continue to monitor your blood pressures at home and keep log of those readings. Please call us if blood pressures consistently are 140/80 or higher.  It was a pleasure seeing you today here in the office. Please do not hesitate to give [place] a call back if you have any further questions. [place], BSN

## 2016-11-21 NOTE — Progress Notes (Signed)
   Overall blood pressure is improving. Must be cautious given patient's history of labile hypertension. Please have patient do a trial of increasing amlodipine to 5 mg daily. Would continue Lopressor 12.5 twice a day. If blood pressure drops significantly to the low 406R systolic with decreased amlodipine back to 2.5 mg daily and allow her to have permissive hypertension.

## 2016-11-21 NOTE — Telephone Encounter (Signed)
Reviewed recommendations with patient to increase amlodipine to 5 mg once daily and to continue monitoring blood pressures. She verbalized understanding with no further questions at this time. Patient reports that she has some 5 mg tablets available and does not need script at this time.    Rise Mu, PA-C at 11/21/2016 8:50 AM   Status: Signed     Overall blood pressure is improving. Must be cautious given patient's history of labile hypertension. Please have patient do a trial of increasing amlodipine to 5 mg daily. Would continue Lopressor 12.5 twice a day. If blood pressure drops significantly to the low 010X systolic with decreased amlodipine back to 2.5 mg daily and allow her to have permissive hypertension.

## 2016-11-21 NOTE — Progress Notes (Signed)
1.) Reason for visit: Blood pressure check  2.) Name of MD requesting visit: [name] PA  3.) H&P: Hypertension, syncope, Dizziness  4.) ROS related to problem: Elevated blood pressure and recent medication changes.   5.) Assessment and plan per MD: Will route message to [name] PA for review and further recommendations.  Patient also brought in blood pressure log from home. The week of 11/06/16 blood pressures ranged from 170's/mid 50-60's to mid 140's over 60's. Week of 11/14/16 Mid 140's over 60's to mid 130's over 60's  Today  Right arm 133/66 Left arm 155/63

## 2016-12-11 ENCOUNTER — Other Ambulatory Visit: Payer: Self-pay | Admitting: Cardiovascular Disease

## 2016-12-14 ENCOUNTER — Other Ambulatory Visit: Payer: Self-pay | Admitting: *Deleted

## 2016-12-14 ENCOUNTER — Telehealth: Payer: Self-pay | Admitting: Cardiovascular Disease

## 2016-12-14 MED ORDER — ROSUVASTATIN CALCIUM 10 MG PO TABS: 5.0000 mg | ORAL_TABLET | Freq: Every day | ORAL | 3 refills | Status: DC

## 2016-12-14 NOTE — Telephone Encounter (Signed)
Pt calling stating pharmacy is needing [place] to send in a refill on her Crestor  Please send to [facility]

## 2016-12-14 NOTE — Telephone Encounter (Signed)
Requested Prescriptions   Signed Prescriptions Disp Refills  . rosuvastatin (CRESTOR) 10 MG tablet 30 tablet 3    Sig: Take 0.5 tablets (5 mg total) by mouth daily.    Authorizing Provider: Minna Merritts    Ordering User: Britt Bottom

## 2016-12-21 ENCOUNTER — Ambulatory Visit: Payer: Medicare Other | Admitting: Cardiovascular Disease

## 2017-02-13 NOTE — Progress Notes (Signed)
Cardiology Office Note  Date:  02/14/2017   ID:  Claire Washington, DOB [birth year], MRN [id number]  PCP:  Tracie Harrier, MD   Chief Complaint  Patient presents with  . Other    3 month follow up. Patient denies chest pain and SOB. Meds reviewed verbally with patient.     HPI:  Claire Washington is a very pleasant 83 year-old woman with history of   hypertension, chronic dizziness, benign positional vertigo,  osteoarthritis,  hyperlipidemia,  carotid arterial disease (followed by Dr. Lucky Cowboy, 50% on there right),  chronic cystitis,  diabetes,  Long smoking history until age 60 Previous TIAs per the patient Chronic  dizziness in her head, gait instability, for years, progressive who presents for routine followup of her dizziness, hypertension, TIA/CVAs  Previous testing discussed with her in detail Echo 08/2016 Normal LV function No significant valve disease No indication from the echo as to cause of dizziness  Event monitor 08/2016 Normal sinus rhythm Rare PVCs, <1% No other significant arrhythmia noted Symptoms of dizziness did not seem to correlate to underlying arrhythmia  In general reports she is doing well, working with physical therapy, no recent falls Still with significant leg weakness crestor expensive, would like to change Pharmacy discharging her $36 for 45 pills as she cuts them in half Denies any side effects to the Crestor 5  Chronic equilibrium and dizziness dating back several years, worse recently Difficult time with equilibrium.  Working with PT  Other past medical history reviewed MRI of the head performed showing Atrophy and small vessel disease. Small foci of restricted diffusion in the RIGHT superior cerebellum and LEFT lateral occipital cortex, consistent with nonhemorrhagic embolic infarcts.  Previously reported having episode of word confusion Resolved without intervention  Previous Total chol 211  TSH 8.3.   Episodes of syncope in the  setting of GI issues/constipation, vasovagal episodes Sulfa allergy  History of swelling on high-dose amlodipine pneumonia November 2015.   syncope 08/25/2011. She was in her kitchen preparing dinner and she had acute loss of consciousness, hit her head, left temple on the refrigerator. She had a subarachnoid hemorrhage noted on evaluation at [facility], transferred to [facility] for further evaluation.   On her second episode, she reports having a cocktail, milk of magnesia with constipation. She was discharged to rehabilitation. While at rehabilitation, she was sitting on a bathroom commode, with constipation, had taken senna, she had lightheadedness and passed out for a brief period.   Previous Holter monitor and stress test several years ago at [facility].   PMH:   has a past medical history of Arthritis; Basal cell carcinoma; Chronic cystitis; Diabetes mellitus type 2, controlled, without complications ([place]); Disease of thyroid gland; Embolic stroke ([place]); Follicular neoplasm of thyroid; Hemorrhage of rectum and anus; Hyperlipidemia; Hyperlipidemia, unspecified; Hypertension; Leg pain; Osteoarthritis; Pernicious anemia; Small vessel disease; Spinal stenosis, lumbar region without neurogenic claudication; Subdural hematoma ([place]); Syncope and collapse; Thyroid nodule; and Vertigo & Chronic Dizziness.  PSH:    Past Surgical History:  Procedure Laterality Date  . BILATERAL SALPINGOOPHORECTOMY    . COLONOSCOPY  01/26/2009   Diverticulosis  . CYSTOSCOPY    . HEMORRHOIDECTOMY BY SIMPLE LIGATION    . PR COLONOSCOPY FLX W/ENDOSCOPIC MUCOSAL RESECTION  09/02/2014   Procedure: COLONOSCOPY, FLEXIBLE; WITH ENDOSCOPIC MUCOSAL RESECTION; Surgeon: Saunders Revel, MD; Location: [facility]; Service: Gastroenterology  . PR COLSC FLX W/RMVL OF TUMOR POLYP LESION SNARE TQ  09/02/2014   Procedure: COLONOSCOPY FLEX; W/REMOV TUMOR/LES BY  SNARE; Surgeon: Saunders Revel, MD; Location: [facility]; Service: Gastroenterology  . SKIN SURGERY    . TOTAL ABDOMINAL HYSTERECTOMY W/ BILATERAL SALPINGOOPHORECTOMY  1970s    Current Outpatient Prescriptions  Medication Sig Dispense Refill  . clopidogrel (PLAVIX) 75 MG tablet Take 75 mg by mouth daily.    . metoprolol tartrate (LOPRESSOR) 25 MG tablet Take 12.5 mg by mouth 2 (two) times daily.    . Multiple Vitamins-Minerals (CENTRUM SILVER PO) Take 1 tablet by mouth daily. Reported on 06/11/2015    . rosuvastatin (CRESTOR) 10 MG tablet Take 0.5 tablets (5 mg total) by mouth daily. 30 tablet 3  . amLODipine (NORVASC) 2.5 MG tablet Take 1 tablet (2.5 mg total) by mouth daily. (Patient taking differently: Take 5 mg by mouth daily. ) 30 tablet 6   No current facility-administered medications for this visit.      Allergies:   Hydrocortisone and Sulfa antibiotics   Social History:  The patient  reports that she quit smoking about 23 years ago. Her smoking use included Cigarettes. She has a 5.00 pack-year smoking history. She has never used smokeless tobacco. She reports that she drinks about 1.2 oz of alcohol per week . She reports that she does not use drugs.   Family History:   family history includes Asthma in her daughter; Colon cancer in her mother; Diabetes in her other; Heart attack in her brother and father; Heart disease in her father; Prostate cancer in her father; Stroke in her father.    Review of Systems: Review of Systems  Constitutional: Negative.   Respiratory: Negative.   Cardiovascular: Negative.   Gastrointestinal: Negative.   Musculoskeletal: Negative.        Gait instability  Neurological: Negative.   Psychiatric/Behavioral: Negative.   All other systems reviewed and are negative. No significant change in review of systems   PHYSICAL EXAM: VS:  BP (!) 140/58 (BP Location: Left Arm, Patient Position: Sitting, Cuff Size: Normal)   Pulse 65   Ht 5\' 1"  (1.549 m)   Wt 121 lb 4 oz (55 kg)   BMI 22.91  kg/m  , BMI Body mass index is 22.91 kg/m. GEN: Well nourished, well developed, in no acute distress , unstable gait, walks with a walker HEENT: normal  Neck: no JVD, carotid bruits, or masses Cardiac: RRR; no murmurs, rubs, or gallops,no edema  Respiratory:  clear to auscultation bilaterally, normal work of breathing GI: soft, nontender, nondistended, + BS MS: no deformity or atrophy  Skin: warm and dry, no rash Neuro:  Strength and sensation are intact Psych: euthymic mood, full affect No significant change in exam   Recent Labs: 10/19/2016: ALT 14; Magnesium 1.8; TSH 4.261 10/20/2016: BUN 10; Hemoglobin 14.0; Platelets 172; Potassium 3.9; Sodium 136 10/23/2016: Creatinine, Ser 0.85    Lipid Panel No results found for: CHOL, HDL, LDLCALC, TRIG    Wt Readings from Last 3 Encounters:  02/14/17 121 lb 4 oz (55 kg)  11/21/16 115 lb (52.2 kg)  11/14/16 115 lb (52.2 kg)       ASSESSMENT AND PLAN:  Other secondary hypertension Blood pressure is well controlled on today's visit. No changes made to the medications.  Dizziness Periods of lightheadedness, gait instability, confusion MRI showing possible embolic strokes She is working with physical therapy,  No recent fall, s improved symptom  Stroke Multiple embolic strokes on MRI Etiology unclear, moderate carotid disease on the right followed by Dr. Lucky Cowboy  Smoking hx Currently a  nonsmoker  Type 2 diabetes mellitus with other circulatory complication, without long-term current use of insulin (HCC) Recommended low carbohydrate diet Unable to exercise secondary to gait instability  Mixed hyperlipidemia She is concerned about price of Crestor, we will change to Lipitor 10 mg daily  Acute confusion Denies any further episodes of confusion Previous MRI.  No further workup at this time    Total encounter time more than 25 minutes  Greater than 50% was spent in counseling and coordination of care with the  patient   Disposition:   F/U  12 months   No orders of the defined types were placed in this encounter.    Signed, Esmond Plants, M.D., Ph.D. 02/14/2017  [place], [place] \

## 2017-02-14 ENCOUNTER — Ambulatory Visit (INDEPENDENT_AMBULATORY_CARE_PROVIDER_SITE_OTHER): Payer: Medicare Other | Admitting: Cardiovascular Disease

## 2017-02-14 ENCOUNTER — Encounter: Payer: Self-pay | Admitting: Cardiovascular Disease

## 2017-02-14 VITALS — BP 140/58 | HR 65 | Ht 61.0 in | Wt 121.2 lb

## 2017-02-14 DIAGNOSIS — E1159 Type 2 diabetes mellitus with other circulatory complications: Secondary | ICD-10-CM | POA: Diagnosis not present

## 2017-02-14 DIAGNOSIS — Z87891 Personal history of nicotine dependence: Secondary | ICD-10-CM

## 2017-02-14 DIAGNOSIS — E782 Mixed hyperlipidemia: Secondary | ICD-10-CM | POA: Diagnosis not present

## 2017-02-14 DIAGNOSIS — I6521 Occlusion and stenosis of right carotid artery: Secondary | ICD-10-CM

## 2017-02-14 DIAGNOSIS — I639 Cerebral infarction, unspecified: Secondary | ICD-10-CM | POA: Diagnosis not present

## 2017-02-14 DIAGNOSIS — I10 Essential (primary) hypertension: Secondary | ICD-10-CM

## 2017-02-14 MED ORDER — AMLODIPINE BESYLATE 5 MG PO TABS: 5.0000 mg | ORAL_TABLET | Freq: Every day | ORAL | 4 refills | Status: DC

## 2017-02-14 MED ORDER — ATORVASTATIN CALCIUM 10 MG PO TABS: 10.0000 mg | ORAL_TABLET | Freq: Every day | ORAL | 4 refills | Status: DC

## 2017-02-14 NOTE — Patient Instructions (Addendum)
Medication Instructions:   When you run out, Change to atorvastatin one a day for cholesterol  Labwork:  No new labs needed  Testing/Procedures:  No further testing at this time   Follow-Up: It was a pleasure seeing you in the office today. Please call us if you have new issues that need to be addressed before your next appt.  [phone]  Your physician wants you to follow-up in: 12 months.  You will receive a reminder letter in the mail two months in advance. If you don't receive a letter, please call our office to schedule the follow-up appointment.  If you need a refill on your cardiac medications before your next appointment, please call your pharmacy.

## 2017-06-29 ENCOUNTER — Other Ambulatory Visit: Payer: Self-pay | Admitting: Internal Medicine

## 2017-06-29 DIAGNOSIS — Z1231 Encounter for screening mammogram for malignant neoplasm of breast: Secondary | ICD-10-CM

## 2017-07-18 ENCOUNTER — Ambulatory Visit
Admission: RE | Admit: 2017-07-18 | Discharge: 2017-07-18 | Disposition: A | Discharge status: Home / Self Care | Payer: Medicare Other | Source: Ambulatory Visit | Attending: Internal Medicine | Admitting: Internal Medicine

## 2017-07-18 DIAGNOSIS — Z1231 Encounter for screening mammogram for malignant neoplasm of breast: Secondary | ICD-10-CM | POA: Diagnosis present

## 2017-07-31 ENCOUNTER — Other Ambulatory Visit: Payer: Self-pay | Admitting: Cardiovascular Disease

## 2017-09-07 ENCOUNTER — Ambulatory Visit (INDEPENDENT_AMBULATORY_CARE_PROVIDER_SITE_OTHER): Payer: Medicare Other | Admitting: Vascular Surgery

## 2017-09-07 ENCOUNTER — Encounter (INDEPENDENT_AMBULATORY_CARE_PROVIDER_SITE_OTHER): Payer: Medicare Other

## 2017-10-17 ENCOUNTER — Other Ambulatory Visit: Payer: Self-pay

## 2017-10-17 MED ORDER — METOPROLOL TARTRATE 25 MG PO TABS: 12.5000 mg | ORAL_TABLET | Freq: Two times a day (BID) | ORAL | 6 refills | Status: DC

## 2017-10-23 IMAGING — US US EXTREM LOW VENOUS*L*
1 series · 13 of 24 positions shown · non-contrast
Comparison: None.

CLINICAL DATA: Patient with left leg swelling for 1-1/2 weeks.



[Series 1: us extrem low venous*left* · 0.08mm/px · 13 of 36 slices shown]
[im 1/36]
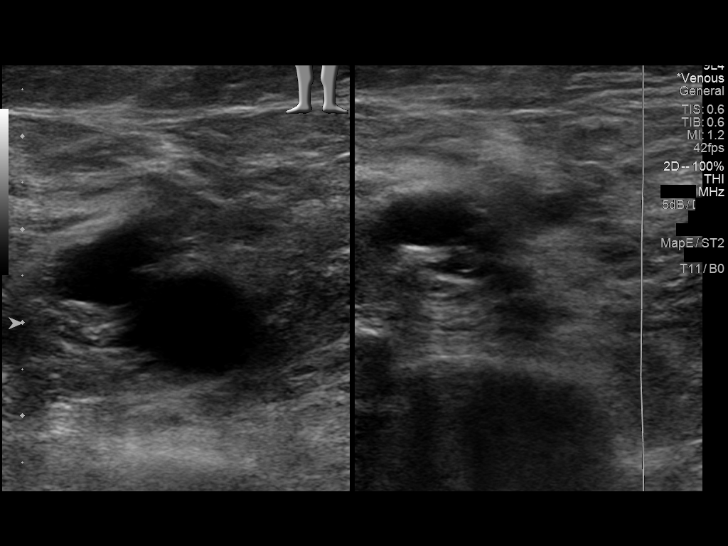
[im 4/36]
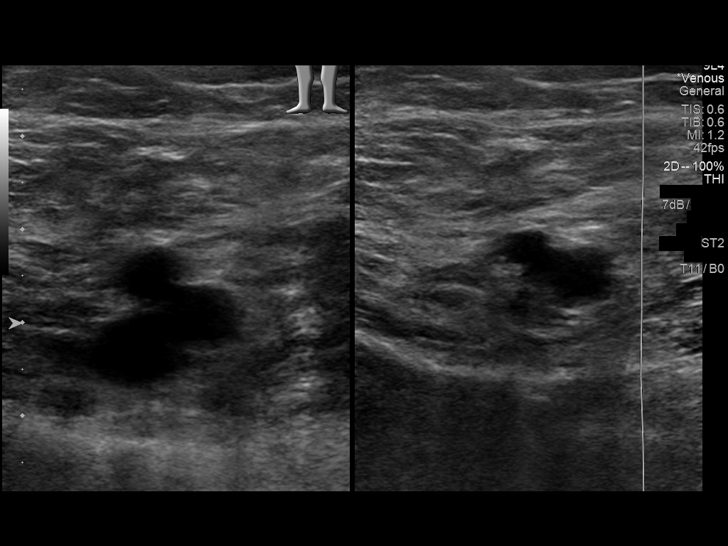
[im 7/36]
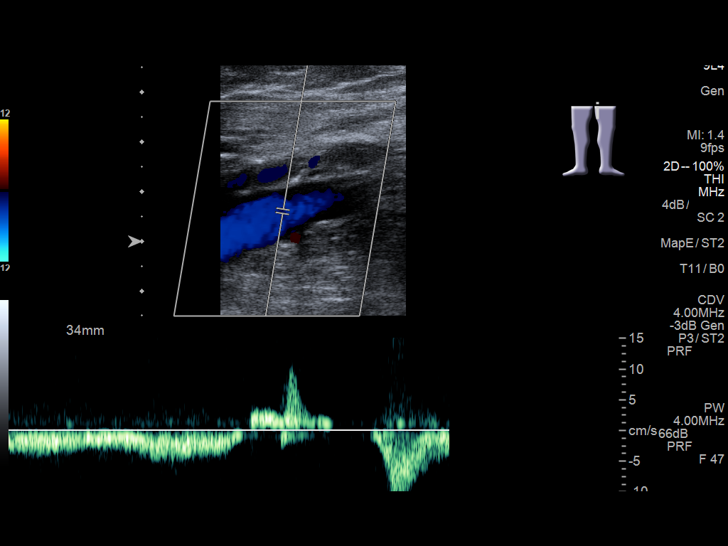
[im 10/36]
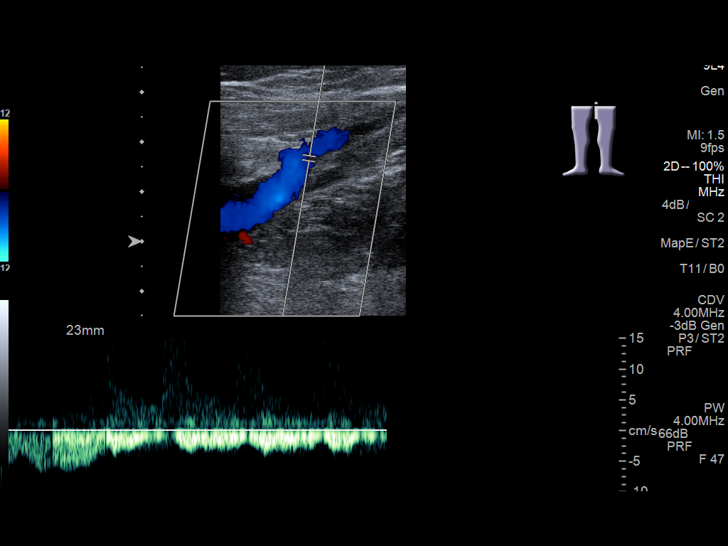
[im 13/36]
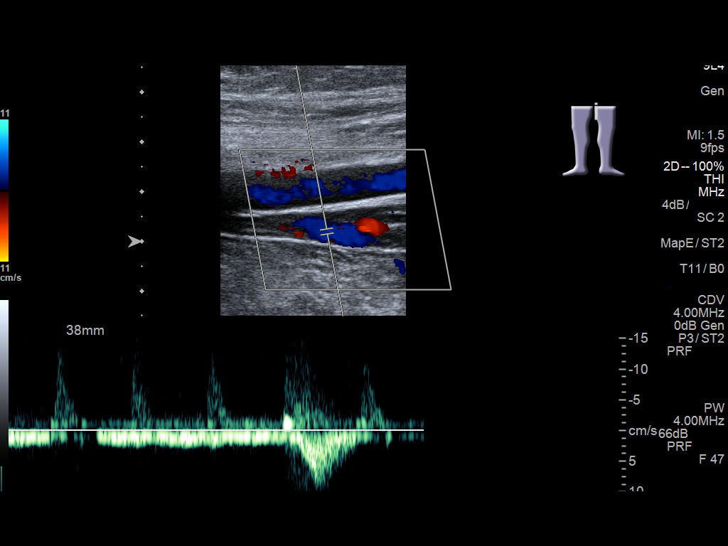
[im 16/36]
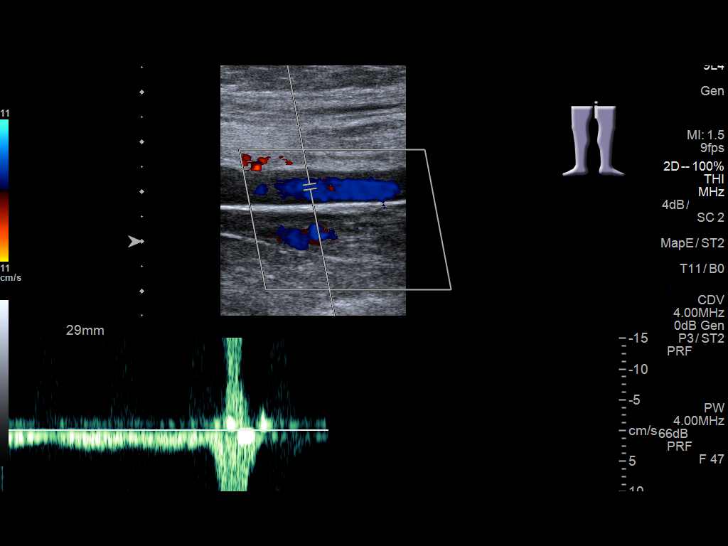
[im 19/36]
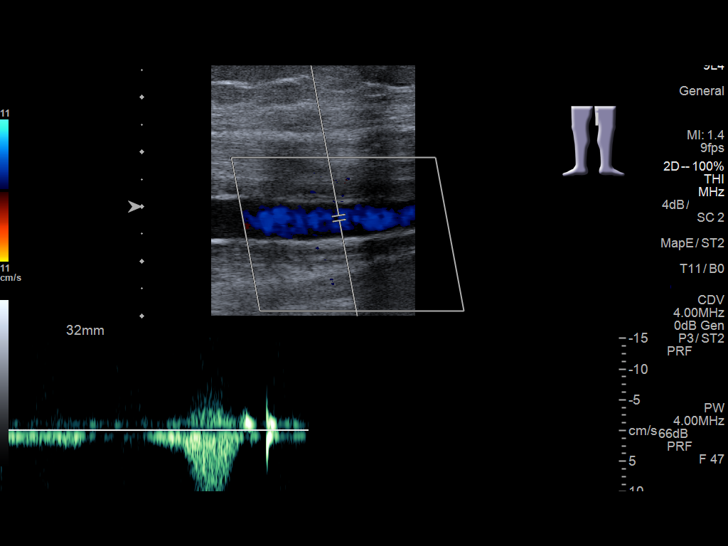
[im 20/36]
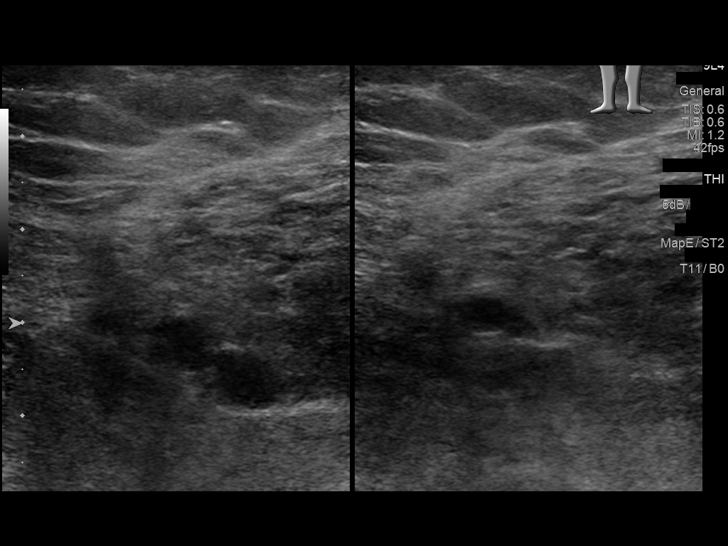
[im 23/36]
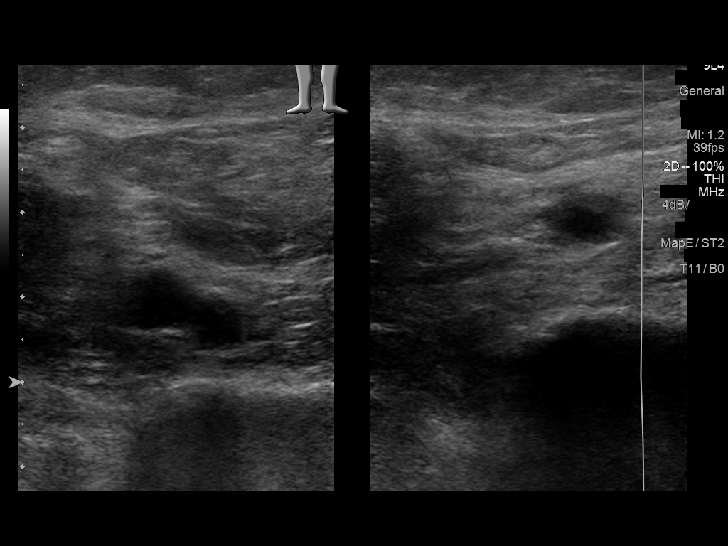
[im 26/36]
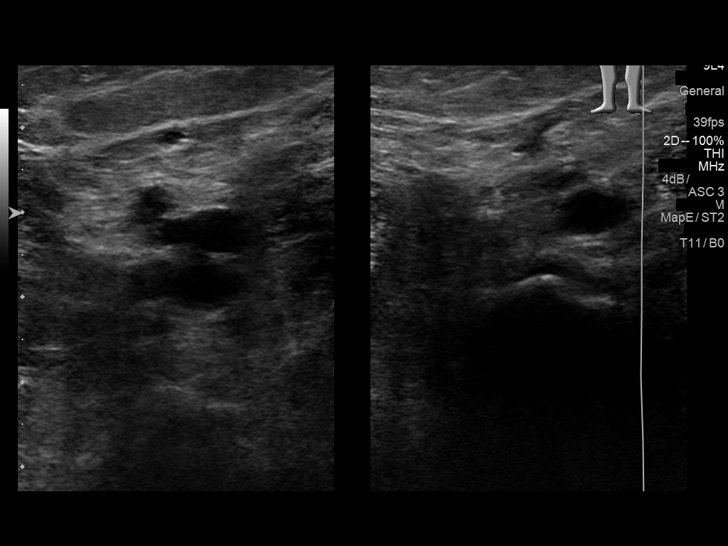
[im 29/36]
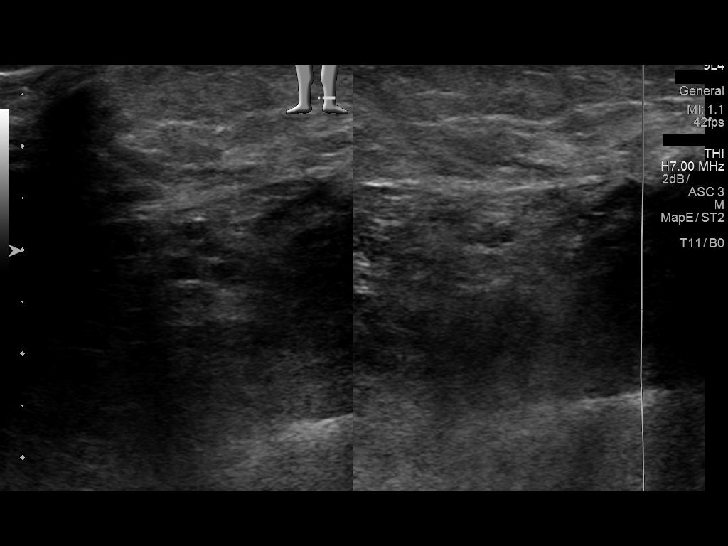
[im 32/36]
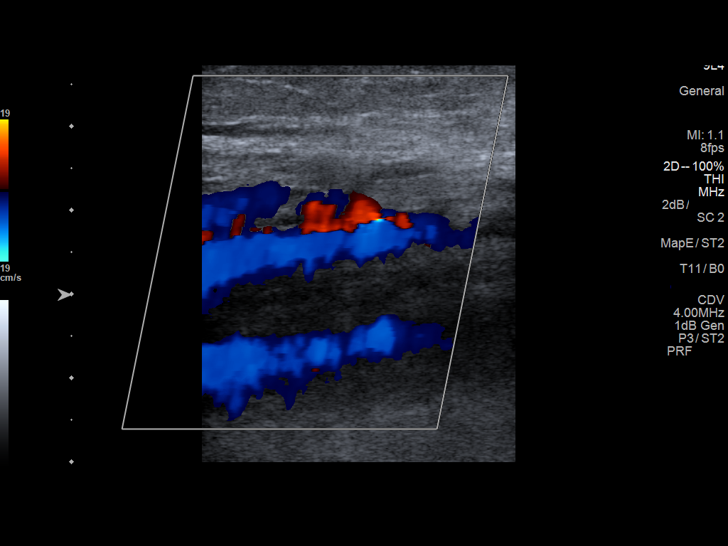
[im 36/36]
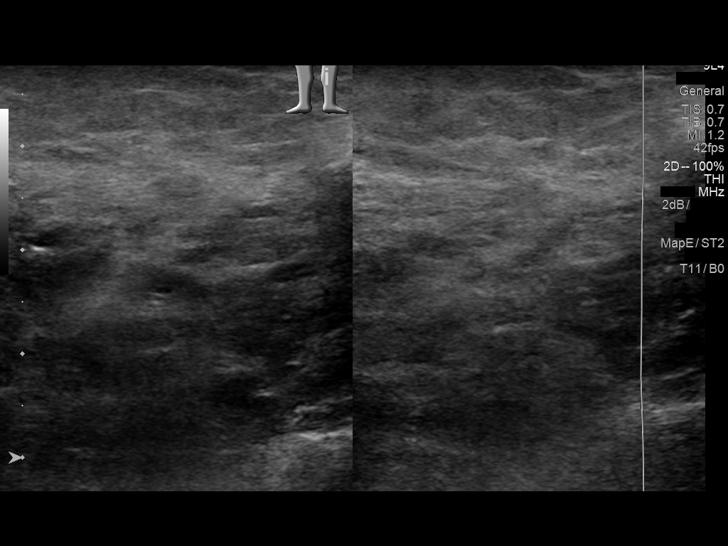

[13 of 24 positions shown; findings below may reference images not displayed]

FINDINGS: Contralateral Common Femoral Vein: Respiratory phasicity is normal
and symmetric with the symptomatic side. No evidence of thrombus.
Normal compressibility.

Common Femoral Vein: No evidence of thrombus. Normal
compressibility, respiratory phasicity and response to augmentation.

Saphenofemoral Junction: No evidence of thrombus. Normal
compressibility and flow on color Doppler imaging.

Profunda Femoral Vein: No evidence of thrombus. Normal
compressibility and flow on color Doppler imaging.

Femoral Vein: No evidence of thrombus. Normal compressibility,
respiratory phasicity and response to augmentation.

Popliteal Vein: No evidence of thrombus. Normal compressibility,
respiratory phasicity and response to augmentation.

Calf Veins: No evidence of thrombus. Normal compressibility and flow
on color Doppler imaging.

Superficial Great Saphenous Vein: No evidence of thrombus. Normal
compressibility and flow on color Doppler imaging.

Venous Reflux:  None.

Other Findings:  None.
IMPRESSION: No evidence of deep venous thrombosis.

## 2017-11-22 ENCOUNTER — Emergency Department: Payer: Medicare Other

## 2017-11-22 ENCOUNTER — Emergency Department
Admission: EM | Admit: 2017-11-22 | Discharge: 2017-11-22 | Disposition: A | Discharge status: Home / Self Care | Payer: Medicare Other | Attending: Emergency Medicine | Admitting: Emergency Medicine

## 2017-11-22 ENCOUNTER — Other Ambulatory Visit: Payer: Self-pay

## 2017-11-22 DIAGNOSIS — Z79899 Other long term (current) drug therapy: Secondary | ICD-10-CM | POA: Insufficient documentation

## 2017-11-22 DIAGNOSIS — R531 Weakness: Secondary | ICD-10-CM | POA: Diagnosis present

## 2017-11-22 DIAGNOSIS — N390 Urinary tract infection, site not specified: Secondary | ICD-10-CM | POA: Insufficient documentation

## 2017-11-22 DIAGNOSIS — I10 Essential (primary) hypertension: Secondary | ICD-10-CM | POA: Insufficient documentation

## 2017-11-22 DIAGNOSIS — Z85828 Personal history of other malignant neoplasm of skin: Secondary | ICD-10-CM | POA: Insufficient documentation

## 2017-11-22 DIAGNOSIS — Z7902 Long term (current) use of antithrombotics/antiplatelets: Secondary | ICD-10-CM | POA: Insufficient documentation

## 2017-11-22 DIAGNOSIS — E119 Type 2 diabetes mellitus without complications: Secondary | ICD-10-CM | POA: Diagnosis not present

## 2017-11-22 DIAGNOSIS — Z87891 Personal history of nicotine dependence: Secondary | ICD-10-CM | POA: Diagnosis not present

## 2017-11-22 LAB — URINALYSIS, COMPLETE (UACMP) WITH MICROSCOPIC
BACTERIA UA: NONE SEEN
BILIRUBIN URINE: NEGATIVE
Glucose, UA: NEGATIVE mg/dL
Hgb urine dipstick: NEGATIVE
Ketones, ur: NEGATIVE mg/dL
NITRITE: NEGATIVE
PROTEIN: 30 mg/dL — AB
Specific Gravity, Urine: 1.012 (ref 1.005–1.030)
pH: 6 (ref 5.0–8.0)

## 2017-11-22 LAB — CBC WITH DIFFERENTIAL/PLATELET
BASOS ABS: 0 10*3/uL (ref 0–0.1)
Basophils Relative: 1 %
Eosinophils Absolute: 0.2 10*3/uL (ref 0–0.7)
Eosinophils Relative: 3 %
HCT: 41.6 % (ref 35.0–47.0)
Hemoglobin: 14.5 g/dL (ref 12.0–16.0)
LYMPHS PCT: 12 %
Lymphs Abs: 0.9 10*3/uL — ABNORMAL LOW (ref 1.0–3.6)
MCH: 29.8 pg (ref 26.0–34.0)
MCHC: 34.9 g/dL (ref 32.0–36.0)
MCV: 85.4 fL (ref 80.0–100.0)
MONO ABS: 0.6 10*3/uL (ref 0.2–0.9)
Monocytes Relative: 8 %
NEUTROS ABS: 5.7 10*3/uL (ref 1.4–6.5)
Neutrophils Relative %: 76 %
Platelets: 178 10*3/uL (ref 150–440)
RBC: 4.87 MIL/uL (ref 3.80–5.20)
RDW: 13.7 % (ref 11.5–14.5)
WBC: 7.4 10*3/uL (ref 3.6–11.0)

## 2017-11-22 LAB — BASIC METABOLIC PANEL
Anion gap: 4 — ABNORMAL LOW (ref 5–15)
BUN: 17 mg/dL (ref 8–23)
CO2: 29 mmol/L (ref 22–32)
Calcium: 8.9 mg/dL (ref 8.9–10.3)
Chloride: 107 mmol/L (ref 98–111)
Creatinine, Ser: 0.54 mg/dL (ref 0.44–1.00)
GFR calc Af Amer: >60 mL/min (ref >60)
GLUCOSE: 130 mg/dL — AB (ref 70–99)
POTASSIUM: 3.5 mmol/L (ref 3.5–5.1)
Sodium: 140 mmol/L (ref 135–145)

## 2017-11-22 LAB — TROPONIN I: Troponin I: <0.03 ng/mL (ref <0.03)

## 2017-11-22 MED ORDER — SODIUM CHLORIDE 0.9 % IV BOLUS
500.0000 mL | Freq: Once | INTRAVENOUS | Status: AC
  Administered 2017-11-22: 500 mL via INTRAVENOUS

## 2017-11-22 NOTE — ED Notes (Signed)
Pt arrives via ems from [facility], with report of weakness increasing over the last few days .  Pt reports having a fall last week ,has seen pcp sense that time and has been dx of UTI ,on abx X3 days for same .

## 2017-11-22 NOTE — ED Triage Notes (Signed)
  Ellyn Hack, RN  Registered Nurse  Emergency Medicine  ED Notes  Signed  Date of Service:  11/22/2017 8:46 PM          Signed         Pt arrives via ems from [facility], with report of weakness increasing over the last few days .  Pt reports having a fall last week ,has seen pcp sense that time and has been dx of UTI ,on abx X3 days for same .

## 2017-11-22 NOTE — ED Notes (Signed)
Pt to ct at this time.

## 2017-11-22 NOTE — Discharge Instructions (Addendum)
Continue the antibiotic you were prescribed and finish the full course.  Follow-up with your regular doctor within a week.  Return to the ER for new, worsening, persistent severe weakness, vomiting, fevers, severe headache, or any other new or worsening symptoms that concern you.

## 2017-11-22 NOTE — ED Provider Notes (Signed)
[facility] Emergency Department Provider Note ____________________________________________   First MD Initiated Contact with Patient 11/22/17 2107     (approximate)  I have reviewed the triage vital signs and the nursing notes.   HISTORY  Chief Complaint Weakness    HPI Claire Washington is a 84 y.o. female with PMH as noted below who presents with generalized weakness over the last several days, gradual onset, persistent course, and worse when she stands up and tries to walk around.  She states that her legs feel wobbly.  She reports that she had a frontal headache a few days ago but this has improved.  She also has been treated for a UTI for the last several days.  She states that she initially saw her doctor for the generalized symptoms and has been taking an antibiotic.  She states that her urine has "cleared up" but she still feels unwell.   Past Medical History:  Diagnosis Date  . Arthritis   . Basal cell carcinoma    basel cell  . Chronic cystitis   . Diabetes mellitus type 2, controlled, without complications ([place])    type 2 or unspecified type diabetes mellitus without mention of complication, not stated as uncontrolled  . Disease of thyroid gland   . Embolic stroke ([place])    a. 07/2016 MRI Brain: small right superior cerebellum and left lateral occipital cortex infarcts;  b. 08/2016 Echo: EF 60-65%, Gr1 DD, mild MR;  c. 08/2016 30 Day Event Monitor: No significant tachy/bradyarrhythmias. No afib.  . Follicular neoplasm of thyroid   . Hemorrhage of rectum and anus   . Hyperlipidemia   . Hyperlipidemia, unspecified   . Hypertension   . Leg pain   . Osteoarthritis   . Pernicious anemia   . Small vessel disease ([place])   . Spinal stenosis, lumbar region without neurogenic claudication   . Subdural hematoma ([place])    a. 2013 in setting of syncope.  . Syncope and collapse    a. 2013 - syncope and fall w/ resultant subdural hematoma; b. prev nl  echo and stress test @ [facility].  Marland Kitchen Thyroid nodule   . Vertigo & Chronic Dizziness    a. Previously seen by ENT with Physical Therapy.  MRI 2000 small vessel disease    Patient Active Problem List   Diagnosis Date Noted  . Gait instability 10/19/2016  . Dizziness 11/03/2015  . Hypertension 12/04/2011  . Hyperlipidemia 12/04/2011  . Syncope 12/04/2011  . Diabetes mellitus ([place]) 12/04/2011  . Smoking hx 12/04/2011    Past Surgical History:  Procedure Laterality Date  . BILATERAL SALPINGOOPHORECTOMY    . COLONOSCOPY  01/26/2009   Diverticulosis  . CYSTOSCOPY    . HEMORRHOIDECTOMY BY SIMPLE LIGATION    . PR COLONOSCOPY FLX W/ENDOSCOPIC MUCOSAL RESECTION  09/02/2014   Procedure: COLONOSCOPY, FLEXIBLE; WITH ENDOSCOPIC MUCOSAL RESECTION; Surgeon: Saunders Revel, MD; Location: [facility]; Service: Gastroenterology  . PR COLSC FLX W/RMVL OF TUMOR POLYP LESION SNARE TQ  09/02/2014   Procedure: COLONOSCOPY FLEX; W/REMOV TUMOR/LES BY SNARE; Surgeon: Saunders Revel, MD; Location: [facility]; Service: Gastroenterology  . SKIN SURGERY    . TOTAL ABDOMINAL HYSTERECTOMY W/ BILATERAL SALPINGOOPHORECTOMY  1970s    Prior to Admission medications   Medication Sig Start Date End Date Taking? Authorizing Provider  amLODipine (NORVASC) 5 MG tablet Take 1 tablet (5 mg total) by mouth daily. 02/14/17  Yes Minna Merritts, MD  atorvastatin (LIPITOR)  10 MG tablet Take 1 tablet (10 mg total) by mouth daily. Patient taking differently: Take 10 mg by mouth at bedtime.  02/14/17  Yes Gollan, Kathlene November, MD  clopidogrel (PLAVIX) 75 MG tablet TAKE 1 TABLET (75 MG TOTAL) BY MOUTH DAILY. 07/31/17  Yes Minna Merritts, MD  metoprolol tartrate (LOPRESSOR) 25 MG tablet Take 0.5 tablets (12.5 mg total) by mouth 2 (two) times daily. 10/17/17  Yes Theora Gianotti, NP  Multiple Vitamins-Minerals (CENTRUM SILVER PO) Take 1 tablet by mouth daily. Reported on 06/11/2015    Yes [provider]    Allergies Hydrocortisone and Sulfa antibiotics  Family History  Problem Relation Age of Onset  . Colon cancer Mother   . Heart disease Father   . Heart attack Father   . Prostate cancer Father   . Stroke Father   . Heart attack Brother   . Asthma Daughter   . Diabetes Other        Siblings    Social History Social History   Tobacco Use  . Smoking status: Former Smoker    Packs/day: 0.25    Years: 20.00    Pack years: 5.00    Types: Cigarettes    Last attempt to quit: 09/15/1993    Years since quitting: 24.2  . Smokeless tobacco: Never Used  Substance Use Topics  . Alcohol use: Yes    Alcohol/week: 2.0 standard drinks    Types: 1 Glasses of wine, 1 Shots of liquor per week    Comment: occassional  . Drug use: No    Review of Systems  Constitutional: No fever. Eyes: No visual changes. ENT: No sore throat. Cardiovascular: Denies chest pain. Respiratory: Denies shortness of breath. Gastrointestinal: No vomiting or diarrhea.  Genitourinary: Negative for dysuria.  Musculoskeletal: Negative for back pain. Skin: Negative for rash. Neurological: Positive for intermittent headache.   ____________________________________________   PHYSICAL EXAM:  VITAL SIGNS: ED Triage Vitals  Enc Vitals Group     BP 11/22/17 2040 (!) 161/58     Pulse Rate 11/22/17 2040 90     Resp 11/22/17 2040 16     Temp 11/22/17 2040 98.2 F (36.8 C)     Temp Source 11/22/17 2040 Oral     SpO2 11/22/17 2040 95 %     Weight 11/22/17 2041 114 lb (51.7 kg)     Height 11/22/17 2041 4\' 11"  (1.499 m)     Head Circumference --      Peak Flow --      Pain Score 11/22/17 2041 0     Pain Loc --      Pain Edu? --      Excl. in [place]? --     Constitutional: Alert and oriented. Well appearing for age and in no acute distress. Eyes: Conjunctivae are normal.  EOMI.  PERRLA. Head: Atraumatic. Nose: No congestion/rhinnorhea. Mouth/Throat: Mucous membranes are  slightly dry.   Neck: Normal range of motion.  Cardiovascular: Normal rate, regular rhythm. Grossly normal heart sounds.  Good peripheral circulation. Respiratory: Normal respiratory effort.  No retractions. Lungs CTAB. Gastrointestinal: Soft and nontender. No distention.  Genitourinary: No flank tenderness. Musculoskeletal: No lower extremity edema.  Extremities warm and well perfused.  Neurologic:  Normal speech and language.  Motor and sensory intact in all extremities.  Normal coordination with no ataxia.  No gross focal neurologic deficits are appreciated.  Skin:  Skin is warm and dry. No rash noted. Psychiatric: Mood and affect are normal. Speech and  behavior are normal.  ____________________________________________   LABS (all labs ordered are listed, but only abnormal results are displayed)  Labs Reviewed  BASIC METABOLIC PANEL - Abnormal; Notable for the following components:      Result Value   Glucose, Bld 130 (*)    Anion gap 4 (*)    All other components within normal limits  CBC WITH DIFFERENTIAL/PLATELET - Abnormal; Notable for the following components:   Lymphs Abs 0.9 (*)    All other components within normal limits  URINALYSIS, COMPLETE (UACMP) WITH MICROSCOPIC - Abnormal; Notable for the following components:   Color, Urine YELLOW (*)    APPearance CLEAR (*)    Protein, ur 30 (*)    Leukocytes, UA MODERATE (*)    All other components within normal limits  TROPONIN I   ____________________________________________  EKG  ED ECG REPORT I, Arta Silence, the attending physician, personally viewed and interpreted this ECG.  Date: 11/22/2017 EKG Time: 2050 Rate: 88 Rhythm: normal sinus rhythm QRS Axis: normal Intervals: normal ST/T Wave abnormalities: normal Narrative Interpretation: no evidence of acute ischemia  ____________________________________________  RADIOLOGY  CT head: No acute stroke or other acute  abnormalities ____________________________________________   PROCEDURES  Procedure(s) performed: No  Procedures  Critical Care performed: No ____________________________________________   INITIAL IMPRESSION / ASSESSMENT AND PLAN / ED COURSE  Pertinent labs & imaging results that were available during my care of the patient were reviewed by me and considered in my medical decision making (see chart for details).  84 year old female with PMH as noted above presents with generalized weakness over the last several days, especially feel lightheaded when she stands up.  She reports some intermittent headache, and states that she has been treated for UTI although she states that her urinary symptoms are improving.  On exam, the patient is slightly hypertensive but otherwise has normal vital signs, and the remainder of the exam is as described above.  She is remarkably well-appearing for her age.  Neuro exam is nonfocal.  Overall I suspect most likely mild dehydration, electrolyte abnormality, persistent symptoms related to the UTI, or less likely acute neuro cause.  Will obtain basic labs, UA, give a fluid bolus, and obtain CT head.  If the work-up is negative, anticipate discharge home.  ----------------------------------------- 11:11 PM on 11/22/2017 -----------------------------------------  CT and lab work-up are unremarkable.  The patient's UA is consistent with UTI.  She does not know which antibiotic she was put on although at this time given that she is only taken it for a couple of days, there is no evidence of resistant UTI or indication to change her antibiotic regimen.  Overall I suspect that the UTI is the most likely reason for the patient's nonspecific symptoms.  Given her normal vital signs, well appearance, and reassuring lab work-up, she is stable for discharge home.  I instructed her to continue the antibiotic and follow-up with her regular doctor.  Return precautions  given, and she expressed understanding.  ____________________________________________   FINAL CLINICAL IMPRESSION(S) / ED DIAGNOSES  Final diagnoses:  Generalized weakness  Urinary tract infection without hematuria, site unspecified      NEW MEDICATIONS STARTED DURING THIS VISIT:  New Prescriptions   No medications on file     Note:  This document was prepared using Dragon voice recognition software and may include unintentional dictation errors.    Arta Silence, MD 11/22/17 2312

## 2017-11-30 ENCOUNTER — Other Ambulatory Visit: Payer: Self-pay

## 2017-11-30 ENCOUNTER — Emergency Department: Payer: Medicare Other

## 2017-11-30 ENCOUNTER — Observation Stay
Admission: EM | Admit: 2017-11-30 | Discharge: 2017-12-01 | Disposition: A | Discharge status: Home / Self Care | Payer: Medicare Other | Attending: Internal Medicine | Admitting: Internal Medicine

## 2017-11-30 ENCOUNTER — Encounter: Payer: Self-pay | Admitting: Emergency Medicine

## 2017-11-30 DIAGNOSIS — Z87891 Personal history of nicotine dependence: Secondary | ICD-10-CM | POA: Insufficient documentation

## 2017-11-30 DIAGNOSIS — S51012A Laceration without foreign body of left elbow, initial encounter: Secondary | ICD-10-CM | POA: Insufficient documentation

## 2017-11-30 DIAGNOSIS — Z8673 Personal history of transient ischemic attack (TIA), and cerebral infarction without residual deficits: Secondary | ICD-10-CM | POA: Insufficient documentation

## 2017-11-30 DIAGNOSIS — X58XXXA Exposure to other specified factors, initial encounter: Secondary | ICD-10-CM | POA: Insufficient documentation

## 2017-11-30 DIAGNOSIS — Z85828 Personal history of other malignant neoplasm of skin: Secondary | ICD-10-CM | POA: Diagnosis not present

## 2017-11-30 DIAGNOSIS — E785 Hyperlipidemia, unspecified: Secondary | ICD-10-CM | POA: Diagnosis not present

## 2017-11-30 DIAGNOSIS — I10 Essential (primary) hypertension: Secondary | ICD-10-CM | POA: Diagnosis not present

## 2017-11-30 DIAGNOSIS — R42 Dizziness and giddiness: Secondary | ICD-10-CM | POA: Diagnosis not present

## 2017-11-30 DIAGNOSIS — Z7902 Long term (current) use of antithrombotics/antiplatelets: Secondary | ICD-10-CM | POA: Insufficient documentation

## 2017-11-30 DIAGNOSIS — Z79899 Other long term (current) drug therapy: Secondary | ICD-10-CM | POA: Diagnosis not present

## 2017-11-30 DIAGNOSIS — E119 Type 2 diabetes mellitus without complications: Secondary | ICD-10-CM | POA: Insufficient documentation

## 2017-11-30 LAB — URINALYSIS, COMPLETE (UACMP) WITH MICROSCOPIC
BILIRUBIN URINE: NEGATIVE
Bacteria, UA: NONE SEEN
GLUCOSE, UA: NEGATIVE mg/dL
HGB URINE DIPSTICK: NEGATIVE
KETONES UR: NEGATIVE mg/dL
LEUKOCYTES UA: NEGATIVE
NITRITE: NEGATIVE
Protein, ur: NEGATIVE mg/dL
Specific Gravity, Urine: 1.009 (ref 1.005–1.030)
pH: 6 (ref 5.0–8.0)

## 2017-11-30 LAB — CBC
HEMATOCRIT: 42.7 % (ref 35.0–47.0)
HEMOGLOBIN: 14.9 g/dL (ref 12.0–16.0)
MCH: 30 pg (ref 26.0–34.0)
MCHC: 34.9 g/dL (ref 32.0–36.0)
MCV: 86 fL (ref 80.0–100.0)
Platelets: 223 10*3/uL (ref 150–440)
RBC: 4.97 MIL/uL (ref 3.80–5.20)
RDW: 13.7 % (ref 11.5–14.5)
WBC: 7.4 10*3/uL (ref 3.6–11.0)

## 2017-11-30 LAB — BASIC METABOLIC PANEL
ANION GAP: 8 (ref 5–15)
BUN: 16 mg/dL (ref 8–23)
CALCIUM: 9 mg/dL (ref 8.9–10.3)
CO2: 27 mmol/L (ref 22–32)
Chloride: 104 mmol/L (ref 98–111)
Creatinine, Ser: 0.48 mg/dL (ref 0.44–1.00)
Glucose, Bld: 133 mg/dL — ABNORMAL HIGH (ref 70–99)
POTASSIUM: 4 mmol/L (ref 3.5–5.1)
Sodium: 139 mmol/L (ref 135–145)

## 2017-11-30 LAB — LACTIC ACID, PLASMA
LACTIC ACID, VENOUS: 1 mmol/L (ref 0.5–1.9)
Lactic Acid, Venous: 1.1 mmol/L (ref 0.5–1.9)

## 2017-11-30 LAB — TROPONIN I: Troponin I: <0.03 ng/mL (ref <0.03)

## 2017-11-30 LAB — MAGNESIUM: Magnesium: 1.9 mg/dL (ref 1.7–2.4)

## 2017-11-30 MED ORDER — ONDANSETRON HCL 4 MG/2ML IJ SOLN: 4.0000 mg | Freq: Four times a day (QID) | INTRAMUSCULAR | Status: DC | PRN

## 2017-11-30 MED ORDER — SENNOSIDES-DOCUSATE SODIUM 8.6-50 MG PO TABS: 1.0000 | ORAL_TABLET | Freq: Every evening | ORAL | Status: DC | PRN

## 2017-11-30 MED ORDER — CLOPIDOGREL BISULFATE 75 MG PO TABS
75.0000 mg | ORAL_TABLET | Freq: Every day | ORAL | Status: DC
  Administered 2017-11-30 – 2017-12-01 (×2): 75 mg via ORAL
  Filled 2017-11-30 (×2): qty 1

## 2017-11-30 MED ORDER — HYDROCODONE-ACETAMINOPHEN 5-325 MG PO TABS: 1.0000 | ORAL_TABLET | ORAL | Status: DC | PRN

## 2017-11-30 MED ORDER — ATORVASTATIN CALCIUM 20 MG PO TABS
10.0000 mg | ORAL_TABLET | Freq: Every day | ORAL | Status: DC
  Administered 2017-11-30: 10 mg via ORAL
  Filled 2017-11-30: qty 1

## 2017-11-30 MED ORDER — METOPROLOL TARTRATE 25 MG PO TABS
12.5000 mg | ORAL_TABLET | Freq: Two times a day (BID) | ORAL | Status: DC
  Administered 2017-11-30 – 2017-12-01 (×2): 12.5 mg via ORAL
  Filled 2017-11-30 (×2): qty 1

## 2017-11-30 MED ORDER — ACETAMINOPHEN 650 MG RE SUPP: 650.0000 mg | Freq: Four times a day (QID) | RECTAL | Status: DC | PRN

## 2017-11-30 MED ORDER — ALBUTEROL SULFATE (2.5 MG/3ML) 0.083% IN NEBU: 2.5000 mg | INHALATION_SOLUTION | RESPIRATORY_TRACT | Status: DC | PRN

## 2017-11-30 MED ORDER — SODIUM CHLORIDE 0.9 % IV SOLN
INTRAVENOUS | Status: DC
  Administered 2017-11-30: 18:00:00 via INTRAVENOUS

## 2017-11-30 MED ORDER — BISACODYL 5 MG PO TBEC: 5.0000 mg | DELAYED_RELEASE_TABLET | Freq: Every day | ORAL | Status: DC | PRN

## 2017-11-30 MED ORDER — ACETAMINOPHEN 325 MG PO TABS: 650.0000 mg | ORAL_TABLET | Freq: Four times a day (QID) | ORAL | Status: DC | PRN

## 2017-11-30 MED ORDER — ENOXAPARIN SODIUM 40 MG/0.4ML ~~LOC~~ SOLN
40.0000 mg | SUBCUTANEOUS | Status: DC
  Administered 2017-11-30: 40 mg via SUBCUTANEOUS
  Filled 2017-11-30: qty 0.4

## 2017-11-30 MED ORDER — MECLIZINE HCL 25 MG PO TABS
25.0000 mg | ORAL_TABLET | Freq: Three times a day (TID) | ORAL | Status: DC
  Filled 2017-11-30 (×4): qty 1

## 2017-11-30 MED ORDER — DOXYCYCLINE HYCLATE 100 MG PO TABS
100.0000 mg | ORAL_TABLET | Freq: Two times a day (BID) | ORAL | Status: DC
  Administered 2017-11-30 – 2017-12-01 (×2): 100 mg via ORAL
  Filled 2017-11-30 (×3): qty 1

## 2017-11-30 MED ORDER — DIAZEPAM 2 MG PO TABS
2.0000 mg | ORAL_TABLET | Freq: Once | ORAL | Status: AC
Start: 2017-11-30 — End: 2017-11-30
  Administered 2017-11-30: 2 mg via ORAL
  Filled 2017-11-30: qty 1

## 2017-11-30 MED ORDER — ONDANSETRON HCL 4 MG PO TABS: 4.0000 mg | ORAL_TABLET | Freq: Four times a day (QID) | ORAL | Status: DC | PRN

## 2017-11-30 MED ORDER — AMLODIPINE BESYLATE 5 MG PO TABS
5.0000 mg | ORAL_TABLET | Freq: Every day | ORAL | Status: DC
  Administered 2017-12-01: 09:00:00 5 mg via ORAL
  Filled 2017-11-30: qty 1

## 2017-11-30 NOTE — H&P (Signed)
[place] [facility] [place] NAME: Claire Washington    MR#:  [id number]  DATE OF BIRTH:  [birth year]  DATE OF ADMISSION:  11/30/2017  PRIMARY CARE PHYSICIAN: Tracie Harrier, MD   REQUESTING/REFERRING PHYSICIAN: Dr. Clearnce Hasten.  CHIEF COMPLAINT:  No chief complaint on file.  Worsening dizziness. HISTORY OF PRESENT ILLNESS:  Claire Washington  is a 84 y.o. female with a known history of multiple medical problems as below.  The patient presents the ED with above chief complaints.  The patient has a history of vertigo, which has been worsening since yesterday after PT.  She is not able to walk secondary to spinning sensation.  She denies any other symptoms.  She was recently treated with antibiotics for UTI.  Urinalysis is normal today.  Dr. Clearnce Hasten request admission.  PAST MEDICAL HISTORY:   Past Medical History:  Diagnosis Date  . Arthritis   . Basal cell carcinoma    basel cell  . Chronic cystitis   . Diabetes mellitus type 2, controlled, without complications ([place])    type 2 or unspecified type diabetes mellitus without mention of complication, not stated as uncontrolled  . Disease of thyroid gland   . Embolic stroke ([place])    a. 07/2016 MRI Brain: small right superior cerebellum and left lateral occipital cortex infarcts;  b. 08/2016 Echo: EF 60-65%, Gr1 DD, mild MR;  c. 08/2016 30 Day Event Monitor: No significant tachy/bradyarrhythmias. No afib.  . Follicular neoplasm of thyroid   . Hemorrhage of rectum and anus   . Hyperlipidemia   . Hyperlipidemia, unspecified   . Hypertension   . Leg pain   . Osteoarthritis   . Pernicious anemia   . Small vessel disease ([place])   . Spinal stenosis, lumbar region without neurogenic claudication   . Subdural hematoma ([place])    a. 2013 in setting of syncope.  . Syncope and collapse    a. 2013 - syncope and fall w/ resultant subdural hematoma; b. prev nl echo and stress test @ [facility].  Marland Kitchen Thyroid nodule     . Vertigo & Chronic Dizziness    a. Previously seen by ENT with Physical Therapy.  MRI 2000 small vessel disease    PAST SURGICAL HISTORY:   Past Surgical History:  Procedure Laterality Date  . BILATERAL SALPINGOOPHORECTOMY    . COLONOSCOPY  01/26/2009   Diverticulosis  . CYSTOSCOPY    . HEMORRHOIDECTOMY BY SIMPLE LIGATION    . PR COLONOSCOPY FLX W/ENDOSCOPIC MUCOSAL RESECTION  09/02/2014   Procedure: COLONOSCOPY, FLEXIBLE; WITH ENDOSCOPIC MUCOSAL RESECTION; Surgeon: Saunders Revel, MD; Location: [facility]; Service: Gastroenterology  . PR COLSC FLX W/RMVL OF TUMOR POLYP LESION SNARE TQ  09/02/2014   Procedure: COLONOSCOPY FLEX; W/REMOV TUMOR/LES BY SNARE; Surgeon: Saunders Revel, MD; [facility]: [facility]; Service: Gastroenterology  . SKIN SURGERY    . TOTAL ABDOMINAL HYSTERECTOMY W/ BILATERAL SALPINGOOPHORECTOMY  1970s    SOCIAL HISTORY:   Social History   Tobacco Use  . Smoking status: Former Smoker    Packs/day: 0.25    Years: 20.00    Pack years: 5.00    Types: Cigarettes    Last attempt to quit: 09/15/1993    Years since quitting: 24.2  . Smokeless tobacco: Never Used  Substance Use Topics  . Alcohol use: Yes    Alcohol/week: 2.0 standard drinks    Types: 1 Glasses of wine, 1 Shots of liquor per week  Comment: occassional    FAMILY HISTORY:   Family History  Problem Relation Age of Onset  . Colon cancer Mother   . Heart disease Father   . Heart attack Father   . Prostate cancer Father   . Stroke Father   . Heart attack Brother   . Asthma Daughter   . Diabetes Other        Siblings    DRUG ALLERGIES:   Allergies  Allergen Reactions  . Hydrocortisone Swelling  . Sulfa Antibiotics Other (See Comments)    Reaction: unknown    REVIEW OF SYSTEMS:   Review of Systems  Constitutional: Negative for chills, fever and malaise/fatigue.  HENT: Negative for sore throat.   Eyes: Negative for blurred vision and double  vision.  Respiratory: Negative for cough, hemoptysis, shortness of breath, wheezing and stridor.   Cardiovascular: Negative for chest pain, palpitations, orthopnea and leg swelling.  Gastrointestinal: Negative for abdominal pain, blood in stool, diarrhea, melena, nausea and vomiting.  Genitourinary: Negative for dysuria, flank pain and hematuria.  Musculoskeletal: Negative for back pain and joint pain.  Skin: Negative for rash.  Neurological: Positive for dizziness. Negative for sensory change, focal weakness, seizures, loss of consciousness, weakness and headaches.  Endo/Heme/Allergies: Negative for polydipsia.  Psychiatric/Behavioral: Negative for depression. The patient is not nervous/anxious.     MEDICATIONS AT HOME:   Prior to Admission medications   Medication Sig Start Date End Date Taking? Authorizing Provider  amLODipine (NORVASC) 5 MG tablet Take 1 tablet (5 mg total) by mouth daily. 02/14/17  Yes Gollan, Kathlene November, MD  atorvastatin (LIPITOR) 10 MG tablet Take 1 tablet (10 mg total) by mouth daily. Patient taking differently: Take 10 mg by mouth at bedtime.  02/14/17  Yes Gollan, Kathlene November, MD  clopidogrel (PLAVIX) 75 MG tablet TAKE 1 TABLET (75 MG TOTAL) BY MOUTH DAILY. 07/31/17  Yes Gollan, Kathlene November, MD  doxycycline (VIBRAMYCIN) 100 MG capsule Take 100 mg by mouth 2 (two) times daily. 11/26/17  Yes [provider]  metoprolol tartrate (LOPRESSOR) 25 MG tablet Take 0.5 tablets (12.5 mg total) by mouth 2 (two) times daily. 10/17/17  Yes Theora Gianotti, NP  Multiple Vitamins-Minerals (CENTRUM SILVER PO) Take 1 tablet by mouth daily. Reported on 06/11/2015   Yes [provider]      VITAL SIGNS:  Blood pressure (!) 149/56, pulse 70, temperature 98.2 F (36.8 C), temperature source Oral, resp. rate 20, SpO2 95 %.  PHYSICAL EXAMINATION:  Physical Exam  GENERAL:  84 y.o.-year-old patient lying in the bed with no acute distress.  EYES: Pupils equal,  round, reactive to light and accommodation. No scleral icterus. Extraocular muscles intact.  HEENT: Head atraumatic, normocephalic. Oropharynx and nasopharynx clear.  NECK:  Supple, no jugular venous distention. No thyroid enlargement, no tenderness.  LUNGS: Normal breath sounds bilaterally, no wheezing, rales,rhonchi or crepitation. No use of accessory muscles of respiration.  CARDIOVASCULAR: S1, S2 normal. No murmurs, rubs, or gallops.  ABDOMEN: Soft, nontender, nondistended. Bowel sounds present. No organomegaly or mass.  EXTREMITIES: No pedal edema, cyanosis, or clubbing.  Left elbow healing wound. NEUROLOGIC: Cranial nerves II through XII are intact. Muscle strength 5/5 in all extremities. Sensation intact. Gait not checked.  PSYCHIATRIC: The patient is alert and oriented x 3.  SKIN: No obvious rash, lesion, or ulcer.   LABORATORY PANEL:   CBC Recent Labs  Lab 11/30/17 0953  WBC 7.4  HGB 14.9  HCT 42.7  PLT 223   ------------------------------------------------------------------------------------------------------------------  Chemistries  Recent Labs  Lab 11/30/17 0953  NA 139  K 4.0  CL 104  CO2 27  GLUCOSE 133*  BUN 16  CREATININE 0.48  CALCIUM 9.0   ------------------------------------------------------------------------------------------------------------------  Cardiac Enzymes Recent Labs  Lab 11/30/17 0953  TROPONINI <0.03   ------------------------------------------------------------------------------------------------------------------  RADIOLOGY:  Mr Brain Wo Contrast  Result Date: 11/30/2017 CLINICAL DATA:  84 year old female with persistent dizziness/vertigo. EXAM: MRI HEAD WITHOUT CONTRAST TECHNIQUE: Multiplanar, multiecho pulse sequences of the brain and surrounding structures were obtained without intravenous contrast. COMPARISON:  Head CT without contrast 11/22/2017. Brain MRI 10/20/2016. FINDINGS: Brain: No restricted diffusion to suggest acute  infarction. No midline shift, mass effect, evidence of mass lesion, ventriculomegaly, extra-axial collection or acute intracranial hemorrhage. Cervicomedullary junction and pituitary are within normal limits. Stable cerebral volume since 2018. Patchy and confluent chronic cerebral white matter T2 and FLAIR hyperintensity is stable. No cortical encephalomalacia. No chronic cerebral blood products. Brainstem and cerebellum remain normal for age. Stable mild for age T2 heterogeneity in the deep gray matter, which mostly resembles perivascular spaces (normal variant). Vascular: Major intracranial vascular flow voids are stable since 2018. Skull and upper cervical spine: Negative for age visible cervical spine. Visualized bone marrow signal is within normal limits. Sinuses/Orbits: Stable and negative. Other: Mastoid air cells remain clear. Visible internal auditory structures appear normal. Scalp and face soft tissues appear negative. IMPRESSION: 1.  No acute intracranial abnormality. 2. Stable noncontrast MRI appearance of the brain, with no evidence of significant prior posterior circulation ischemia. Electronically Signed   By: Genevie Ann M.D.   On: 11/30/2017 14:36      IMPRESSION AND PLAN:   Vertigo. Patient will be placed for observation.  Start [place], ENT consult.  Fall precaution.  Hypertension.  Continue home hypertension medication. History of CVA.  Continue Plavix and Lipitor.  All the records are reviewed and case discussed with ED provider. Management plans discussed with the patient, family and they are in agreement.  CODE STATUS: Full code.  TOTAL TIME TAKING CARE OF THIS PATIENT: 38 minutes.    Demetrios Loll M.D on 11/30/2017 at 4:27 PM  Between 7am to 6pm - Pager - [phone]  After 6pm go to [email or web address] - Proofreader  [facility]  Office  [phone]  CC: Primary care physician; Tracie Harrier, MD   Note: This dictation was prepared  with Dragon dictation along with smaller phrase technology. Any transcriptional errors that result from this process are unin

## 2017-11-30 NOTE — ED Triage Notes (Signed)
See other note

## 2017-11-30 NOTE — ED Provider Notes (Signed)
[facility] Emergency Department Provider Note ____________________________________________   First MD Initiated Contact with Patient 11/30/17 1110     (approximate)  I have reviewed the triage vital signs and the nursing notes.   HISTORY  Chief Complaint No chief complaint on file.   HPI Claire Washington is a 84 y.o. female your vertigo as well as CVAs on Plavix was presenting to the emergency department today complaining of dizziness.  She says that she had what sounds like an Epley maneuver done yesterday by physical therapist and now it has worsened her dizziness.  She says that she is unable to walk secondary to a spinning sensation.  Denying any weakness or numbness.  Says that she has taken meclizine at home without relief.  Says that she lives by herself.  Recent trip to the hospital diagnosis of UTI with prescription of antibiotics but without resolution of the symptoms.  Patient states that she has made a follow-up appoint with her ENT but cannot see her ENT for another week.  Past Medical History:  Diagnosis Date  . Arthritis   . Basal cell carcinoma    basel cell  . Chronic cystitis   . Diabetes mellitus type 2, controlled, without complications ([place])    type 2 or unspecified type diabetes mellitus without mention of complication, not stated as uncontrolled  . Disease of thyroid gland   . Embolic stroke ([place])    a. 07/2016 MRI Brain: small right superior cerebellum and left lateral occipital cortex infarcts;  b. 08/2016 Echo: EF 60-65%, Gr1 DD, mild MR;  c. 08/2016 30 Day Event Monitor: No significant tachy/bradyarrhythmias. No afib.  . Follicular neoplasm of thyroid   . Hemorrhage of rectum and anus   . Hyperlipidemia   . Hyperlipidemia, unspecified   . Hypertension   . Leg pain   . Osteoarthritis   . Pernicious anemia   . Small vessel disease ([place])   . Spinal stenosis, lumbar region without neurogenic claudication   . Subdural hematoma  ([place])    a. 2013 in setting of syncope.  . Syncope and collapse    a. 2013 - syncope and fall w/ resultant subdural hematoma; b. prev nl echo and stress test @ [facility].  Marland Kitchen Thyroid nodule   . Vertigo & Chronic Dizziness    a. Previously seen by ENT with Physical Therapy.  MRI 2000 small vessel disease    Patient Active Problem List   Diagnosis Date Noted  . Gait instability 10/19/2016  . Dizziness 11/03/2015  . Hypertension 12/04/2011  . Hyperlipidemia 12/04/2011  . Syncope 12/04/2011  . Diabetes mellitus ([place]) 12/04/2011  . Smoking hx 12/04/2011    Past Surgical History:  Procedure Laterality Date  . BILATERAL SALPINGOOPHORECTOMY    . COLONOSCOPY  01/26/2009   Diverticulosis  . CYSTOSCOPY    . HEMORRHOIDECTOMY BY SIMPLE LIGATION    . PR COLONOSCOPY FLX W/ENDOSCOPIC MUCOSAL RESECTION  09/02/2014   Procedure: COLONOSCOPY, FLEXIBLE; WITH ENDOSCOPIC MUCOSAL RESECTION; Surgeon: Saunders Revel, MD; [facility]; Service: Gastroenterology  . PR COLSC FLX W/RMVL OF TUMOR POLYP LESION SNARE TQ  09/02/2014   Procedure: COLONOSCOPY FLEX; W/REMOV TUMOR/LES BY SNARE; Surgeon: Saunders Revel, MD; Location: [facility]; Service: Gastroenterology  . SKIN SURGERY    . TOTAL ABDOMINAL HYSTERECTOMY W/ BILATERAL SALPINGOOPHORECTOMY  1970s    Prior to Admission medications   Medication Sig Start Date End Date Taking? Authorizing Provider  amLODipine (NORVASC) 5 MG  tablet Take 1 tablet (5 mg total) by mouth daily. 02/14/17  Yes Gollan, Kathlene November, MD  atorvastatin (LIPITOR) 10 MG tablet Take 1 tablet (10 mg total) by mouth daily. Patient taking differently: Take 10 mg by mouth at bedtime.  02/14/17  Yes Gollan, Kathlene November, MD  clopidogrel (PLAVIX) 75 MG tablet TAKE 1 TABLET (75 MG TOTAL) BY MOUTH DAILY. 07/31/17  Yes Gollan, Kathlene November, MD  doxycycline (VIBRAMYCIN) 100 MG capsule Take 100 mg by mouth 2 (two) times daily. 11/26/17  Yes [provider]  metoprolol tartrate (LOPRESSOR) 25 MG tablet Take 0.5 tablets (12.5 mg total) by mouth 2 (two) times daily. 10/17/17  Yes Theora Gianotti, NP  Multiple Vitamins-Minerals (CENTRUM SILVER PO) Take 1 tablet by mouth daily. Reported on 06/11/2015   Yes [provider]    Allergies Hydrocortisone and Sulfa antibiotics  Family History  Problem Relation Age of Onset  . Colon cancer Mother   . Heart disease Father   . Heart attack Father   . Prostate cancer Father   . Stroke Father   . Heart attack Brother   . Asthma Daughter   . Diabetes Other        Siblings    Social History Social History   Tobacco Use  . Smoking status: Former Smoker    Packs/day: 0.25    Years: 20.00    Pack years: 5.00    Types: Cigarettes    Last attempt to quit: 09/15/1993    Years since quitting: 24.2  . Smokeless tobacco: Never Used  Substance Use Topics  . Alcohol use: Yes    Alcohol/week: 2.0 standard drinks    Types: 1 Glasses of wine, 1 Shots of liquor per week    Comment: occassional  . Drug use: No    Review of Systems  Constitutional: No fever/chills Eyes: No visual changes. ENT: No sore throat. Cardiovascular: Denies chest pain. Respiratory: Denies shortness of breath. Gastrointestinal: No abdominal pain.  No nausea, no vomiting.  No diarrhea.  No constipation. Genitourinary: Negative for dysuria. Musculoskeletal: Negative for back pain. Skin: Negative for rash. Neurological: Negative for focal weakness or numbness.   ____________________________________________   PHYSICAL EXAM:  VITAL SIGNS: ED Triage Vitals  Enc Vitals Group     BP 11/30/17 0938 (!) 165/59     Pulse Rate 11/30/17 0938 72     Resp 11/30/17 0938 18     Temp 11/30/17 0938 98.2 F (36.8 C)     Temp Source 11/30/17 0938 Oral     SpO2 11/30/17 0938 98 %     Weight --      Height --      Head Circumference --      Peak Flow --      Pain Score 11/30/17 0944 4     Pain Loc --       Pain Edu? --      Excl. in [place]? --     Constitutional: Alert and oriented. Well appearing and in no acute distress. Eyes: Conjunctivae are normal.  Head: Atraumatic.  Normal TMs bilaterally. Nose: No congestion/rhinnorhea. Mouth/Throat: Mucous membranes are moist.  Neck: No stridor.   Cardiovascular: Normal rate, regular rhythm. Grossly normal heart sounds.   Respiratory: Normal respiratory effort.  No retractions. Lungs CTAB. Gastrointestinal: Soft and nontender. No distention. No CVA tenderness. Musculoskeletal: No lower extremity tenderness nor edema.  No joint effusions. Neurologic:  Normal speech and language. No gross focal neurologic deficits are appreciated.  No ataxia on finger-nose testing nor is there any ataxia on heel-to-shin testing.  No nystagmus.   Skin: Mildly erythematous and cracked skin overlying the left elbow without any fluctuance.  No exudate. Psychiatric: Mood and affect are normal. Speech and behavior are normal.  ____________________________________________   LABS (all labs ordered are listed, but only abnormal results are displayed)  Labs Reviewed  BASIC METABOLIC PANEL - Abnormal; Notable for the following components:      Result Value   Glucose, Bld 133 (*)    All other components within normal limits  URINALYSIS, COMPLETE (UACMP) WITH MICROSCOPIC - Abnormal; Notable for the following components:   Color, Urine YELLOW (*)    APPearance CLEAR (*)    All other components within normal limits  CBC  LACTIC ACID, PLASMA  LACTIC ACID, PLASMA  TROPONIN I   ____________________________________________  EKG  ED ECG REPORT I, Doran Stabler, the attending physician, personally viewed and interpreted this ECG.   Date: 11/30/2017  EKG Time: 0955  Rate: 71  Rhythm: normal sinus rhythm  Axis: Normal  Intervals:none  ST&T Change: No ST segment elevation or depression.  T wave inversions in aVL as well as V2. No significant change from  previous. ____________________________________________  RADIOLOGY  No acute abnormality on the MRI. ____________________________________________   PROCEDURES  Procedure(s) performed:   Procedures  Critical Care performed:   ____________________________________________   INITIAL IMPRESSION / ASSESSMENT AND PLAN / ED COURSE  Pertinent labs & imaging results that were available during my care of the patient were reviewed by me and considered in my medical decision making (see chart for details).  DX: Vertigo, vertebrobasilar insufficiency, peripheral vertigo, central vertigo, vertebral dissection, less light abnormality, UTI As part of my medical decision making, I reviewed the following data within the electronic MEDICAL RECORD NUMBER Notes from prior ED visits  ----------------------------------------- 3:58 PM on 11/30/2017 -----------------------------------------  Patient still vertiginous and needing assistance to walk despite Valium.  Patient had also previously received meclizine and fluids and was recently treated for UTI.  I discussed the case with neurology, Dr. Doy Mince, who does not have any further recommendations.  Discussed case with Dr. Kathyrn Sheriff, of ear nose and throat, who recommends a dose of steroids and IV fluids.  Patient to be admitted to the hospital.  Signed out to Dr. Estanislado Pandy.  Patient also concerned about nonhealing wound to the left elbow.  I will put in a consult for wound care.  Patient says that she is currently on antibiotic for the left elbow wound, question doxycycline.   ____________________________________________   FINAL CLINICAL IMPRESSION(S) / ED DIAGNOSES  Vertigo.  Left elbow wound.  NEW MEDICATIONS STARTED DURING THIS VISIT:  New Prescriptions   No medications on file     Note:  This document was prepared using Dragon voice recognition software and may include unintentional dictation errors.     Orbie Pyo, MD 11/30/17  [phone]

## 2017-11-30 NOTE — ED Triage Notes (Signed)
C/O dizziness.  States "i'm a little dizzy all of the time because I have an inner ear thing.  My therapist gave me a maneuver yesterday, which may have made it worse."  Patient's daughter states was seen through ED for similar symptoms 10 days ago.  Patient states the symptoms have not really improved, but may have worsened this morning.  Patient also has a current infection to right arm and a recent UTI.  Patient is AAOx3.  Skin warm and dry.  MAE equally and strong.  Facial movement is equal. Speech clear.  NAD

## 2017-11-30 NOTE — ED Triage Notes (Signed)
Says she has weakness.  Says she has several recent things happen --inner ear, uti and her left elbow hasnt healed yet from a fall.  She is alert and oriented and in nad.  Says she is just weaker.

## 2017-12-01 DIAGNOSIS — R42 Dizziness and giddiness: Secondary | ICD-10-CM | POA: Diagnosis not present

## 2017-12-01 MED ORDER — DIAZEPAM 2 MG PO TABS: 2.0000 mg | ORAL_TABLET | Freq: Three times a day (TID) | ORAL | Status: DC | PRN

## 2017-12-01 MED ORDER — DIAZEPAM 2 MG PO TABS: 2.0000 mg | ORAL_TABLET | Freq: Three times a day (TID) | ORAL | 0 refills | Status: DC | PRN

## 2017-12-01 NOTE — Discharge Instructions (Signed)
Dizziness Dizziness is a common problem. It is a feeling of unsteadiness or light-headedness. You may feel like you are about to faint. Dizziness can lead to injury if you stumble or fall. Anyone can become dizzy, but dizziness is more common in older adults. This condition can be caused by a number of things, including medicines, dehydration, or illness. Follow these instructions at home: Eating and drinking  Drink enough fluid to keep your urine clear or pale yellow. This helps to keep you from becoming dehydrated. Try to drink more clear fluids, such as water.  Do not drink alcohol.  Limit your caffeine intake if told to do so by your health care provider. Check ingredients and nutrition facts to see if a food or beverage contains caffeine.  Limit your salt (sodium) intake if told to do so by your health care provider. Check ingredients and nutrition facts to see if a food or beverage contains sodium. Activity  Avoid making quick movements. ? Rise slowly from chairs and steady yourself until you feel okay. ? In the morning, first sit up on the side of the bed. When you feel okay, stand slowly while you hold onto something until you know that your balance is fine.  If you need to stand in one place for a long time, move your legs often. Tighten and relax the muscles in your legs while you are standing.  Do not drive or use heavy machinery if you feel dizzy.  Avoid bending down if you feel dizzy. Place items in your home so that they are easy for you to reach without leaning over. Lifestyle  Do not use any products that contain nicotine or tobacco, such as cigarettes and e-cigarettes. If you need help quitting, ask your health care provider.  Try to reduce your stress level by using methods such as yoga or meditation. Talk with your health care provider if you need help to manage your stress. General instructions  Watch your dizziness for any changes.  Take over-the-counter and  prescription medicines only as told by your health care provider. Talk with your health care provider if you think that your dizziness is caused by a medicine that you are taking.  Tell a friend or a family member that you are feeling dizzy. If he or she notices any changes in your behavior, have this person call your health care provider.  Keep all follow-up visits as told by your health care provider. This is important. Contact a health care provider if:  Your dizziness does not go away.  Your dizziness or light-headedness gets worse.  You feel nauseous.  You have reduced hearing.  You have new symptoms.  You are unsteady on your feet or you feel like the room is spinning. Get help right away if:  You vomit or have diarrhea and are unable to eat or drink anything.  You have problems talking, walking, swallowing, or using your arms, hands, or legs.  You feel generally weak.  You are not thinking clearly or you have trouble forming sentences. It may take a friend or family member to notice this.  You have chest pain, abdominal pain, shortness of breath, or sweating.  Your vision changes.  You have any bleeding.  You have a severe headache.  You have neck pain or a stiff neck.  You have a fever. These symptoms may represent a serious problem that is an emergency. Do not wait to see if the symptoms will go away. Get medical help  right away. Call your local emergency services (911 in the [place] Do not drive yourself to the hospital. Summary  Dizziness is a feeling of unsteadiness or light-headedness. This condition can be caused by a number of things, including medicines, dehydration, or illness.  Anyone can become dizzy, but dizziness is more common in older adults.  Drink enough fluid to keep your urine clear or pale yellow. Do not drink alcohol.  Avoid making quick movements if you feel dizzy. Monitor your dizziness for any changes. This information is not intended to  replace advice given to you by your health care provider. Make sure you discuss any questions you have with your health care provider. Document Released: 09/27/2000 Document Revised: 05/06/2016 Document Reviewed: 05/06/2016 Elsevier Interactive Patient Education  2018 [employer].

## 2017-12-01 NOTE — Clinical Social Work Note (Signed)
Clinical Social Work Assessment  Patient Details  Name: Claire Washington MRN: [id number] Date of Birth: 1924/10/10  Date of referral:  12/01/17               Reason for consult:  Facility Placement                Permission sought to share information with:  Chartered certified accountant granted to share information::  Yes, Verbal Permission Granted  Name::        Agency::  [facility]  Relationship::     Contact Information:     Housing/Transportation Living arrangements for the past 2 months:  Charity fundraiser of Information:  Patient, Medical Team, Adult Children Patient Interpreter Needed:  None Criminal Activity/Legal Involvement Pertinent to Current Situation/Hospitalization:  No - Comment as needed Significant Relationships:  Adult Children, Community Support Lives with:  Self Do you feel safe going back to the place where you live?  Yes Need for family participation in patient care:  No (Coment)  Care giving concerns:  Patient was concerned that she may need higher level of care   Social Worker assessment / plan:  The CSW met with the patient and her daughter at bedside to discuss discharge planning. The CSW introduced self and role in care. The patient and her daughter shared that the patient is an independent living resident at [facility] as part of the [employer]. The patient stated that she no longer feels that she needs a higher level of care as her daughter will be spending today and tomorrow to assist her in the home. The patient and her daughter shared that the patient would like to continue PT on an outpatient basis which she has been attending regularly. The patient will return to her home with her family transporting. The CSW is signing off. Please consult should the patient have needs.  Employment status:  Retired Forensic scientist:  Medicare PT Recommendations:  Not assessed at this time Information / Referral to  community resources:     Patient/Family's Response to care: The patient and her daughter thanked the CSW.  Patient/Family's Understanding of and Emotional Response to Diagnosis, Current Treatment, and Prognosis:  The patient and her daughter understand and agree with discharge home today with follow up with her PCP and outpatient PT.  Emotional Assessment Appearance:  Appears younger than stated age Attitude/Demeanor/Rapport:  Self-Confident, Gracious, Engaged Affect (typically observed):  Accepting, Appropriate, Pleasant Orientation:  Oriented to Self, Oriented to Place, Oriented to  Time, Oriented to Situation Alcohol / Substance use:  Never Used Psych involvement (Current and /or in the community):  No (Comment)  Discharge Needs  Concerns to be addressed:  No discharge needs identified Readmission within the last 30 days:  No Current discharge risk:  Lives alone Barriers to Discharge:  No Barriers Identified   Zettie Pho, LCSW 12/01/2017, 12:12 PM

## 2017-12-01 NOTE — Progress Notes (Signed)
PT therapy completed with pt reporting vertigo improved; has family that is going to be staying with her for couple of days. Reports she agrees with plans for discharge home to self/family care. Discharge to self/family care to independent living with pt transported in transport chair.

## 2017-12-01 NOTE — Discharge Summary (Signed)
[facility] [place] NAME: Claire Washington    MR#:  [id number]  DATE OF BIRTH:  [birth year]  DATE OF ADMISSION:  11/30/2017   ADMITTING PHYSICIAN: Demetrios Loll, MD  DATE OF DISCHARGE: 12/01/2017  2:14 PM  PRIMARY CARE PHYSICIAN: Tracie Harrier, MD   ADMISSION DIAGNOSIS:  Vertigo [R42] DISCHARGE DIAGNOSIS:  Active Problems:   Vertigo  SECONDARY DIAGNOSIS:   Past Medical History:  Diagnosis Date  . Arthritis   . Basal cell carcinoma    basel cell  . Chronic cystitis   . Diabetes mellitus type 2, controlled, without complications ([place])    type 2 or unspecified type diabetes mellitus without mention of complication, not stated as uncontrolled  . Disease of thyroid gland   . Embolic stroke ([place])    a. 07/2016 MRI Brain: small right superior cerebellum and left lateral occipital cortex infarcts;  b. 08/2016 Echo: EF 60-65%, Gr1 DD, mild MR;  c. 08/2016 30 Day Event Monitor: No significant tachy/bradyarrhythmias. No afib.  . Follicular neoplasm of thyroid   . Hemorrhage of rectum and anus   . Hyperlipidemia   . Hyperlipidemia, unspecified   . Hypertension   . Leg pain   . Osteoarthritis   . Pernicious anemia   . Small vessel disease ([place])   . Spinal stenosis, lumbar region without neurogenic claudication   . Subdural hematoma ([place])    a. 2013 in setting of syncope.  . Syncope and collapse    a. 2013 - syncope and fall w/ resultant subdural hematoma; b. prev nl echo and stress test @ [facility].  Marland Kitchen Thyroid nodule   . Vertigo & Chronic Dizziness    a. Previously seen by ENT with Physical Therapy.  MRI 2000 small vessel disease   HOSPITAL COURSE:   Vertigo. The patient was placed for observation.  She refused to take Antivert since it did not work.  The patient still has dizziness.  Per Dr. Kathyrn Sheriff, give Valium as needed.  Follow up as outpatient.  Hypertension.  Continue home hypertension medication. History of CVA.  Continue  Plavix and Lipitor. Discussed with Dr. Ladene Artist, ENT physician. DISCHARGE CONDITIONS:  Stable, discharge to home today. CONSULTS OBTAINED:   DRUG ALLERGIES:   Allergies  Allergen Reactions  . Hydrocortisone Swelling  . Sulfa Antibiotics Other (See Comments)    Reaction: unknown   DISCHARGE MEDICATIONS:   Allergies as of 12/01/2017      Reactions   Hydrocortisone Swelling   Sulfa Antibiotics Other (See Comments)   Reaction: unknown      Medication List    TAKE these medications   amLODipine 5 MG tablet Commonly known as:  NORVASC Take 1 tablet (5 mg total) by mouth daily.   atorvastatin 10 MG tablet Commonly known as:  LIPITOR Take 1 tablet (10 mg total) by mouth daily. What changed:  when to take this   CENTRUM SILVER PO Take 1 tablet by mouth daily. Reported on 06/11/2015   clopidogrel 75 MG tablet Commonly known as:  PLAVIX TAKE 1 TABLET (75 MG TOTAL) BY MOUTH DAILY.   diazepam 2 MG tablet Commonly known as:  VALIUM Take 1 tablet (2 mg total) by mouth 3 (three) times daily as needed (vertigo).   doxycycline 100 MG capsule Commonly known as:  VIBRAMYCIN Take 100 mg by mouth 2 (two) times daily.   metoprolol tartrate 25 MG tablet Commonly known as:  LOPRESSOR Take 0.5 tablets (12.5 mg total) by mouth  2 (two) times daily.        DISCHARGE INSTRUCTIONS:  See AVS. If you experience worsening of your admission symptoms, develop shortness of breath, life threatening emergency, suicidal or homicidal thoughts you must seek medical attention immediately by calling 911 or calling your MD immediately  if symptoms less severe.  You Must read complete instructions/literature along with all the possible adverse reactions/side effects for all the Medicines you take and that have been prescribed to you. Take any new Medicines after you have completely understood and accpet all the possible adverse reactions/side effects.   Please note  You were cared for by a  hospitalist during your hospital stay. If you have any questions about your discharge medications or the care you received while you were in the hospital after you are discharged, you can call the unit and asked to speak with the hospitalist on call if the hospitalist that took care of you is not available. Once you are discharged, your primary care physician will handle any further medical issues. Please note that NO REFILLS for any discharge medications will be authorized once you are discharged, as it is imperative that you return to your primary care physician (or establish a relationship with a primary care physician if you do not have one) for your aftercare needs so that they can reassess your need for medications and monitor your lab values.    On the day of Discharge:  VITAL SIGNS:  Blood pressure (!) 158/64, pulse 81, temperature 98 F (36.7 C), temperature source Oral, resp. rate 20, height 5' (1.524 m), weight 52.1 kg, SpO2 95 %. PHYSICAL EXAMINATION:  GENERAL:  84 y.o.-year-old patient lying in the bed with no acute distress.  EYES: Pupils equal, round, reactive to light and accommodation. No scleral icterus. Extraocular muscles intact.  HEENT: Head atraumatic, normocephalic. Oropharynx and nasopharynx clear.  NECK:  Supple, no jugular venous distention. No thyroid enlargement, no tenderness.  LUNGS: Normal breath sounds bilaterally, no wheezing, rales,rhonchi or crepitation. No use of accessory muscles of respiration.  CARDIOVASCULAR: S1, S2 normal. No murmurs, rubs, or gallops.  ABDOMEN: Soft, non-tender, non-distended. Bowel sounds present. No organomegaly or mass.  EXTREMITIES: No pedal edema, cyanosis, or clubbing.  NEUROLOGIC: Cranial nerves II through XII are intact. Muscle strength 5/5 in all extremities. Sensation intact. Gait not checked.  PSYCHIATRIC: The patient is alert and oriented x 3.  SKIN: No obvious rash, lesion, or ulcer.  DATA REVIEW:   CBC Recent Labs  Lab  11/30/17 0953  WBC 7.4  HGB 14.9  HCT 42.7  PLT 223    Chemistries  Recent Labs  Lab 11/30/17 0953  NA 139  K 4.0  CL 104  CO2 27  GLUCOSE 133*  BUN 16  CREATININE 0.48  CALCIUM 9.0  MG 1.9     Microbiology Results  No results found for this or any previous visit.  RADIOLOGY:  No results found.   Management plans discussed with the patient, family and they are in agreement.  CODE STATUS: Full Code   TOTAL TIME TAKING CARE OF THIS PATIENT: 28 minutes.    Demetrios Loll M.D on 12/01/2017 at 3:34 PM  Between 7am to 6pm - Pager - [phone]  After 6pm go to [email or web address] - Proofreader  [facility]  Office  [phone]  CC: Primary care physician; Tracie Harrier, MD   Note: This dictation was prepared with Dragon dictation along with smaller phrase technology. Any transcriptional errors that result  from this process are unintentional.

## 2017-12-01 NOTE — Progress Notes (Signed)
Physical Therapy Evaluation Patient Details Name: Claire Washington MRN: [id number] DOB: 03-Sep-1924 Today's Date: 12/01/2017   History of Present Illness  Claire Washington  is a 84 y.o. female with a known history of multiple medical problems. The patient presents the ED with complaints of worsening vertigo. The patient has a history of vertigo, which has been worsening since yesterday after PT.  She is not able to walk secondary to spinning sensation.  She denies any other symptoms.  She was recently treated with antibiotics for UTI.  Urinalysis is normal today. She is admitted under observation for her vertigo. MRI of the brain was negative for acute changes.   Clinical Impression  Pt admitted with above diagnosis. Pt currently with functional limitations due to the deficits listed below (see PT Problem List). Pt arrives to ED with complains of worsening vertigo. She reports that the therapist at her facility told her she has issues with posterior and horizontal canal vertigo. PT examination reveals relatively normal limited neuro examination. Exam is negative for focal weakness/numbness. She does have saccadic smooth pursuits and appears to have midrange horizontal L beating nystagmus with L gaze and R horizontal beating nystagus with R gaze. She does have a prior history of cerebellar CVA. Pt also presents with hypometric saccades horizontally during testing. VOR and VOR thrust both appear negative during bedside examination. Pt taken into the R Dix-Hallpike position and has very vigorous horizontal geotrophic nystagmus of approximately 30s duration and 10/10 vertigo. Roll test performed to the right and left and pt presents with vigourous horizontal geotrophic nystagmus bilaterally. Pt reports 10/10 vertigo on the right and 8/10 vertigo on the left. Nystagmus is more vigorous on the right. Roll test is indicative of possible R horizontal canal canalithiasis BPPV. Pt taken through two rounds of Lempert roll  canalith repositioning treatment (CRT) with roll testing between maneuvers. Repeat roll testing between maneuvers is unchanged from initial testing. However after the second CRT vertigo and nystagmus are barely perceptible. Pt reports nausea so provided an hour break before therapist returns to re-assess. Pt is seen during two different times in order to assess her response to CRT. Upon re-assessment roll testing is completely negative for either nystagmus or vertigo. Pt is modified independent for bed mobility but does requires added time and use of bed rails. She is mildly unsteady but able to self-correct with UE support on walker during transfers. Her safety with transfers improves during the second session after CRT. She is able to ambulate in room with a rolling walker. Initially she is unsteady with use of walker however after CRT she demonstrates improved stability and doesn't require external assistance. She does continue to report dizziness but is much more stable. Pt is safe to discharge home with daughter. Encouraged her to use rollator at all time. Pt appears to have R horizontal canal BPPV and needs to follow-up with vestibular therapy for further canalith repositioning as indicated following discharge. Pt will benefit from PT services to address deficits in strength, balance, and mobility in order to return to full function at home.     Follow Up Recommendations Outpatient PT;Other (comment)(VESTIBULAR THERAPY)    Equipment Recommendations  None recommended by PT(Must use her rollator or walker at all times)    Recommendations for Other Services       Precautions / Restrictions Precautions Precautions: Fall Restrictions Weight Bearing Restrictions: No      Mobility  Bed Mobility Overal bed mobility: Modified Independent  General bed mobility comments: Use of bed rails, added time required  Transfers Overall transfer level: Needs assistance Equipment used:  Rolling walker (2 wheeled) Transfers: Sit to/from Stand Sit to Stand: Min guard         General transfer comment: Pt is mildly unsteady but able to self-correct with UE support on walker. This improves with second bout of walking after canalith repositioning treatment  Ambulation/Gait Ambulation/Gait assistance: Min guard Gait Distance (Feet): 25 Feet Assistive device: Rolling walker (2 wheeled)       General Gait Details: Pt is able to ambulate in room. Initially she is unsteady with use of walker. After canalith repositioning treatment she demonstrates improved stability and doesn't require external assistance. She does continue to report dizziness but is much more stable  Science writer    Modified Rankin (Stroke Patients Only)       Balance Overall balance assessment: Needs assistance Sitting-balance support: No upper extremity supported Sitting balance-Leahy Scale: Good     Standing balance support: Bilateral upper extremity supported Standing balance-Leahy Scale: Fair Standing balance comment: Able to maintain balance with UE support on walker but does report dizziness                             Pertinent Vitals/Pain Pain Assessment: No/denies pain    Home Living Family/patient expects to be discharged to:: Private residence Living Arrangements: Alone Available Help at Discharge: Friend(s);Available PRN/intermittently Type of Home: Apartment Home Access: Level entry     Home Layout: One level Home Equipment: Walker - 4 wheels;Cane - single point;Bedside commode;Wheelchair - manual;Shower seat;Grab bars - toilet;Grab bars - tub/shower      Prior Function Level of Independence: Independent         Comments: Pt reports she is independent with ambulation using her rollator. Endorses 2 falls in the last 12 months. Was driving prior to this admission. Independent with ADLs/IADLs     Hand Dominance         Extremity/Trunk Assessment   Upper Extremity Assessment Upper Extremity Assessment: Overall WFL for tasks assessed    Lower Extremity Assessment Lower Extremity Assessment: Overall WFL for tasks assessed       Communication   Communication: No difficulties  Cognition Arousal/Alertness: Awake/alert Behavior During Therapy: WFL for tasks assessed/performed Overall Cognitive Status: Within Functional Limits for tasks assessed                                        General Comments      Exercises Other Exercises Other Exercises: Pt taken through two rounds of the Lempert roll for R horizontal canal BPPV.    Assessment/Plan    PT Assessment Patient needs continued PT services  PT Problem List Decreased strength;Decreased activity tolerance;Decreased balance       PT Treatment Interventions DME instruction;Gait training;Therapeutic activities;Therapeutic exercise;Balance training;Neuromuscular re-education;Patient/family education;Other (comment)(Canalith Repositioning Treatment)    PT Goals (Current goals can be found in the Care Plan section)  Acute Rehab PT Goals Patient Stated Goal: Return to prior level of function and decrease dizziness PT Goal Formulation: With patient/family Time For Goal Achievement: 12/15/17 Potential to Achieve Goals: Good    Frequency Min 2X/week   Barriers to discharge Decreased caregiver support Lives in a senior community but in independent  living apartment without assistance    Co-evaluation               AM-PAC PT "6 Clicks" Daily Activity  Outcome Measure Difficulty turning over in bed (including adjusting bedclothes, sheets and blankets)?: A Little Difficulty moving from lying on back to sitting on the side of the bed? : A Little Difficulty sitting down on and standing up from a chair with arms (e.g., wheelchair, bedside commode, etc,.)?: A Little Help needed moving to and from a bed to chair (including a  wheelchair)?: A Little Help needed walking in hospital room?: A Little Help needed climbing 3-5 steps with a railing? : A Lot 6 Click Score: 17    End of Session Equipment Utilized During Treatment: Gait belt Activity Tolerance: Patient tolerated treatment well Patient left: in bed;with call bell/phone within reach;with bed alarm set;with family/visitor present Nurse Communication: Mobility status PT Visit Diagnosis: Unsteadiness on feet (R26.81);Muscle weakness (generalized) (M62.81);History of falling (Z91.81)    Time: 1024-1130(Split session: second session 1250 to 1315) PT Time Calculation (min) (ACUTE ONLY): 66 min   Charges:   PT Evaluation $PT Eval High Complexity: 1 High PT Treatments $Therapeutic Activity: 23-37 mins        Jason D Huprich PT, DPT, GCS   Huprich,Jason 12/01/2017, 4:10 PM

## 2017-12-01 NOTE — Clinical Social Work Note (Signed)
CSW received consult for discharge planning. CSW will assess when able.  Santiago Bumpers, MSW, Latanya Presser [phone]

## 2017-12-07 ENCOUNTER — Ambulatory Visit: Payer: Medicare Other | Attending: Internal Medicine

## 2017-12-07 DIAGNOSIS — M6281 Muscle weakness (generalized): Secondary | ICD-10-CM | POA: Insufficient documentation

## 2017-12-07 DIAGNOSIS — R262 Difficulty in walking, not elsewhere classified: Secondary | ICD-10-CM | POA: Diagnosis present

## 2017-12-07 DIAGNOSIS — R42 Dizziness and giddiness: Secondary | ICD-10-CM | POA: Insufficient documentation

## 2017-12-07 NOTE — Therapy (Addendum)
[place] MAIN [facility] SERVICES [address] [place], [place], [address] Phone: [phone]   Fax:  [phone]  Physical Therapy Evaluation  Patient Details  Name: Tomesha Sargent MRN: [id number] Date of Birth: 23-Oct-1924 Referring Provider: Dr. Pryor Ochoa  Encounter Date: 12/07/2017  PT End of Session - 12/10/17 0912    Visit Number  1    Number of Visits  25    Date for PT Re-Evaluation  03/01/18    Authorization Type  Progress Note: 1/10    Authorization Time Period  Last goals: 12/07/17    PT Start Time  1015    PT Stop Time  1120    PT Time Calculation (min)  65 min    Equipment Utilized During Treatment  Gait belt    Activity Tolerance  Patient tolerated treatment well    Behavior During Therapy  [facility] for tasks assessed/performed       Past Medical History:  Diagnosis Date  . Arthritis   . Basal cell carcinoma    basel cell  . Chronic cystitis   . Diabetes mellitus type 2, controlled, without complications ([place])    type 2 or unspecified type diabetes mellitus without mention of complication, not stated as uncontrolled  . Disease of thyroid gland   . Embolic stroke ([place])    a. 07/2016 MRI Brain: small right superior cerebellum and left lateral occipital cortex infarcts;  b. 08/2016 Echo: EF 60-65%, Gr1 DD, mild MR;  c. 08/2016 30 Day Event Monitor: No significant tachy/bradyarrhythmias. No afib.  . Follicular neoplasm of thyroid   . Hemorrhage of rectum and anus   . Hyperlipidemia   . Hyperlipidemia, unspecified   . Hypertension   . Leg pain   . Osteoarthritis   . Pernicious anemia   . Small vessel disease ([place])   . Spinal stenosis, lumbar region without neurogenic claudication   . Subdural hematoma ([place])    a. 2013 in setting of syncope.  . Syncope and collapse    a. 2013 - syncope and fall w/ resultant subdural hematoma; b. prev nl echo and stress test @ [facility].  Marland Kitchen Thyroid nodule   . Vertigo & Chronic Dizziness     a. Previously seen by ENT with Physical Therapy.  MRI 2000 small vessel disease    Past Surgical History:  Procedure Laterality Date  . BILATERAL SALPINGOOPHORECTOMY    . COLONOSCOPY  01/26/2009   Diverticulosis  . CYSTOSCOPY    . HEMORRHOIDECTOMY BY SIMPLE LIGATION    . PR COLONOSCOPY FLX W/ENDOSCOPIC MUCOSAL RESECTION  09/02/2014   Procedure: COLONOSCOPY, FLEXIBLE; WITH ENDOSCOPIC MUCOSAL RESECTION; Surgeon: Saunders Revel, MD; Location: [facility]; Service: Gastroenterology  . PR COLSC FLX W/RMVL OF TUMOR POLYP LESION SNARE TQ  09/02/2014   Procedure: COLONOSCOPY FLEX; W/REMOV TUMOR/LES BY SNARE; Surgeon: Saunders Revel, MD; Location: [facility]; Service: Gastroenterology  . SKIN SURGERY    . TOTAL ABDOMINAL HYSTERECTOMY W/ BILATERAL SALPINGOOPHORECTOMY  1970s    There were no vitals filed for this visit.   Subjective Assessment - 12/10/17 0912    Subjective  Dizziness    Pertinent History   Pt was admitted to [facility] for vertigo and discharged 8/17. She had an MRI during admission to rule out CVA which did not show any acute intracranial abnormality. Pt does have a history of R superior cerebellar and left lateral occipital infarcts from 2018. During admission she was seen by this therapist and  had a positive R Dix-Hallpike and R roll test for very vigorous horizontal geotrophic nystagmus with 10/10 severity of vertigo. L roll test also confirmed positive horizontal canal testing with less severe vertigo and expected geoptrophic horizontal nystagmus.  Pt treated by therapist for possible R horizontal canal canalithiasis BPPV with Lempert roll x 2. Retesting is negative for any further symptoms however balance remains notably impaired. Pt was discharged back to her facility and had a follow-up appointment with Dr. Pryor Ochoa at [facility] ENT. She returned for follow-up with physical therapy with an order for ENT for vestibular therapy. 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 At the time of PT evaluation pt denies any further episodes of vertigo since discharge from [facility]. She does report persistent imbalance and has been using her wheelchair for mobility due to fear of falling. She has not had any falls since discharge. Pt reports any other significant changes in her health. She is known to this clinic having been treated for BPPV in the past.    Limitations  Walking    Diagnostic tests  MRI at [facility] showed no acute changes. Chronic R superior cerebellar and L occipital infarct    Patient Stated Goals  Improve strength, balance, and dizziness    Currently in Pain?  No/denies        VESTIBULAR AND BALANCE EVALUATION  HISTORY:  Subjective history of current problem: Pt was admitted to [facility] for vertigo and discharged 8/17. She had an MRI during admission to rule out CVA which did not show any acute intracranial abnormality. Pt does have a history of R superior cerebellar and left lateral occipital infarcts from 2018. During admission she was seen by this therapist and had a positive R Dix-Hallpike and R roll test for very vigorous horizontal geotrophic nystagmus with 10/10 severity of vertigo. L roll test also confirmed positive horizontal canal testing with less severe vertigo and expected geoptrophic horizontal nystagmus.  Pt treated by therapist for possible R horizontal canal canalithiasis BPPV with Lempert roll x 2. Retesting is negative for any further symptoms however balance remains notably impaired. Pt was discharged back to her facility and had a follow-up appointment with Dr. Pryor Ochoa at [facility]. She returned for follow-up with physical therapy with an order from ENT for vestibular therapy. 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 At the time of PT evaluation pt denies any further episodes of vertigo since  discharge from [facility]. She does report persistent imbalance and has been using her wheelchair for mobility due to fear of falling. She has not had any falls since discharge. Pt reports any other significant changes in her health. She is known to this clinic having been treated for BPPV in the past.  Frequency: Constant imbalance with standing Duration: When standing/walking Symptom nature: (motion provoked, positional, spontaneous, constant, variable, intermittent) When walking  Provocative Factors: Walking, standing Easing Factors: Sitting  Progression of symptoms: (better, worse, no change since onset) Improved since admission but worsened over a period of months/years History of similar episodes: Previously treated at [facility] for BPPV and balance issues  Falls (yes/no): Yes Number of falls in past 6 months: 2  Prior Functional Level: Previously ambulatory with her rollator, driving, and functionally independent   Auditory complaints (tinnitus, pain, drainage): None reported Vision (last eye exam, diplopia, recent changes): None reported  Current Symptoms: (dysarthria, dysphagia,  drop attacks, bowel and bladder changes, recent weight loss/gain)    Review of systems negative for red flags.     EXAMINATION  POSTURE: Forward head and rounded shoulders  NEUROLOGICAL SCREEN: (2+ unless otherwise noted.) N=normal  Ab=abnormal  Level Dermatome R L Myotome R L Reflex R L  C3 Anterior Neck N N Sidebend C2-3 N N Jaw CN V    C4 Top of Shoulder N N Shoulder Shrug C4 N N Hoffman's UMN    C5 Lateral Upper Arm N N Shoulder ABD C4-5 N N Biceps C5-6    C6 Lateral Arm/ Thumb N N Arm Flex/ Wrist Ext C5-6 N N Brachiorad. C5-6    C7 Middle Finger N N Arm Ext//Wrist Flex C6-7 N N Triceps C7    C8 4th & 5th Finger N N Flex/ Ext Carpi Ulnaris C8 N N Patella    T1 Medial Arm N N Interossei T1 N N Gastrocnemius    L2 Medial thigh/groin N N Illiopsoas (L2-3) N N     L3 Lower thigh/med.knee N N Quadriceps  (L3-4) N N Patellar (L3-4)    L4 Medial leg/lat thigh N N Tibialis Ant (L4-5) N N     L5 Lat. leg & dorsal foot N N EHL (L5) N N     S1 post/lat foot/thigh/leg N N Gastrocnemius (S1-2) N N Gastrocnemius (S1)    S2 Post./med. thigh & leg N N Hamstrings (L4-S3) N N Babinski     Deferred reflex testing  SOMATOSENSORY:         Sensation           Intact      Diminished         Absent  Light touch Normal     Any N & T in extremities or weakness: None      COORDINATION: Deferred  MUSCULOSKELETAL SCREEN: Cervical Spine ROM: Significant limitation in cervical extension, flexion, lateral flexion, and rotation. All canal testing performed on inverted mat table.     ROM: Grossly WFL  MMT: General LE weakness noted but no focal deficits appreciated  Functional Mobility: Hand-held assist for transfers between surfaces. Uses rolling walker to stabilize. Pt arrives in a wheelchair  Gait: Scanning of visual environment with gait is: Limited spontaneous head turning noted during ambulation with walker  Balance: Poor, see BERG for results   OCULOMOTOR / VESTIBULAR TESTING:  Oculomotor Exam- Room Light  Normal Abnormal Comments  Ocular Alignment N    Ocular ROM N    Spontaneous Nystagmus N    End-Gaze Nystagmus N    Smooth Pursuit  A Mid-range gaze evoked horizontal nystagmus, L with L gaze, R with R gaze  Saccades  A Abnormal horizontally in both directions  VOR  A Dizziness reported  VOR Cancellation  A Pt reported dizziness  Left Head Thrust  A Corrective saccade required  Right Head Thrust  A Corrective saccade required  Head Shaking Nystagmus     Static Acuity     Dynamic Acuity       Oculomotor Exam- Fixation Suppressed  Normal Abnormal Comments  Ocular Alignment N  Pt appears to have a mild L eye ptosis  Ocular ROM N    Spontaneous Nystagmus N    End-Gaze Nystagmus  A Pure downbeating nystagmus with L gaze  Left Head Thrust     Right Head Thrust     Head Shaking  Nystagmus N      BPPV TESTS:  Symptoms Duration Intensity  Nystagmus  L Dix-Hallpike None     R Dix-Hallpike None   R downbeating, non-fatigable, with some possible direction-chaning L horizontal   L Head Roll None   None  R Head Roll None   None  L Sidelying Test    None  R Sidelying Test        FUNCTIONAL OUTCOME MEASURES:  Results Comments  DHI    ABC Scale    TUG 31.3s Falls risk; in need of intervention  10 meter Walking Speed 21.7 self-selected=0.46 m/s, 19.9s fastest=0.50 m/s; Fall risk, below community ambulation speeds                    [facility] PT Assessment - 12/10/17 0825      Assessment   Medical Diagnosis  Dizziness    Referring Provider  Dr. Pryor Ochoa    Onset Date/Surgical Date  11/09/17   Approximate onset of vertigo   Hand Dominance  Right    Next MD Visit  Not reported    Prior Therapy  Yes, known to clinic      Precautions   Precautions  Fall      Restrictions   Weight Bearing Restrictions  No      Balance Screen   Has the patient fallen in the past 6 months  Yes    How many times?  2    Has the patient had a decrease in activity level because of a fear of falling?   Yes    Is the patient reluctant to leave their home because of a fear of falling?   Yes      [place] residence    Living Arrangements  Alone    Available Help at Discharge  Family    Type of [place] Access  Level entry    [place]  One level    Additional Comments  Pt lives at [place]   Overall Cognitive Status  Within Functional Limits for tasks assessed      Standardized Balance Assessment   Standardized Balance Assessment  Berg Balance Test;Five Times Sit to Stand;10 meter walk test;Timed Up and Go Test    Five times sit to stand comments   Pt unable to perform sit to stand without using her hands    10 Meter Walk  21.7  self-selected=0.46 m/s, 19.9s fastest=0.50 m/s;      Berg Balance Test   Sit to Stand  Able to stand  independently using hands    Standing Unsupported  Able to stand 2 minutes with supervision    Sitting with Back Unsupported but Feet Supported on Floor or Stool  Able to sit safely and securely 2 minutes    Stand to Sit  Controls descent by using hands    Transfers  Able to transfer safely, definite need of hands    Standing Unsupported with Eyes Closed  Needs help to keep from falling    Standing Ubsupported with Feet Together  Needs help to attain position and unable to hold for 15 seconds    From Standing, Reach Forward with Outstretched Arm  Can reach forward >12 cm safely (5")    From Standing Position, Pick up Object from Floor  Unable to try/needs assist to keep balance    From Standing Position, Turn  to Look Behind Over each Shoulder  Needs assist to keep from losing balance and falling    Turn 360 Degrees  Needs close supervision or verbal cueing    Standing Unsupported, Alternately Place Feet on Step/Stool  Needs assistance to keep from falling or unable to try    Standing Unsupported, One Foot in [place] balance while stepping or standing    Standing on One Leg  Unable to try or needs assist to prevent fall    Total Score  20    Berg comment:  01/20/16: 38/56      Timed Up and Go Test   TUG  Normal TUG    Normal TUG (seconds)  31.3                Objective measurements completed on examination: See above findings.      TREATMENT  Neuromuscular Re-education  Pt performed one bout of VOR x 1 horizontal x 60 seconds and one bout of VOR x 1 vertical x 60 seconds in sitting on plain background. Pt provided verbal and tactile cues for proper technique, especially to smooth out turns and correct speed. Pt provided written handout with extensive education about how to perform exercises at home.                   PT Short Term Goals - 12/10/17 0916       PT SHORT TERM GOAL #1   Title  Pt will be independent with HEP in order to improve strength and balance in order to decrease fall risk and improve function at home and work.     Time  6    Period  Weeks    Status  New    Target Date  01/18/18      PT SHORT TERM GOAL #2   Title  Pt will report no further episodes of vertigo in order to ensure safety when transitioning in and out of bed and when bending over while performing household responsibilities.    Baseline  12/07/17: No vertigo since discharge from [facility] 12/01/17    Time  6    Period  Weeks    Status  New    Target Date  01/18/18        PT Long Term Goals - 12/10/17 0917      PT LONG TERM GOAL #1   Title  Pt will increase self-selected 10MWT by at least 0.13 m/s in order to demonstrate clinically significant improvement in community ambulation.      Baseline  12/07/17 21.7 self-selected=0.46 m/s, 19.9s fastest=0.50 m/s;    Time  12    Period  Weeks    Status  New    Target Date  03/01/18      PT LONG TERM GOAL #2   Title  Pt will improve BERG by at least 3 points in order to demonstrate clinically significant improvement in balance.      Baseline  12/07/17: 20/56    Time  12    Period  Weeks    Status  New    Target Date  03/01/18      PT LONG TERM GOAL #3   Title  Pt will decrease TUG by at least 23% (7.2s/at or below 24.1s) in order to demonstrate decreased improved function and decreased fall risk     Baseline  12/07/17: 31.3s    Time  12    Period  Weeks    Status  New    Target Date  03/01/18      PT LONG TERM GOAL #4   Title  Pt will be able to perform a sit to stand from a regular height chair without UE support in order to improve functional independence at home with transfers.     Baseline  12/07/17: unable to perform sit to stand without heavy UE support    Time  12    Period  Weeks    Status  New    Target Date  03/01/18             Plan - 12/10/17 0913    Clinical Impression Statement  Pt  is a pleasant 84 yo female referred for vertigo and imbalance for vestibular therapy. Pt recently admitted to [facility] for vertigo and discharged 8/17. During admission she was seen by this therapist and had a positive R Dix-Hallpike and R roll test for vigorous horizontal geotrophic nystagmus with 10/10 severity vertigo. L roll test also confirmed positive horizontal canal testing with less severe vertigo and expected geoptrophic horizontal nystagmus.  Pt treated at that time by therapist for possible R horizontal canal canalithiasis BPPV with Lempert roll x 2. Retesting during admission was negative for any further symptoms or nystagmus however patient's balance remained notably impaired. Pt had a post-discharge follow-up with Dr. Pryor Ochoa at [facility] ENT. She returned for follow-up with physical therapy with an order from ENT for vestibular therapy. 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 PT evaluation today reveals mixed peripheral and central signs. Abnormal smooth pursuit, saccade testing, slow VOR, VOR cancellation, and VOR thrust bilaterally. Pt has a persistent pure downbeating nystagmus with L gaze. Posterior and horizontal canal testing on inverted mat table is negative on this date for BPPV as recorded using infrared goggles. However in R Dix-Hallpike position pt does have R downbeating, non-fatigable nystagmus which appears to have a few direction-changing horizontal beats to the left. Pt is asymptomatic in all testing positions. Her balance is notably impaired with BERG of 20/56. Self-selected ten meter gait speed is 0.46 m/s which is below normative values for safe limited community ambulation. TUG of 31.3 places her at a high risk for recurrent falls. She is unable to perform a sit to stand without heavy UE assistance. Will continue to test canals intermittently for BPPV throughout episode of care especially if pt begins to complain of  vertigo. Otherwise will continue adaptation and habituation vestibular training as well as balance and strength intervention. Pt will benefit from PT services to address deficits in strength, balance, and mobility in order to return to full function at home and decrease her risk for future falls.     History and Personal Factors relevant to plan of care:  3 or more personal factors/comorbidities, 4 or more body systems/activity limitations/participation restrictions     Clinical Presentation  Unstable    Clinical Presentation due to:  Highly variable symptoms    Clinical Decision Making  High    Rehab Potential  Fair    PT Frequency  2x / week    PT Duration  12 weeks    PT Treatment/Interventions  ADLs/Self Care Home Management;Aquatic Therapy;Canalith Repostioning;Cryotherapy;Electrical Stimulation;Iontophoresis 4mg /ml Dexamethasone;Moist Heat;Ultrasound;DME Instruction;Gait training;Stair training;Functional mobility training;Therapeutic activities;Therapeutic exercise;Balance training;Neuromuscular re-education;Cognitive remediation;Patient/family education;Manual techniques;Passive range of motion;Dry needling;Vestibular    PT Next Visit Plan  Have pt complete ABC and DHI, repeat canal testing and treat if needed,  instruct pt in Brandt-[place] (at the request of patient's dtr), progress LE strengthening and balance training. Issue balance HEP once pt is determined to be safe    PT Home Exercise Plan  VOR x 1 horizontal and x 1 vertical (Pt was previously performing)    Consulted and Agree with Plan of Care  Patient       Patient will benefit from skilled therapeutic intervention in order to improve the following deficits and impairments:  Abnormal gait, Decreased balance, Difficulty walking, Dizziness, Decreased strength  Visit Diagnosis: Dizziness and giddiness  Difficulty in walking, not elsewhere classified     Problem List Patient Active Problem List   Diagnosis Date Noted  .  Vertigo 11/30/2017  . Gait instability 10/19/2016  . Dizziness 11/03/2015  . Hypertension 12/04/2011  . Hyperlipidemia 12/04/2011  . Syncope 12/04/2011  . Diabetes mellitus ([place]) 12/04/2011  . Smoking hx 12/04/2011   Phillips Grout PT, DPT, GCS  Huprich,Jason 12/10/2017, 9:35 AM  [place] MAIN [facility] SERVICES [address] [place], [place], [address] Phone: [phone]   Fax:  [phone]  Name: Malayah Demuro MRN: [id number] Date of Birth: [birth year]

## 2017-12-10 NOTE — Addendum Note (Signed)
Addended by: Roxana Hires D on: 12/10/2017 09:39 AM   Modules accepted: Orders

## 2017-12-11 ENCOUNTER — Ambulatory Visit: Payer: Medicare Other

## 2017-12-11 VITALS — BP 148/59 | HR 77

## 2017-12-11 DIAGNOSIS — M6281 Muscle weakness (generalized): Secondary | ICD-10-CM

## 2017-12-11 DIAGNOSIS — R42 Dizziness and giddiness: Secondary | ICD-10-CM

## 2017-12-11 DIAGNOSIS — R262 Difficulty in walking, not elsewhere classified: Secondary | ICD-10-CM

## 2017-12-11 NOTE — Therapy (Signed)
[place] MAIN [facility] SERVICES [address] [place], [place], [address] Phone: [phone]   Fax:  [phone]  Physical Therapy Treatment  Patient Details  Name: Claire Washington MRN: [id number] Date of Birth: 09-Jun-1924 Referring Provider: Dr. Pryor Ochoa   Encounter Date: 12/11/2017  PT End of Session - 12/11/17 0928    Visit Number  2    Number of Visits  25    Date for PT Re-Evaluation  03/01/18    Authorization Type  Progress Note: 2/10    Authorization Time Period  Last goals: 12/07/17    PT Start Time  0930    PT Stop Time  1015    PT Time Calculation (min)  45 min    Equipment Utilized During Treatment  Gait belt    Activity Tolerance  Patient tolerated treatment well    Behavior During Therapy  [place] for tasks assessed/performed       Past Medical History:  Diagnosis Date  . Arthritis   . Basal cell carcinoma    basel cell  . Chronic cystitis   . Diabetes mellitus type 2, controlled, without complications ([place])    type 2 or unspecified type diabetes mellitus without mention of complication, not stated as uncontrolled  . Disease of thyroid gland   . Embolic stroke ([place])    a. 07/2016 MRI Brain: small right superior cerebellum and left lateral occipital cortex infarcts;  b. 08/2016 Echo: EF 60-65%, Gr1 DD, mild MR;  c. 08/2016 30 Day Event Monitor: No significant tachy/bradyarrhythmias. No afib.  . Follicular neoplasm of thyroid   . Hemorrhage of rectum and anus   . Hyperlipidemia   . Hyperlipidemia, unspecified   . Hypertension   . Leg pain   . Osteoarthritis   . Pernicious anemia   . Small vessel disease ([place])   . Spinal stenosis, lumbar region without neurogenic claudication   . Subdural hematoma ([place])    a. 2013 in setting of syncope.  . Syncope and collapse    a. 2013 - syncope and fall w/ resultant subdural hematoma; b. prev nl echo and stress test @ [facility].  Marland Kitchen Thyroid nodule   . Vertigo & Chronic Dizziness     a. Previously seen by ENT with Physical Therapy.  MRI 2000 small vessel disease    Past Surgical History:  Procedure Laterality Date  . BILATERAL SALPINGOOPHORECTOMY    . COLONOSCOPY  01/26/2009   Diverticulosis  . CYSTOSCOPY    . HEMORRHOIDECTOMY BY SIMPLE LIGATION    . PR COLONOSCOPY FLX W/ENDOSCOPIC MUCOSAL RESECTION  09/02/2014   Procedure: COLONOSCOPY, FLEXIBLE; WITH ENDOSCOPIC MUCOSAL RESECTION; Surgeon: Saunders Revel, MD; Location: [facility]; Service: Gastroenterology  . PR COLSC FLX W/RMVL OF TUMOR POLYP LESION SNARE TQ  09/02/2014   Procedure: COLONOSCOPY FLEX; W/REMOV TUMOR/LES BY SNARE; Surgeon: Saunders Revel, MD; Location: [facility]; Service: Gastroenterology  . SKIN SURGERY    . TOTAL ABDOMINAL HYSTERECTOMY W/ BILATERAL SALPINGOOPHORECTOMY  1970s    Vitals:   12/11/17 0933  BP: (!) 148/59  Pulse: 77  SpO2: 96%    Subjective Assessment - 12/11/17 0926    Subjective  Pt reports that she used her walker at home intermittently and feels like she is making progress. She woke up feeling well but then started to have a little dizziness which has persisted. Denies any true vertigo. Denies pain. No specific question or concerns at this time.     Pertinent  History   Pt was admitted to [facility] for vertigo and discharged 8/17. She had an MRI during admission to rule out CVA which did not show any acute intracranial abnormality. Pt does have a history of R superior cerebellar and left lateral occipital infarcts from 2018. During admission she was seen by this therapist and had a positive R Dix-Hallpike and R roll test for very vigorous horizontal geotrophic nystagmus with 10/10 severity of vertigo. L roll test also confirmed positive horizontal canal testing with less severe vertigo and expected geoptrophic horizontal nystagmus.  Pt treated by therapist for possible R horizontal canal canalithiasis BPPV with Lempert roll x 2. Retesting is negative  for any further symptoms however balance remains notably impaired. Pt was discharged back to her facility and had a follow-up appointment with Dr. Pryor Ochoa at [facility]. She returned for follow-up with physical therapy with an order for ENT for vestibular therapy. 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 At the time of PT evaluation pt denies any further episodes of vertigo since discharge from [facility]. She does report persistent imbalance and has been using her wheelchair for mobility due to fear of falling. She has not had any falls since discharge. Pt reports any other significant changes in her health. She is known to this clinic having been treated for BPPV in the past.    Limitations  Walking    Diagnostic tests  MRI at [facility] showed no acute changes. Chronic R superior cerebellar and L occipital infarct    Patient Stated Goals  Improve strength, balance, and dizziness    Currently in Pain?  No/denies         [facility] PT Assessment - 12/11/17 1158      Observation/Other Assessments   Other Surveys   Other Surveys    Activities of Balance Confidence Scale (ABC Scale)   31.25%    Dizziness Handicap Inventory (DHI)   52/100        TREATMENT  Ther-ex  NuStep L1 x 2 minutes, L2 x 2 minutes for warm-up during history (3 minutes unbilled); Sit to stand from elevated mat table (22in) 2 x 10 (added to HEP);  Canalith Repositioning Treatment Dix-Hallpike performed with patient. Negative for symptoms on the R side but pt continues to have a persistent downbeating nystagmus, possible mild R torsional component intermittently. L Dix-Hallpike is positive for vertigo 5/10 intensity and moderately vigorous upbeating L torsional nystagmus of approximately 8s duration. Pt taken through one round of CRT with 2 minute holds in each position. Retested pt with Dix-Hallpike and pt reports 2/10 vertigo and she has a pure downbeating nystagmus of  approximately 15s duration which eventually fatigues. Pt taken through one more bout of CRT clearly positive L Dix-Hallpike during the first testing.   Neuromuscular Re-education  Pt instructed in Brandt-Daroff exercise at the request of her daughter during her last admission; Pt taken through half of a repetition of this exercise however pt states she feels unsafe performing this at home. PT agrees that this would also likely aggravate her back and it is challenging for patient given the strength it requires to perform; Gait in gym and and hallway with a rolling walker while pt performs horizontal and vertical head turns at the command of therapist x 200'; Pt completed ABC and [place] after completion of session (unbilled);  PT Education - 12/11/17 0928    Education Details  exercise form/technique, HEP progression    Person(s) Educated  Patient    Methods  Explanation    Comprehension  Verbalized understanding       PT Short Term Goals - 12/10/17 0916      PT SHORT TERM GOAL #1   Title  Pt will be independent with HEP in order to improve strength and balance in order to decrease fall risk and improve function at home and work.     Time  6    Period  Weeks    Status  New    Target Date  01/18/18      PT SHORT TERM GOAL #2   Title  Pt will report no further episodes of vertigo in order to ensure safety when transitioning in and out of bed and when bending over while performing household responsibilities.    Baseline  12/07/17: No vertigo since discharge from [facility] 12/01/17    Time  6    Period  Weeks    Status  New    Target Date  01/18/18        PT Long Term Goals - 12/10/17 0917      PT LONG TERM GOAL #1   Title  Pt will increase self-selected 10MWT by at least 0.13 m/s in order to demonstrate clinically significant improvement in community ambulation.      Baseline  12/07/17 21.7 self-selected=0.46 m/s, 19.9s fastest=0.50 m/s;    Time  12     Period  Weeks    Status  New    Target Date  03/01/18      PT LONG TERM GOAL #2   Title  Pt will improve BERG by at least 3 points in order to demonstrate clinically significant improvement in balance.      Baseline  12/07/17: 20/56    Time  12    Period  Weeks    Status  New    Target Date  03/01/18      PT LONG TERM GOAL #3   Title  Pt will decrease TUG by at least 23% (7.2s/at or below 24.1s) in order to demonstrate decreased improved function and decreased fall risk     Baseline  12/07/17: 31.3s    Time  12    Period  Weeks    Status  New    Target Date  03/01/18      PT LONG TERM GOAL #4   Title  Pt will be able to perform a sit to stand from a regular height chair without UE support in order to improve functional independence at home with transfers.     Baseline  12/07/17: unable to perform sit to stand without heavy UE support    Time  12    Period  Weeks    Status  New    Target Date  03/01/18            Plan - 12/11/17 9833    Clinical Impression Statement  Pt presented today with positive Dix-Hallpike test for L posterior canal BPPV. She also presents with other variants of nystagmus more common with central disorders however L posterior canal testing was clearly positive. Pt taken through two rounds of CRT with resolution of appropriate nystagmus and decrease in vertigo with testing between maneuvers. Pt demonstrates progression of ambulation distance with therapist while performing horizontal head turns. Attempted to instruct pt is Brandt-Daroff exercise as requested by  daughter however it is too challenging for pt and she feels unsafe. Will readdress in the future if appropriate. Pt will continue to benefit from skilled PT services to address deficits in strength, balance, and dizziness in order to improve function at home.     Rehab Potential  Fair    PT Frequency  2x / week    PT Duration  12 weeks    PT Treatment/Interventions  ADLs/Self Care Home  Management;Aquatic Therapy;Canalith Repostioning;Cryotherapy;Electrical Stimulation;Iontophoresis 4mg /ml Dexamethasone;Moist Heat;Ultrasound;DME Instruction;Gait training;Stair training;Functional mobility training;Therapeutic activities;Therapeutic exercise;Balance training;Neuromuscular re-education;Cognitive remediation;Patient/family education;Manual techniques;Passive range of motion;Dry needling;Vestibular    PT Next Visit Plan  Recheck Dix-Hallpike and treat as needed, progress strength, balance, and gait;    PT Home Exercise Plan  VOR x 1 horizontal and x 1 vertical (Pt was previously performing), sit to stand without UE support (elevated surface as needed, rollator in front for safety)    Consulted and Agree with Plan of Care  Patient       Patient will benefit from skilled therapeutic intervention in order to improve the following deficits and impairments:  Abnormal gait, Decreased balance, Difficulty walking, Dizziness, Decreased strength  Visit Diagnosis: Dizziness and giddiness  Difficulty in walking, not elsewhere classified  Muscle weakness (generalized)     Problem List Patient Active Problem List   Diagnosis Date Noted  . Vertigo 11/30/2017  . Gait instability 10/19/2016  . Dizziness 11/03/2015  . Hypertension 12/04/2011  . Hyperlipidemia 12/04/2011  . Syncope 12/04/2011  . Diabetes mellitus ([place]) 12/04/2011  . Smoking hx 12/04/2011   Phillips Grout PT, DPT, GCS  Tadan Shill 12/11/2017, 11:59 AM  [place] MAIN [facility] SERVICES [place], [place], [address] Phone: [phone]   Fax:  [phone]  Name: Claire Washington MRN: [id number] Date of Birth: Aug 22, 1924

## 2017-12-11 NOTE — Patient Instructions (Signed)
Access Code: [facility]  URL: [email or web address]  Date: 12/11/2017  Prepared by: Roxana Hires   Exercises  Sit to Stand without Arm Support - 10 reps - 1 sets - 2x daily - 7x weekly

## 2017-12-13 ENCOUNTER — Telehealth: Payer: Self-pay | Admitting: Cardiovascular Disease

## 2017-12-13 NOTE — Telephone Encounter (Signed)
Pt c/o BP issue: STAT if pt c/o blurred vision, one-sided weakness or slurred speech  1. What are your last 5 BP readings? None reported   2. Are you having any other symptoms (ex. Dizziness, headache, blurred vision, passed out)? Head pressure dizziness off balance   3. What is your BP issue? sporadic bp highs then lows

## 2017-12-13 NOTE — Telephone Encounter (Signed)
Spoke with patient and she states that she has had a increase in dizziness and weakness. She reports calling EMS twice due to the severe nature of her symptoms. She did see her PCP and he made some changes which made it worse and then recommended that she check with [place] for appointment with provider. Reviewed history of MRI findings, history of dizziness, and at that point she said she had severe vertigo with "crystals on the ear". Discussed that these cause dizziness but she insisted that she needs to see Dr. Rockey Situ because she said it is much worse now. Advised that I will have scheduling call to set up appointment. She verbalized understanding with no further questions.

## 2017-12-14 NOTE — Telephone Encounter (Signed)
Pt scheduled for Sept 30

## 2017-12-14 NOTE — Telephone Encounter (Signed)
Routing to Dr Rockey Situ.  Patient is scheduled for 01/14/18, is this sufficient given history?

## 2017-12-16 NOTE — Telephone Encounter (Signed)
This has been a chronic issue and may be best served with neurology appointment Unclear if I will be able to help

## 2017-12-18 ENCOUNTER — Ambulatory Visit: Payer: Medicare Other | Admitting: Physical Therapy

## 2017-12-18 NOTE — Telephone Encounter (Signed)
Patient verbalized understanding of Dr Donivan Scull recommendations. She already has a neurology appointment set up for next week. No further concerns at this time.

## 2017-12-21 ENCOUNTER — Ambulatory Visit: Payer: Medicare Other | Attending: Otolaryngology

## 2017-12-21 VITALS — BP 148/50 | HR 68

## 2017-12-21 DIAGNOSIS — R262 Difficulty in walking, not elsewhere classified: Secondary | ICD-10-CM | POA: Insufficient documentation

## 2017-12-21 DIAGNOSIS — R42 Dizziness and giddiness: Secondary | ICD-10-CM | POA: Insufficient documentation

## 2017-12-21 DIAGNOSIS — M6281 Muscle weakness (generalized): Secondary | ICD-10-CM | POA: Diagnosis present

## 2017-12-21 NOTE — Therapy (Signed)
[place] MAIN [facility] SERVICES [address] [place], [place], [address] Phone: [phone]   Fax:  [phone]  Physical Therapy Treatment  Patient Details  Name: Claire Washington MRN: [id number] Date of Birth: 1924-04-29 Referring Provider: Dr. Pryor Ochoa   Encounter Date: 12/21/2017  PT End of Session - 12/21/17 1121    Visit Number  3    Number of Visits  25    Date for PT Re-Evaluation  03/01/18    Authorization Type  Progress Note: 3/10    Authorization Time Period  Last goals: 12/07/17    PT Start Time  1117    PT Stop Time  1200    PT Time Calculation (min)  43 min    Equipment Utilized During Treatment  Gait belt    Activity Tolerance  Patient tolerated treatment well    Behavior During Therapy  [facility] for tasks assessed/performed       Past Medical History:  Diagnosis Date  . Arthritis   . Basal cell carcinoma    basel cell  . Chronic cystitis   . Diabetes mellitus type 2, controlled, without complications ([place])    type 2 or unspecified type diabetes mellitus without mention of complication, not stated as uncontrolled  . Disease of thyroid gland   . Embolic stroke ([place])    a. 07/2016 MRI Brain: small right superior cerebellum and left lateral occipital cortex infarcts;  b. 08/2016 Echo: EF 60-65%, Gr1 DD, mild MR;  c. 08/2016 30 Day Event Monitor: No significant tachy/bradyarrhythmias. No afib.  . Follicular neoplasm of thyroid   . Hemorrhage of rectum and anus   . Hyperlipidemia   . Hyperlipidemia, unspecified   . Hypertension   . Leg pain   . Osteoarthritis   . Pernicious anemia   . Small vessel disease ([place])   . Spinal stenosis, lumbar region without neurogenic claudication   . Subdural hematoma ([place])    a. 2013 in setting of syncope.  . Syncope and collapse    a. 2013 - syncope and fall w/ resultant subdural hematoma; b. prev nl echo and stress test @ [facility]  Marland Kitchen Thyroid nodule   . Vertigo & Chronic Dizziness    a. Previously seen by ENT with Physical Therapy.  MRI 2000 small vessel disease    Past Surgical History:  Procedure Laterality Date  . BILATERAL SALPINGOOPHORECTOMY    . COLONOSCOPY  01/26/2009   Diverticulosis  . CYSTOSCOPY    . HEMORRHOIDECTOMY BY SIMPLE LIGATION    . PR COLONOSCOPY FLX W/ENDOSCOPIC MUCOSAL RESECTION  09/02/2014   Procedure: COLONOSCOPY, FLEXIBLE; WITH ENDOSCOPIC MUCOSAL RESECTION; Surgeon: Saunders Revel, MD; Location: [facility]; Service: Gastroenterology  . PR COLSC FLX W/RMVL OF TUMOR POLYP LESION SNARE TQ  09/02/2014   Procedure: COLONOSCOPY FLEX; W/REMOV TUMOR/LES BY SNARE; Surgeon: Saunders Revel, MD; Location: [facility]; Service: Gastroenterology  . SKIN SURGERY    . TOTAL ABDOMINAL HYSTERECTOMY W/ BILATERAL SALPINGOOPHORECTOMY  1970s    Vitals:   12/21/17 1120 12/21/17 1123  BP: (!) 177/50 (!) 148/50  Pulse: 67 68  SpO2: 97% 98%    Subjective Assessment - 12/21/17 1120    Subjective  Pt was too dizzy on Tuesday to be able to drive to therapy. Yesterday she reports she "felt like my old self." No dizziness yesterday but it has returned today at 4/10 currently.     Pertinent History   Pt was admitted to [facility] for  vertigo and discharged 8/17. She had an MRI during admission to rule out CVA which did not show any acute intracranial abnormality. Pt does have a history of R superior cerebellar and left lateral occipital infarcts from 2018. During admission she was seen by this therapist and had a positive R Dix-Hallpike and R roll test for very vigorous horizontal geotrophic nystagmus with 10/10 severity of vertigo. L roll test also confirmed positive horizontal canal testing with less severe vertigo and expected geoptrophic horizontal nystagmus.  Pt treated by therapist for possible R horizontal canal canalithiasis BPPV with Lempert roll x 2. Retesting is negative for any further symptoms however balance remains notably impaired.  Pt was discharged back to her facility and had a follow-up appointment with Dr. Pryor Ochoa at [facility]. She returned for follow-up with physical therapy with an order for ENT for vestibular therapy. 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 At the time of PT evaluation pt denies any further episodes of vertigo since discharge from [facility]. She does report persistent imbalance and has been using her wheelchair for mobility due to fear of falling. She has not had any falls since discharge. Pt reports any other significant changes in her health. She is known to this clinic having been treated for BPPV in the past.    Limitations  Walking    Diagnostic tests  MRI at [facility] showed no acute changes. Chronic R superior cerebellar and L occipital infarct    Patient Stated Goals  Improve strength, balance, and dizziness    Currently in Pain?  No/denies          TREATMENT  Canalith Repositioning Treatment Dix-Hallpike performed with patient. Positive for symptoms on the R side of 4/10 intensity with 15-20 beats of R geotrophic horizontal nystagmus, questionable R torsional component. L Dix-Hallpike is positive for vertigo 4/10 intensity and moderately vigorous L pure horizontal geotrophic nystagmus. Tested horizontal canal with roll test and pt is consistently more symptomatic with L sided testing and nystagmus is more vigorous. Pt taken through one round of Lempert roll for L horizontal canal BPPV with 1 minute holds in each position. Retested pt with roll test after first treatment and on the left pt reports 3/10 vertigo with L horizontal geotrophic nystagmus. Pt taken through one more bout of Lempert roll with 2 minute holds. After second Lempert roll horizontal canal retested and is completely negative bilaterally for nystagmus or vertigo.                     PT Education - 12/21/17 1121    Education Details  exercise  form/technique    Person(s) Educated  Patient    Methods  Explanation    Comprehension  Verbalized understanding       PT Short Term Goals - 12/10/17 0916      PT SHORT TERM GOAL #1   Title  Pt will be independent with HEP in order to improve strength and balance in order to decrease fall risk and improve function at home and work.     Time  6    Period  Weeks    Status  New    Target Date  01/18/18      PT SHORT TERM GOAL #2   Title  Pt will report no further episodes of vertigo in order to ensure safety when transitioning in and out of bed and when bending over while performing  household responsibilities.    Baseline  12/07/17: No vertigo since discharge from [facility] 12/01/17    Time  6    Period  Weeks    Status  New    Target Date  01/18/18        PT Long Term Goals - 12/10/17 0917      PT LONG TERM GOAL #1   Title  Pt will increase self-selected 10MWT by at least 0.13 m/s in order to demonstrate clinically significant improvement in community ambulation.      Baseline  12/07/17 21.7 self-selected=0.46 m/s, 19.9s fastest=0.50 m/s;    Time  12    Period  Weeks    Status  New    Target Date  03/01/18      PT LONG TERM GOAL #2   Title  Pt will improve BERG by at least 3 points in order to demonstrate clinically significant improvement in balance.      Baseline  12/07/17: 20/56    Time  12    Period  Weeks    Status  New    Target Date  03/01/18      PT LONG TERM GOAL #3   Title  Pt will decrease TUG by at least 23% (7.2s/at or below 24.1s) in order to demonstrate decreased improved function and decreased fall risk     Baseline  12/07/17: 31.3s    Time  12    Period  Weeks    Status  New    Target Date  03/01/18      PT LONG TERM GOAL #4   Title  Pt will be able to perform a sit to stand from a regular height chair without UE support in order to improve functional independence at home with transfers.     Baseline  12/07/17: unable to perform sit to stand without heavy UE  support    Time  12    Period  Weeks    Status  New    Target Date  03/01/18            Plan - 12/21/17 1121    Clinical Impression Statement  Pt presented today with positive roll test test for L horizontal canal BPPV. Pt taken through two rounds of Lempert with resolution of nystagmus and symptoms following second maneuver. Given the time required for canalith repositioning therapist is unable to include any balance or strength training today. Will incorporate into future sessions as able. Pt will continue to benefit from skilled PT services to address deficits in strength, balance, and dizziness in order to improve function at home.     Rehab Potential  Fair    PT Frequency  2x / week    PT Duration  12 weeks    PT Treatment/Interventions  ADLs/Self Care Home Management;Aquatic Therapy;Canalith Repostioning;Cryotherapy;Electrical Stimulation;Iontophoresis 4mg /ml Dexamethasone;Moist Heat;Ultrasound;DME Instruction;Gait training;Stair training;Functional mobility training;Therapeutic activities;Therapeutic exercise;Balance training;Neuromuscular re-education;Cognitive remediation;Patient/family education;Manual techniques;Passive range of motion;Dry needling;Vestibular    PT Next Visit Plan  Recheck Dix-Hallpike and roll test and treat as needed, progress strength, balance, and gait;    PT Home Exercise Plan  VOR x 1 horizontal and x 1 vertical (Pt was previously performing), sit to stand without UE support (elevated surface as needed, rollator in front for safety)    Consulted and Agree with Plan of Care  Patient       Patient will benefit from skilled therapeutic intervention in order to improve the following deficits and impairments:  Abnormal gait, Decreased  balance, Difficulty walking, Dizziness, Decreased strength  Visit Diagnosis: Dizziness and giddiness  Difficulty in walking, not elsewhere classified  Muscle weakness (generalized)     Problem List Patient Active  Problem List   Diagnosis Date Noted  . Vertigo 11/30/2017  . Gait instability 10/19/2016  . Dizziness 11/03/2015  . Hypertension 12/04/2011  . Hyperlipidemia 12/04/2011  . Syncope 12/04/2011  . Diabetes mellitus ([place]) 12/04/2011  . Smoking hx 12/04/2011   Phillips Grout PT, DPT, GCS  Jariel Drost 12/22/2017, 3:39 PM  [place] MAIN [facility] SERVICES [address] [place], [place], [address] Phone: [phone]   Fax:  [phone]  Name: Claire [place] MRN: [id number] Date of Birth: 09-30-1924

## 2017-12-25 ENCOUNTER — Ambulatory Visit: Payer: Medicare Other

## 2017-12-25 DIAGNOSIS — R42 Dizziness and giddiness: Secondary | ICD-10-CM

## 2017-12-25 DIAGNOSIS — R262 Difficulty in walking, not elsewhere classified: Secondary | ICD-10-CM

## 2017-12-25 DIAGNOSIS — M6281 Muscle weakness (generalized): Secondary | ICD-10-CM

## 2017-12-25 NOTE — Therapy (Signed)
[place] MAIN [facility] SERVICES [address] [place], [place], [address] Phone: [phone]   Fax:  [phone]  Physical Therapy Treatment  Patient Details  Name: Claire Washington MRN: [id number] Date of Birth: 01/10/1925 Referring Provider: Dr. Pryor Ochoa   Encounter Date: 12/25/2017  PT End of Session - 12/25/17 1028    Visit Number  4    Number of Visits  25    Date for PT Re-Evaluation  03/01/18    Authorization Type  Progress Note: 4/10    Authorization Time Period  Last goals: 12/07/17    PT Start Time  1030    PT Stop Time  1115    PT Time Calculation (min)  45 min    Equipment Utilized During Treatment  Gait belt    Activity Tolerance  Patient tolerated treatment well    Behavior During Therapy  [facility] for tasks assessed/performed       Past Medical History:  Diagnosis Date  . Arthritis   . Basal cell carcinoma    basel cell  . Chronic cystitis   . Diabetes mellitus type 2, controlled, without complications ([place])    type 2 or unspecified type diabetes mellitus without mention of complication, not stated as uncontrolled  . Disease of thyroid gland   . Embolic stroke ([place])    a. 07/2016 MRI Brain: small right superior cerebellum and left lateral occipital cortex infarcts;  b. 08/2016 Echo: EF 60-65%, Gr1 DD, mild MR;  c. 08/2016 30 Day Event Monitor: No significant tachy/bradyarrhythmias. No afib.  . Follicular neoplasm of thyroid   . Hemorrhage of rectum and anus   . Hyperlipidemia   . Hyperlipidemia, unspecified   . Hypertension   . Leg pain   . Osteoarthritis   . Pernicious anemia   . Small vessel disease ([place])   . Spinal stenosis, lumbar region without neurogenic claudication   . Subdural hematoma ([place])    a. 2013 in setting of syncope.  . Syncope and collapse    a. 2013 - syncope and fall w/ resultant subdural hematoma; b. prev nl echo and stress test @ [facility].  Marland Kitchen Thyroid nodule   . Vertigo & Chronic Dizziness     a. Previously seen by ENT with Physical Therapy.  MRI 2000 small vessel disease    Past Surgical History:  Procedure Laterality Date  . BILATERAL SALPINGOOPHORECTOMY    . COLONOSCOPY  01/26/2009   Diverticulosis  . CYSTOSCOPY    . HEMORRHOIDECTOMY BY SIMPLE LIGATION    . PR COLONOSCOPY FLX W/ENDOSCOPIC MUCOSAL RESECTION  09/02/2014   Procedure: COLONOSCOPY, FLEXIBLE; WITH ENDOSCOPIC MUCOSAL RESECTION; Surgeon: Saunders Revel, MD; Location: [facility]URES [facility]; Service: Gastroenterology  . PR COLSC FLX W/RMVL OF TUMOR POLYP LESION SNARE TQ  09/02/2014   Procedure: COLONOSCOPY FLEX; W/REMOV TUMOR/LES BY SNARE; Surgeon: Saunders Revel, MD; Location: GI PROC[facility]URES [facility]; Service: Gastroenterology  . SKIN SURGERY    . TOTAL ABDOMINAL HYSTERECTOMY W/ BILATERAL SALPINGOOPHORECTOMY  1970s    There were no vitals filed for this visit.  Subjective Assessment - 12/25/17 1026    Subjective  Pt has an appointment with Dr. Manuella Ghazi from neurology this afternoon. She reports no significant improvement in her dizziness since last week but no worsening. She continues to feel better in the morning and again in the evening but is most symptomatic in the middle of the day. She drove herself to therapy today. No specific questions or concerns at this  time.     Pertinent History   Pt was admitted to [facility] for vertigo and discharged 8/17. She had an MRI during admission to rule out CVA which did not show any acute intracranial abnormality. Pt does have a history of R superior cerebellar and left lateral occipital infarcts from 2018. During admission she was seen by this therapist and had a positive R Dix-Hallpike and R roll test for very vigorous horizontal geotrophic nystagmus with 10/10 severity of vertigo. L roll test also confirmed positive horizontal canal testing with less severe vertigo and expected geoptrophic horizontal nystagmus.  Pt treated by therapist for possible R horizontal canal  canalithiasis BPPV with Lempert roll x 2. Retesting is negative for any further symptoms however balance remains notably impaired. Pt was discharged back to her facility and had a follow-up appointment with Dr. Pryor Ochoa at [facility]. She returned for follow-up with physical therapy with an order for ENT for vestibular therapy. 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 At the time of PT evaluation pt denies any further episodes of vertigo since discharge from [facility]. She does report persistent imbalance and has been using her wheelchair for mobility due to fear of falling. She has not had any falls since discharge. Pt reports any other significant changes in her health. She is known to this clinic having been treated for BPPV in the past.    Limitations  Walking    Diagnostic tests  MRI at [facility] showed no acute changes. Chronic R superior cerebellar and L occipital infarct    Patient Stated Goals  Improve strength, balance, and dizziness    Currently in Pain?  No/denies          TREATMENT  Canalith Repositioning Treatment Dix-Hallpike performed with patient. Positive for symptoms on the R side of 6/10 intensity with 8-10 beats of upbeating R torsional nystagmus. Pt also positive with L Dix-Hallpike for much less aggressive upbeating L torsional nystagmus with 2/10 vertigo. Pt treated with R posterior canalith repositioning treatment (CRT). With first CRT pt does not have nystagmus or symptoms when placed in the Dix-Hallpike position. After first CRT pt retested and remains negative in the Dix-Hallpike position. Deferred further CRT at this time due to negative result with repeat testing.  Neuromuscular Re-education  Gait in hallway with horizontal and vertical head turns 75' x 2. Pt reports increase in dizziness during head turns;  Ther-ex  Seated marches x 15 bilateral; Seated hip abduction with manual resistance x 15  bilateral; Seated hip adduction with manual resistance x 15 bilateral; Sit to stand without UE support from mat table at progressively lower height 2 x 10;  Reviewed HEP with patient and advised pt to start strengthening and balance exercises again at home.                       PT Education - 12/25/17 1027    Education Details  exercise form/technique, HEP progression    Person(s) Educated  Patient    Methods  Explanation    Comprehension  Verbalized understanding       PT Short Term Goals - 12/10/17 0916      PT SHORT TERM GOAL #1   Title  Pt will be independent with HEP in order to improve strength and balance in order to decrease fall risk and improve function at home and work.     Time  6  Period  Weeks    Status  New    Target Date  01/18/18      PT SHORT TERM GOAL #2   Title  Pt will report no further episodes of vertigo in order to ensure safety when transitioning in and out of bed and when bending over while performing household responsibilities.    Baseline  12/07/17: No vertigo since discharge from [facility] 12/01/17    Time  6    Period  Weeks    Status  New    Target Date  01/18/18        PT Long Term Goals - 12/10/17 0917      PT LONG TERM GOAL #1   Title  Pt will increase self-selected 10MWT by at least 0.13 m/s in order to demonstrate clinically significant improvement in community ambulation.      Baseline  12/07/17 21.7 self-selected=0.46 m/s, 19.9s fastest=0.50 m/s;    Time  12    Period  Weeks    Status  New    Target Date  03/01/18      PT LONG TERM GOAL #2   Title  Pt will improve BERG by at least 3 points in order to demonstrate clinically significant improvement in balance.      Baseline  12/07/17: 20/56    Time  12    Period  Weeks    Status  New    Target Date  03/01/18      PT LONG TERM GOAL #3   Title  Pt will decrease TUG by at least 23% (7.2s/at or below 24.1s) in order to demonstrate decreased improved function and  decreased fall risk     Baseline  12/07/17: 31.3s    Time  12    Period  Weeks    Status  New    Target Date  03/01/18      PT LONG TERM GOAL #4   Title  Pt will be able to perform a sit to stand from a regular height chair without UE support in order to improve functional independence at home with transfers.     Baseline  12/07/17: unable to perform sit to stand without heavy UE support    Time  12    Period  Weeks    Status  New    Target Date  03/01/18            Plan - 12/25/17 1028    Clinical Impression Statement  Pt presented today with positive R and L Dix-Hallpike testing consistent with bilateral posterior canal BPPV. Pt is more symptomatic on the right and nystagmus is more vigorous. Pt taken one round of Epley maneuver with resolution of nystagmus and symptoms following first maneuver. She is negative with retesting. Performed seated there-ex and ambulation in hallway with head turns. Pt reports dizziness with head turns during ambulation. Reviewed HEP and re-initiated seated exercises and sit to stand without UE support. Daughter requested cervical spine screen to assess for contribution to dizziness. Pt reports only very infrequent and mild neck pain. No gross pain with palpation to neck muscles but pt does have some upper trap trigger points on the right. Pt denies dizziness with slow cervical flexion, extension, and rotation. Will continue to include balance or strength training in future sessions as able. Pt will continue to benefit from skilled PT services to address deficits in strength, balance, and dizziness in order to improve function at home.    Rehab Potential  Fair  PT Frequency  2x / week    PT Duration  12 weeks    PT Treatment/Interventions  ADLs/Self Care Home Management;Aquatic Therapy;Canalith Repostioning;Cryotherapy;Electrical Stimulation;Iontophoresis 4mg /ml Dexamethasone;Moist Heat;Ultrasound;DME Instruction;Gait training;Stair training;Functional  mobility training;Therapeutic activities;Therapeutic exercise;Balance training;Neuromuscular re-education;Cognitive remediation;Patient/family education;Manual techniques;Passive range of motion;Dry needling;Vestibular    PT Next Visit Plan  Recheck Dix-Hallpike and roll test and treat as needed, progress strength, balance, and gait;    PT Home Exercise Plan  VOR x 1 horizontal and x 1 vertical (Pt was previously performing), sit to stand without UE support (elevated surface as needed, rollator in front for safety), seated exercise program (marches, abduction, adduction, LAQ)    Consulted and Agree with Plan of Care  Patient       Patient will benefit from skilled therapeutic intervention in order to improve the following deficits and impairments:  Abnormal gait, Decreased balance, Difficulty walking, Dizziness, Decreased strength  Visit Diagnosis: Dizziness and giddiness  Difficulty in walking, not elsewhere classified  Muscle weakness (generalized)     Problem List Patient Active Problem List   Diagnosis Date Noted  . Vertigo 11/30/2017  . Gait instability 10/19/2016  . Dizziness 11/03/2015  . Hypertension 12/04/2011  . Hyperlipidemia 12/04/2011  . Syncope 12/04/2011  . Diabetes mellitus ([place]) 12/04/2011  . Smoking hx 12/04/2011   Phillips Grout PT, DPT, GCS  Taner Rzepka 12/25/2017, 4:41 PM  [place] MAIN [facility] SERVICES [address] [place], [place], [address] Phone: [phone]   Fax:  [phone]  Name: Ines Rebel MRN: [id number] Date of Birth: May 20, 1924

## 2017-12-27 ENCOUNTER — Other Ambulatory Visit (INDEPENDENT_AMBULATORY_CARE_PROVIDER_SITE_OTHER): Payer: Self-pay | Admitting: Vascular Surgery

## 2017-12-27 DIAGNOSIS — I679 Cerebrovascular disease, unspecified: Secondary | ICD-10-CM

## 2017-12-28 ENCOUNTER — Encounter (INDEPENDENT_AMBULATORY_CARE_PROVIDER_SITE_OTHER): Payer: Self-pay | Admitting: Vascular Surgery

## 2017-12-28 ENCOUNTER — Ambulatory Visit (INDEPENDENT_AMBULATORY_CARE_PROVIDER_SITE_OTHER): Payer: Medicare Other

## 2017-12-28 ENCOUNTER — Ambulatory Visit (INDEPENDENT_AMBULATORY_CARE_PROVIDER_SITE_OTHER): Payer: Medicare Other | Admitting: Vascular Surgery

## 2017-12-28 ENCOUNTER — Ambulatory Visit: Payer: Medicare Other

## 2017-12-28 VITALS — BP 139/71 | HR 80 | Resp 16 | Ht 60.0 in | Wt 120.0 lb

## 2017-12-28 DIAGNOSIS — R42 Dizziness and giddiness: Secondary | ICD-10-CM

## 2017-12-28 DIAGNOSIS — M6281 Muscle weakness (generalized): Secondary | ICD-10-CM

## 2017-12-28 DIAGNOSIS — I6523 Occlusion and stenosis of bilateral carotid arteries: Secondary | ICD-10-CM

## 2017-12-28 DIAGNOSIS — I679 Cerebrovascular disease, unspecified: Secondary | ICD-10-CM | POA: Diagnosis not present

## 2017-12-28 DIAGNOSIS — I10 Essential (primary) hypertension: Secondary | ICD-10-CM | POA: Diagnosis not present

## 2017-12-28 DIAGNOSIS — R262 Difficulty in walking, not elsewhere classified: Secondary | ICD-10-CM

## 2017-12-28 DIAGNOSIS — E782 Mixed hyperlipidemia: Secondary | ICD-10-CM

## 2017-12-28 DIAGNOSIS — I6529 Occlusion and stenosis of unspecified carotid artery: Secondary | ICD-10-CM | POA: Insufficient documentation

## 2017-12-28 NOTE — Assessment & Plan Note (Signed)
Her carotid duplex was stable with 1-39% bilateral carotid artery stenosis. Continue current medical regimen. Have offered her two year follow up, but she prefers to follow up as needed with her age and asymptomatic status. This is reasonable.

## 2017-12-28 NOTE — Progress Notes (Signed)
MRN : [id number]  Claire Washington is a 84 y.o. ([birth year]) female who presents with chief complaint of  Chief Complaint  Patient presents with  . Carotid    78yr follow up  .  History of Present Illness: patient returns in follow up of her carotid disease. She has been having dizziness and vertigo and her neurologist thinks this is a result of her old stroke.  She has no other focal neurologic symptoms. Her carotid duplex was stable with 1-39% bilateral carotid artery stenosis.  Current Outpatient Medications  Medication Sig Dispense Refill  . amLODipine (NORVASC) 5 MG tablet Take 1 tablet (5 mg total) by mouth daily. 90 tablet 4  . atorvastatin (LIPITOR) 10 MG tablet Take 1 tablet (10 mg total) by mouth daily. (Patient taking differently: Take 10 mg by mouth at bedtime. ) 90 tablet 4  . clopidogrel (PLAVIX) 75 MG tablet TAKE 1 TABLET (75 MG TOTAL) BY MOUTH DAILY. 90 tablet 3  . diazepam (VALIUM) 2 MG tablet Take 1 tablet (2 mg total) by mouth 3 (three) times daily as needed (vertigo). 15 tablet 0  . metoprolol tartrate (LOPRESSOR) 25 MG tablet Take 0.5 tablets (12.5 mg total) by mouth 2 (two) times daily. 30 tablet 6  . Multiple Vitamins-Minerals (CENTRUM SILVER PO) Take 1 tablet by mouth daily. Reported on 06/11/2015    . doxycycline (VIBRAMYCIN) 100 MG capsule Take 100 mg by mouth 2 (two) times daily.     No current facility-administered medications for this visit.     Past Medical History:  Diagnosis Date  . Arthritis   . Basal cell carcinoma    basel cell  . Chronic cystitis   . Diabetes mellitus type 2, controlled, without complications ([place])    type 2 or unspecified type diabetes mellitus without mention of complication, not stated as uncontrolled  . Disease of thyroid gland   . Embolic stroke ([place])    a. 07/2016 MRI Brain: small right superior cerebellum and left lateral occipital cortex infarcts;  b. 08/2016 Echo: EF 60-65%, Gr1 DD, mild MR;  c. 08/2016 30 Day Event  Monitor: No significant tachy/bradyarrhythmias. No afib.  . Follicular neoplasm of thyroid   . Hemorrhage of rectum and anus   . Hyperlipidemia   . Hyperlipidemia, unspecified   . Hypertension   . Leg pain   . Osteoarthritis   . Pernicious anemia   . Small vessel disease ([place])   . Spinal stenosis, lumbar region without neurogenic claudication   . Subdural hematoma ([place])    a. 2013 in setting of syncope.  . Syncope and collapse    a. 2013 - syncope and fall w/ resultant subdural hematoma; b. prev nl echo and stress test @ [facility].  Marland Kitchen Thyroid nodule   . Vertigo & Chronic Dizziness    a. Previously seen by ENT with Physical Therapy.  MRI 2000 small vessel disease    Past Surgical History:  Procedure Laterality Date  . BILATERAL SALPINGOOPHORECTOMY    . COLONOSCOPY  01/26/2009   Diverticulosis  . CYSTOSCOPY    . HEMORRHOIDECTOMY BY SIMPLE LIGATION    . PR COLONOSCOPY FLX W/ENDOSCOPIC MUCOSAL RESECTION  09/02/2014   Procedure: COLONOSCOPY, FLEXIBLE; WITH ENDOSCOPIC MUCOSAL RESECTION; Surgeon: Saunders Revel, MD; Location: [facility]URES [facility]; Service: Gastroenterology  . PR COLSC FLX W/RMVL OF TUMOR POLYP LESION SNARE TQ  09/02/2014   Procedure: COLONOSCOPY FLEX; W/REMOV TUMOR/LES BY SNARE; Surgeon: Saunders Revel, MD; Location: GI [facility]URES [facility];  Service: Gastroenterology  . SKIN SURGERY    . TOTAL ABDOMINAL HYSTERECTOMY W/ BILATERAL SALPINGOOPHORECTOMY  1970s    Social History Social History   Tobacco Use  . Smoking status: Former Smoker    Packs/day: 0.25    Years: 20.00    Pack years: 5.00    Types: Cigarettes    Last attempt to quit: 09/15/1993    Years since quitting: 24.3  . Smokeless tobacco: Never Used  Substance Use Topics  . Alcohol use: Yes    Alcohol/week: 2.0 standard drinks    Types: 1 Glasses of wine, 1 Shots of liquor per week    Comment: occassional  . Drug use: No     Family History Family History  Problem Relation Age  of Onset  . Colon cancer Mother   . Heart disease Father   . Heart attack Father   . Prostate cancer Father   . Stroke Father   . Heart attack Brother   . Asthma Daughter   . Diabetes Other        Siblings     Allergies  Allergen Reactions  . Hydrocortisone Swelling  . Sulfa Antibiotics Other (See Comments)    Reaction: unknown  . Tape Dermatitis     REVIEW OF SYSTEMS (Negative unless checked)  Constitutional: [] Weight loss  [] Fever  [] Chills Cardiac: [] Chest pain   [] Chest pressure   [] Palpitations   [] Shortness of breath when laying flat   [] Shortness of breath at rest   [] Shortness of breath with exertion. Vascular:  [] Pain in legs with walking   [] Pain in legs at rest   [] Pain in legs when laying flat   [] Claudication   [] Pain in feet when walking  [] Pain in feet at rest  [] Pain in feet when laying flat   [] History of DVT   [] Phlebitis   [] Swelling in legs   [] Varicose veins   [] Non-healing ulcers Pulmonary:   [] Uses home oxygen   [] Productive cough   [] Hemoptysis   [] Wheeze  [] COPD   [] Asthma Neurologic:  [x] Dizziness  [] Blackouts   [] Seizures   [] History of stroke   [] History of TIA  [] Aphasia   [] Temporary blindness   [] Dysphagia   [] Weakness or numbness in arms   [] Weakness or numbness in legs Musculoskeletal:  [x] Arthritis   [] Joint swelling   [] Joint pain   [x] Low back pain Hematologic:  [] Easy bruising  [] Easy bleeding   [] Hypercoagulable state   [x] Anemic  [] Hepatitis Gastrointestinal:  [] Blood in stool   [] Vomiting blood  [] Gastroesophageal reflux/heartburn   [] Difficulty swallowing. Genitourinary:  [] Chronic kidney disease   [] Difficult urination  [] Frequent urination  [] Burning with urination   [] Blood in urine Skin:  [] Rashes   [] Ulcers   [] Wounds Psychological:  [] History of anxiety   []  History of major depression.  Physical Examination  Vitals:   12/28/17 1325 12/28/17 1326  BP: 140/64 139/71  Pulse: 80   Resp: 16   Weight: 120 lb (54.4 kg)   Height: 5'  (1.524 m)    Body mass index is 23.44 kg/m. Gen:  WD/WN, NAD. Appears younger than stated age Head: [place]/AT, No temporalis wasting. Ear/Nose/Throat: Hearing grossly intact, nares w/o erythema or drainage, trachea midline Eyes: Conjunctiva clear. Sclera non-icteric Neck: Supple.  No bruit  Pulmonary:  Good air movement, equal and clear to auscultation bilaterally.  Cardiac: RRR, No JVD Vascular:  Vessel Right Left  Radial Palpable Palpable  Musculoskeletal: M/S 5/5 throughout.  No deformity or atrophy. No edema. Uses a walker Neurologic: CN 2-12 intact. Sensation grossly intact in extremities.  Symmetrical.  Speech is fluent. Motor exam as listed above. Psychiatric: Judgment intact, Mood & affect appropriate for pt's clinical situation. Dermatologic: No rashes or ulcers noted.  No cellulitis or open wounds.      CBC Lab Results  Component Value Date   WBC 7.4 11/30/2017   HGB 14.9 11/30/2017   HCT 42.7 11/30/2017   MCV 86.0 11/30/2017   PLT 223 11/30/2017    BMET    Component Value Date/Time   NA 139 11/30/2017 0953   NA 140 07/26/2013 1025   K 4.0 11/30/2017 0953   K 4.4 07/26/2013 1025   CL 104 11/30/2017 0953   CL 107 07/26/2013 1025   CO2 27 11/30/2017 0953   CO2 28 07/26/2013 1025   GLUCOSE 133 (H) 11/30/2017 0953   GLUCOSE 118 (H) 07/26/2013 1025   BUN 16 11/30/2017 0953   BUN 21 (H) 07/26/2013 1025   CREATININE 0.48 11/30/2017 0953   CREATININE 0.95 07/26/2013 1025   CALCIUM 9.0 11/30/2017 0953   CALCIUM 9.3 07/26/2013 1025   GFRNONAA >60 11/30/2017 0953   GFRNONAA 54 (L) 07/26/2013 1025   GFRAA >60 11/30/2017 0953   GFRAA >60 07/26/2013 1025   CrCl cannot be calculated (Patient's most recent lab result is older than the maximum 21 days allowed.).  COAG Lab Results  Component Value Date   INR 0.91 10/13/2016    Radiology Mr Brain Wo Contrast  Result Date: 11/30/2017 CLINICAL DATA:  84 year old female  with persistent dizziness/vertigo. EXAM: MRI HEAD WITHOUT CONTRAST TECHNIQUE: Multiplanar, multiecho pulse sequences of the brain and surrounding structures were obtained without intravenous contrast. COMPARISON:  Head CT without contrast 11/22/2017. Brain MRI 10/20/2016. FINDINGS: Brain: No restricted diffusion to suggest acute infarction. No midline shift, mass effect, evidence of mass lesion, ventriculomegaly, extra-axial collection or acute intracranial hemorrhage. Cervicomedullary junction and pituitary are within normal limits. Stable cerebral volume since 2018. Patchy and confluent chronic cerebral white matter T2 and FLAIR hyperintensity is stable. No cortical encephalomalacia. No chronic cerebral blood products. Brainstem and cerebellum remain normal for age. Stable mild for age T2 heterogeneity in the deep gray matter, which mostly resembles perivascular spaces (normal variant). Vascular: Major intracranial vascular flow voids are stable since 2018. Skull and upper cervical spine: Negative for age visible cervical spine. Visualized bone marrow signal is within normal limits. Sinuses/Orbits: Stable and negative. Other: Mastoid air cells remain clear. Visible internal auditory structures appear normal. Scalp and face soft tissues appear negative. IMPRESSION: 1.  No acute intracranial abnormality. 2. Stable noncontrast MRI appearance of the brain, with no evidence of significant prior posterior circulation ischemia. Electronically Signed   By: Genevie Ann M.D.   On: 11/30/2017 14:36    Assessment/Plan Hyperlipidemia lipid control important in reducing the progression of atherosclerotic disease. Continue statin therapy   Vertigo Not from carotid disease.  Follows with neurology.  Carotid stenosis Her carotid duplex was stable with 1-39% bilateral carotid artery stenosis. Continue current medical regimen. Have offered her two year follow up, but she prefers to follow up as needed with her age and  asymptomatic status. This is reasonable.     Leotis Pain, MD  12/28/2017 2:19 PM    This note was created with Dragon medical transcription system.  Any errors from dictation are purely unintentional

## 2017-12-28 NOTE — Therapy (Signed)
[place] MAIN [facility] SERVICES [address] [place], [place], [address] Phone: [phone]   Fax:  [phone]  Physical Therapy Treatment  Patient Details  Name: Claire Washington MRN: [id number] Date of Birth: 11/18/1924 Referring Provider: Dr. Pryor Ochoa   Encounter Date: 12/28/2017  PT End of Session - 12/28/17 1010    Visit Number  5    Number of Visits  25    Date for PT Re-Evaluation  03/01/18    Authorization Type  Progress Note: 5/10    Authorization Time Period  Last goals: 12/07/17    PT Start Time  1015    PT Stop Time  1100    PT Time Calculation (min)  45 min    Equipment Utilized During Treatment  Gait belt    Activity Tolerance  Patient tolerated treatment well    Behavior During Therapy  [facility] for tasks assessed/performed       Past Medical History:  Diagnosis Date  . Arthritis   . Basal cell carcinoma    basel cell  . Chronic cystitis   . Diabetes mellitus type 2, controlled, without complications ([place])    type 2 or unspecified type diabetes mellitus without mention of complication, not stated as uncontrolled  . Disease of thyroid gland   . Embolic stroke ([place])    a. 07/2016 MRI Brain: small right superior cerebellum and left lateral occipital cortex infarcts;  b. 08/2016 Echo: EF 60-65%, Gr1 DD, mild MR;  c. 08/2016 30 Day Event Monitor: No significant tachy/bradyarrhythmias. No afib.  . Follicular neoplasm of thyroid   . Hemorrhage of rectum and anus   . Hyperlipidemia   . Hyperlipidemia, unspecified   . Hypertension   . Leg pain   . Osteoarthritis   . Pernicious anemia   . Small vessel disease ([place])   . Spinal stenosis, lumbar region without neurogenic claudication   . Subdural hematoma ([place])    a. 2013 in setting of syncope.  . Syncope and collapse    a. 2013 - syncope and fall w/ resultant subdural hematoma; b. prev nl echo and stress test @ [facility]  Marland Kitchen Thyroid nodule   . Vertigo & Chronic Dizziness     a. Previously seen by ENT with Physical Therapy.  MRI 2000 small vessel disease    Past Surgical History:  Procedure Laterality Date  . BILATERAL SALPINGOOPHORECTOMY    . COLONOSCOPY  01/26/2009   Diverticulosis  . CYSTOSCOPY    . HEMORRHOIDECTOMY BY SIMPLE LIGATION    . PR COLONOSCOPY FLX W/ENDOSCOPIC MUCOSAL RESECTION  09/02/2014   Procedure: COLONOSCOPY, FLEXIBLE; WITH ENDOSCOPIC MUCOSAL RESECTION; Surgeon: Saunders Revel, MD; Location: [facility]; Service: Gastroenterology  . PR COLSC FLX W/RMVL OF TUMOR POLYP LESION SNARE TQ  09/02/2014   Procedure: COLONOSCOPY FLEX; W/REMOV TUMOR/LES BY SNARE; Surgeon: Saunders Revel, MD; Location: [facility]; Service: Gastroenterology  . SKIN SURGERY    . TOTAL ABDOMINAL HYSTERECTOMY W/ BILATERAL SALPINGOOPHORECTOMY  1970s    There were no vitals filed for this visit.  Subjective Assessment - 12/28/17 1009    Subjective  Pt reports she is doing well today. She has not had any worsening of her symtoms. She saw Dr. Manuella Ghazi but there was no new information revealed. She is performing HEP and has restarted some of her home strengthening exercises. No specific questions or concerns currently.     Pertinent History   Pt was admitted to [facility] for vertigo  and discharged 8/17. She had an MRI during admission to rule out CVA which did not show any acute intracranial abnormality. Pt does have a history of R superior cerebellar and left lateral occipital infarcts from 2018. During admission she was seen by this therapist and had a positive R Dix-Hallpike and R roll test for very vigorous horizontal geotrophic nystagmus with 10/10 severity of vertigo. L roll test also confirmed positive horizontal canal testing with less severe vertigo and expected geoptrophic horizontal nystagmus.  Pt treated by therapist for possible R horizontal canal canalithiasis BPPV with Lempert roll x 2. Retesting is negative for any further symptoms however  balance remains notably impaired. Pt was discharged back to her facility and had a follow-up appointment with Dr. Pryor Ochoa at [facility] ENT. She returned for follow-up with physical therapy with an order for ENT for vestibular therapy. 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 At the time of PT evaluation pt denies any further episodes of vertigo since discharge from [facility]. She does report persistent imbalance and has been using her wheelchair for mobility due to fear of falling. She has not had any falls since discharge. Pt reports any other significant changes in her health. She is known to this clinic having been treated for BPPV in the past.    Limitations  Walking    Diagnostic tests  MRI at [facility] showed no acute changes. Chronic R superior cerebellar and L occipital infarct    Patient Stated Goals  Improve strength, balance, and dizziness    Currently in Pain?  No/denies              TREATMENT  Canalith Repositioning Treatment Dix-Hallpike performed with patient.Positivefor symptoms on the R side of 4/10 intensity with 10-15 beats of upbeating R torsional nystagmus. Pt also positive with L Dix-Hallpike for much less aggressive upbeating L torsional nystagmus (4-5 beats) with 1/10 vertigo. Pt treated with R posterior canalith repositioning treatment (CRT) with 1 minutes holds in each position. After first CRT pt retested and is negative in the R Dix-Hallpike position for both vertigo and nystagmus (some chronic pathological downbeats noted). Second CRT performed with 30s holds.  Neuromuscular Re-education  Gait in hallway with horizontal and vertical head turns 75' x 2 each. Pt reports mild increase in dizziness during head turns but mostly gets fatigued and unsteady; Feet together no UE support in // bars 30s x 2; Staggered balance no UE support alternating forward LE in // bars x 30s each; 4" step taps in // bars  without UE support alternating LE x 10 each;  Ther-ex  Seated marches x 15 bilateral; Seated hip abduction with manual resistance x 15 bilateral; Seated hip adduction with manual resistance x 15 bilateral; Seated LAQ with manual resistance x 15 bilateral; Seated HS curls with green tband x 15 bilateral; Sit to stand without UE support from gray mat table at lowest height 2 x 10;  Pt presented today with positive R and L Dix-Hallpike testing consistent with bilateral posterior canal BPPV. Pt is more symptomatic on the right and nystagmus is more vigorous although it is improved from prior session. Pt taken one round of Epley maneuver with resolution of nystagmus and symptoms following first maneuver. Second maneuver performed to ensure full clearance however shorter duration holds utilized. Performed seated there-ex and ambulation in hallway with head turns again today. Pt also completed balance exercises in // bars. Pt encouraged to continue  HEP and follow-up as scheduled. Pt will continue to benefit from skilled PT services to address deficits in strength, balance, and dizziness in order to improve function at home              PT Education - 12/28/17 1010    Education Details  exercise form/technique    Person(s) Educated  Patient    Methods  Explanation    Comprehension  Verbalized understanding       PT Short Term Goals - 12/10/17 0916      PT SHORT TERM GOAL #1   Title  Pt will be independent with HEP in order to improve strength and balance in order to decrease fall risk and improve function at home and work.     Time  6    Period  Weeks    Status  New    Target Date  01/18/18      PT SHORT TERM GOAL #2   Title  Pt will report no further episodes of vertigo in order to ensure safety when transitioning in and out of bed and when bending over while performing household responsibilities.    Baseline  12/07/17: No vertigo since discharge from [facility] 12/01/17    Time  6     Period  Weeks    Status  New    Target Date  01/18/18        PT Long Term Goals - 12/10/17 0917      PT LONG TERM GOAL #1   Title  Pt will increase self-selected 10MWT by at least 0.13 m/s in order to demonstrate clinically significant improvement in community ambulation.      Baseline  12/07/17 21.7 self-selected=0.46 m/s, 19.9s fastest=0.50 m/s;    Time  12    Period  Weeks    Status  New    Target Date  03/01/18      PT LONG TERM GOAL #2   Title  Pt will improve BERG by at least 3 points in order to demonstrate clinically significant improvement in balance.      Baseline  12/07/17: 20/56    Time  12    Period  Weeks    Status  New    Target Date  03/01/18      PT LONG TERM GOAL #3   Title  Pt will decrease TUG by at least 23% (7.2s/at or below 24.1s) in order to demonstrate decreased improved function and decreased fall risk     Baseline  12/07/17: 31.3s    Time  12    Period  Weeks    Status  New    Target Date  03/01/18      PT LONG TERM GOAL #4   Title  Pt will be able to perform a sit to stand from a regular height chair without UE support in order to improve functional independence at home with transfers.     Baseline  12/07/17: unable to perform sit to stand without heavy UE support    Time  12    Period  Weeks    Status  New    Target Date  03/01/18            Plan - 12/28/17 1010    Clinical Impression Statement  Pt presented today with positive R and L Dix-Hallpike testing consistent with bilateral posterior canal BPPV. Pt is more symptomatic on the right and nystagmus is more vigorous although it is improved from prior session. Pt  taken one round of Epley maneuver with resolution of nystagmus and symptoms following first maneuver. Second maneuver performed to ensure full clearance however shorter duration holds utilized. Performed seated there-ex and ambulation in hallway with head turns again today. Pt also completed balance exercises in // bars. Pt  encouraged to continue HEP and follow-up as scheduled. Pt will continue to benefit from skilled PT services to address deficits in strength, balance, and dizziness in order to improve function at home    Rehab Potential  Fair    PT Frequency  2x / week    PT Duration  12 weeks    PT Treatment/Interventions  ADLs/Self Care Home Management;Aquatic Therapy;Canalith Repostioning;Cryotherapy;Electrical Stimulation;Iontophoresis 4mg /ml Dexamethasone;Moist Heat;Ultrasound;DME Instruction;Gait training;Stair training;Functional mobility training;Therapeutic activities;Therapeutic exercise;Balance training;Neuromuscular re-education;Cognitive remediation;Patient/family education;Manual techniques;Passive range of motion;Dry needling;Vestibular    PT Next Visit Plan  Recheck Dix-Hallpike and roll test and treat as needed, progress strength, balance, and gait;    PT Home Exercise Plan  VOR x 1 horizontal and x 1 vertical (Pt was previously performing), sit to stand without UE support (elevated surface as needed, rollator in front for safety), seated exercise program (marches, abduction, adduction, LAQ)    Consulted and Agree with Plan of Care  Patient       Patient will benefit from skilled therapeutic intervention in order to improve the following deficits and impairments:  Abnormal gait, Decreased balance, Difficulty walking, Dizziness, Decreased strength  Visit Diagnosis: Dizziness and giddiness  Difficulty in walking, not elsewhere classified  Muscle weakness (generalized)     Problem List Patient Active Problem List   Diagnosis Date Noted  . Vertigo 11/30/2017  . Gait instability 10/19/2016  . Dizziness 11/03/2015  . Hypertension 12/04/2011  . Hyperlipidemia 12/04/2011  . Syncope 12/04/2011  . Diabetes mellitus ([place]) 12/04/2011  . Smoking hx 12/04/2011   Phillips Grout PT, DPT, GCS  Huprich,Jason 12/28/2017, 10:57 AM  [place] MAIN [facility]  SERVICES [address] [place], [place], [address] Phone: [phone]   Fax:  [phone]  Name: Claire Washington MRN: [id number] Date of Birth: [birth year]

## 2017-12-28 NOTE — Assessment & Plan Note (Signed)
lipid control important in reducing the progression of atherosclerotic disease. Continue statin therapy  

## 2017-12-28 NOTE — Assessment & Plan Note (Signed)
Not from carotid disease.  Follows with neurology.

## 2018-01-01 ENCOUNTER — Ambulatory Visit: Payer: Medicare Other | Admitting: Physical Therapy

## 2018-01-01 ENCOUNTER — Other Ambulatory Visit: Payer: Self-pay

## 2018-01-01 ENCOUNTER — Encounter: Payer: Self-pay | Admitting: Physical Therapy

## 2018-01-01 DIAGNOSIS — M6281 Muscle weakness (generalized): Secondary | ICD-10-CM

## 2018-01-01 DIAGNOSIS — R262 Difficulty in walking, not elsewhere classified: Secondary | ICD-10-CM

## 2018-01-01 DIAGNOSIS — R42 Dizziness and giddiness: Secondary | ICD-10-CM

## 2018-01-01 NOTE — Therapy (Signed)
[place] MAIN [facility] SERVICES [address] [place], [place], [address] Phone: [phone]   Fax:  [phone]  Physical Therapy Treatment  Patient Details  Name: Claire Washington MRN: [id number] Date of Birth: 1925-03-06 Referring Provider: Dr. Pryor Ochoa   Encounter Date: 01/01/2018  PT End of Session - 01/01/18 1020    Visit Number  6    Number of Visits  25    Date for PT Re-Evaluation  03/01/18    Authorization Type  Progress Note: 6/10    Authorization Time Period  Last goals: 12/07/17    PT Start Time  1015    PT Stop Time  1059    PT Time Calculation (min)  44 min    Equipment Utilized During Treatment  Gait belt    Activity Tolerance  Patient tolerated treatment well    Behavior During Therapy  [facility] for tasks assessed/performed       Past Medical History:  Diagnosis Date  . Arthritis   . Basal cell carcinoma    basel cell  . Chronic cystitis   . Diabetes mellitus type 2, controlled, without complications ([place])    type 2 or unspecified type diabetes mellitus without mention of complication, not stated as uncontrolled  . Disease of thyroid gland   . Embolic stroke ([place])    a. 07/2016 MRI Brain: small right superior cerebellum and left lateral occipital cortex infarcts;  b. 08/2016 Echo: EF 60-65%, Gr1 DD, mild MR;  c. 08/2016 30 Day Event Monitor: No significant tachy/bradyarrhythmias. No afib.  . Follicular neoplasm of thyroid   . Hemorrhage of rectum and anus   . Hyperlipidemia   . Hyperlipidemia, unspecified   . Hypertension   . Leg pain   . Osteoarthritis   . Pernicious anemia   . Small vessel disease ([place])   . Spinal stenosis, lumbar region without neurogenic claudication   . Subdural hematoma ([place])    a. 2013 in setting of syncope.  . Syncope and collapse    a. 2013 - syncope and fall w/ resultant subdural hematoma; b. prev nl echo and stress test @ [facility].  Marland Kitchen Thyroid nodule   . Vertigo & Chronic Dizziness     a. Previously seen by ENT with Physical Therapy.  MRI 2000 small vessel disease    Past Surgical History:  Procedure Laterality Date  . BILATERAL SALPINGOOPHORECTOMY    . COLONOSCOPY  01/26/2009   Diverticulosis  . CYSTOSCOPY    . HEMORRHOIDECTOMY BY SIMPLE LIGATION    . PR COLONOSCOPY FLX W/ENDOSCOPIC MUCOSAL RESECTION  09/02/2014   Procedure: COLONOSCOPY, FLEXIBLE; WITH ENDOSCOPIC MUCOSAL RESECTION; Surgeon: Saunders Revel, MD; Location: [facility]; Service: Gastroenterology  . PR COLSC FLX W/RMVL OF TUMOR POLYP LESION SNARE TQ  09/02/2014   Procedure: COLONOSCOPY FLEX; W/REMOV TUMOR/LES BY SNARE; Surgeon: Saunders Revel, MD; Location: [facility]; Service: Gastroenterology  . SKIN SURGERY    . TOTAL ABDOMINAL HYSTERECTOMY W/ BILATERAL SALPINGOOPHORECTOMY  1970s    There were no vitals filed for this visit.  Subjective Assessment - 01/01/18 1018    Subjective  Patient states she is feeling a little better. Patient states she is "getting back into them" in regards to the HEP and states she has no problems or concerns. Patient states she felt a little dizziness in bed this morning but did not feel any on dizziness on Saturday morning after she was seen for therapy on Friday and had canalith  repositioning maneuvers performed. Noted that patient has left eye redness without drainage. Patient reports she has an appointment with her eye doctor in the next 2 weeks.    Pertinent History   Pt was admitted to [facility] for vertigo and discharged 8/17. She had an MRI during admission to rule out CVA which did not show any acute intracranial abnormality. Pt does have a history of R superior cerebellar and left lateral occipital infarcts from 2018. During admission she was seen by this therapist and had a positive R Dix-Hallpike and R roll test for very vigorous horizontal geotrophic nystagmus with 10/10 severity of vertigo. L roll test also confirmed positive horizontal  canal testing with less severe vertigo and expected geoptrophic horizontal nystagmus.  Pt treated by therapist for possible R horizontal canal canalithiasis BPPV with Lempert roll x 2. Retesting is negative for any further symptoms however balance remains notably impaired. Pt was discharged back to her facility and had a follow-up appointment with Dr. Pryor Ochoa at [facility]. She returned for follow-up with physical therapy with an order for ENT for vestibular therapy. 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 At the time of PT evaluation pt denies any further episodes of vertigo since discharge from [facility]. She does report persistent imbalance and has been using her wheelchair for mobility due to fear of falling. She has not had any falls since discharge. Pt reports any other significant changes in her health. She is known to this clinic having been treated for BPPV in the past.    Limitations  Walking    Diagnostic tests  MRI at [facility] showed no acute changes. Chronic R superior cerebellar and L occipital infarct    Patient Stated Goals  Improve strength, balance, and dizziness    Currently in Pain?  No/denies      Canalith Repositioning Manuever: Patient with limited cervical extension ROM therefore performed testing on inclined mat table. On inverted mat table performed right Dix-Hallpike testing and it was positive with right upbeating torsional nystagmus of short duration. Patient reports 4/10 nystagmus. Performed right canalith repositioning maneuver (CRT). Repeated R CRT for a total of 2 maneuvers with retesting between maneuvers.   Note: After canalith repositioning maneuver, performed neuromuscular re-education  Neuromuscular Re-education: Semi-tandem progressions:  On firm surface, patient performed semi-tandem progressions with alternating lead leg with and without horizontal and vertical head turns.  Patient with sudden  loss of balance at times requiring mod/min A to correct and patient reaching for // bar at times to correct losses of balance.   Cone tapping: On firm surface, patient performed foot tapping to cones in series of one and two targets as called out by therapist. Patient touching // bar for support due to losses of balance multiple times. Patient required one hand support to perform activity. Repeated activity with foot tapping to balance pods in series of one and two targets as called out by therapist. Patient touching // bar for support due to losses of balance a few times.  Patient progressed from one hand support to no UEs support with practice. Patient requiring assistance ranging from CGA to Min A at times due to loss of balance.  Patient has an especially difficult time with single leg support standing on her left leg. Patient has difficulty with motor control with this activity.      PT Education - 01/01/18 1019    Education Details  exercise form and  technique    Person(s) Educated  Patient    Methods  Explanation    Comprehension  Verbalized understanding       PT Short Term Goals - 12/10/17 0916      PT SHORT TERM GOAL #1   Title  Pt will be independent with HEP in order to improve strength and balance in order to decrease fall risk and improve function at home and work.     Time  6    Period  Weeks    Status  New    Target Date  01/18/18      PT SHORT TERM GOAL #2   Title  Pt will report no further episodes of vertigo in order to ensure safety when transitioning in and out of bed and when bending over while performing household responsibilities.    Baseline  12/07/17: No vertigo since discharge from [facility] 12/01/17    Time  6    Period  Weeks    Status  New    Target Date  01/18/18        PT Long Term Goals - 12/10/17 0917      PT LONG TERM GOAL #1   Title  Pt will increase self-selected 10MWT by at least 0.13 m/s in order to demonstrate clinically significant  improvement in community ambulation.      Baseline  12/07/17 21.7 self-selected=0.46 m/s, 19.9s fastest=0.50 m/s;    Time  12    Period  Weeks    Status  New    Target Date  03/01/18      PT LONG TERM GOAL #2   Title  Pt will improve BERG by at least 3 points in order to demonstrate clinically significant improvement in balance.      Baseline  12/07/17: 20/56    Time  12    Period  Weeks    Status  New    Target Date  03/01/18      PT LONG TERM GOAL #3   Title  Pt will decrease TUG by at least 23% (7.2s/at or below 24.1s) in order to demonstrate decreased improved function and decreased fall risk     Baseline  12/07/17: 31.3s    Time  12    Period  Weeks    Status  New    Target Date  03/01/18      PT LONG TERM GOAL #4   Title  Pt will be able to perform a sit to stand from a regular height chair without UE support in order to improve functional independence at home with transfers.     Baseline  12/07/17: unable to perform sit to stand without heavy UE support    Time  12    Period  Weeks    Status  New    Target Date  03/01/18            Plan - 01/01/18 1020    Clinical Impression Statement  Plan on repeating functional outcome measures next session and assessing progress towards goals. Patient with positive right Dix-Hallpike test with nystagmus and reproduction of vertiginous symptoms. Performed right canalith repositioning maneuver and patient would benefit from continued follow-up until clearance of particles is obtained. Patient demonstrates significant balance deficits and would benefit from continued physical therapy services to further address functional deficits and goals in order to help reduce patient's falls risk.     Rehab Potential  Fair    PT Frequency  2x / week  PT Duration  12 weeks    PT Treatment/Interventions  ADLs/Self Care Home Management;Aquatic Therapy;Canalith Repostioning;Cryotherapy;Electrical Stimulation;Iontophoresis 4mg /ml Dexamethasone;Moist  Heat;Ultrasound;DME Instruction;Gait training;Stair training;Functional mobility training;Therapeutic activities;Therapeutic exercise;Balance training;Neuromuscular re-education;Cognitive remediation;Patient/family education;Manual techniques;Passive range of motion;Dry needling;Vestibular    PT Next Visit Plan  Recheck Dix-Hallpike and roll test and treat as needed, progress strength, balance, and gait; Repeat functional outcome measures next visit    PT Home Exercise Plan  VOR x 1 horizontal and x 1 vertical (Pt was previously performing), sit to stand without UE support (elevated surface as needed, rollator in front for safety), seated exercise program (marches, abduction, adduction, LAQ)    Consulted and Agree with Plan of Care  Patient       Patient will benefit from skilled therapeutic intervention in order to improve the following deficits and impairments:  Abnormal gait, Decreased balance, Difficulty walking, Dizziness, Decreased strength  Visit Diagnosis: Dizziness and giddiness  Difficulty in walking, not elsewhere classified  Muscle weakness (generalized)     Problem List Patient Active Problem List   Diagnosis Date Noted  . Carotid stenosis 12/28/2017  . Vertigo 11/30/2017  . Gait instability 10/19/2016  . Dizziness 11/03/2015  . Hypertension 12/04/2011  . Hyperlipidemia 12/04/2011  . Syncope 12/04/2011  . Diabetes mellitus ([place]) 12/04/2011  . Smoking hx 12/04/2011   Lady Deutscher PT, DPT [id number] Lady Deutscher 01/02/2018, 9:57 AM  [place] MAIN [facility] SERVICES [address] [place], [place], [address] Phone: [phone]   Fax:  [phone]  Name: Claire Washington MRN: [id number] Date of Birth: [birth year]

## 2018-01-04 ENCOUNTER — Ambulatory Visit: Payer: Medicare Other

## 2018-01-04 DIAGNOSIS — R42 Dizziness and giddiness: Secondary | ICD-10-CM

## 2018-01-04 DIAGNOSIS — R262 Difficulty in walking, not elsewhere classified: Secondary | ICD-10-CM

## 2018-01-04 NOTE — Therapy (Signed)
[place] MAIN [facility] SERVICES [address] [place], [place], [address] Phone: [phone]   Fax:  [phone]  Physical Therapy Treatment  Patient Details  Name: Claire Washington MRN: [id number] Date of Birth: 07/09/1924 Referring Provider: Dr. Pryor Ochoa   Encounter Date: 01/04/2018  PT End of Session - 01/05/18 2047    Visit Number  7    Number of Visits  25    Date for PT Re-Evaluation  03/01/18    Authorization Type  Progress Note: 7/10    Authorization Time Period  Last goals: 12/07/17    PT Start Time  1020    PT Stop Time  1052    PT Time Calculation (min)  32 min    Equipment Utilized During Treatment  Gait belt    Activity Tolerance  Patient tolerated treatment well    Behavior During Therapy  [facility] for tasks assessed/performed       Past Medical History:  Diagnosis Date  . Arthritis   . Basal cell carcinoma    basel cell  . Chronic cystitis   . Diabetes mellitus type 2, controlled, without complications ([place])    type 2 or unspecified type diabetes mellitus without mention of complication, not stated as uncontrolled  . Disease of thyroid gland   . Embolic stroke ([place])    a. 07/2016 MRI Brain: small right superior cerebellum and left lateral occipital cortex infarcts;  b. 08/2016 Echo: EF 60-65%, Gr1 DD, mild MR;  c. 08/2016 30 Day Event Monitor: No significant tachy/bradyarrhythmias. No afib.  . Follicular neoplasm of thyroid   . Hemorrhage of rectum and anus   . Hyperlipidemia   . Hyperlipidemia, unspecified   . Hypertension   . Leg pain   . Osteoarthritis   . Pernicious anemia   . Small vessel disease ([place])   . Spinal stenosis, lumbar region without neurogenic claudication   . Subdural hematoma ([place])    a. 2013 in setting of syncope.  . Syncope and collapse    a. 2013 - syncope and fall w/ resultant subdural hematoma; b. prev nl echo and stress test @ [facility].  Marland Kitchen Thyroid nodule   . Vertigo & Chronic Dizziness     a. Previously seen by ENT with Physical Therapy.  MRI 2000 small vessel disease    Past Surgical History:  Procedure Laterality Date  . BILATERAL SALPINGOOPHORECTOMY    . COLONOSCOPY  01/26/2009   Diverticulosis  . CYSTOSCOPY    . HEMORRHOIDECTOMY BY SIMPLE LIGATION    . PR COLONOSCOPY FLX W/ENDOSCOPIC MUCOSAL RESECTION  09/02/2014   Procedure: COLONOSCOPY, FLEXIBLE; WITH ENDOSCOPIC MUCOSAL RESECTION; Surgeon: Saunders Revel, MD; Location: [facility]; Service: Gastroenterology  . PR COLSC FLX W/RMVL OF TUMOR POLYP LESION SNARE TQ  09/02/2014   Procedure: COLONOSCOPY FLEX; W/REMOV TUMOR/LES BY SNARE; Surgeon: Saunders Revel, MD; Location: GI [facility]URES [facility]; Service: Gastroenterology  . SKIN SURGERY    . TOTAL ABDOMINAL HYSTERECTOMY W/ BILATERAL SALPINGOOPHORECTOMY  1970s    There were no vitals filed for this visit.    TREATMENT   Canalith Repositioning Manuever: Patient with limited cervical extension ROM therefore performed testing and treatment on inclined mat table. On inverted mat table performed right Dix-Hallpike testing which is negative. Positive testing on the left for a few beats of upbeating L torsional nystagmus with 2/10 vertigo. Performed left canalith repositioning maneuver (CRT) with 2 minute holds in each positiong. Repeated L Dix-Hallpike testing which is negative  however after approximately 20 seconds pt presents with vigorous geotrophic nystagmus with severe vertigo. Performed roll testing and confirmed positive testing for L horizontal canal BPPV. Pt treated with Lempert roll and subsequent roll testi is negative bilaterally. Deferred further balance and strength training on this date. Pt will need updated outcome measures and goals as well as progress note at next visit.                    PT Short Term Goals - 12/10/17 0916      PT SHORT TERM GOAL #1   Title  Pt will be independent with HEP in order to improve  strength and balance in order to decrease fall risk and improve function at home and work.     Time  6    Period  Weeks    Status  New    Target Date  01/18/18      PT SHORT TERM GOAL #2   Title  Pt will report no further episodes of vertigo in order to ensure safety when transitioning in and out of bed and when bending over while performing household responsibilities.    Baseline  12/07/17: No vertigo since discharge from [facility] 12/01/17    Time  6    Period  Weeks    Status  New    Target Date  01/18/18        PT Long Term Goals - 12/10/17 0917      PT LONG TERM GOAL #1   Title  Pt will increase self-selected 10MWT by at least 0.13 m/s in order to demonstrate clinically significant improvement in community ambulation.      Baseline  12/07/17 21.7 self-selected=0.46 m/s, 19.9s fastest=0.50 m/s;    Time  12    Period  Weeks    Status  New    Target Date  03/01/18      PT LONG TERM GOAL #2   Title  Pt will improve BERG by at least 3 points in order to demonstrate clinically significant improvement in balance.      Baseline  12/07/17: 20/56    Time  12    Period  Weeks    Status  New    Target Date  03/01/18      PT LONG TERM GOAL #3   Title  Pt will decrease TUG by at least 23% (7.2s/at or below 24.1s) in order to demonstrate decreased improved function and decreased fall risk     Baseline  12/07/17: 31.3s    Time  12    Period  Weeks    Status  New    Target Date  03/01/18      PT LONG TERM GOAL #4   Title  Pt will be able to perform a sit to stand from a regular height chair without UE support in order to improve functional independence at home with transfers.     Baseline  12/07/17: unable to perform sit to stand without heavy UE support    Time  12    Period  Weeks    Status  New    Target Date  03/01/18            Plan - 01/05/18 2048    Clinical Impression Statement  Pt with negative Dix-Hallpike testing on the R side today. Mildly positive on the L side so  patient treated with Epley maneuver. During retesting with Dix-Hallpike on the L side pt is negative initially  but after approximately 20 seconds she starts with a vigorous geotrophic nystagmus with severe vertigo. Pt is subsequently retested with sidelying test which reveals possible L horizontal canal BPPV. Pt treated with Lempert roll and subsequent testing is negative. Deferred further balance and strength training on this date. Pt will need updated outcome and goals as well as progress note. Will continue to treat patient as needed with canalith repositioning treatment in future sessions and then progress to balance and strength training.    Rehab Potential  Fair    PT Frequency  2x / week    PT Duration  12 weeks    PT Treatment/Interventions  ADLs/Self Care Home Management;Aquatic Therapy;Canalith Repostioning;Cryotherapy;Electrical Stimulation;Iontophoresis 4mg /ml Dexamethasone;Moist Heat;Ultrasound;DME Instruction;Gait training;Stair training;Functional mobility training;Therapeutic activities;Therapeutic exercise;Balance training;Neuromuscular re-education;Cognitive remediation;Patient/family education;Manual techniques;Passive range of motion;Dry needling;Vestibular    PT Next Visit Plan  Recheck Dix-Hallpike and roll test and treat as needed, progress strength, balance, and gait; Repeat functional outcome measures next visit    PT Home Exercise Plan  VOR x 1 horizontal and x 1 vertical (Pt was previously performing), sit to stand without UE support (elevated surface as needed, rollator in front for safety), seated exercise program (marches, abduction, adduction, LAQ)    Consulted and Agree with Plan of Care  Patient       Patient will benefit from skilled therapeutic intervention in order to improve the following deficits and impairments:  Abnormal gait, Decreased balance, Difficulty walking, Dizziness, Decreased strength  Visit Diagnosis: Dizziness and giddiness  Difficulty in walking,  not elsewhere classified     Problem List Patient Active Problem List   Diagnosis Date Noted  . Carotid stenosis 12/28/2017  . Vertigo 11/30/2017  . Gait instability 10/19/2016  . Dizziness 11/03/2015  . Hypertension 12/04/2011  . Hyperlipidemia 12/04/2011  . Syncope 12/04/2011  . Diabetes mellitus ([place]) 12/04/2011  . Smoking hx 12/04/2011   Phillips Grout PT, DPT, GCS  Lashina Milles 01/05/2018, 9:22 PM  [place] MAIN [facility] SERVICES [address] [place], [place], [address] Phone: [phone]   Fax:  [phone]  Name: Annica Marinello MRN: [id number] Date of Birth: 03/20/1925

## 2018-01-08 ENCOUNTER — Ambulatory Visit: Payer: Medicare Other | Admitting: Physical Therapy

## 2018-01-08 ENCOUNTER — Encounter: Payer: Self-pay | Admitting: Physical Therapy

## 2018-01-08 DIAGNOSIS — R262 Difficulty in walking, not elsewhere classified: Secondary | ICD-10-CM

## 2018-01-08 DIAGNOSIS — M6281 Muscle weakness (generalized): Secondary | ICD-10-CM

## 2018-01-08 DIAGNOSIS — R42 Dizziness and giddiness: Secondary | ICD-10-CM

## 2018-01-08 NOTE — Therapy (Addendum)
[place] MAIN [facility] SERVICES [address] [place], [place], [address] Phone: [phone]   Fax:  [phone]  Physical Therapy Treatment/Progress Note Dates of reporting period: 12/07/17-01/08/18  Patient Details  Name: Claire Washington MRN: [id number] Date of Birth: 1924-12-18 Referring Provider: Dr. Pryor Ochoa   Encounter Date: 01/08/2018  PT End of Session - 01/08/18 1015    Visit Number  8    Number of Visits  25    Date for PT Re-Evaluation  03/01/18    Authorization Type  Progress Note: 8/10    Authorization Time Period  Last goals: 01/08/18    PT Start Time  1016    PT Stop Time  1105    PT Time Calculation (min)  49 min    Equipment Utilized During Treatment  Gait belt    Activity Tolerance  Patient tolerated treatment well    Behavior During Therapy  [name] for tasks assessed/performed       Past Medical History:  Diagnosis Date  . Arthritis   . Basal cell carcinoma    basel cell  . Chronic cystitis   . Diabetes mellitus type 2, controlled, without complications ([place])    type 2 or unspecified type diabetes mellitus without mention of complication, not stated as uncontrolled  . Disease of thyroid gland   . Embolic stroke ([place])    a. 07/2016 MRI Brain: small right superior cerebellum and left lateral occipital cortex infarcts;  b. 08/2016 Echo: EF 60-65%, Gr1 DD, mild MR;  c. 08/2016 30 Day Event Monitor: No significant tachy/bradyarrhythmias. No afib.  . Follicular neoplasm of thyroid   . Hemorrhage of rectum and anus   . Hyperlipidemia   . Hyperlipidemia, unspecified   . Hypertension   . Leg pain   . Osteoarthritis   . Pernicious anemia   . Small vessel disease ([place])   . Spinal stenosis, lumbar region without neurogenic claudication   . Subdural hematoma ([place])    a. 2013 in setting of syncope.  . Syncope and collapse    a. 2013 - syncope and fall w/ resultant subdural hematoma; b. prev nl echo and stress test @ [facility].  Marland Kitchen Thyroid nodule   . Vertigo & Chronic Dizziness    a. Previously seen by ENT with Physical Therapy.  MRI 2000 small vessel disease    Past Surgical History:  Procedure Laterality Date  . BILATERAL SALPINGOOPHORECTOMY    . COLONOSCOPY  01/26/2009   Diverticulosis  . CYSTOSCOPY    . HEMORRHOIDECTOMY BY SIMPLE LIGATION    . PR COLONOSCOPY FLX W/ENDOSCOPIC MUCOSAL RESECTION  09/02/2014   Procedure: COLONOSCOPY, FLEXIBLE; WITH ENDOSCOPIC MUCOSAL RESECTION; Surgeon: Saunders Revel, MD; Location: [facility]; Service: Gastroenterology  . PR COLSC FLX W/RMVL OF TUMOR POLYP LESION SNARE TQ  09/02/2014   Procedure: COLONOSCOPY FLEX; W/REMOV TUMOR/LES BY SNARE; Surgeon: Saunders Revel, MD; Location: [facility]; Service: Gastroenterology  . SKIN SURGERY    . TOTAL ABDOMINAL HYSTERECTOMY W/ BILATERAL SALPINGOOPHORECTOMY  1970s    There were no vitals filed for this visit.  Subjective Assessment - 01/08/18 1015    Subjective  Patient states is having dizziness now because she turned her head quickly to look behind her.     Pertinent History   Pt was admitted to [facility] for vertigo and discharged 8/17. She had an MRI during admission to rule out CVA which did not show any acute intracranial abnormality. Pt does have  a history of R superior cerebellar and left lateral occipital infarcts from 2018. During admission she was seen by this therapist and had a positive R Dix-Hallpike and R roll test for very vigorous horizontal geotrophic nystagmus with 10/10 severity of vertigo. L roll test also confirmed positive horizontal canal testing with less severe vertigo and expected geoptrophic horizontal nystagmus.  Pt treated by therapist for possible R horizontal canal canalithiasis BPPV with Lempert roll x 2. Retesting is negative for any further symptoms however balance remains notably impaired. Pt was discharged back to her facility and had a follow-up appointment with  Dr. Pryor Ochoa at [facility]. She returned for follow-up with physical therapy with an order for ENT for vestibular therapy. 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 At the time of PT evaluation pt denies any further episodes of vertigo since discharge from [facility]. She does report persistent imbalance and has been using her wheelchair for mobility due to fear of falling. She has not had any falls since discharge. Pt reports any other significant changes in her health. She is known to this clinic having been treated for BPPV in the past.    Limitations  Walking    Diagnostic tests  MRI at [facility] showed no acute changes. Chronic R superior cerebellar and L occipital infarct    Patient Stated Goals  Improve strength, balance, and dizziness    Currently in Pain?  No/denies        [facility] PT Assessment - 01/09/18 1507      Berg Balance Test   Sit to Stand  Able to stand without using hands and stabilize independently    Standing Unsupported  Able to stand safely 2 minutes    Sitting with Back Unsupported but Feet Supported on Floor or Stool  Able to sit safely and securely 2 minutes    Stand to Sit  Controls descent by using hands    Transfers  Able to transfer with verbal cueing and /or supervision    Standing Unsupported with Eyes Closed  Needs help to keep from falling    Standing Ubsupported with Feet Together  Needs help to attain position and unable to hold for 15 seconds    From Standing, Reach Forward with Outstretched Arm  Reaches forward but needs supervision    From Standing Position, Pick up Object from Floor  Unable to try/needs assist to keep balance    From Standing Position, Turn to Look Behind Over each Shoulder  Turn sideways only but maintains balance    Turn 360 Degrees  Needs close supervision or verbal cueing    Standing Unsupported, Alternately Place Feet on Step/Stool  Needs assistance to keep from falling or  unable to try    Standing Unsupported, One Foot in Front  Able to place foot tandem independently and hold 30 seconds    Standing on One Leg  Tries to lift leg/unable to hold 3 seconds but remains standing independently    Total Score  26    Berg comment:  scored 20/56 on 12/10/17      Timed Up and Go Test   TUG  Normal TUG    Normal TUG (seconds)  17   with use of 3 wheel walker      Neuromuscular Re-education: Performed Right and Left Dix-Hallpike test on inverted mat table and both were negative with no nystagmus observed and patient denied vertigo. Performed left and right  horizontal canal testing and both were negative with no nystagmus observed and patient denied vertigo.  Physical Performance: Performed Berg balance test, TUG, sit to stand and 10 meter walking speed functional outcome measures. Discussed test results and compared to prior testing. Discussed progress towards goals.  Patient able to perform sit to stand without use of UEs.   Functional Outcome Measure Score Comments  10 Meter walking speed 13 sec; 0.76 m/sec with 3 wheel RW Within normative values as compared to age and gender normative values  Merrilee Jansky  25/56 Falls risk; in need of intervention  TUG 17 seconds High falls risk; in need of intervention (note:11.1 or greater is considered high falls risk for patients with vestibular disorders)      PT Education - 01/08/18 1015    Education Details  reviewed functional outcome testing and discussed progress towards goals and plan of care    Person(s) Educated  Patient    Methods  Explanation    Comprehension  Verbalized understanding       PT Short Term Goals - 01/08/18 1059      PT SHORT TERM GOAL #1   Title  Pt will be independent with HEP in order to improve strength and balance in order to decrease fall risk and improve function at home and work.     Time  6    Period  Weeks    Status  Achieved      PT SHORT TERM GOAL #2   Title  Pt will report no  further episodes of vertigo in order to ensure safety when transitioning in and out of bed and when bending over while performing household responsibilities.    Baseline  12/07/17: No vertigo since discharge from [facility] 12/01/17; no episodes of vertigo    Time  6    Period  Weeks    Status  Achieved        PT Long Term Goals - 01/08/18 1100      PT LONG TERM GOAL #1   Title  Pt will increase self-selected 10MWT by at least 0.13 m/s in order to demonstrate clinically significant improvement in community ambulation.      Baseline  12/07/17 21.7 self-selected=0.46 m/s, 19.9s fastest=0.50 m/s;    Time  12    Period  Weeks    Status  Achieved      PT LONG TERM GOAL #2   Title  Pt will improve BERG by at least 3 points in order to demonstrate clinically significant improvement in balance.      Baseline  12/07/17: 20/56; scored 25/56 on 01/08/18    Time  12    Period  Weeks    Status  Achieved      PT LONG TERM GOAL #3   Title  Pt will decrease TUG by at least 23% (7.2s/at or below 24.1s) in order to demonstrate decreased improved function and decreased fall risk     Baseline  12/07/17: 31.3s; scored 17 seconds on 01/08/18    Time  12    Period  Weeks    Status  Achieved      PT LONG TERM GOAL #4   Title  Pt will be able to perform a sit to stand from a regular height chair without UE support in order to improve functional independence at home with transfers.     Baseline  12/07/17: unable to perform sit to stand without heavy UE support    Time  12  Period  Weeks    Status  Achieved      PT LONG TERM GOAL #5   Title  Pt will improve BERG by at least 3 points in order to demonstrate clinically significant improvement in balance.      Baseline  12/07/17: 20/56; scored 25/56 on 01/08/18    Time  8    Period  Weeks    Status  New    Target Date  03/01/18      Additional Long Term Goals   Additional Long Term Goals  Yes      PT LONG TERM GOAL #6   Title  Pt will decrease TUG to 13  seconds or less in order to demonstrate decreased improved function and decreased fall risk     Baseline  12/07/17: 31.3s; scored 17 seconds on 01/08/18    Time  8    Period  Weeks    Status  New    Target Date  03/01/18            Plan - 01/08/18 1016    Clinical Impression Statement  Patient with negative canal testing bilaterally this date. Repeated functional outcome testing this date and patient has met 4/4 LTG and 2/2 STG as set on plan of care. Patient improved from 20/56 to 25/56 however, this still indicates patient is at risk for falls. Patient improved from 0.46 m/sec to 0.76 m/sec on the 10 meter walking speed test which is now within normative values as compared to age and gender norms. Patient improved on the Timed up and go test (TUG) with a decrease from 31.3 s to 17 seconds. Patient reports good compliance with home exercise program. Patient's Berg and TUG scores although have improved still indicate that patient is at risk for falls.  Patient has made good gains but she would benefit from continued PT services to further address updated goals and deficits as set on plan of care.     Rehab Potential  Fair    PT Frequency  2x / week    PT Duration  12 weeks    PT Treatment/Interventions  ADLs/Self Care Home Management;Aquatic Therapy;Canalith Repostioning;Cryotherapy;Electrical Stimulation;Iontophoresis '4mg'$ /ml Dexamethasone;Moist Heat;Ultrasound;DME Instruction;Gait training;Stair training;Functional mobility training;Therapeutic activities;Therapeutic exercise;Balance training;Neuromuscular re-education;Cognitive remediation;Patient/family education;Manual techniques;Passive range of motion;Dry needling;Vestibular    PT Next Visit Plan  Recheck Dix-Hallpike and roll test and treat as needed, progress strength, balance, and gait; Repeat functional outcome measures next visit    PT Home Exercise Plan  VOR x 1 horizontal and x 1 vertical (Pt was previously performing), sit to stand  without UE support (elevated surface as needed, rollator in front for safety), seated exercise program (marches, abduction, adduction, LAQ)    Consulted and Agree with Plan of Care  Patient       Patient will benefit from skilled therapeutic intervention in order to improve the following deficits and impairments:  Abnormal gait, Decreased balance, Difficulty walking, Dizziness, Decreased strength  Visit Diagnosis: Dizziness and giddiness  Difficulty in walking, not elsewhere classified  Muscle weakness (generalized)     Problem List Patient Active Problem List   Diagnosis Date Noted  . Carotid stenosis 12/28/2017  . Vertigo 11/30/2017  . Gait instability 10/19/2016  . Dizziness 11/03/2015  . Hypertension 12/04/2011  . Hyperlipidemia 12/04/2011  . Syncope 12/04/2011  . Diabetes mellitus ([place]) 12/04/2011  . Smoking hx 12/04/2011   Lady Deutscher PT, DPT [phone] Lady Deutscher 01/09/2018, 3:24 PM  [place] MAIN REHAB  [place], [place], [address] Phone: [phone]   Fax:  [phone]  Name: Claire Washington MRN: [id number] Date of Birth: 12/05/1924

## 2018-01-11 ENCOUNTER — Ambulatory Visit: Payer: Medicare Other | Admitting: Physical Therapy

## 2018-01-11 ENCOUNTER — Encounter: Payer: Self-pay | Admitting: Physical Therapy

## 2018-01-11 DIAGNOSIS — M6281 Muscle weakness (generalized): Secondary | ICD-10-CM

## 2018-01-11 DIAGNOSIS — R42 Dizziness and giddiness: Secondary | ICD-10-CM | POA: Diagnosis not present

## 2018-01-11 DIAGNOSIS — R262 Difficulty in walking, not elsewhere classified: Secondary | ICD-10-CM

## 2018-01-11 NOTE — Therapy (Signed)
[place] MAIN [facility] SERVICES [address] [place], [place], [address] Phone: [phone]   Fax:  [phone]  Physical Therapy Treatment  Patient Details  Name: Claire Washington MRN: [id number] Date of Birth: 05-15-1924 Referring Provider (PT): Dr. Pryor Ochoa   Encounter Date: 01/11/2018  PT End of Session - 01/11/18 1011    Visit Number  9    Number of Visits  25    Date for PT Re-Evaluation  03/01/18    Authorization Type  Progress Note: 9/10    Authorization Time Period  Last goals: 01/08/18    PT Start Time  1012    PT Stop Time  1046    PT Time Calculation (min)  34 min    Equipment Utilized During Treatment  Gait belt    Activity Tolerance  Patient tolerated treatment well    Behavior During Therapy  [facility] for tasks assessed/performed       Past Medical History:  Diagnosis Date  . Arthritis   . Basal cell carcinoma    basel cell  . Chronic cystitis   . Diabetes mellitus type 2, controlled, without complications ([place])    type 2 or unspecified type diabetes mellitus without mention of complication, not stated as uncontrolled  . Disease of thyroid gland   . Embolic stroke ([place])    a. 07/2016 MRI Brain: small right superior cerebellum and left lateral occipital cortex infarcts;  b. 08/2016 Echo: EF 60-65%, Gr1 DD, mild MR;  c. 08/2016 30 Day Event Monitor: No significant tachy/bradyarrhythmias. No afib.  . Follicular neoplasm of thyroid   . Hemorrhage of rectum and anus   . Hyperlipidemia   . Hyperlipidemia, unspecified   . Hypertension   . Leg pain   . Osteoarthritis   . Pernicious anemia   . Small vessel disease ([place])   . Spinal stenosis, lumbar region without neurogenic claudication   . Subdural hematoma ([place])    a. 2013 in setting of syncope.  . Syncope and collapse    a. 2013 - syncope and fall w/ resultant subdural hematoma; b. prev nl echo and stress test @ [facility].  Marland Kitchen Thyroid nodule   . Vertigo & Chronic  Dizziness    a. Previously seen by ENT with Physical Therapy.  MRI 2000 small vessel disease    Past Surgical History:  Procedure Laterality Date  . BILATERAL SALPINGOOPHORECTOMY    . COLONOSCOPY  01/26/2009   Diverticulosis  . CYSTOSCOPY    . HEMORRHOIDECTOMY BY SIMPLE LIGATION    . PR COLONOSCOPY FLX W/ENDOSCOPIC MUCOSAL RESECTION  09/02/2014   Procedure: COLONOSCOPY, FLEXIBLE; WITH ENDOSCOPIC MUCOSAL RESECTION; Surgeon: Saunders Revel, MD; [facility]; Service: Gastroenterology  . PR COLSC FLX W/RMVL OF TUMOR POLYP LESION SNARE TQ  09/02/2014   Procedure: COLONOSCOPY FLEX; W/REMOV TUMOR/LES BY SNARE; Surgeon: Saunders Revel, MD; Location: [facility]; Service: Gastroenterology  . SKIN SURGERY    . TOTAL ABDOMINAL HYSTERECTOMY W/ BILATERAL SALPINGOOPHORECTOMY  1970s    There were no vitals filed for this visit.  Subjective Assessment - 01/11/18 1011    Subjective  Patient reports everything was fine at her eye MD and vascular appointments this week. Patient states she has not had any episodes of spinning since last visit. Patient states it has been a tiring week with all of her appointments. Patient states she sees her cardiologist next week.     Pertinent History   Pt was admitted to  [place] for vertigo and discharged 8/17. She had an MRI during admission to rule out CVA which did not show any acute intracranial abnormality. Pt does have a history of R superior cerebellar and left lateral occipital infarcts from 2018. During admission she was seen by this therapist and had a positive R Dix-Hallpike and R roll test for very vigorous horizontal geotrophic nystagmus with 10/10 severity of vertigo. L roll test also confirmed positive horizontal canal testing with less severe vertigo and expected geoptrophic horizontal nystagmus.  Pt treated by therapist for possible R horizontal canal canalithiasis BPPV with Lempert roll x 2. Retesting is negative for  any further symptoms however balance remains notably impaired. Pt was discharged back to her facility and had a follow-up appointment with Dr. Pryor Ochoa at [facility]. She returned for follow-up with physical therapy with an order for ENT for vestibular therapy. 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 At the time of PT evaluation pt denies any further episodes of vertigo since discharge from [facility]. She does report persistent imbalance and has been using her wheelchair for mobility due to fear of falling. She has not had any falls since discharge. Pt reports any other significant changes in her health. She is known to this clinic having been treated for BPPV in the past.    Limitations  Walking    Diagnostic tests  MRI at [facility] showed no acute changes. Chronic R superior cerebellar and L occipital infarct    Patient Stated Goals  Improve strength, balance, and dizziness       Neuromuscular Re-education: Step Taps: On firm surface, patient performed alternating step taps on 6' wooden step with faded UEs support going from 2 hands to 1 hand support. Patient able to perform 10 reps with one hand support.   Balance Pod tapping: On firm surface, patient performed foot tapping to balance pods in series of one and two targets as called out by therapist. Patient occasionally touching // bar or leaning for support due to losses of balance.Patient requiring assistance ranging from CGA to Min A at times due to loss of balance.  Patient standing on green foam oval pad and worked on alternating feet tapping to balance pods placed laterally as this was the position that was the most challenging for the patient. Patient required CGA A and reached several times for // bar for support.   Step Ups: Patient performed step up and backward return on 6" wooden step with one hand upper extremity support 10 reps with CGA.   Patient performed side  stepping 4 reps each way length of // bar without UE support with CGA.    Repeated left and right Dix-Hallpike tests on inverted mat table and left and right horizontal canal tests this date and all were negative with no nystagmus observed and patient denied vertigo.     PT Education - 01/11/18 1011    Person(s) Educated  Patient    Methods  Explanation    Comprehension  Verbalized understanding       PT Short Term Goals - 01/08/18 1059      PT SHORT TERM GOAL #1   Title  Pt will be independent with HEP in order to improve strength and balance in order to decrease fall risk and improve function at home and work.     Time  6    Period  Weeks    Status  Achieved  PT SHORT TERM GOAL #2   Title  Pt will report no further episodes of vertigo in order to ensure safety when transitioning in and out of bed and when bending over while performing household responsibilities.    Baseline  12/07/17: No vertigo since discharge from [facility] 12/01/17; no episodes of vertigo    Time  6    Period  Weeks    Status  Achieved        PT Long Term Goals - 01/08/18 1100      PT LONG TERM GOAL #1   Title  Pt will increase self-selected 10MWT by at least 0.13 m/s in order to demonstrate clinically significant improvement in community ambulation.      Baseline  12/07/17 21.7 self-selected=0.46 m/s, 19.9s fastest=0.50 m/s;    Time  12    Period  Weeks    Status  Achieved      PT LONG TERM GOAL #2   Title  Pt will improve BERG by at least 3 points in order to demonstrate clinically significant improvement in balance.      Baseline  12/07/17: 20/56; scored 25/56 on 01/08/18    Time  12    Period  Weeks    Status  Achieved      PT LONG TERM GOAL #3   Title  Pt will decrease TUG by at least 23% (7.2s/at or below 24.1s) in order to demonstrate decreased improved function and decreased fall risk     Baseline  12/07/17: 31.3s; scored 17 seconds on 01/08/18    Time  12    Period  Weeks    Status  Achieved       PT LONG TERM GOAL #4   Title  Pt will be able to perform a sit to stand from a regular height chair without UE support in order to improve functional independence at home with transfers.     Baseline  12/07/17: unable to perform sit to stand without heavy UE support    Time  12    Period  Weeks    Status  Achieved      PT LONG TERM GOAL #5   Title  Pt will improve BERG by at least 3 points in order to demonstrate clinically significant improvement in balance.      Baseline  12/07/17: 20/56; scored 25/56 on 01/08/18    Time  8    Period  Weeks    Status  New    Target Date  03/01/18      Additional Long Term Goals   Additional Long Term Goals  Yes      PT LONG TERM GOAL #6   Title  Pt will decrease TUG to 13 seconds or less in order to demonstrate decreased improved function and decreased fall risk     Baseline  12/07/17: 31.3s; scored 17 seconds on 01/08/18    Time  8    Period  Weeks    Status  New    Target Date  03/01/18            Plan - 01/11/18 1012    Clinical Impression Statement  Repeated left and right canal testing this date and all were negative with no nystagmus observed and patient denied vertigo. Patient reports she is still having dizziness but no longer is having vertigo. Worked on balance activities and patient continues to have difficulty with single leg stance, narrow base of support, lower extremity motor control and uneven surface  activities. Patient reports that she is not able to climb stairs without using her hands to pull herself up. Plan to work on mini-squats and slow marching next session. Patient would benefit from continued PT services to further address goals and functional deficits.     Rehab Potential  Fair    PT Frequency  2x / week    PT Duration  12 weeks    PT Treatment/Interventions  ADLs/Self Care Home Management;Aquatic Therapy;Canalith Repostioning;Cryotherapy;Electrical Stimulation;Iontophoresis 4mg /ml Dexamethasone;Moist  Heat;Ultrasound;DME Instruction;Gait training;Stair training;Functional mobility training;Therapeutic activities;Therapeutic exercise;Balance training;Neuromuscular re-education;Cognitive remediation;Patient/family education;Manual techniques;Passive range of motion;Dry needling;Vestibular    PT Next Visit Plan  progress strength, balance, and gait; try mini squats next session and slow marching next visit    PT Home Exercise Plan  VOR x 1 horizontal and x 1 vertical (Pt was previously performing), sit to stand without UE support (elevated surface as needed, rollator in front for safety), seated exercise program (marches, abduction, adduction, LAQ)    Consulted and Agree with Plan of Care  Patient       Patient will benefit from skilled therapeutic intervention in order to improve the following deficits and impairments:  Abnormal gait, Decreased balance, Difficulty walking, Dizziness, Decreased strength  Visit Diagnosis: Dizziness and giddiness  Difficulty in walking, not elsewhere classified  Muscle weakness (generalized)     Problem List Patient Active Problem List   Diagnosis Date Noted  . Carotid stenosis 12/28/2017  . Vertigo 11/30/2017  . Gait instability 10/19/2016  . Dizziness 11/03/2015  . Hypertension 12/04/2011  . Hyperlipidemia 12/04/2011  . Syncope 12/04/2011  . Diabetes mellitus ([place]) 12/04/2011  . Smoking hx 12/04/2011   Lady Deutscher PT, DPT [phone] Lady Deutscher 01/11/2018, 11:38 AM  [place] MAIN [facility] SERVICES [address] [place], [place], [address] Phone: [phone]   Fax:  [phone]  Name: Yocheved Depner MRN: [id number] Date of Birth: 03/11/1925

## 2018-01-13 NOTE — Progress Notes (Signed)
Cardiology Office Note  Date:  01/14/2018   ID:  Claire Washington, DOB [birth year], MRN [id number]  PCP:  Tracie Harrier, MD   Chief Complaint  Patient presents with  . other    12 month follow up. Pt. c/o syncope and dizziness. Meds reviewed by the pt. verbally.     HPI:  Claire Washington is a very pleasant 84 year-old woman with history of   hypertension, chronic dizziness, benign positional vertigo,  osteoarthritis,  hyperlipidemia,  carotid arterial disease (followed by Dr. Lucky Cowboy, 50% on there right),  chronic cystitis,  diabetes,  Long smoking history until age 76 Previous TIAs per the patient, CVA 2018 Chronic  dizziness in her head, gait instability, for years, progressive who presents for routine followup of her dizziness, hypertension, TIA/CVAs  In the ER 12/01/2017 [facility] records reviewed with the patient in detail  In the ER 11/22/2017 [facility] reviewed with the patient in detail  Dizzy and weak, worse recently Seen by neurology  MRI brain without contrast done on 11/30/2017 which showed moderate atrophy and moderate microvascular ischemic changes.  old cerebellar stroke, microvascular ischemic changes, and inner ear issues: worsened.  Previous testing discussed with her in detail Echo 08/2016 Normal LV function No significant valve disease No indication from the echo as to cause of dizziness  Event monitor 08/2016 Normal sinus rhythm Rare PVCs, <1% No other significant arrhythmia noted Symptoms of dizziness did not seem to correlate to underlying arrhythmia  Chronic equilibrium and dizziness dating back several years, worse recently Difficult time with equilibrium.  Working with PT  EKG personally reviewed by myself on todays visit Shows sinus bradycardia rate 55 bpm no significant ST or T wave changes  Orthostatics done in the office were negative blood pressures 138 over 60s with no change on position heart rates 50s to  60s  Other past medical history reviewed MRI of the head performed showing Atrophy and small vessel disease. Small foci of restricted diffusion in the RIGHT superior cerebellum and LEFT lateral occipital cortex, consistent with nonhemorrhagic embolic infarcts.  Previously reported having episode of word confusion Resolved without intervention  Previous Total chol 211  TSH 8.3.   Episodes of syncope in the setting of GI issues/constipation, vasovagal episodes Sulfa allergy  History of swelling on high-dose amlodipine pneumonia November 2015.   syncope 08/25/2011. She was in her kitchen preparing dinner and she had acute loss of consciousness, hit her head, left temple on the refrigerator. She had a subarachnoid hemorrhage noted on evaluation at [facility], transferred to [facility] for further evaluation.   On her second episode, she reports having a cocktail, milk of magnesia with constipation. She was discharged to rehabilitation. While at rehabilitation, she was sitting on a bathroom commode, with constipation, had taken senna, she had lightheadedness and passed out for a brief period.   Previous Holter monitor and stress test several years ago at [place].   PMH:   has a past medical history of Arthritis, Basal cell carcinoma, Chronic cystitis, Diabetes mellitus type 2, controlled, without complications ([place]), Disease of thyroid gland, Embolic stroke ([place]), Follicular neoplasm of thyroid, Hemorrhage of rectum and anus, Hyperlipidemia, Hyperlipidemia, unspecified, Hypertension, Leg pain, Osteoarthritis, Pernicious anemia, Small vessel disease ([place]), Spinal stenosis, lumbar region without neurogenic claudication, Subdural hematoma ([place]), Syncope and collapse, Thyroid nodule, and Vertigo & Chronic Dizziness.  PSH:    Past Surgical History:  Procedure Laterality Date  . BILATERAL SALPINGOOPHORECTOMY    . COLONOSCOPY  01/26/2009  Diverticulosis  . CYSTOSCOPY    . HEMORRHOIDECTOMY BY  SIMPLE LIGATION    . PR COLONOSCOPY FLX W/ENDOSCOPIC MUCOSAL RESECTION  09/02/2014   Procedure: COLONOSCOPY, FLEXIBLE; WITH ENDOSCOPIC MUCOSAL RESECTION; Surgeon: Saunders Revel, MD; Location: [facility]; Service: Gastroenterology  . PR COLSC FLX W/RMVL OF TUMOR POLYP LESION SNARE TQ  09/02/2014   Procedure: COLONOSCOPY FLEX; W/REMOV TUMOR/LES BY SNARE; Surgeon: Saunders Revel, MD; Location: [facility]; Service: Gastroenterology  . SKIN SURGERY    . TOTAL ABDOMINAL HYSTERECTOMY W/ BILATERAL SALPINGOOPHORECTOMY  1970s    Current Outpatient Medications  Medication Sig Dispense Refill  . amLODipine (NORVASC) 5 MG tablet Take 1 tablet (5 mg total) by mouth daily. 90 tablet 4  . atorvastatin (LIPITOR) 10 MG tablet Take 1 tablet (10 mg total) by mouth daily. (Patient taking differently: Take 10 mg by mouth at bedtime. ) 90 tablet 4  . clopidogrel (PLAVIX) 75 MG tablet TAKE 1 TABLET (75 MG TOTAL) BY MOUTH DAILY. 90 tablet 3  . diazepam (VALIUM) 2 MG tablet Take 1 tablet (2 mg total) by mouth 3 (three) times daily as needed (vertigo). 15 tablet 0  . metoprolol tartrate (LOPRESSOR) 25 MG tablet Take 0.5 tablets (12.5 mg total) by mouth 2 (two) times daily. 30 tablet 6  . Multiple Vitamins-Minerals (CENTRUM SILVER PO) Take 1 tablet by mouth daily. Reported on 06/11/2015     No current facility-administered medications for this visit.      Allergies:   Hydrocortisone; Sulfa antibiotics; and Tape   Social History:  The patient  reports that she quit smoking about 24 years ago. Her smoking use included cigarettes. She has a 5.00 pack-year smoking history. She has never used smokeless tobacco. She reports that she drinks about 2.0 standard drinks of alcohol per week. She reports that she does not use drugs.   Family History:   family history includes Asthma in her daughter; Colon cancer in her mother; Diabetes in her other; Heart attack in her brother and father; Heart  disease in her father; Prostate cancer in her father; Stroke in her father.    Review of Systems: Review of Systems  Constitutional: Negative.   Respiratory: Negative.   Cardiovascular: Negative.   Gastrointestinal: Negative.   Musculoskeletal: Negative.        Gait instability, chronic balance problem  Neurological: Positive for dizziness.  Psychiatric/Behavioral: Negative.   All other systems reviewed and are negative. No significant change in review of systems   PHYSICAL EXAM: VS:  BP (!) 145/62 (BP Location: Left Arm, Patient Position: Sitting, Cuff Size: Normal)   Pulse (!) 55   Ht 5\' 1"  (1.549 m)   Wt 118 lb 8 oz (53.8 kg)   BMI 22.39 kg/m  , BMI Body mass index is 22.39 kg/m. Constitutional:  oriented to person, place, and time. No distress. Walks with a walker HENT:  Head: Normocephalic and atraumatic.  Eyes:  no discharge. No scleral icterus.  Neck: Normal range of motion. Neck supple. No JVD present.  Cardiovascular: Normal rate, regular rhythm, normal heart sounds and intact distal pulses. Exam reveals no gallop and no friction rub. No edema No murmur heard. Pulmonary/Chest: Effort normal and breath sounds normal. No stridor. No respiratory distress.  no wheezes.  no rales.  no tenderness.  Abdominal: Soft.  no distension.  no tenderness.  Musculoskeletal: Normal range of motion.  no  tenderness or deformity.  Neurological:  normal muscle tone. Coordination normal. No atrophy Skin:  Skin is warm and dry. No rash noted. not diaphoretic.  Psychiatric:  normal mood and affect. behavior is normal. Thought content normal.    Recent Labs: 11/30/2017: BUN 16; Creatinine, Ser 0.48; Hemoglobin 14.9; Magnesium 1.9; Platelets 223; Potassium 4.0; Sodium 139    Lipid Panel No results found for: CHOL, HDL, LDLCALC, TRIG    Wt Readings from Last 3 Encounters:  01/14/18 118 lb 8 oz (53.8 kg)  12/28/17 120 lb (54.4 kg)  11/30/17 114 lb 12.8 oz (52.1 kg)     ASSESSMENT  AND PLAN:  Other secondary hypertension Long discussion concerning her medications She is interested in trying to come off the metoprolol to see if symptoms of dizziness will get better Recommend she stop the metoprolol Increase amlodipine up to 5 twice daily for 2 weeks as a test If symptoms get better would stay on the regimen If no better could stay on the regimen or go back to the way it was before with metoprolol  Dizziness Periods of lightheadedness, gait instability, confusion MRI showing  strokes Seen by neurology also with moderate microvascular disease No further cardiac work-up needed, medication changes as above   Stroke Multiple embolic strokes on MRI moderate carotid disease on the right followed by Dr. Lucky Cowboy Scheduled to see neurology at [facility] Stressed importance of Plavix statin  Smoking hx Currently a nonsmoker  Type 2 diabetes mellitus with other circulatory complication, without long-term current use of insulin ([place]) Unable to exercise secondary to gait instability Number stable  Mixed hyperlipidemia Tolerating Lipitor Often missing doses Previous total cholesterol 147 now up to 188 after missing doses Recommended she stay compliant take it daily  Acute confusion Denies any further episodes of confusion Previous MRI.  No further workup at this time  Periodic  weakness   Total encounter time more than 25 minutes  Greater than 50% was spent in counseling and coordination of care with the patient   Disposition:   F/U  12 months   Orders Placed This Encounter  Procedures  . EKG 12-Lead     Signed, Esmond Plants, M.D., Ph.D. 01/14/2018  [place], Emma \

## 2018-01-14 ENCOUNTER — Ambulatory Visit (INDEPENDENT_AMBULATORY_CARE_PROVIDER_SITE_OTHER): Payer: Medicare Other | Admitting: Cardiovascular Disease

## 2018-01-14 ENCOUNTER — Encounter: Payer: Self-pay | Admitting: Cardiovascular Disease

## 2018-01-14 VITALS — BP 145/62 | HR 55 | Ht 61.0 in | Wt 118.5 lb

## 2018-01-14 DIAGNOSIS — I6523 Occlusion and stenosis of bilateral carotid arteries: Secondary | ICD-10-CM | POA: Diagnosis not present

## 2018-01-14 DIAGNOSIS — R0989 Other specified symptoms and signs involving the circulatory and respiratory systems: Secondary | ICD-10-CM | POA: Diagnosis not present

## 2018-01-14 DIAGNOSIS — E1159 Type 2 diabetes mellitus with other circulatory complications: Secondary | ICD-10-CM | POA: Diagnosis not present

## 2018-01-14 DIAGNOSIS — R001 Bradycardia, unspecified: Secondary | ICD-10-CM

## 2018-01-14 DIAGNOSIS — I10 Essential (primary) hypertension: Secondary | ICD-10-CM

## 2018-01-14 DIAGNOSIS — E782 Mixed hyperlipidemia: Secondary | ICD-10-CM

## 2018-01-14 DIAGNOSIS — R42 Dizziness and giddiness: Secondary | ICD-10-CM

## 2018-01-14 DIAGNOSIS — Z87891 Personal history of nicotine dependence: Secondary | ICD-10-CM | POA: Diagnosis not present

## 2018-01-14 DIAGNOSIS — I639 Cerebral infarction, unspecified: Secondary | ICD-10-CM

## 2018-01-14 NOTE — Patient Instructions (Addendum)
Medication Instructions:   TEST RUN for 2 weeks OK to stop metoprolol Take amlodipine 5 mg twice a day  Labwork:  No new labs needed  Testing/Procedures:  No further testing at this time   Follow-Up: It was a pleasure seeing you in the office today. Please call us if you have new issues that need to be addressed before your next appt.  [phone]  Your physician wants you to follow-up in: 12 months.  You will receive a reminder letter in the mail two months in advance. If you don't receive a letter, please call our office to schedule the follow-up appointment.  If you need a refill on your cardiac medications before your next appointment, please call your pharmacy.  For educational health videos Log in to : [email or web address] Or : [email or web address], password : [email or web address]

## 2018-01-15 ENCOUNTER — Ambulatory Visit: Payer: Medicare Other | Attending: Otolaryngology | Admitting: Physical Therapy

## 2018-01-15 ENCOUNTER — Encounter: Payer: Self-pay | Admitting: Physical Therapy

## 2018-01-15 DIAGNOSIS — R42 Dizziness and giddiness: Secondary | ICD-10-CM | POA: Insufficient documentation

## 2018-01-15 DIAGNOSIS — R262 Difficulty in walking, not elsewhere classified: Secondary | ICD-10-CM | POA: Insufficient documentation

## 2018-01-15 DIAGNOSIS — M6281 Muscle weakness (generalized): Secondary | ICD-10-CM | POA: Insufficient documentation

## 2018-01-15 NOTE — Therapy (Signed)
[place] MAIN [facility] SERVICES [place], [place], [address] Phone: [phone]   Fax:  [phone]  Physical Therapy Treatment/Progress Note  Patient Details  Name: Claire Washington MRN: [id number] Date of Birth: December 19, 1924 Referring Provider (PT): Dr. Pryor Ochoa   Encounter Date: 01/15/2018  Patient has been seen for 10 visits from 12/10/17-01/15/18.  PT End of Session - 01/15/18 1017    Visit Number  10    Number of Visits  25    Date for PT Re-Evaluation  03/01/18    Authorization Type  Progress Note: 10/10    Authorization Time Period  Last goals: 01/08/18    PT Start Time  1016    PT Stop Time  1057    PT Time Calculation (min)  41 min    Equipment Utilized During Treatment  Gait belt    Activity Tolerance  Patient tolerated treatment well    Behavior During Therapy  WFL for tasks assessed/performed       Past Medical History:  Diagnosis Date  . Arthritis   . Basal cell carcinoma    basel cell  . Chronic cystitis   . Diabetes mellitus type 2, controlled, without complications ([place])    type 2 or unspecified type diabetes mellitus without mention of complication, not stated as uncontrolled  . Disease of thyroid gland   . Embolic stroke ([place])    a. 07/2016 MRI Brain: small right superior cerebellum and left lateral occipital cortex infarcts;  b. 08/2016 Echo: EF 60-65%, Gr1 DD, mild MR;  c. 08/2016 30 Day Event Monitor: No significant tachy/bradyarrhythmias. No afib.  . Follicular neoplasm of thyroid   . Hemorrhage of rectum and anus   . Hyperlipidemia   . Hyperlipidemia, unspecified   . Hypertension   . Leg pain   . Osteoarthritis   . Pernicious anemia   . Small vessel disease ([place])   . Spinal stenosis, lumbar region without neurogenic claudication   . Subdural hematoma ([place])    a. 2013 in setting of syncope.  . Syncope and collapse    a. 2013 - syncope and fall w/ resultant subdural hematoma; b. prev nl echo and  stress test @ [facility].  Marland Kitchen Thyroid nodule   . Vertigo & Chronic Dizziness    a. Previously seen by ENT with Physical Therapy.  MRI 2000 small vessel disease    Past Surgical History:  Procedure Laterality Date  . BILATERAL SALPINGOOPHORECTOMY    . COLONOSCOPY  01/26/2009   Diverticulosis  . CYSTOSCOPY    . HEMORRHOIDECTOMY BY SIMPLE LIGATION    . PR COLONOSCOPY FLX W/ENDOSCOPIC MUCOSAL RESECTION  09/02/2014   Procedure: COLONOSCOPY, FLEXIBLE; WITH ENDOSCOPIC MUCOSAL RESECTION; Surgeon: Saunders Revel, MD; Location: [facility]; Service: Gastroenterology  . PR COLSC FLX W/RMVL OF TUMOR POLYP LESION SNARE TQ  09/02/2014   Procedure: COLONOSCOPY FLEX; W/REMOV TUMOR/LES BY SNARE; Surgeon: Saunders Revel, MD; Location: [facility]; Service: Gastroenterology  . SKIN SURGERY    . TOTAL ABDOMINAL HYSTERECTOMY W/ BILATERAL SALPINGOOPHORECTOMY  1970s    There were no vitals filed for this visit.  Subjective Assessment - 01/15/18 1020    Subjective  Patient states she saw her cardiologist and that he recommended trying to stop the Metoproplol and increasing the Amlodipine up to 5- 2 x daily for 2 weeks to see if symptoms of dizziness get better. Patient states she has not started this yet and that she is going  to start the medication change on a day when she is home all day to see how it affects her. Patient states "they are not finding anything" in regards to her symptoms. Patient reports no other significant items discussed at her cardiology appointment.      Pertinent History   Pt was admitted to [facility] for vertigo and discharged 8/17. She had an MRI during admission to rule out CVA which did not show any acute intracranial abnormality. Pt does have a history of R superior cerebellar and left lateral occipital infarcts from 2018. During admission she was seen by this therapist and had a positive R Dix-Hallpike and R roll test for very vigorous horizontal  geotrophic nystagmus with 10/10 severity of vertigo. L roll test also confirmed positive horizontal canal testing with less severe vertigo and expected geoptrophic horizontal nystagmus.  Pt treated by therapist for possible R horizontal canal canalithiasis BPPV with Lempert roll x 2. Retesting is negative for any further symptoms however balance remains notably impaired. Pt was discharged back to her facility and had a follow-up appointment with Dr. Pryor Ochoa at [facility] ENT. She returned for follow-up with physical therapy with an order for ENT for vestibular therapy. 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 At the time of PT evaluation pt denies any further episodes of vertigo since discharge from [facility]. She does report persistent imbalance and has been using her wheelchair for mobility due to fear of falling. She has not had any falls since discharge. Pt reports any other significant changes in her health. She is known to this clinic having been treated for BPPV in the past.    Limitations  Walking    Diagnostic tests  MRI at [facility] showed no acute changes. Chronic R superior cerebellar and L occipital infarct    Patient Stated Goals  Improve strength, balance, and dizziness    Currently in Pain?  No/denies      Neuromuscular Re-education:  Mini-squats: Patient performed 2 sets of 15 reps mini-squats with 5 second holds with verbal cues to maintain upright posture. Patient reports that she feels her legs/ knee fatigue with this exercise.   Slow Marching: Patient performed on firm surface slow marching with 5 second holds with CGA. Patient performed exercise standing in corner with locked 3 wheel walker in front of her with verbal cues for technique. Patient performed 10 reps and attempted a second set of 10 reps but patient was able to perform 5 reps secondary to fatigue despite standing rest break between sets. Patient had a sitting  rest break.  Patient reaching to touch for support multiple times during this activity due to loss of balance.   Step Ups: Patient performed step ups on 6" wooden box 10 reps with few finger upper extremity support of one hand with CGA to Min A secondary to posterior losses of balance at times. Patient unable to perform without few finger support. Performed side stepping up onto 5" wooden box (as patient was not able to perform on 6" wooden box without mod use of both UEs), over and back down 5 reps each side.    PT Education - 01/15/18 1017    Education Details  educated as to [employer] and slow marching for HEP, discussed safety with HEP    Person(s) Educated  Patient    Methods  Explanation;Demonstration;Verbal cues;Handout    Comprehension  Verbalized understanding;Returned demonstration  PT Short Term Goals - 01/08/18 1059      PT SHORT TERM GOAL #1   Title  Pt will be independent with HEP in order to improve strength and balance in order to decrease fall risk and improve function at home and work.     Time  6    Period  Weeks    Status  Achieved      PT SHORT TERM GOAL #2   Title  Pt will report no further episodes of vertigo in order to ensure safety when transitioning in and out of bed and when bending over while performing household responsibilities.    Baseline  12/07/17: No vertigo since discharge from [facility] 12/01/17; no episodes of vertigo    Time  6    Period  Weeks    Status  Achieved        PT Long Term Goals - 01/08/18 1100      PT LONG TERM GOAL #1   Title  Pt will increase self-selected 10MWT by at least 0.13 m/s in order to demonstrate clinically significant improvement in community ambulation.      Baseline  12/07/17 21.7 self-selected=0.46 m/s, 19.9s fastest=0.50 m/s;    Time  12    Period  Weeks    Status  Achieved      PT LONG TERM GOAL #2   Title  Pt will improve BERG by at least 3 points in order to demonstrate clinically significant improvement  in balance.      Baseline  12/07/17: 20/56; scored 25/56 on 01/08/18    Time  12    Period  Weeks    Status  Achieved      PT LONG TERM GOAL #3   Title  Pt will decrease TUG by at least 23% (7.2s/at or below 24.1s) in order to demonstrate decreased improved function and decreased fall risk     Baseline  12/07/17: 31.3s; scored 17 seconds on 01/08/18    Time  12    Period  Weeks    Status  Achieved      PT LONG TERM GOAL #4   Title  Pt will be able to perform a sit to stand from a regular height chair without UE support in order to improve functional independence at home with transfers.     Baseline  12/07/17: unable to perform sit to stand without heavy UE support    Time  12    Period  Weeks    Status  Achieved      PT LONG TERM GOAL #5   Title  Pt will improve BERG by at least 3 points in order to demonstrate clinically significant improvement in balance.      Baseline  12/07/17: 20/56; scored 25/56 on 01/08/18    Time  8    Period  Weeks    Status  New    Target Date  03/01/18      Additional Long Term Goals   Additional Long Term Goals  Yes      PT LONG TERM GOAL #6   Title  Pt will decrease TUG to 13 seconds or less in order to demonstrate decreased improved function and decreased fall risk     Baseline  12/07/17: 31.3s; scored 17 seconds on 01/08/18    Time  8    Period  Weeks    Status  New    Target Date  03/01/18  Plan - 01/15/18 1022    Clinical Impression Statement  Performed repeat functional outcome measures on 01/08/18 and at that time patient had improved from 20/56 to 25/56 however, this still indicates patient is at risk for falls. Patient improved from 0.46 m/sec to 0.76 m/sec on the 10 meter walking speed test which is now within normative values as compared to age and gender norms. Patient improved on the Timed up and go test (TUG) with a decrease from 31.3 s to 17 seconds. Patient reports good compliance with home exercise program. Patient's Berg  and TUG scores although had improved still indicate that patient is at risk for falls.  Patient has made good gains but she would benefit from continued PT services to further address updated goals and deficits as set on plan of care. Patient worked on several exercises this date that focused on strengthening and balance and these are exercises that were added to her home exercise program.     Rehab Potential  Fair    PT Frequency  2x / week    PT Duration  12 weeks    PT Treatment/Interventions  ADLs/Self Care Home Management;Aquatic Therapy;Canalith Repostioning;Cryotherapy;Electrical Stimulation;Iontophoresis 4mg /ml Dexamethasone;Moist Heat;Ultrasound;DME Instruction;Gait training;Stair training;Functional mobility training;Therapeutic activities;Therapeutic exercise;Balance training;Neuromuscular re-education;Cognitive remediation;Patient/family education;Manual techniques;Passive range of motion;Dry needling;Vestibular    PT Next Visit Plan  progress strength, balance, and gait; review mini squats and slow marching next visit    PT Home Exercise Plan  VOR x 1 horizontal and x 1 vertical (Pt was previously performing), sit to stand without UE support (elevated surface as needed, rollator in front for safety), seated exercise program (marches, abduction, adduction, LAQ), standing mini squats and slow marching    Consulted and Agree with Plan of Care  Patient       Patient will benefit from skilled therapeutic intervention in order to improve the following deficits and impairments:  Abnormal gait, Decreased balance, Difficulty walking, Dizziness, Decreased strength  Visit Diagnosis: Dizziness and giddiness  Difficulty in walking, not elsewhere classified  Muscle weakness (generalized)     Problem List Patient Active Problem List   Diagnosis Date Noted  . Carotid stenosis 12/28/2017  . Vertigo 11/30/2017  . Gait instability 10/19/2016  . Dizziness 11/03/2015  . Hypertension 12/04/2011   . Hyperlipidemia 12/04/2011  . Syncope 12/04/2011  . Diabetes mellitus ([place]) 12/04/2011  . Smoking hx 12/04/2011   Lady Deutscher PT, DPT [phone] Lady Deutscher 01/15/2018, 3:33 PM  [place] MAIN [facility] SERVICES [address] [place], [place], [address] Phone: [phone]   Fax:  [phone]  Name: Aloria Looper MRN: [id number] Date of Birth: [birth year]

## 2018-01-18 ENCOUNTER — Ambulatory Visit: Payer: Medicare Other | Admitting: Physical Therapy

## 2018-01-18 ENCOUNTER — Encounter: Payer: Self-pay | Admitting: Physical Therapy

## 2018-01-18 DIAGNOSIS — R42 Dizziness and giddiness: Secondary | ICD-10-CM

## 2018-01-18 DIAGNOSIS — R262 Difficulty in walking, not elsewhere classified: Secondary | ICD-10-CM

## 2018-01-18 DIAGNOSIS — M6281 Muscle weakness (generalized): Secondary | ICD-10-CM

## 2018-01-18 NOTE — Therapy (Signed)
[place] MAIN [facility] SERVICES [address] [place], [place], [address] Phone: [phone]   Fax:  [phone]  Physical Therapy Treatment  Patient Details  Name: Claire Washington MRN: [id number] Date of Birth: [birth year] Referring Provider (PT): Dr. Pryor Ochoa   Encounter Date: 01/18/2018  PT End of Session - 01/18/18 1021    Visit Number  11    Number of Visits  25    Date for PT Re-Evaluation  03/01/18    Authorization Type  Progress Note: 11/20    Authorization Time Period  Last goals: 01/08/18    PT Start Time  1018    PT Stop Time  1103    PT Time Calculation (min)  45 min    Equipment Utilized During Treatment  Gait belt    Activity Tolerance  Patient tolerated treatment well    Behavior During Therapy  [facility] for tasks assessed/performed       Past Medical History:  Diagnosis Date  . Arthritis   . Basal cell carcinoma    basel cell  . Chronic cystitis   . Diabetes mellitus type 2, controlled, without complications ([place])    type 2 or unspecified type diabetes mellitus without mention of complication, not stated as uncontrolled  . Disease of thyroid gland   . Embolic stroke ([place])    a. 07/2016 MRI Brain: small right superior cerebellum and left lateral occipital cortex infarcts;  b. 08/2016 Echo: EF 60-65%, Gr1 DD, mild MR;  c. 08/2016 30 Day Event Monitor: No significant tachy/bradyarrhythmias. No afib.  . Follicular neoplasm of thyroid   . Hemorrhage of rectum and anus   . Hyperlipidemia   . Hyperlipidemia, unspecified   . Hypertension   . Leg pain   . Osteoarthritis   . Pernicious anemia   . Small vessel disease ([place])   . Spinal stenosis, lumbar region without neurogenic claudication   . Subdural hematoma ([place])    a. 2013 in setting of syncope.  . Syncope and collapse    a. 2013 - syncope and fall w/ resultant subdural hematoma; b. prev nl echo and stress test @ [facility].  Marland Kitchen Thyroid nodule   . Vertigo & Chronic  Dizziness    a. Previously seen by ENT with Physical Therapy.  MRI 2000 small vessel disease    Past Surgical History:  Procedure Laterality Date  . BILATERAL SALPINGOOPHORECTOMY    . COLONOSCOPY  01/26/2009   Diverticulosis  . CYSTOSCOPY    . HEMORRHOIDECTOMY BY SIMPLE LIGATION    . PR COLONOSCOPY FLX W/ENDOSCOPIC MUCOSAL RESECTION  09/02/2014   Procedure: COLONOSCOPY, FLEXIBLE; WITH ENDOSCOPIC MUCOSAL RESECTION; Surgeon: Saunders Revel, MD; Location: [facility]; Service: Gastroenterology  . PR COLSC FLX W/RMVL OF TUMOR POLYP LESION SNARE TQ  09/02/2014   Procedure: COLONOSCOPY FLEX; W/REMOV TUMOR/LES BY SNARE; Surgeon: Saunders Revel, MD; Location: [facility]; Service: Gastroenterology  . SKIN SURGERY    . TOTAL ABDOMINAL HYSTERECTOMY W/ BILATERAL SALPINGOOPHORECTOMY  1970s    There were no vitals filed for this visit.  Subjective Assessment - 01/18/18 1021    Subjective  Patient states that she has not changed her blood pressure medication yet as per her cardiologist's recommendation but states that she is planning on starting that next week as she wants to be home. Patient reports she has a home blood pressure monitor. Encouraged patient to make sure to take her blood pressure reading frequently after she changes her medication  and to stay hydrated.     Pertinent History   Pt was admitted to [facility] for vertigo and discharged 8/17. She had an MRI during admission to rule out CVA which did not show any acute intracranial abnormality. Pt does have a history of R superior cerebellar and left lateral occipital infarcts from 2018. During admission she was seen by this therapist and had a positive R Dix-Hallpike and R roll test for very vigorous horizontal geotrophic nystagmus with 10/10 severity of vertigo. L roll test also confirmed positive horizontal canal testing with less severe vertigo and expected geoptrophic horizontal nystagmus.  Pt treated by therapist  for possible R horizontal canal canalithiasis BPPV with Lempert roll x 2. Retesting is negative for any further symptoms however balance remains notably impaired. Pt was discharged back to her facility and had a follow-up appointment with Dr. Pryor Ochoa at [facility] E[facility]. She returned for follow-up with physical therapy with an order for ENT for vestibular therapy. 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 At the time of PT evaluation pt denies any further episodes of vertigo since discharge from [facility]. She does report persistent imbalance and has been using her wheelchair for mobility due to fear of falling. She has not had any falls since discharge. Pt reports any other significant changes in her health. She is known to this clinic having been treated for BPPV in the past.    Limitations  Walking    Diagnostic tests  MRI at [facility] showed no acute changes. Chronic R superior cerebellar and L occipital infarct    Patient Stated Goals  Improve strength, balance, and dizziness    Currently in Pain?  --   none stated      Therapeutic Exercise:  Mini-squats: Patient performed 15 reps mini-squats with 5 second holds with verbal cues to maintain upright posture initially.  Slow Marching: Patient performed on firm surface slow marching with CGA with use of a few fingers of one hand on the // bar for support. Patient performed 2 sets of 10 reps.  Patient reports fatigue in right knee with activity.   Standing Hip:  Patient performed in standing with 2# ankle weights 2 sets of 10 reps of hip flexion, hip ABDuction, and hip extension with one hand support on // bar.  Patient required occasional verbal cue for technique but overall was able to maintain good posture during exercise.   Neuromuscular Re-education:  Obstacle Course: Patient performed obstacle course multiple reps of sidestepping and then forward walking which included the  following activities: navigating over balance pods, cones, green oval foam and stepping onto and over foam pads A[employer]x pad. Patient required faded use of one UEs progressing to pressure of 3 fingers to perform. Patient required one seated rest break.  Patient needed to slow down to adjust to be able to step over higher objects and paused after stepping off of foam surfaces to regain her balance. Patient required CGA for safety.     PT Education - 01/18/18 1021    Education Details  reviewed mini squats and slow marching; discussed transition to home exercise program and community gyms/classes    Person(s) Educated  Patient    Methods  Explanation    Comprehension  Verbalized understanding       PT Short Term Goals - 01/08/18 1059      PT SHORT TERM GOAL #1   Title  Pt will be independent with  HEP in order to improve strength and balance in order to decrease fall risk and improve function at home and work.     Time  6    Period  Weeks    Status  Achieved      PT SHORT TERM GOAL #2   Title  Pt will report no further episodes of vertigo in order to ensure safety when transitioning in and out of bed and when bending over while performing household responsibilities.    Baseline  12/07/17: No vertigo since discharge from [facility] 12/01/17; no episodes of vertigo    Time  6    Period  Weeks    Status  Achieved        PT Long Term Goals - 01/08/18 1100      PT LONG TERM GOAL #1   Title  Pt will increase self-selected 10MWT by at least 0.13 m/s in order to demonstrate clinically significant improvement in community ambulation.      Baseline  12/07/17 21.7 self-selected=0.46 m/s, 19.9s fastest=0.50 m/s;    Time  12    Period  Weeks    Status  Achieved      PT LONG TERM GOAL #2   Title  Pt will improve BERG by at least 3 points in order to demonstrate clinically significant improvement in balance.      Baseline  12/07/17: 20/56; scored 25/56 on 01/08/18    Time  12    Period  Weeks     Status  Achieved      PT LONG TERM GOAL #3   Title  Pt will decrease TUG by at least 23% (7.2s/at or below 24.1s) in order to demonstrate decreased improved function and decreased fall risk     Baseline  12/07/17: 31.3s; scored 17 seconds on 01/08/18    Time  12    Period  Weeks    Status  Achieved      PT LONG TERM GOAL #4   Title  Pt will be able to perform a sit to stand from a regular height chair without UE support in order to improve functional independence at home with transfers.     Baseline  12/07/17: unable to perform sit to stand without heavy UE support    Time  12    Period  Weeks    Status  Achieved      PT LONG TERM GOAL #5   Title  Pt will improve BERG by at least 3 points in order to demonstrate clinically significant improvement in balance.      Baseline  12/07/17: 20/56; scored 25/56 on 01/08/18    Time  8    Period  Weeks    Status  New    Target Date  03/01/18      Additional Long Term Goals   Additional Long Term Goals  Yes      PT LONG TERM GOAL #6   Title  Pt will decrease TUG to 13 seconds or less in order to demonstrate decreased improved function and decreased fall risk     Baseline  12/07/17: 31.3s; scored 17 seconds on 01/08/18    Time  8    Period  Weeks    Status  New    Target Date  03/01/18            Plan - 01/18/18 1022    Clinical Impression Statement  Reviewed mini-squats and slow marching that were added to the home exercise program  and patient required only 1 cue for posture. Patient doing well with strengthening exercises but continues to be challenged by balance activites. Patient improved with practice obstacle course balance activities and she was better able to maintain her balance when attempting to sidestep or forward step over objects and she improved with being able to step onto and off foam surfaces without overt loss of balance. Patient would benefit from continued PT services to address goals.     Rehab Potential  Fair    PT  Frequency  2x / week    PT Duration  12 weeks    PT Treatment/Interventions  ADLs/Self Care Home Management;Aquatic Therapy;Canalith Repostioning;Cryotherapy;Electrical Stimulation;Iontophoresis 4mg /ml Dexamethasone;Moist Heat;Ultrasound;DME Instruction;Gait training;Stair training;Functional mobility training;Therapeutic activities;Therapeutic exercise;Balance training;Neuromuscular re-education;Cognitive remediation;Patient/family education;Manual techniques;Passive range of motion;Dry needling;Vestibular    PT Next Visit Plan  progress strength, balance, and gait; review 3 way hip and discuss discharge planning    PT Home Exercise Plan  VOR x 1 horizontal and x 1 vertical (Pt was previously performing), sit to stand without UE support (elevated surface as needed, rollator in front for safety), seated exercise program (marches, abduction, adduction, LAQ), standing mini squats and slow marching    Consulted and Agree with Plan of Care  Patient       Patient will benefit from skilled therapeutic intervention in order to improve the following deficits and impairments:  Abnormal gait, Decreased balance, Difficulty walking, Dizziness, Decreased strength  Visit Diagnosis: Dizziness and giddiness  Difficulty in walking, not elsewhere classified  Muscle weakness (generalized)     Problem List Patient Active Problem List   Diagnosis Date Noted  . Carotid stenosis 12/28/2017  . Vertigo 11/30/2017  . Gait instability 10/19/2016  . Dizziness 11/03/2015  . Hypertension 12/04/2011  . Hyperlipidemia 12/04/2011  . Syncope 12/04/2011  . Diabetes mellitus ([place]) 12/04/2011  . Smoking hx 12/04/2011   Lady Deutscher PT, DPT [phone] Lady Deutscher 01/18/2018, 12:53 PM  [place] MAIN [facility] SERVICES [address] [place], [place], [address] Phone: [phone]   Fax:  [phone]  Name: Claire Washington MRN: [id number] Date of Birth: Oct 16, 1924

## 2018-01-21 ENCOUNTER — Ambulatory Visit: Payer: Medicare Other | Admitting: Physical Therapy

## 2018-01-21 ENCOUNTER — Encounter: Payer: Self-pay | Admitting: Physical Therapy

## 2018-01-21 DIAGNOSIS — M6281 Muscle weakness (generalized): Secondary | ICD-10-CM

## 2018-01-21 DIAGNOSIS — R42 Dizziness and giddiness: Secondary | ICD-10-CM

## 2018-01-21 DIAGNOSIS — R262 Difficulty in walking, not elsewhere classified: Secondary | ICD-10-CM

## 2018-01-21 NOTE — Therapy (Signed)
[place] MAIN [facility] SERVICES [address] [place], [place], [address] Phone: [phone]   Fax:  [phone]  Physical Therapy Treatment/Discharge Summary  Patient Details  Name: Claire Washington MRN: [id number] Date of Birth: [birth year] Referring Provider (PT): Dr. Pryor Ochoa   Encounter Date: 01/21/2018  Visits from Start of Care: 12   PT End of Session - 01/21/18 1125    Visit Number  12    Number of Visits  25    Date for PT Re-Evaluation  03/01/18    Authorization Type  Progress Note: 12/20    Authorization Time Period  Last goals: 01/08/18    PT Start Time  1120    PT Stop Time  1156    PT Time Calculation (min)  36 min    Equipment Utilized During Treatment  Gait belt    Activity Tolerance  Patient tolerated treatment well    Behavior During Therapy  [facility] for tasks assessed/performed       Past Medical History:  Diagnosis Date  . Arthritis   . Basal cell carcinoma    basel cell  . Chronic cystitis   . Diabetes mellitus type 2, controlled, without complications ([place])    type 2 or unspecified type diabetes mellitus without mention of complication, not stated as uncontrolled  . Disease of thyroid gland   . Embolic stroke ([place])    a. 07/2016 MRI Brain: small right superior cerebellum and left lateral occipital cortex infarcts;  b. 08/2016 Echo: EF 60-65%, Gr1 DD, mild MR;  c. 08/2016 30 Day Event Monitor: No significant tachy/bradyarrhythmias. No afib.  . Follicular neoplasm of thyroid   . Hemorrhage of rectum and anus   . Hyperlipidemia   . Hyperlipidemia, unspecified   . Hypertension   . Leg pain   . Osteoarthritis   . Pernicious anemia   . Small vessel disease ([place])   . Spinal stenosis, lumbar region without neurogenic claudication   . Subdural hematoma ([place])    a. 2013 in setting of syncope.  . Syncope and collapse    a. 2013 - syncope and fall w/ resultant subdural hematoma; b. prev nl echo and stress test @ [facility].  Marland Kitchen Thyroid nodule   . Vertigo & Chronic Dizziness    a. Previously seen by ENT with Physical Therapy.  MRI 2000 small vessel disease    Past Surgical History:  Procedure Laterality Date  . BILATERAL SALPINGOOPHORECTOMY    . COLONOSCOPY  01/26/2009   Diverticulosis  . CYSTOSCOPY    . HEMORRHOIDECTOMY BY SIMPLE LIGATION    . PR COLONOSCOPY FLX W/ENDOSCOPIC MUCOSAL RESECTION  09/02/2014   Procedure: COLONOSCOPY, FLEXIBLE; WITH ENDOSCOPIC MUCOSAL RESECTION; Surgeon: Saunders Revel, MD; Location: [facility]; Service: Gastroenterology  . PR COLSC FLX W/RMVL OF TUMOR POLYP LESION SNARE TQ  09/02/2014   Procedure: COLONOSCOPY FLEX; W/REMOV TUMOR/LES BY SNARE; Surgeon: Saunders Revel, MD; Location: [facility]; Service: Gastroenterology  . SKIN SURGERY    . TOTAL ABDOMINAL HYSTERECTOMY W/ BILATERAL SALPINGOOPHORECTOMY  1970s    There were no vitals filed for this visit.  Subjective Assessment - 01/21/18 1210    Subjective  Patient states that she feels she has made improvements since starting therapy. Patient states she is comfortable with her home exercise program and does not have any questions.    Pertinent History   Pt was admitted to [facility] for vertigo and discharged 8/17. She had an MRI during admission  to rule out CVA which did not show any acute intracranial abnormality. Pt does have a history of R superior cerebellar and left lateral occipital infarcts from 2018. During admission she was seen by this therapist and had a positive R Dix-Hallpike and R roll test for very vigorous horizontal geotrophic nystagmus with 10/10 severity of vertigo. L roll test also confirmed positive horizontal canal testing with less severe vertigo and expected geoptrophic horizontal nystagmus.  Pt treated by therapist for possible R horizontal canal canalithiasis BPPV with Lempert roll x 2. Retesting is negative for any further symptoms however balance remains notably  impaired. Pt was discharged back to her facility and had a follow-up appointment with Dr. Pryor Ochoa at [facility] E[facility]. She returned for follow-up with physical therapy with an order for ENT for vestibular therapy. 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 At the time of PT evaluation pt denies any further episodes of vertigo since discharge from [facility]. She does report persistent imbalance and has been using her wheelchair for mobility due to fear of falling. She has not had any falls since discharge. Pt reports any other significant changes in her health. She is known to this clinic having been treated for BPPV in the past.    Limitations  Walking    Diagnostic tests  MRI at [facility] showed no acute changes. Chronic R superior cerebellar and L occipital infarct    Patient Stated Goals  Improve strength, balance, and dizziness    Currently in Pain?  --   none stated        [facility] PT Assessment - 01/21/18 1216      Berg Balance Test   Sit to Stand  Able to stand without using hands and stabilize independently    Standing Unsupported  Able to stand safely 2 minutes    Sitting with Back Unsupported but Feet Supported on Floor or Stool  Able to sit safely and securely 2 minutes    Stand to Sit  Sits safely with minimal use of hands    Transfers  Able to transfer safely, minor use of hands    Standing Unsupported with Eyes Closed  Needs help to keep from falling    Standing Ubsupported with Feet Together  Needs help to attain position and unable to hold for 15 seconds    From Standing, Reach Forward with Outstretched Arm  Can reach forward >12 cm safely (5")   6.25 inches   From Standing Position, Pick up Object from Floor  Unable to pick up and needs supervision    From Standing Position, Turn to Look Behind Over each Shoulder  Looks behind from both sides and weight shifts well    Turn 360 Degrees  Needs close supervision or verbal cueing     Standing Unsupported, Alternately Place Feet on Step/Stool  Able to complete 4 steps without aid or supervision    Standing Unsupported, One Foot in Front  Able to take small step independently and hold 30 seconds    Standing on One Leg  Unable to try or needs assist to prevent fall    Total Score  33      Physical Performance: Performed TUG and BERG functional outcome measures. Discussed test results and compared to prior testing. Discussed community based balance and exercise programs including Marketing executive. Patient reports that she prefers to exercise on her own at home. Discussed the gym at  her facility, but patient states it is very small and does not have much equipment that she can use. Patient plans to continue to do her home exercise program daily and to try to resume her walking. Patient reports that she used to walk 1 mile and she is working her way back up to that distance. Patient reports she has been doing modified power walking using her 3 wheel walker recently and is working on improving her distance. Recommended that patient see the physical therapist that she had been working with at her facility once a week or once every other week to help her work on the higher level balance items that she needs supervision to perform. Patient in agreement.     FUNCTIONAL OUTCOME MEASURES:  Results Comments  BERG Scored 33/56 Falls risk; in need of intervention  TUG 14 seconds with AD Falls risk; in need of intervention       PT Education - 01/21/18 1211    Education Details  discussed plans for discharge and reviewed TUG and BERG testing results and compared to prior testing. discussed progress towards goals. Discussed continuing to do HEP and walking program.    Person(s) Educated  Patient    Methods  Explanation    Comprehension  Verbalized understanding       PT Short Term Goals - 01/08/18 1059      PT SHORT TERM GOAL #1   Title  Pt will be independent  with HEP in order to improve strength and balance in order to decrease fall risk and improve function at home and work.     Time  6    Period  Weeks    Status  Achieved      PT SHORT TERM GOAL #2   Title  Pt will report no further episodes of vertigo in order to ensure safety when transitioning in and out of bed and when bending over while performing household responsibilities.    Baseline  12/07/17: No vertigo since discharge from [facility] 12/01/17; no episodes of vertigo    Time  6    Period  Weeks    Status  Achieved        PT Long Term Goals - 01/21/18 1127      PT LONG TERM GOAL #1   Title  Pt will increase self-selected 10MWT by at least 0.13 m/s in order to demonstrate clinically significant improvement in community ambulation.      Baseline  12/07/17 21.7 self-selected=0.46 m/s, 19.9s fastest=0.50 m/s;    Time  12    Period  Weeks    Status  Achieved      PT LONG TERM GOAL #2   Title  Pt will improve BERG by at least 3 points in order to demonstrate clinically significant improvement in balance.      Baseline  12/07/17: 20/56; scored 25/56 on 01/08/18; scored 33/56 on 01/21/18    Time  12    Period  Weeks    Status  Achieved      PT LONG TERM GOAL #3   Title  Pt will decrease TUG by at least 23% (7.2s/at or below 24.1s) in order to demonstrate decreased improved function and decreased fall risk     Baseline  12/07/17: 31.3s; scored 17 seconds on 01/08/18    Time  12    Period  Weeks    Status  Achieved      PT LONG TERM GOAL #4   Title  Pt will  be able to perform a sit to stand from a regular height chair without UE support in order to improve functional independence at home with transfers.     Baseline  12/07/17: unable to perform sit to stand without heavy UE support    Time  12    Period  Weeks    Status  Achieved      PT LONG TERM GOAL #5   Title  Pt will improve BERG by at least 3 points in order to demonstrate clinically significant improvement in balance.       Baseline  12/07/17: 20/56; scored 25/56 on 01/08/18; scored 34/56 on 01/21/18    Time  8    Period  Weeks    Status  Achieved      PT LONG TERM GOAL #6   Title  Pt will decrease TUG to 13 seconds or less in order to demonstrate decreased improved function and decreased fall risk     Baseline  12/07/17: 31.3s; scored 17 seconds on 01/08/18; scored 14 seconds on 01/21/18 with walker    Time  8    Period  Weeks    Status  Partially Met            Plan - 01/21/18 1151    Clinical Impression Statement  Patient scored 33/56 on the BERG balance test  this date which is a large improvement as compared to prior testing on 8/23 scored 20/56 and on 9/24 scored 25/56. In addition, patient scored 14 seconds on the TUG test this date which is improved from 31.3 seconds on 8/23. Patienthas had no episodes of vertigo in several weeks and last testing for posterior and horizontal canals has been negative for possible BPPV. Patient has been making gains with her strength and her balance. Patient is independent with her home exercise program. She has met 5/6 LTG and 2/2 STG as set on plan of care. Patient plans to continue to do her home exercise program daily and continue walking upon discharge. In addition, recommended that patient follow-up with the PT that was seeing her at her facility earlier this year so that she can continue to work on the higher level balance activities with supervision. Patient is in agreement with plans for discharge at this time.     Rehab Potential  Fair    PT Frequency  2x / week    PT Duration  12 weeks    PT Treatment/Interventions  ADLs/Self Care Home Management;Aquatic Therapy;Canalith Repostioning;Cryotherapy;Electrical Stimulation;Iontophoresis 25m/ml Dexamethasone;Moist Heat;Ultrasound;DME Instruction;Gait training;Stair training;Functional mobility training;Therapeutic activities;Therapeutic exercise;Balance training;Neuromuscular re-education;Cognitive  remediation;Patient/family education;Manual techniques;Passive range of motion;Dry needling;Vestibular    PT Next Visit Plan  --    PT Home Exercise Plan  VOR x 1 horizontal and x 1 vertical (Pt was previously performing), sit to stand without UE support (elevated surface as needed, rollator in front for safety), seated exercise program (marches, abduction, adduction, LAQ), standing mini squats and slow marching    Consulted and Agree with Plan of Care  Patient       Patient will benefit from skilled therapeutic intervention in order to improve the following deficits and impairments:  Abnormal gait, Decreased balance, Difficulty walking, Dizziness, Decreased strength  Visit Diagnosis: Dizziness and giddiness  Difficulty in walking, not elsewhere classified  Muscle weakness (generalized)     Problem List Patient Active Problem List   Diagnosis Date Noted  . Carotid stenosis 12/28/2017  . Vertigo 11/30/2017  . Gait instability 10/19/2016  . Dizziness 11/03/2015  .  Hypertension 12/04/2011  . Hyperlipidemia 12/04/2011  . Syncope 12/04/2011  . Diabetes mellitus ([place]) 12/04/2011  . Smoking hx 12/04/2011   Lady Deutscher PT, DPT [phone] Lady Deutscher 01/21/2018, 12:38 PM  [place] MAIN [facility] SERVICES [address] [place], [place], [address] Phone: [phone]   Fax:  [phone]  Name: Claire Washington MRN: [id number] Date of Birth: 11/21/1924

## 2018-04-01 ENCOUNTER — Other Ambulatory Visit: Payer: Self-pay | Admitting: Cardiovascular Disease

## 2018-04-10 ENCOUNTER — Other Ambulatory Visit: Payer: Self-pay | Admitting: Cardiovascular Disease

## 2018-06-12 NOTE — Progress Notes (Signed)
Cardiology Office Note Date:  06/13/2018  Patient ID:  Claire, Washington 1924-07-10, MRN [id number] PCP:  Tracie Harrier, MD  Cardiologist:  Dr. Rockey Situ, MD    Chief Complaint: Elevated BP  History of Present Illness: Claire Washington is a 85 y.o. female with history of syncope with subdural hematoma in 2013, constipation complicated by recurrent syncope, chronic dizziness and BPPV, gait instability, carotid artery disease followed by vascular surgery, TIAs with a embolic CVA in 7915, DM2, HTN, HLD, tobacco abuse, chronic cystitis, spinal stenosis, thyroid cancer, and OA who presents for evaluation of elevated BP.  Prior episode of syncope noted in 2013 with resultant SDH. Previously evaluated with stress testing and echo at [facility], both of which were noted to be normal. With regards to her dizziness, she has been evaluated by ENT in the past and underwent physical therapy without improvement in dizziness. Event monitor in 2018 in the setting of dizziness showed NSR, rare PVCs representing less than 1% burden, no significant arrhythmia.  Symptoms of dizziness did not correlate with underlying arrhythmia.  Echo in 2018 showed normal LV systolic function, no significant valvular disease, no etiology from echo related to the patient's dizziness.  She has been evaluated by neurology for her history of strokes.  No further cardiac work-up has been felt to be needed with regards to her strokes and dizziness.  More recently, she was seen in the ER in early 11/2017 for generalized weakness with work-up being unrevealing.  This was followed by a hospital admission 1 week later for her vertigo.  The patient refused to take meclizine.  PRN Valium was recommended.  Follow-up carotid artery ultrasound in 12/2017 showed 1 to 39% bilateral ICA stenosis.  Patient was most recently seen by her primary cardiologist on 01/14/2018.  Blood pressure was 145/62.  She continued to note gait instability and  dizziness.  It appears the patient and her primary cardiologist had a long discussion regarding her medications, including antihypertensives.  She was interested in trying to come off metoprolol to see if this would help with her dizziness.  In the setting, metoprolol was discontinued and her amlodipine was titrated to 5 mg daily for 2 weeks.  Of note, the patient has previously noted lower extremity swelling while on amlodipine.  Labs: 11/2017 - potassium 4.0, serum creatinine 0.48, CBC unremarkable, troponin negative, magnesium 1.9 10/2017 - TSH normal, LFT normal 08/2017 - LDL 102  Patient comes in today noting the above trial of discontinuing metoprolol did not change her dizziness/gait instability.  In this setting, she resumed metoprolol as directed by her primary cardiologist.  She indicates she will take metoprolol 12.5 mg twice daily unless her systolic blood pressure is greater than 160, in which she will take 25 mg of metoprolol.  She has continued with amlodipine 5 mg daily.  Over the past week she has been eating excess amounts of sodium.  In this setting, she noted 4 pound weight gain as well as increase in blood pressures with systolics ranging from the 150s to 190s.  She was seen by PCP on 2/26 and started on losartan 25 mg.  Following this, she noted an improved morning blood pressure this morning of 149/56.  She denies any chest pain, shortness of breath, presyncope, or syncope.  Her chronic dizziness and gait instability are stable.  She ambulates with a walker.  No lower extremity swelling, abdominal distention, orthopnea, PND, early satiety.  Past Medical History:  Diagnosis Date  .  Arthritis   . Basal cell carcinoma    basel cell  . Chronic cystitis   . Diabetes mellitus type 2, controlled, without complications ([place])    type 2 or unspecified type diabetes mellitus without mention of complication, not stated as uncontrolled  . Disease of thyroid gland   . Embolic stroke ([place])     a. 07/2016 MRI Brain: small right superior cerebellum and left lateral occipital cortex infarcts;  b. 08/2016 Echo: EF 60-65%, Gr1 DD, mild MR;  c. 08/2016 30 Day Event Monitor: No significant tachy/bradyarrhythmias. No afib.  . Follicular neoplasm of thyroid   . Hemorrhage of rectum and anus   . Hyperlipidemia   . Hyperlipidemia, unspecified   . Hypertension   . Leg pain   . Osteoarthritis   . Pernicious anemia   . Small vessel disease ([place])   . Spinal stenosis, lumbar region without neurogenic claudication   . Subdural hematoma ([place])    a. 2013 in setting of syncope.  . Syncope and collapse    a. 2013 - syncope and fall w/ resultant subdural hematoma; b. prev nl echo and stress test @ [facility].  Marland Kitchen Thyroid nodule   . Vertigo & Chronic Dizziness    a. Previously seen by ENT with Physical Therapy.  MRI 2000 small vessel disease    Past Surgical History:  Procedure Laterality Date  . BILATERAL SALPINGOOPHORECTOMY    . COLONOSCOPY  01/26/2009   Diverticulosis  . CYSTOSCOPY    . HEMORRHOIDECTOMY BY SIMPLE LIGATION    . PR COLONOSCOPY FLX W/ENDOSCOPIC MUCOSAL RESECTION  09/02/2014   Procedure: COLONOSCOPY, FLEXIBLE; WITH ENDOSCOPIC MUCOSAL RESECTION; Surgeon: Saunders Revel, MD; [facility]; Service: Gastroenterology  . PR COLSC FLX W/RMVL OF TUMOR POLYP LESION SNARE TQ  09/02/2014   Procedure: COLONOSCOPY FLEX; W/REMOV TUMOR/LES BY SNARE; Surgeon: Saunders Revel, MD; Location: [facility]; Service: Gastroenterology  . SKIN SURGERY    . TOTAL ABDOMINAL HYSTERECTOMY W/ BILATERAL SALPINGOOPHORECTOMY  1970s    Current Meds  Medication Sig  . amLODipine (NORVASC) 5 MG tablet TAKE 1 TABLET BY MOUTH EVERY DAY  . atorvastatin (LIPITOR) 10 MG tablet TAKE 1 TABLET BY MOUTH EVERY DAY  . clopidogrel (PLAVIX) 75 MG tablet TAKE 1 TABLET (75 MG TOTAL) BY MOUTH DAILY.  . diazepam (VALIUM) 2 MG tablet Take 1 tablet (2 mg total) by mouth 3 (three)  times daily as needed (vertigo).  Marland Kitchen losartan (COZAAR) 25 MG tablet   . metoprolol tartrate (LOPRESSOR) 25 MG tablet Take 0.5 tablets (12.5 mg total) by mouth 2 (two) times daily.  . Multiple Vitamins-Minerals (CENTRUM SILVER PO) Take 1 tablet by mouth daily. Reported on 06/11/2015  . vitamin B-12 (CYANOCOBALAMIN) 1000 MCG tablet Take by mouth.    Allergies:   Hydrocortisone; Sulfa antibiotics; and Tape   Social History:  The patient  reports that she quit smoking about 24 years ago. Her smoking use included cigarettes. She has a 5.00 pack-year smoking history. She has never used smokeless tobacco. She reports current alcohol use of about 2.0 standard drinks of alcohol per week. She reports that she does not use drugs.   Family History:  The patient's family history includes Asthma in her daughter; Colon cancer in her mother; Diabetes in an other family member; Heart attack in her brother and father; Heart disease in her father; Prostate cancer in her father; Stroke in her father.  ROS:   Review of Systems  Constitutional: Positive for  malaise/fatigue. Negative for chills, diaphoresis, fever and weight loss.  HENT: Negative for congestion.   Eyes: Negative for discharge and redness.  Respiratory: Negative for cough, hemoptysis, sputum production, shortness of breath and wheezing.   Cardiovascular: Negative for chest pain, palpitations, orthopnea, claudication, leg swelling and PND.  Gastrointestinal: Negative for abdominal pain, blood in stool, heartburn, melena, nausea and vomiting.  Genitourinary: Negative for hematuria.  Musculoskeletal: Negative for falls and myalgias.  Skin: Negative for rash.  Neurological: Positive for dizziness and weakness. Negative for tingling, tremors, sensory change, speech change, focal weakness and loss of consciousness.       Unsteady gait - ambulates with a walker  Endo/Heme/Allergies: Does not bruise/bleed easily.  Psychiatric/Behavioral: Negative for  substance abuse. The patient is not nervous/anxious.   All other systems reviewed and are negative.    PHYSICAL EXAM:  VS:  BP (!) 158/60 (BP Location: Left Arm, Patient Position: Sitting, Cuff Size: Normal)   Pulse 60   Ht 5\' 1"  (1.549 m)   Wt 122 lb (55.3 kg)   BMI 23.05 kg/m  BMI: Body mass index is 23.05 kg/m.  Physical Exam  Constitutional: She is oriented to person, place, and time. She appears well-developed and well-nourished.  HENT:  Head: Normocephalic and atraumatic.  Eyes: Right eye exhibits no discharge. Left eye exhibits no discharge.  Neck: Normal range of motion. No JVD present.  Cardiovascular: Normal rate, regular rhythm, S1 normal, S2 normal and normal heart sounds. Exam reveals no distant heart sounds, no friction rub, no midsystolic click and no opening snap.  No murmur heard. Pulses:      Posterior tibial pulses are 2+ on the right side and 2+ on the left side.  Pulmonary/Chest: Effort normal and breath sounds normal. No respiratory distress. She has no decreased breath sounds. She has no wheezes. She has no rales. She exhibits no tenderness.  Abdominal: Soft. She exhibits no distension. There is no abdominal tenderness.  Musculoskeletal:        General: No edema.  Neurological: She is alert and oriented to person, place, and time.  Skin: Skin is warm and dry. No cyanosis. Nails show no clubbing.  Psychiatric: She has a normal mood and affect. Her speech is normal and behavior is normal. Judgment and thought content normal.     EKG:  Was ordered and interpreted by me today. Shows NSR, 60 bpm, LVH with early repolarization abnormality, baseline artifact, no acute st/t changes   Recent Labs: 11/30/2017: BUN 16; Creatinine, Ser 0.48; Hemoglobin 14.9; Magnesium 1.9; Platelets 223; Potassium 4.0; Sodium 139  No results found for requested labs within last 8760 hours.   CrCl cannot be calculated (Patient's most recent lab result is older than the maximum 21 days  allowed.).   Wt Readings from Last 3 Encounters:  06/13/18 122 lb (55.3 kg)  01/14/18 118 lb 8 oz (53.8 kg)  12/28/17 120 lb (54.4 kg)     Other studies reviewed: Additional studies/records reviewed today include: summarized above  ASSESSMENT AND PLAN:  1. HTN: Recent elevated blood pressures are likely in the setting of poor diet with excess sodium intake.  Blood pressure is improved following initiation of losartan 25 mg by PCP on 2/26.  I did offer for the patient to change metoprolol to carvedilol for added BP effect, she declines this at this time.  She prefers to remain on current antihypertensives at current doses.  She has agreed to decrease her sodium intake.  She will continue metoprolol  12.5 mg twice daily with self titrated escalation of metoprolol to 25 mg for systolic blood pressure greater than 160.  She prefers to take losartan around midday.  She will also remain on amlodipine.  She will call with blood pressure readings in 1 week.  We will check a BMP after she has been on losartan for 1 week.  2. Dizziness/unsteady gait: Chronic issue and stable. Continue to work with PT. Ambulating with a walker.   3. Prior stroke:  Uncertain to what degree this is playing in her dizziness and unsteady gait. Remains on Plavix as directed by neurology.   4. HLD: LDL of 102 from 08/2017. Tolerating Lipitor. Given her history of stroke, would aim for a goal LDL of < 70. Consider escalation of Lipitor and/or addition of Zetia. This will be deferred to her PCP as he manages this.    Disposition: F/u with Dr. Rockey Situ or an APP in 4 weeks.    Current medicines are reviewed at length with the patient today.  The patient did not have any concerns regarding medicines.  Signed, [name], PA-C 06/13/2018 10:51 AM     [facility] [address] [place] [address] [place],  [address] [phone]

## 2018-06-13 ENCOUNTER — Encounter: Payer: Self-pay | Admitting: Physician Assistant

## 2018-06-13 ENCOUNTER — Ambulatory Visit (INDEPENDENT_AMBULATORY_CARE_PROVIDER_SITE_OTHER): Payer: Medicare Other | Admitting: Physician Assistant

## 2018-06-13 VITALS — BP 158/60 | HR 60 | Ht 61.0 in | Wt 122.0 lb

## 2018-06-13 DIAGNOSIS — E785 Hyperlipidemia, unspecified: Secondary | ICD-10-CM

## 2018-06-13 DIAGNOSIS — I10 Essential (primary) hypertension: Secondary | ICD-10-CM | POA: Diagnosis not present

## 2018-06-13 DIAGNOSIS — Z8673 Personal history of transient ischemic attack (TIA), and cerebral infarction without residual deficits: Secondary | ICD-10-CM | POA: Diagnosis not present

## 2018-06-13 DIAGNOSIS — R42 Dizziness and giddiness: Secondary | ICD-10-CM | POA: Diagnosis not present

## 2018-06-13 DIAGNOSIS — R2681 Unsteadiness on feet: Secondary | ICD-10-CM | POA: Diagnosis not present

## 2018-06-13 MED ORDER — HYDRALAZINE HCL 25 MG PO TABS: 25.0000 mg | ORAL_TABLET | Freq: Three times a day (TID) | ORAL | 3 refills | Status: DC | PRN

## 2018-06-13 MED ORDER — LOSARTAN POTASSIUM 25 MG PO TABS: 25.0000 mg | ORAL_TABLET | Freq: Every day | ORAL | 3 refills | Status: DC

## 2018-06-13 NOTE — Patient Instructions (Signed)
Medication Instructions:  Your physician has recommended you make the following change in your medication:  1- START Take 1 tablet (25 mg total) by mouth 3 (three) times daily as needed. As needed for systolic blood pressure >607.  If you need a refill on your cardiac medications before your next appointment, please call your pharmacy.   Lab work: Your physician recommends that you return to clinic for lab work in: 1 week (BMET) If you have labs (blood work) drawn today and your tests are completely normal, you will receive your results only by: Marland Kitchen MyChart Message (if you have MyChart) OR . A paper copy in the mail If you have any lab test that is abnormal or we need to change your treatment, we will call you to review the results.  Testing/Procedures: None ordered   Follow-Up: At [facility], you and your health needs are our priority.  As part of our continuing mission to provide you with exceptional heart care, we have created designated Provider Care Teams.  These Care Teams include your primary Cardiologist (physician) and Advanced Practice Providers (APPs -  Physician Assistants and Nurse Practitioners) who all work together to provide you with the care you need, when you need it. You will need a follow up appointment in 4 weeks.  You may see Dr. Rockey Situ or [name], PA-C.  Any Other Special Instructions Will Be Listed Below (If Applicable). Come the clinic in 1 weeks with BP readings (provide when you come for labs) How to use a home blood pressure monitor. . Be still. Don't smoke, drink caffeinated beverages or exercise within 30 minutes before measuring your blood pressure. . Sit correctly. Sit with your back straight and supported (on a dining chair, rather than a sofa). Your feet should be flat on the floor and your legs should not be crossed. Your arm should be supported on a flat surface (such as a table) with the upper arm at heart level. Make sure the bottom of the cuff is  placed directly above the bend of the elbow.  . Measure at the same time every day. It's important to take the readings at the same time each day, such as morning and evening. Take reading approximately 1 hour after BP medications.

## 2018-06-19 ENCOUNTER — Telehealth: Payer: Self-pay | Admitting: Cardiovascular Disease

## 2018-06-19 DIAGNOSIS — I10 Essential (primary) hypertension: Secondary | ICD-10-CM

## 2018-06-19 NOTE — Telephone Encounter (Signed)
Left a message for the patient to call back.  

## 2018-06-19 NOTE — Telephone Encounter (Signed)
Pt c/o medication issue:  1. Name of Medication: Losartan  2. How are you currently taking this medication (dosage and times per day)? 25 mg  3. Are you having a reaction (difficulty breathing--STAT-pt denies any reaction  4. What is your medication issue? Pt has stopped Losartan due to her BP dropping. Pt BP readings: 2/29-144/58 am, 137/52 pm 3/1-149/57 am, 162/59 pm 3/2 -158/61 am, 142/61 pm 3/3-162/58 am, 156/56 pm 34/ 151/58 am

## 2018-06-20 ENCOUNTER — Other Ambulatory Visit: Payer: Medicare Other

## 2018-06-20 NOTE — Telephone Encounter (Signed)
Attempted to call patient. LMTCB 06/20/2018

## 2018-06-20 NOTE — Telephone Encounter (Signed)
Blood pressure readings are still elevated. A systolic BP of 993, is not normal and elevated. If her renal function tolerates losartan, she should restart this medication with a follow up bmet 1 week after starting to ensure stable renal function. If this medication leads to worsening renal function it can be changed to something else. Alternatively, she could increase her amlodipine to 10 mg daily and hold off on the addition of losartan.

## 2018-06-20 NOTE — Telephone Encounter (Signed)
Pt calling as FYI to let us know that she is holding Losartan due to "pressure normalizing".   She was seen by PCP on 2/26 and RD on 2/27 for c/o HTN after consuming high salt and gaining 4 pounds. Dr. Ginette Pitman started her on Losartan which she took for 2 days and stopped.   Her BP readings are as follows; 2/29-144/58 am, 137/52 pm 3/1-149/57 am, 162/59 pm 3/2 -158/61 am, 142/61 pm 3/3-162/58 am, 156/56 pm 34/ 151/58 am.  These do not include the days she was taking medication (2/26-2/28). She denies knowing values but reports SBP min was 130.  She expresses concern for losartan "causing kidney issues and in the past they had me on it and stopped because of my kidneys".   I expressed to patient I felt these pressures were still high. She reported that her baseline SBP is in the 150's. Pt states, "I will take it if I need it".   Pt reports eating heart healthy, low sodium diet since her last OV. She denies chest pain, SOB or swelling.   Routed to provider for further review and outlined blood pressure goals for patient.

## 2018-06-21 NOTE — Telephone Encounter (Signed)
Attempted to call patient. LMTCB 06/21/2018

## 2018-06-25 NOTE — Telephone Encounter (Signed)
Attempted to call pt. No VM available at this time.

## 2018-06-26 NOTE — Telephone Encounter (Signed)
Attempted call again to patient, unavailable at this time. LMOM.   Called daughter, LMOM.

## 2018-06-27 NOTE — Telephone Encounter (Signed)
Patient returning call.

## 2018-06-27 NOTE — Telephone Encounter (Signed)
Returned the pt call. [email or web address] on both tel #s listed for the pt.

## 2018-06-28 NOTE — Telephone Encounter (Signed)
Returned call to [name] to discuss POC from provider. Pt expressed understanding that goal SBP should be < 150. She agreed to resume Losartan today and scheduled for BMET lab appt on 3/20.   She reported that she will be taking Losartan in the early afternoon, she reports that [name], PA told her this was okay.   Will follow up with BP readings after BMET is resulted.  Message routed to provider as FYI only.

## 2018-06-28 NOTE — Telephone Encounter (Signed)
Patient returning call.

## 2018-07-01 ENCOUNTER — Telehealth: Payer: Self-pay | Admitting: Cardiovascular Disease

## 2018-07-01 DIAGNOSIS — I10 Essential (primary) hypertension: Secondary | ICD-10-CM

## 2018-07-01 NOTE — Telephone Encounter (Signed)
Patient calling to check on status.

## 2018-07-01 NOTE — Telephone Encounter (Signed)
Return call to patient after receiving BP values since 3/12. Last phone note on 3/13, [name], PA requested that patient begin taking Losartan, as he indicated at last OV. She reports compliance.   Currently she is taking Metoprolol 2 x daily (12-25 mg) she reports if BP > 160 she takes 25mg .  Amlodipine 5 mg AM Losartan 25 mg afternoon since 3/13. Hydralazine 25 mg TID is ordered PRN for BP >170, although she denies taking d/t "I am taking too many medications".   BP Values 3/13 PM 164/65 3/14 AM 174/74, evening 166/57 3/15 AM 158/66, evening 178/67 3/16 AM 174/68.   All values are taken right before morning meds are taken.   She currently denies increased stress, inc salt intake, headache, blurred vision, chest pain or SOB.  She does endorse dizziness, which is at her BL. She reports 6 lb weight gain since the holidays.   She feels the losartan is causing dry mouth.   I had her take BP while she was on the phone with me which read 168/68.   Pt is concerned and wants to hear recommendations from Dr. Rockey Situ only. He is the DOD today and I will forward call to him.   Message routed.

## 2018-07-01 NOTE — Telephone Encounter (Signed)
Pt c/o BP issue: STAT if pt c/o blurred vision, one-sided weakness or slurred speech  1. What are your last 5 BP readings?  3/12 morning 163/66 evening 161/61 3/13 morning 146/58 evening with losartan 164/65 3/14 morning 174/74 evening 166/57 3/15 morning 158/66 evening 178/67 3/16 morning 174/68  2. Are you having any other symptoms (ex. Dizziness, headache, blurred vision, passed out)?   3. What is your BP issue? States that her BP has not been getting any better.  Please call to discuss.

## 2018-07-02 MED ORDER — AMLODIPINE BESYLATE 5 MG PO TABS: 5.0000 mg | ORAL_TABLET | Freq: Two times a day (BID) | ORAL | 3 refills | Status: DC

## 2018-07-02 NOTE — Telephone Encounter (Signed)
Patient is calling to check on status of advise from Dr. Rockey Situ

## 2018-07-02 NOTE — Telephone Encounter (Signed)
Patient verbalized understanding to increase amlodipine to 5 mg two times a day. She is also aware to take BP/HR about 2 hours after morning meds. Readings provided have been before medications: 3/16 174/68,  64      AM  167/59,  93 PM 3/17 167/63,  63 AM  States she does not want to take the losartan because the 3 days she took it her BP was elevated. She is due to lab work on Friday and wants to know if she still needs it if not taking losartan.  Routing to [place] who saw patient last for review and advice.

## 2018-07-02 NOTE — Telephone Encounter (Signed)
Losartan does not cause elevated BP. I will leave it up to her with regards to continuing losartan. If she self discontinues losartan, labs will not be needed. However, if she does stop that medication, she will need an alternative to compensate. AN option would be to change from metoprolol to Coreg for added BP effect. We discussed this at her visit, though she declined that change.

## 2018-07-02 NOTE — Telephone Encounter (Signed)
Patient agreeable to try losartan again for the next week after reviewing Ryan's recommendations. Rescheduled lab work to next Tuesday.

## 2018-07-02 NOTE — Telephone Encounter (Signed)
Would consider increase the amlodipine up to 5 mg twice a day Need BP numbers 2 hours after AM meds, not before

## 2018-07-05 ENCOUNTER — Other Ambulatory Visit: Payer: Medicare Other

## 2018-07-08 NOTE — Telephone Encounter (Signed)
Returned call to pt in regards to labs needed.   [facility]   Pt reports started back on losartan on 3/18.   Current daily BP meds are; Amlodipine 5 mg BID, Metoprolol 12.5 BID, Losartan 25 mg once daily around 2 PM.   All Bps are recorded 2 hrs after morning medications;  3/18 148/57 HR 64 3/19 142/62, HR 63 3/20 147/59, HR 64 3/21 138/53, HR 65 3/22 125/59, HR 61 3/23 142/56, HR 63  She did note that sat morning 3/21 she awoke to headache and BP was 177/65 but resolved after medications.   Routed to provider as Juluis Rainier.

## 2018-07-08 NOTE — Telephone Encounter (Signed)
Patient wants to know if this lab is essential as she is weary about coming to hospital at this time

## 2018-07-09 ENCOUNTER — Other Ambulatory Visit: Payer: Medicare Other

## 2018-07-10 ENCOUNTER — Other Ambulatory Visit
Admission: RE | Admit: 2018-07-10 | Discharge: 2018-07-10 | Disposition: A | Discharge status: Home / Self Care | Payer: Medicare Other | Source: Ambulatory Visit | Attending: Physician Assistant | Admitting: Physician Assistant

## 2018-07-10 DIAGNOSIS — I10 Essential (primary) hypertension: Secondary | ICD-10-CM | POA: Diagnosis present

## 2018-07-10 LAB — BASIC METABOLIC PANEL
ANION GAP: 9 (ref 5–15)
BUN: 25 mg/dL — ABNORMAL HIGH (ref 8–23)
CALCIUM: 9.2 mg/dL (ref 8.9–10.3)
CHLORIDE: 104 mmol/L (ref 98–111)
CO2: 24 mmol/L (ref 22–32)
Creatinine, Ser: 0.95 mg/dL (ref 0.44–1.00)
GFR calc non Af Amer: 52 mL/min — ABNORMAL LOW (ref >60)
Glucose, Bld: 121 mg/dL — ABNORMAL HIGH (ref 70–99)
POTASSIUM: 4.2 mmol/L (ref 3.5–5.1)
Sodium: 137 mmol/L (ref 135–145)

## 2018-07-11 ENCOUNTER — Telehealth: Payer: Self-pay

## 2018-07-11 NOTE — Telephone Encounter (Signed)
TELEPHONE CALL NOTE  Claire Washington has been deemed a candidate for a follow-up tele-health visit to limit community exposure during the Covid-19 pandemic. I spoke with the patient via phone to ensure availability of phone/video source, confirm preferred email & phone number, and discuss instructions and expectations.  I reminded Claire Washington to be prepared with any vital sign and/or heart rhythm information that could potentially be obtained via home monitoring, at the time of her visit. I reminded Claire Washington to expect an e-mail containing a link for their video-based visit approximately 15 minutes before her visit, or alternatively, a phone call at the time of her visit if her visit is planned to be a phone encounter.  STAFF MUST READ CONSENT VERBATIM TO PATIENT BELOW - Did the patient verbally consent to treatment as below? YES   Claire Washington, [facility] 07/11/2018 1:39 PM  CONSENT FOR TELE-HEALTH VISIT - PLEASE REVIEW  I hereby voluntarily request, consent and authorize [facility] and its employed or contracted physicians, physician assistants, nurse practitioners or other licensed health care professionals (the Practitioner), to provide me with telemedicine health care services (the "Services") as deemed necessary by the treating Practitioner. I acknowledge and consent to receive the Services by the Practitioner via telemedicine. I understand that the telemedicine visit will involve communicating with the Practitioner through live audiovisual communication technology and the disclosure of certain medical information by electronic transmission. I acknowledge that I have been given the opportunity to request an in-person assessment or other available alternative prior to the telemedicine visit and am voluntarily participating in the telemedicine visit.  I understand that I have the right to withhold or withdraw my consent to the use of telemedicine in the course of my care at  any time, without affecting my right to future care or treatment, and that the Practitioner or I may terminate the telemedicine visit at any time. I understand that I have the right to inspect all information obtained and/or recorded in the course of the telemedicine visit and may receive copies of available information for a reasonable fee.  I understand that some of the potential risks of receiving the Services via telemedicine include:  Marland Kitchen Delay or interruption in medical evaluation due to technological equipment failure or disruption; . Information transmitted may not be sufficient (e.g. poor resolution of images) to allow for appropriate medical decision making by the Practitioner; and/or  . In rare instances, security protocols could fail, causing a breach of personal health information.  Furthermore, I acknowledge that it is my responsibility to provide information about my medical history, conditions and care that is complete and accurate to the best of my ability. I acknowledge that Practitioner's advice, recommendations, and/or decision may be based on factors not within their control, such as incomplete or inaccurate data provided by me or distortions of diagnostic images or specimens that may result from electronic transmissions. I understand that the practice of medicine is not an exact science and that Practitioner makes no warranties or guarantees regarding treatment outcomes. I acknowledge that I will receive a copy of this consent concurrently upon execution via email to the email address I last provided but may also request a printed copy by calling the office of [place].    I understand that my insurance will be billed for this visit.   I have read or had this consent read to me. . I understand the contents of this consent, which adequately explains the benefits and risks  of the Services being provided via telemedicine.  . I have been provided ample opportunity to ask questions  regarding this consent and the Services and have had my questions answered to my satisfaction. . I give my informed consent for the services to be provided through the use of telemedicine in my medical care  By participating in this telemedicine visit I agree to the above.

## 2018-07-12 NOTE — Progress Notes (Signed)
Virtual Visit via Telephone Note    Evaluation Performed:  Follow-up visit  This visit type was conducted due to national recommendations for restrictions regarding the COVID-19 Pandemic (e.g. social distancing).  This format is felt to be most appropriate for this patient at this time.  All issues noted in this document were discussed and addressed.  No physical exam was performed (except for noted visual exam findings with Video Visits).  Please refer to the patient's chart (MyChart message for video visits and phone note for telephone visits) for the patient's consent to telehealth for [facility].  Date:  07/17/2018   ID:  Claire Washington, DOB [birth year], MRN [id number]  Patient Location:  Home  Provider location:   Home  PCP:  Tracie Harrier, MD  Cardiologist:  Ida Rogue, MD  Electrophysiologist:  None   Chief Complaint:  Telehealth follow up  History of Present Illness:    Claire Washington is a 85 y.o. female who presents via audio/video conferencing for a telehealth visit today.  She has a history of syncope with subdural hematoma in 2013, constipation complicated by recurrent syncope, chronic dizziness and BPPV, gait instability, carotid artery disease followed by vascular surgery, TIAs with a embolic CVA in 6945, DM2, HTN, HLD, tobacco abuse, chronic cystitis, spinal stenosis, thyroid cancer, and OA.  Prior episode of syncope noted in 2013 with resultant SDH. Previously evaluated with stress testing and echo at [facility], both of which were noted to be normal. With regards to her dizziness, she has been evaluated by ENT in the past and underwent physical therapy without improvement in dizziness. Event monitor in 2018 in the setting of dizziness showed NSR, rare PVCs representing less than 1% burden, no significant arrhythmia.  Symptoms of dizziness did not correlate with underlying arrhythmia.  Echo in 2018 showed normal LV systolic function, no significant  valvular disease.  She has been evaluated by neurology for her history of strokes.  No further cardiac work-up has been felt to be needed with regards to her strokes and dizziness.  More recently, she was seen in the ER in early 11/2017 for generalized weakness with work-up being unrevealing.  This was followed by a hospital admission 1 week later for vertigo.  The patient refused to take meclizine.  PRN Valium was recommended.  Follow-up carotid artery ultrasound in 12/2017 showed 1 to 39% bilateral ICA stenosis.  Patient was seen by her primary cardiologist on 01/14/2018.  Blood pressure was 145/62.  She continued to note gait instability and dizziness.  She was interested in trying to come off metoprolol to see if this would help with her dizziness.  In this setting, metoprolol was discontinued and her amlodipine was titrated to 5 mg daily for 2 weeks.  Of note, the patient has previously noted lower extremity swelling while on amlodipine.  She was most recently seen in the office on 06/13/2018 noting that the trial of discontinuing metoprolol did not change her dizziness/gait instability.  In this setting, she resumed metoprolol as directed by her primary cardiologist.  She reported to me on 2/27 she would take metoprolol 12.5 mg twice daily unless her systolic blood pressure was greater than 160, at which time she would take 25 mg of metoprolol.  She reported compliance with amlodipine 5 mg daily.  She had also noted she had been eating excess amounts of sodium and reported a 4 pound weight gain as well as increase in blood pressures with systolics ranging from the 150s  to 190s.  She reported having been seen by PCP 1 day prior on 2/26 and started on losartan 25 mg daily.  We discussed transition from metoprolol to carvedilol for added BP effect, she declined this.  She preferred to remain on her current dose of metoprolol 12.5 mg twice daily with self titration to 25 mg for systolic blood pressure greater than  160 as well as amlodipine and losartan.  Following this, in mid 06/2018 she self discontinued losartan as she felt like this was leading to her elevated blood pressure readings.  She was subsequently informed that is not the case and ultimately resumed this medication with follow-up BMP showing normal serum creatinine of 0.95 with a potassium of 4.2.  Follow-up patient reported blood pressure readings back on losartan along with amlodipine and metoprolol show systolics ranging from the mid 120s to 140s over 50s to 60s with heart rate in the 60s bpm.  She is doing well. Blood pressure has stabilized nicely with readings in the morning 2 hours after her medications now running in the 1962 to 229N systolic with heart rates typically in the low to mid 60s bpm.  In the evening hours she is historically checking her blood pressure prior to taking her medicines as she is usually asleep at the 2-hour post medication mark.  Blood pressures in this setting are typically in the 140s to rare 989 systolic.  She has not needed any PRN hydralazine.  She denies any chest pain, shortness of breath, dizziness, recently, or syncope.  No lower extremity swelling, abdominal fullness, orthopnea, PND, early satiety.  No falls.  She is quite pleased with the improvement in her blood pressure.  She continues to eat a diet low in sodium.  Labs: 11/2017 - potassium 4.0, serum creatinine 0.48, CBC unremarkable, troponin negative, magnesium 1.9 10/2017 - TSH normal, LFT normal 08/2017 - LDL 102   The patient does not have symptoms concerning for COVID-19 infection (fever, chills, cough, or new shortness of breath).    Prior CV studies:   The following studies were reviewed today:  2D echo 08/2016:  - Left ventricle: The cavity size was normal. There was mild   concentric hypertrophy. Systolic function was normal. The   estimated ejection fraction was in the range of 60% to 65%. Wall   motion was normal; there were no regional  wall motion   abnormalities. Doppler parameters are consistent with abnormal   left ventricular relaxation (grade 1 diastolic dysfunction). - Mitral valve: Calcified annulus. There was mild regurgitation. - Pulmonary arteries: Systolic pressure could not be accurately   estimated. __________  Outpatient cardiac monitoring 08/2016: Normal sinus rhythm Rare PVCs, <1% No other significant arrhythmia noted Symptoms of dizziness did not seem to correlate to underlying arrhythmia __________  Past Medical History:  Diagnosis Date   Arthritis    Basal cell carcinoma    basel cell   Chronic cystitis    Diabetes mellitus type 2, controlled, without complications ([place])    type 2 or unspecified type diabetes mellitus without mention of complication, not stated as uncontrolled   Disease of thyroid gland    Embolic stroke ([place])    a. 07/2016 MRI Brain: small right superior cerebellum and left lateral occipital cortex infarcts;  b. 08/2016 Echo: EF 60-65%, Gr1 DD, mild MR;  c. 08/2016 30 Day Event Monitor: No significant tachy/bradyarrhythmias. No afib.   Follicular neoplasm of thyroid    Hemorrhage of rectum and anus    Hyperlipidemia  Hyperlipidemia, unspecified    Hypertension    Leg pain    Osteoarthritis    Pernicious anemia    Small vessel disease ([place])    Spinal stenosis, lumbar region without neurogenic claudication    Subdural hematoma ([place])    a. 2013 in setting of syncope.   Syncope and collapse    a. 2013 - syncope and fall w/ resultant subdural hematoma; b. prev nl echo and stress test @ [facility].   Thyroid nodule    Vertigo & Chronic Dizziness    a. Previously seen by ENT with Physical Therapy.  MRI 2000 small vessel disease   Past Surgical History:  Procedure Laterality Date   BILATERAL SALPINGOOPHORECTOMY     COLONOSCOPY  01/26/2009   Diverticulosis   CYSTOSCOPY     HEMORRHOIDECTOMY BY SIMPLE LIGATION     PR COLONOSCOPY FLX  W/ENDOSCOPIC MUCOSAL RESECTION  09/02/2014   Procedure: COLONOSCOPY, FLEXIBLE; WITH ENDOSCOPIC MUCOSAL RESECTION; Surgeon: Saunders Revel, MD; Location: [facility]; Service: Gastroenterology   PR [place] FLX W/RMVL OF TUMOR POLYP LESION SNARE TQ  09/02/2014   Procedure: COLONOSCOPY FLEX; W/REMOV TUMOR/LES BY SNARE; Surgeon: Saunders Revel, MD; [facility]: [facility]; Service: Gastroenterology   SKIN SURGERY     TOTAL ABDOMINAL HYSTERECTOMY W/ BILATERAL SALPINGOOPHORECTOMY  1970s     Current Meds  Medication Sig   amLODipine (NORVASC) 5 MG tablet Take 1 tablet (5 mg total) by mouth 2 (two) times daily.   atorvastatin (LIPITOR) 10 MG tablet TAKE 1 TABLET BY MOUTH EVERY DAY   clopidogrel (PLAVIX) 75 MG tablet TAKE 1 TABLET (75 MG TOTAL) BY MOUTH DAILY.   diazepam (VALIUM) 2 MG tablet Take 1 tablet (2 mg total) by mouth 3 (three) times daily as needed (vertigo).   hydrALAZINE (APRESOLINE) 25 MG tablet Take 1 tablet (25 mg total) by mouth 3 (three) times daily as needed. As needed for systolic blood pressure >161.   losartan (COZAAR) 25 MG tablet Take 1 tablet (25 mg total) by mouth daily.   metoprolol tartrate (LOPRESSOR) 25 MG tablet Take 0.5 tablets (12.5 mg total) by mouth 2 (two) times daily.   Multiple Vitamins-Minerals (CENTRUM SILVER PO) Take 1 tablet by mouth daily. Reported on 06/11/2015   vitamin B-12 (CYANOCOBALAMIN) 1000 MCG tablet Take by mouth.     Allergies:   Hydrocortisone; Sulfa antibiotics; and Tape   Social History   Tobacco Use   Smoking status: Former Smoker    Packs/day: 0.25    Years: 20.00    Pack years: 5.00    Types: Cigarettes    Last attempt to quit: 09/15/1993    Years since quitting: 24.8   Smokeless tobacco: Never Used  Substance Use Topics   Alcohol use: Yes    Alcohol/week: 2.0 standard drinks    Types: 1 Glasses of wine, 1 Shots of liquor per week    Comment: occassional   Drug use: No     Family  Hx: The patient's family history includes Asthma in her daughter; Colon cancer in her mother; Diabetes in an other family member; Heart attack in her brother and father; Heart disease in her father; Prostate cancer in her father; Stroke in her father.  ROS:   Please see the history of present illness.     All other systems reviewed and are negative.   Labs/Other Tests and Data Reviewed:    Recent Labs: 11/30/2017: Hemoglobin 14.9; Magnesium 1.9; Platelets 223 07/10/2018: BUN 25; Creatinine, Ser  0.95; Potassium 4.2; Sodium 137   Recent Lipid Panel No results found for: CHOL, TRIG, HDL, CHOLHDL, LDLCALC, LDLDIRECT  Wt Readings from Last 3 Encounters:  07/17/18 120 lb 6 oz (54.6 kg)  06/13/18 122 lb (55.3 kg)  01/14/18 118 lb 8 oz (53.8 kg)     Exam:    Vital Signs:  BP (!) 124/53 (BP Location: Left Arm, Patient Position: Sitting)    Pulse 65    Ht 5\' 1"  (1.549 m)    Wt 120 lb 6 oz (54.6 kg)    BMI 22.74 kg/m    Well nourished, well developed female in no acute distress.   ASSESSMENT & PLAN:    1.  Hypertension: -Blood pressure is much improved -Continue metoprolol 12.5 mg twice daily, amlodipine 5 mg twice daily, and losartan 25 mg daily with PRN hydralazine -Continue low-sodium diet -If blood pressure continues to improve and she is consistently having readings in the 1 teens to low 350K systolic, could consider discontinuing losartan (patient preference)  2.  History of stroke: -Remains on Plavix as directed by neurology  3.  Hyperlipidemia: -LDL of 102 from 08/2017 -Tolerating Lipitor -Given her history of stroke, would aim for goal LDL less than 70 -Once social distancing recommendations from COVID-19 have lessened would plan for fasting lipid panel and liver function and escalate Lipitor with the possible addition of Zetia as indicated  COVID-19 Education: The signs and symptoms of COVID-19 were discussed with the patient and how to seek care for testing (follow up  with PCP or arrange E-visit).  The importance of social distancing was discussed today.  Patient Risk:   After full review of this patients clinical status, I feel that they are at least moderate risk at this time.  Time:   Today, I have spent 11 minutes with the patient with telehealth technology discussing the above.     Medication Adjustments/Labs and Tests Ordered: Current medicines are reviewed at length with the patient today.  Concerns regarding medicines are outlined above.  Tests Ordered: No orders of the defined types were placed in this encounter.  Medication Changes: No orders of the defined types were placed in this encounter.   Disposition:  Follow up in 6 month(s)  Signed, [name], PA-C  07/17/2018 10:31 AM    [place]

## 2018-07-17 ENCOUNTER — Telehealth (INDEPENDENT_AMBULATORY_CARE_PROVIDER_SITE_OTHER): Payer: Medicare Other | Admitting: Physician Assistant

## 2018-07-17 ENCOUNTER — Encounter: Payer: Self-pay | Admitting: Physician Assistant

## 2018-07-17 ENCOUNTER — Other Ambulatory Visit: Payer: Self-pay

## 2018-07-17 VITALS — BP 124/53 | HR 65 | Ht 61.0 in | Wt 120.4 lb

## 2018-07-17 DIAGNOSIS — I10 Essential (primary) hypertension: Secondary | ICD-10-CM

## 2018-07-17 DIAGNOSIS — Z8673 Personal history of transient ischemic attack (TIA), and cerebral infarction without residual deficits: Secondary | ICD-10-CM

## 2018-07-17 DIAGNOSIS — E785 Hyperlipidemia, unspecified: Secondary | ICD-10-CM

## 2018-07-17 NOTE — Patient Instructions (Signed)
It was a pleasure to speak with you on the phone today! Thank you for allowing [place] to continue taking care of your [facility] needs during this time.   Feel free to call as needed for questions and concerns related to your cardiac needs.  Medication Instructions:  Your physician recommends that you continue on your current medications as directed. Please refer to the Current Medication list given to you today.  If you need a refill on your cardiac medications before your next appointment, please call your pharmacy.   Lab work: None ordered  If you have labs (blood work) drawn today and your tests are completely normal, you will receive your results only by: Marland Kitchen MyChart Message (if you have MyChart) OR . A paper copy in the mail If you have any lab test that is abnormal or we need to change your treatment, we will call you to review the results.  Testing/Procedures: None ordered   Follow-Up: At [facility], you and your health needs are our priority.  As part of our continuing mission to provide you with exceptional heart care, we have created designated Provider Care Teams.  These Care Teams include your primary Cardiologist (physician) and Advanced Practice Providers (APPs -  Physician Assistants and Nurse Practitioners) who all work together to provide you with the care you need, when you need it. You will need a follow up appointment in 6 months.  Please call our office 2 months in advance to schedule this appointment.  You may see Ida Rogue, MD or [name], PA-C.

## 2018-07-25 ENCOUNTER — Other Ambulatory Visit: Payer: Self-pay | Admitting: Cardiovascular Disease

## 2018-07-29 ENCOUNTER — Telehealth: Payer: Self-pay | Admitting: Cardiovascular Disease

## 2018-07-29 NOTE — Telephone Encounter (Signed)
Patient c/o Palpitations:  High priority if patient c/o lightheadedness, shortness of breath, or chest pain  1) How long have you had palpitations/irregular HR/ Afib? Are you having the symptoms now? No one episode last night    2) Are you currently experiencing lightheadedness, SOB or CP?  No   3) Do you have a history of afib (atrial fibrillation) or irregular heart rhythm? No   4) Have you checked your BP or HR? (document readings if available): last night HR 114 but thinks it went higher BP last night 138/51  126/49 156/53 This morning 133/54  Lowest BP 128 systolic   Patient concerned about taking meds when bp drops    5) Are you experiencing any other symptoms? During episode last night had speech issues

## 2018-07-29 NOTE — Telephone Encounter (Signed)
Tried dialing patient's number 2 times. No answer or voicemail after several rings.

## 2018-07-30 NOTE — Telephone Encounter (Signed)
Blood pressure is reasonably controlled off losartan. She can remain off this and let us know if it trends upwards. With regards to the palpitations and aphasia, given symptoms have resolved at this time I think we can hold off on having her go to the ED, but if symptoms return she should seek evaluation.

## 2018-07-30 NOTE — Telephone Encounter (Signed)
Spoke with patient. Says she's doing much better today. No other episodes. Denies chest pain, palpitations, dizziness. She has a few seconds where she felt she could not speak. She has decreased salt intake and stopped drinking wine occasionally and thinks this is helping with her blood pressure stay down. She decided to stop the losartan and took her last dose on 4/12. 4/12 125/56,  63 Prior to meds (last day took losartan)  113/52,  62 2 hours after 4/13 142/53,  81 Prior to meds  114/72,       2 hours after (no losartan) 4/14 135/54,  64 Prior to meds   125/33,  60 2 hours after  (no losartan)  Stopping losartan was discussed at Virtual visit with Thurmond Butts on 4/1. Advised patient to continue to monitor BP/HR and call us if any new questions or concerns arise. Routing to [place] to make him aware.

## 2018-07-30 NOTE — Telephone Encounter (Signed)
Spoke with the patient and made her aware of Ryan's recommendation below. Patient verbalized understanding and voiced appreciation for the call.

## 2018-09-10 ENCOUNTER — Telehealth: Payer: Self-pay | Admitting: Cardiovascular Disease

## 2018-09-10 NOTE — Telephone Encounter (Signed)
Pt c/o medication issue:  1. Name of Medication: amlodipine   2. How are you currently taking this medication (dosage and times per day)? Changed to BID increase  3. Are you having a reaction (difficulty breathing--STAT)? bp low   4. What is your medication issue? Patient concerned bp too low now   BP LOG after taking meds   5/21  117/52  5/22  112/51  5/23  118/50  5/24  125/53  5/25  115/54  5/26  117/51

## 2018-09-10 NOTE — Telephone Encounter (Signed)
No answer. Left message to call back.   

## 2018-09-10 NOTE — Telephone Encounter (Signed)
Spoke with patient.  She feels her blood pressure is too low for her. See previous entry for readings. They were taken about 2 hours after morning doses of medications. Denies dizziness, shortness of breath or chest pain.  Reports a similar thing happened before and Dr Ginette Pitman, her internal medicine doctor, said it was low for her. I encouraged her that the readings were stable though she insisted they were low. Her only symptoms is she feels weakness which she thinks is coming from her blood pressure. Routing to [employer] for advice.

## 2018-09-10 NOTE — Telephone Encounter (Signed)
Blood pressure is well controlled.  However, if she would like, we can change amlodipine to 5 mg once daily rather than twice daily.

## 2018-09-11 NOTE — Telephone Encounter (Signed)
Called patient. She verbalized understanding that she can decrease to 5 mg daily. She said she was afraid to cut back that much. She is going to see Dr Ginette Pitman today and is going to discuss with him as well before she makes a final decision.

## 2018-09-13 ENCOUNTER — Telehealth: Payer: Self-pay | Admitting: Cardiovascular Disease

## 2018-09-13 NOTE — Telephone Encounter (Signed)
Claire Washington,  She is not on eliquis.  I called to follow up on the message below and she said she did not acll about this.  Thinking this was on another patient maybe. Any thoughts?  Thanks!

## 2018-09-13 NOTE — Telephone Encounter (Signed)
Closing erroneous encounter .  See new msg

## 2018-09-13 NOTE — Telephone Encounter (Signed)
Pt c/o medication issue:  1. Name of Medication: plavix and eliquis   2. How are you currently taking this medication (dosage and times per day)?   3. Are you having a reaction (difficulty breathing--STAT)? no 4. What is your medication issue? pcp asked patient to call office to discuss why she is taking both of these

## 2018-09-19 ENCOUNTER — Other Ambulatory Visit: Payer: Self-pay | Admitting: Internal Medicine

## 2018-09-19 DIAGNOSIS — N644 Mastodynia: Secondary | ICD-10-CM

## 2018-09-20 ENCOUNTER — Ambulatory Visit
Admission: RE | Admit: 2018-09-20 | Discharge: 2018-09-20 | Disposition: A | Discharge status: Home / Self Care | Payer: Medicare Other | Source: Ambulatory Visit | Attending: Internal Medicine | Admitting: Internal Medicine

## 2018-09-20 ENCOUNTER — Other Ambulatory Visit: Payer: Self-pay

## 2018-09-20 DIAGNOSIS — N644 Mastodynia: Secondary | ICD-10-CM

## 2018-09-26 ENCOUNTER — Other Ambulatory Visit: Payer: Self-pay | Admitting: Internal Medicine

## 2018-09-26 DIAGNOSIS — N644 Mastodynia: Secondary | ICD-10-CM

## 2018-10-01 ENCOUNTER — Ambulatory Visit
Admission: RE | Admit: 2018-10-01 | Discharge: 2018-10-01 | Disposition: A | Discharge status: Home / Self Care | Payer: Medicare Other | Source: Ambulatory Visit | Attending: Internal Medicine | Admitting: Internal Medicine

## 2018-10-01 ENCOUNTER — Other Ambulatory Visit: Payer: Self-pay

## 2018-10-01 DIAGNOSIS — N644 Mastodynia: Secondary | ICD-10-CM | POA: Diagnosis not present

## 2018-10-01 MED ORDER — IOHEXOL 300 MG/ML  SOLN
75.0000 mL | Freq: Once | INTRAMUSCULAR | Status: AC | PRN
  Administered 2018-10-01: 60 mL via INTRAVENOUS

## 2018-12-26 ENCOUNTER — Inpatient Hospital Stay
Admission: EM | Admit: 2018-12-26 | Discharge: 2018-12-30 | DRG: 603 | Disposition: A | Discharge status: Skilled Nursing Facility | Payer: Medicare Other | Attending: Internal Medicine | Admitting: Internal Medicine

## 2018-12-26 ENCOUNTER — Other Ambulatory Visit: Payer: Self-pay

## 2018-12-26 ENCOUNTER — Emergency Department: Payer: Medicare Other

## 2018-12-26 ENCOUNTER — Encounter: Payer: Self-pay | Admitting: Emergency Medicine

## 2018-12-26 DIAGNOSIS — E872 Acidosis: Secondary | ICD-10-CM | POA: Diagnosis present

## 2018-12-26 DIAGNOSIS — Z8249 Family history of ischemic heart disease and other diseases of the circulatory system: Secondary | ICD-10-CM

## 2018-12-26 DIAGNOSIS — M5136 Other intervertebral disc degeneration, lumbar region: Secondary | ICD-10-CM | POA: Diagnosis present

## 2018-12-26 DIAGNOSIS — L0390 Cellulitis, unspecified: Secondary | ICD-10-CM | POA: Diagnosis present

## 2018-12-26 DIAGNOSIS — M25552 Pain in left hip: Secondary | ICD-10-CM | POA: Diagnosis present

## 2018-12-26 DIAGNOSIS — Z833 Family history of diabetes mellitus: Secondary | ICD-10-CM

## 2018-12-26 DIAGNOSIS — Z66 Do not resuscitate: Secondary | ICD-10-CM | POA: Diagnosis present

## 2018-12-26 DIAGNOSIS — Z20828 Contact with and (suspected) exposure to other viral communicable diseases: Secondary | ICD-10-CM | POA: Diagnosis present

## 2018-12-26 DIAGNOSIS — Z87891 Personal history of nicotine dependence: Secondary | ICD-10-CM

## 2018-12-26 DIAGNOSIS — M79604 Pain in right leg: Secondary | ICD-10-CM | POA: Diagnosis present

## 2018-12-26 DIAGNOSIS — R5381 Other malaise: Secondary | ICD-10-CM | POA: Diagnosis present

## 2018-12-26 DIAGNOSIS — Z823 Family history of stroke: Secondary | ICD-10-CM

## 2018-12-26 DIAGNOSIS — L03116 Cellulitis of left lower limb: Secondary | ICD-10-CM | POA: Diagnosis not present

## 2018-12-26 DIAGNOSIS — Z91048 Other nonmedicinal substance allergy status: Secondary | ICD-10-CM

## 2018-12-26 DIAGNOSIS — E1151 Type 2 diabetes mellitus with diabetic peripheral angiopathy without gangrene: Secondary | ICD-10-CM | POA: Diagnosis present

## 2018-12-26 DIAGNOSIS — K5790 Diverticulosis of intestine, part unspecified, without perforation or abscess without bleeding: Secondary | ICD-10-CM | POA: Diagnosis present

## 2018-12-26 DIAGNOSIS — Z800 Family history of malignant neoplasm of digestive organs: Secondary | ICD-10-CM

## 2018-12-26 DIAGNOSIS — E876 Hypokalemia: Secondary | ICD-10-CM | POA: Diagnosis present

## 2018-12-26 DIAGNOSIS — Z85828 Personal history of other malignant neoplasm of skin: Secondary | ICD-10-CM

## 2018-12-26 DIAGNOSIS — Z8673 Personal history of transient ischemic attack (TIA), and cerebral infarction without residual deficits: Secondary | ICD-10-CM

## 2018-12-26 DIAGNOSIS — Z79899 Other long term (current) drug therapy: Secondary | ICD-10-CM

## 2018-12-26 DIAGNOSIS — Z791 Long term (current) use of non-steroidal anti-inflammatories (NSAID): Secondary | ICD-10-CM

## 2018-12-26 DIAGNOSIS — Z8042 Family history of malignant neoplasm of prostate: Secondary | ICD-10-CM

## 2018-12-26 DIAGNOSIS — E785 Hyperlipidemia, unspecified: Secondary | ICD-10-CM | POA: Diagnosis present

## 2018-12-26 DIAGNOSIS — M1990 Unspecified osteoarthritis, unspecified site: Secondary | ICD-10-CM | POA: Diagnosis present

## 2018-12-26 DIAGNOSIS — M25551 Pain in right hip: Secondary | ICD-10-CM | POA: Diagnosis present

## 2018-12-26 DIAGNOSIS — E860 Dehydration: Secondary | ICD-10-CM | POA: Diagnosis present

## 2018-12-26 DIAGNOSIS — Z7902 Long term (current) use of antithrombotics/antiplatelets: Secondary | ICD-10-CM

## 2018-12-26 DIAGNOSIS — I10 Essential (primary) hypertension: Secondary | ICD-10-CM | POA: Diagnosis present

## 2018-12-26 LAB — COMPREHENSIVE METABOLIC PANEL
ALT: 17 U/L (ref 0–44)
AST: 19 U/L (ref 15–41)
Albumin: 4.5 g/dL (ref 3.5–5.0)
Alkaline Phosphatase: 65 U/L (ref 38–126)
Anion gap: 10 (ref 5–15)
BUN: 14 mg/dL (ref 8–23)
CO2: 24 mmol/L (ref 22–32)
Calcium: 9.5 mg/dL (ref 8.9–10.3)
Chloride: 105 mmol/L (ref 98–111)
Creatinine, Ser: 0.69 mg/dL (ref 0.44–1.00)
GFR calc Af Amer: >60 mL/min (ref >60)
GFR calc non Af Amer: >60 mL/min (ref >60)
Glucose, Bld: 166 mg/dL — ABNORMAL HIGH (ref 70–99)
Potassium: 3.7 mmol/L (ref 3.5–5.1)
Sodium: 139 mmol/L (ref 135–145)
Total Bilirubin: 0.7 mg/dL (ref 0.3–1.2)
Total Protein: 7.2 g/dL (ref 6.5–8.1)

## 2018-12-26 LAB — CBC WITH DIFFERENTIAL/PLATELET
Abs Immature Granulocytes: 0.03 10*3/uL (ref 0.00–0.07)
Basophils Absolute: 0.1 10*3/uL (ref 0.0–0.1)
Basophils Relative: 1 %
Eosinophils Absolute: 0.2 10*3/uL (ref 0.0–0.5)
Eosinophils Relative: 2 %
HCT: 45.9 % (ref 36.0–46.0)
Hemoglobin: 15.6 g/dL — ABNORMAL HIGH (ref 12.0–15.0)
Immature Granulocytes: 0 %
Lymphocytes Relative: 26 %
Lymphs Abs: 2 10*3/uL (ref 0.7–4.0)
MCH: 29.3 pg (ref 26.0–34.0)
MCHC: 34 g/dL (ref 30.0–36.0)
MCV: 86.1 fL (ref 80.0–100.0)
Monocytes Absolute: 0.6 10*3/uL (ref 0.1–1.0)
Monocytes Relative: 7 %
Neutro Abs: 5 10*3/uL (ref 1.7–7.7)
Neutrophils Relative %: 64 %
Platelets: 232 10*3/uL (ref 150–400)
RBC: 5.33 MIL/uL — ABNORMAL HIGH (ref 3.87–5.11)
RDW: 13.2 % (ref 11.5–15.5)
WBC: 7.8 10*3/uL (ref 4.0–10.5)
nRBC: 0 % (ref 0.0–0.2)

## 2018-12-26 LAB — URINALYSIS, COMPLETE (UACMP) WITH MICROSCOPIC
Bacteria, UA: NONE SEEN
Bilirubin Urine: NEGATIVE
Glucose, UA: NEGATIVE mg/dL
Hgb urine dipstick: NEGATIVE
Ketones, ur: NEGATIVE mg/dL
Leukocytes,Ua: NEGATIVE
Nitrite: NEGATIVE
Protein, ur: NEGATIVE mg/dL
Specific Gravity, Urine: 1.006 (ref 1.005–1.030)
pH: 5 (ref 5.0–8.0)

## 2018-12-26 LAB — LACTIC ACID, PLASMA
Lactic Acid, Venous: 2.6 mmol/L (ref 0.5–1.9)
Lactic Acid, Venous: 2.1 mmol/L (ref 0.5–1.9)

## 2018-12-26 LAB — SARS CORONAVIRUS 2 BY RT PCR (HOSPITAL ORDER, PERFORMED IN ~~LOC~~ HOSPITAL LAB): SARS Coronavirus 2: NEGATIVE

## 2018-12-26 MED ORDER — SODIUM CHLORIDE 0.9 % IV BOLUS
1000.0000 mL | Freq: Once | INTRAVENOUS | Status: AC
  Administered 2018-12-26: 1000 mL via INTRAVENOUS

## 2018-12-26 MED ORDER — SODIUM CHLORIDE 0.9% FLUSH
3.0000 mL | Freq: Once | INTRAVENOUS | Status: AC
  Administered 2018-12-30: 3 mL via INTRAVENOUS

## 2018-12-26 MED ORDER — VANCOMYCIN HCL 1.25 G IV SOLR
1250.0000 mg | Freq: Once | INTRAVENOUS | Status: AC
  Administered 2018-12-26: 1250 mg via INTRAVENOUS
  Filled 2018-12-26: qty 1250

## 2018-12-26 NOTE — ED Notes (Signed)
Pt to bathroom with assistance by this tech and pt personal walker

## 2018-12-26 NOTE — ED Triage Notes (Signed)
PT c/o fall on 9/1 and was c/o abrasion on LFT leg getting worse. Pt was treated from walk in clinic with no improvement. Pt has noted eschar and redness around wound. Wound is closed. Denies any drainage or fever. Pt ambulatory with NAD

## 2018-12-26 NOTE — ED Notes (Signed)
Charge nurse notified of pt's lactic; bed requested for further eval

## 2018-12-26 NOTE — ED Provider Notes (Signed)
[facility] Emergency Department Provider Note  ____________________________________________   First MD Initiated Contact with Patient 12/26/18 2037     (approximate)  I have reviewed the triage vital signs and the nursing notes.   HISTORY  Chief Complaint Wound Infection    HPI Claire Washington is a 85 y.o. female with prior embolic stroke who presents with left leg pain.  Patient on 9/1 had a fall.  Pt had an abrasion to her lower leg.  Patient said that she is unsure when she noted the redness.  Because she had wrapped her leg up.  However for the past week she has been on ciprofloxacin without any help but this was for a UTI and then her doctor started her on doxycycline 3 days ago and is noted the redness and pain has gotten worse.  Patient's pain is moderate, constant, nothing makes it better, nothing makes it worse.     Past Medical History:  Diagnosis Date   Arthritis    Basal cell carcinoma    basel cell   Chronic cystitis    Diabetes mellitus type 2, controlled, without complications ([place])    type 2 or unspecified type diabetes mellitus without mention of complication, not stated as uncontrolled   Disease of thyroid gland    Embolic stroke ([place])    a. 07/2016 MRI Brain: small right superior cerebellum and left lateral occipital cortex infarcts;  b. 08/2016 Echo: EF 60-65%, Gr1 DD, mild MR;  c. 08/2016 30 Day Event Monitor: No significant tachy/bradyarrhythmias. No afib.   Follicular neoplasm of thyroid    Hemorrhage of rectum and anus    Hyperlipidemia    Hyperlipidemia, unspecified    Hypertension    Leg pain    Osteoarthritis    Pernicious anemia    Small vessel disease ([place])    Spinal stenosis, lumbar region without neurogenic claudication    Subdural hematoma ([place])    a. 2013 in setting of syncope.   Syncope and collapse    a. 2013 - syncope and fall w/ resultant subdural hematoma; b. prev nl echo and stress test  @ [facility].   Thyroid nodule    Vertigo & Chronic Dizziness    a. Previously seen by ENT with Physical Therapy.  MRI 2000 small vessel disease    Patient Active Problem List   Diagnosis Date Noted   Carotid stenosis 12/28/2017   Vertigo 11/30/2017   Gait instability 10/19/2016   Dizziness 11/03/2015   Hypertension 12/04/2011   Hyperlipidemia 12/04/2011   Syncope 12/04/2011   Diabetes mellitus ([place]) 12/04/2011   Smoking hx 12/04/2011    Past Surgical History:  Procedure Laterality Date   BILATERAL SALPINGOOPHORECTOMY     COLONOSCOPY  01/26/2009   Diverticulosis   CYSTOSCOPY     HEMORRHOIDECTOMY BY SIMPLE LIGATION     PR COLONOSCOPY FLX W/ENDOSCOPIC MUCOSAL RESECTION  09/02/2014   Procedure: COLONOSCOPY, FLEXIBLE; WITH ENDOSCOPIC MUCOSAL RESECTION; Surgeon: Saunders Revel, MD; [facility]; Service: Gastroenterology   PR [place] FLX W/RMVL OF TUMOR POLYP LESION [place]  09/02/2014   Procedure: COLONOSCOPY FLEX; W/REMOV TUMOR/LES BY SNARE; Surgeon: Saunders Revel, MD; Location: [facility]; Service: Gastroenterology   SKIN SURGERY     TOTAL ABDOMINAL HYSTERECTOMY W/ BILATERAL SALPINGOOPHORECTOMY  1970s    Prior to Admission medications   Medication Sig Start Date End Date Taking? Authorizing Provider  amLODipine (NORVASC) 5 MG tablet Take 1 tablet (5 mg total) by  mouth 2 (two) times daily. 07/02/18 09/30/18  Minna Merritts, MD  atorvastatin (LIPITOR) 10 MG tablet TAKE 1 TABLET BY MOUTH EVERY DAY 04/11/18   Minna Merritts, MD  clopidogrel (PLAVIX) 75 MG tablet TAKE 1 TABLET (75 MG TOTAL) BY MOUTH DAILY. 07/25/18   Minna Merritts, MD  diazepam (VALIUM) 2 MG tablet Take 1 tablet (2 mg total) by mouth 3 (three) times daily as needed (vertigo). 12/01/17   Demetrios Loll, MD  hydrALAZINE (APRESOLINE) 25 MG tablet Take 1 tablet (25 mg total) by mouth 3 (three) times daily as needed. As needed for systolic blood pressure  [id number]. 06/13/18 09/11/18  Dunn, Areta Haber, PA-C  metoprolol tartrate (LOPRESSOR) 25 MG tablet Take 0.5 tablets (12.5 mg total) by mouth 2 (two) times daily. 10/17/17   Theora Gianotti, NP  Multiple Vitamins-Minerals (CENTRUM SILVER PO) Take 1 tablet by mouth daily. Reported on 06/11/2015    [provider]  vitamin B-12 (CYANOCOBALAMIN) 1000 MCG tablet Take by mouth.    [provider]    Allergies Hydrocortisone, Sulfa antibiotics, and Tape  Family History  Problem Relation Age of Onset   Colon cancer Mother    Heart disease Father    Heart attack Father    Prostate cancer Father    Stroke Father    Heart attack Brother    Asthma Daughter    Diabetes Other        Siblings   Breast cancer Neg Hx     Social History Social History   Tobacco Use   Smoking status: Former Smoker    Packs/day: 0.25    Years: 20.00    Pack years: 5.00    Types: Cigarettes    Quit date: 09/15/1993    Years since quitting: 25.2   Smokeless tobacco: Never Used  Substance Use Topics   Alcohol use: Yes    Alcohol/week: 2.0 standard drinks    Types: 1 Glasses of wine, 1 Shots of liquor per week    Comment: occassional   Drug use: No      Review of Systems Constitutional: No fever/chills Eyes: No visual changes. ENT: No sore throat. Cardiovascular: Denies chest pain. Respiratory: Denies shortness of breath. Gastrointestinal: No abdominal pain.  No nausea, no vomiting.  No diarrhea.  No constipation. Genitourinary: Negative for dysuria. Musculoskeletal: Negative for back pain. Redness on the leg  Skin: Negative for rash. Neurological: Negative for headaches, focal weakness or numbness. All other ROS negative ____________________________________________   PHYSICAL EXAM:  VITAL SIGNS: ED Triage Vitals [12/26/18 1554]  Enc Vitals Group     BP (!) 161/63     Pulse Rate (!) 105     Resp 16     Temp 97.8 F (36.6 C)     Temp Source Oral     SpO2 100 %      Weight      Height      Head Circumference      Peak Flow      Pain Score 4     Pain Loc      Pain Edu?      Excl. in [place]?     Constitutional: Alert and oriented. Well appearing and in no acute distress. Eyes: Conjunctivae are normal. EOMI. Head: Atraumatic. Nose: No congestion/rhinnorhea. Mouth/Throat: Mucous membranes are moist.   Neck: No stridor. Trachea Midline. FROM Cardiovascular: tachycardiac, regular rhythm. Grossly normal heart sounds.  Good peripheral circulation. Respiratory: Normal respiratory effort.  No retractions. Lungs  CTAB. Gastrointestinal: Soft and nontender. No distention. No abdominal bruits.  Musculoskeletal: Abrasion of the left shin with about a supple size redness around it.  Good distal pulse. Neurologic:  Normal speech and language. No gross focal neurologic deficits are appreciated.  Skin:  Skin is warm, dry and intact. No rash noted. Psychiatric: Mood and affect are normal. Speech and behavior are normal. GU: Deferred   ____________________________________________   LABS (all labs ordered are listed, but only abnormal results are displayed)  Labs Reviewed  LACTIC ACID, PLASMA - Abnormal; Notable for the following components:      Result Value   Lactic Acid, Venous 2.6 (*)    All other components within normal limits  LACTIC ACID, PLASMA - Abnormal; Notable for the following components:   Lactic Acid, Venous 2.1 (*)    All other components within normal limits  COMPREHENSIVE METABOLIC PANEL - Abnormal; Notable for the following components:   Glucose, Bld 166 (*)    All other components within normal limits  CBC WITH DIFFERENTIAL/PLATELET - Abnormal; Notable for the following components:   RBC 5.33 (*)    Hemoglobin 15.6 (*)    All other components within normal limits  URINALYSIS, COMPLETE (UACMP) WITH MICROSCOPIC - Abnormal; Notable for the following components:   Color, Urine STRAW (*)    APPearance CLEAR (*)    All other components  within normal limits  SARS CORONAVIRUS 2 (HOSPITAL ORDER, [place] [facility])  CULTURE, BLOOD (ROUTINE X 2)  CULTURE, BLOOD (ROUTINE X 2)   ____________________________________________  RADIOLOGY Robert Bellow, personally viewed and evaluated these images (plain radiographs) as part of my medical decision making, as well as reviewing the written report by the radiologist.  ED MD interpretation: No fracture  Official radiology report(s): Dg Tibia/fibula Left  Result Date: 12/26/2018 CLINICAL DATA:  Abrasion EXAM: LEFT TIBIA AND FIBULA - 2 VIEW COMPARISON:  None. FINDINGS: There is soft tissue edema and skin thickening over the lateral aspect of the lower leg. No subjacent osseous injury. Vascular calcifications are noted in the soft tissues. Geometric radiodensities noted in the posterior soft tissues of the lower leg, could be vascular calcium versus benign soft tissue mineralization versus less likely foreign body given depth within the tissues. Mild degenerative features are seen at the ankle and knee IMPRESSION: Soft tissue edema and skin thickening over the lateral aspect of the lower leg without subjacent osseous injury. Radiodensity within the posterior soft tissues, likely vascular calcium or soft tissue mineralization, less likely foreign body. Electronically Signed   By: Lovena Le M.D.   On: 12/26/2018 21:27    ____________________________________________   PROCEDURES  Procedure(s) performed (including Critical Care):  Procedures   ____________________________________________   INITIAL IMPRESSION / ASSESSMENT AND PLAN / ED COURSE  Shakeria Opie was evaluated in Emergency Department on 12/26/2018 for the symptoms described in the history of present illness. She was evaluated in the context of the global COVID-19 pandemic, which necessitated consideration that the patient might be at risk for infection with the SARS-CoV-2 virus that causes  COVID-19. Institutional protocols and algorithms that pertain to the evaluation of patients at risk for COVID-19 are in a state of rapid change based on information released by regulatory bodies including the CDC and federal and state organizations. These policies and algorithms were followed during the patient's care in the ED.    Patient is a well-appearing 85 year old who lives by herself with increasing redness on her lower extremity  after a fall after a course of ciprofloxacin for UTI as well as 3 days of doxycycline.  This is most concerning for cellulitis.  No fluctuation to suggest abscess.  No crepitus or rapid progression to suggest necrotizing fasciitis.  Will get x-ray to rule out fracture but low suspicion.  Given patient has failed outpatient treatment will discuss with the hospital team for admission.  Given patient slightly tachycardic as well as has an elevated lactate we will give a liter of fluid and get blood cultures.  Will get coronavirus testing and admit patient to the hospital team.  Will start on vancomycin.        ____________________________________________   FINAL CLINICAL IMPRESSION(S) / ED DIAGNOSES   Final diagnoses:  Cellulitis of left lower extremity      MEDICATIONS GIVEN DURING THIS VISIT:  Medications  sodium chloride flush (NS) 0.9 % injection 3 mL (has no administration in time range)  sodium chloride 0.9 % bolus 1,000 mL (0 mLs Intravenous Stopped 12/26/18 2228)     ED Discharge Orders    None       Note:  This document was prepared using Dragon voice recognition software and may include unintentional dictation errors.   Vanessa [place], MD 12/26/18 2231

## 2018-12-26 NOTE — ED Notes (Signed)
Pt resting on stretcher with iv fluids infusing without difficulty. Pt alert and calm at this time with no acute distress noted.

## 2018-12-26 NOTE — ED Notes (Addendum)
Pt taking cipro for UTI and doxycycline for wound rx by walk in clinic last week

## 2018-12-26 NOTE — ED Notes (Signed)
Blue top sent , pt is on anticoags

## 2018-12-26 NOTE — Progress Notes (Signed)
PHARMACY -  BRIEF ANTIBIOTIC NOTE   Pharmacy has received consult(s) for Vancomycin from an ED provider.  The patient's profile has been reviewed for ht/wt/allergies/indication/available labs.    One time order(s) placed for Vancomycin 1250 mg IV X 1.   Further antibiotics/pharmacy consults should be ordered by admitting physician if indicated.                       Thank you, Kacy Conely D 12/26/2018  10:35 PM

## 2018-12-26 NOTE — ED Notes (Signed)
1 set of blood cultures collected, labeled, and sent to lab at this time.

## 2018-12-27 ENCOUNTER — Observation Stay: Payer: Medicare Other

## 2018-12-27 ENCOUNTER — Other Ambulatory Visit: Payer: Self-pay

## 2018-12-27 DIAGNOSIS — Z8673 Personal history of transient ischemic attack (TIA), and cerebral infarction without residual deficits: Secondary | ICD-10-CM | POA: Diagnosis not present

## 2018-12-27 DIAGNOSIS — Z66 Do not resuscitate: Secondary | ICD-10-CM | POA: Diagnosis present

## 2018-12-27 DIAGNOSIS — K5790 Diverticulosis of intestine, part unspecified, without perforation or abscess without bleeding: Secondary | ICD-10-CM | POA: Diagnosis present

## 2018-12-27 DIAGNOSIS — M5136 Other intervertebral disc degeneration, lumbar region: Secondary | ICD-10-CM | POA: Diagnosis present

## 2018-12-27 DIAGNOSIS — M79604 Pain in right leg: Secondary | ICD-10-CM | POA: Diagnosis present

## 2018-12-27 DIAGNOSIS — E860 Dehydration: Secondary | ICD-10-CM | POA: Diagnosis present

## 2018-12-27 DIAGNOSIS — L0390 Cellulitis, unspecified: Secondary | ICD-10-CM | POA: Diagnosis present

## 2018-12-27 DIAGNOSIS — M25552 Pain in left hip: Secondary | ICD-10-CM | POA: Diagnosis present

## 2018-12-27 DIAGNOSIS — Z8042 Family history of malignant neoplasm of prostate: Secondary | ICD-10-CM | POA: Diagnosis not present

## 2018-12-27 DIAGNOSIS — E876 Hypokalemia: Secondary | ICD-10-CM | POA: Diagnosis present

## 2018-12-27 DIAGNOSIS — Z791 Long term (current) use of non-steroidal anti-inflammatories (NSAID): Secondary | ICD-10-CM | POA: Diagnosis not present

## 2018-12-27 DIAGNOSIS — I10 Essential (primary) hypertension: Secondary | ICD-10-CM | POA: Diagnosis present

## 2018-12-27 DIAGNOSIS — Z823 Family history of stroke: Secondary | ICD-10-CM | POA: Diagnosis not present

## 2018-12-27 DIAGNOSIS — Z8249 Family history of ischemic heart disease and other diseases of the circulatory system: Secondary | ICD-10-CM | POA: Diagnosis not present

## 2018-12-27 DIAGNOSIS — R5381 Other malaise: Secondary | ICD-10-CM | POA: Diagnosis present

## 2018-12-27 DIAGNOSIS — Z800 Family history of malignant neoplasm of digestive organs: Secondary | ICD-10-CM | POA: Diagnosis not present

## 2018-12-27 DIAGNOSIS — Z87891 Personal history of nicotine dependence: Secondary | ICD-10-CM | POA: Diagnosis not present

## 2018-12-27 DIAGNOSIS — M1990 Unspecified osteoarthritis, unspecified site: Secondary | ICD-10-CM | POA: Diagnosis present

## 2018-12-27 DIAGNOSIS — Z7902 Long term (current) use of antithrombotics/antiplatelets: Secondary | ICD-10-CM | POA: Diagnosis not present

## 2018-12-27 DIAGNOSIS — E785 Hyperlipidemia, unspecified: Secondary | ICD-10-CM | POA: Diagnosis present

## 2018-12-27 DIAGNOSIS — Z20828 Contact with and (suspected) exposure to other viral communicable diseases: Secondary | ICD-10-CM | POA: Diagnosis present

## 2018-12-27 DIAGNOSIS — L03116 Cellulitis of left lower limb: Secondary | ICD-10-CM | POA: Diagnosis present

## 2018-12-27 DIAGNOSIS — M25551 Pain in right hip: Secondary | ICD-10-CM | POA: Diagnosis present

## 2018-12-27 DIAGNOSIS — E872 Acidosis: Secondary | ICD-10-CM | POA: Diagnosis present

## 2018-12-27 DIAGNOSIS — E1151 Type 2 diabetes mellitus with diabetic peripheral angiopathy without gangrene: Secondary | ICD-10-CM | POA: Diagnosis present

## 2018-12-27 LAB — TSH: TSH: 4.292 u[IU]/mL (ref 0.350–4.500)

## 2018-12-27 MED ORDER — DOCUSATE SODIUM 100 MG PO CAPS
100.0000 mg | ORAL_CAPSULE | Freq: Two times a day (BID) | ORAL | Status: DC
  Administered 2018-12-27 – 2018-12-30 (×5): 100 mg via ORAL
  Filled 2018-12-27 (×7): qty 1

## 2018-12-27 MED ORDER — VITAMIN B-12 1000 MCG PO TABS
1000.0000 ug | ORAL_TABLET | Freq: Every day | ORAL | Status: DC
  Administered 2018-12-27 – 2018-12-30 (×4): 1000 ug via ORAL
  Filled 2018-12-27 (×4): qty 1

## 2018-12-27 MED ORDER — ACETAMINOPHEN 500 MG PO TABS
1000.0000 mg | ORAL_TABLET | Freq: Once | ORAL | Status: AC
  Administered 2018-12-27: 1000 mg via ORAL
  Filled 2018-12-27: qty 2

## 2018-12-27 MED ORDER — TRAMADOL HCL 50 MG PO TABS
50.0000 mg | ORAL_TABLET | Freq: Four times a day (QID) | ORAL | Status: DC | PRN
  Administered 2018-12-27 – 2018-12-29 (×3): 50 mg via ORAL
  Filled 2018-12-27 (×3): qty 1

## 2018-12-27 MED ORDER — DIAZEPAM 2 MG PO TABS
2.0000 mg | ORAL_TABLET | Freq: Three times a day (TID) | ORAL | Status: DC | PRN
  Administered 2018-12-27: 2 mg via ORAL
  Filled 2018-12-27: qty 1

## 2018-12-27 MED ORDER — ENOXAPARIN SODIUM 40 MG/0.4ML ~~LOC~~ SOLN
40.0000 mg | SUBCUTANEOUS | Status: DC
  Administered 2018-12-27 – 2018-12-30 (×4): 40 mg via SUBCUTANEOUS
  Filled 2018-12-27 (×4): qty 0.4

## 2018-12-27 MED ORDER — SODIUM CHLORIDE 0.9 % IV SOLN
3.0000 g | Freq: Four times a day (QID) | INTRAVENOUS | Status: DC
  Administered 2018-12-27 – 2018-12-29 (×9): 3 g via INTRAVENOUS
  Filled 2018-12-27 (×4): qty 3
  Filled 2018-12-27 (×3): qty 8
  Filled 2018-12-27 (×2): qty 3
  Filled 2018-12-27 (×2): qty 8
  Filled 2018-12-27: qty 3

## 2018-12-27 MED ORDER — ACETAMINOPHEN 325 MG PO TABS: 650.0000 mg | ORAL_TABLET | Freq: Four times a day (QID) | ORAL | Status: DC | PRN

## 2018-12-27 MED ORDER — HYDRALAZINE HCL 25 MG PO TABS: 25.0000 mg | ORAL_TABLET | Freq: Three times a day (TID) | ORAL | Status: DC | PRN

## 2018-12-27 MED ORDER — ONDANSETRON HCL 4 MG/2ML IJ SOLN: 4.0000 mg | Freq: Four times a day (QID) | INTRAMUSCULAR | Status: DC | PRN

## 2018-12-27 MED ORDER — CLOPIDOGREL BISULFATE 75 MG PO TABS
75.0000 mg | ORAL_TABLET | Freq: Every day | ORAL | Status: DC
  Administered 2018-12-27 – 2018-12-30 (×4): 75 mg via ORAL
  Filled 2018-12-27 (×4): qty 1

## 2018-12-27 MED ORDER — ONDANSETRON HCL 4 MG PO TABS: 4.0000 mg | ORAL_TABLET | Freq: Four times a day (QID) | ORAL | Status: DC | PRN

## 2018-12-27 MED ORDER — AMLODIPINE BESYLATE 5 MG PO TABS
5.0000 mg | ORAL_TABLET | Freq: Two times a day (BID) | ORAL | Status: DC
  Administered 2018-12-27 – 2018-12-30 (×8): 5 mg via ORAL
  Filled 2018-12-27 (×8): qty 1

## 2018-12-27 MED ORDER — MORPHINE SULFATE (PF) 2 MG/ML IV SOLN
1.0000 mg | Freq: Once | INTRAVENOUS | Status: AC
  Administered 2018-12-27: 1 mg via INTRAVENOUS
  Filled 2018-12-27: qty 1

## 2018-12-27 MED ORDER — VITAMIN D (ERGOCALCIFEROL) 1.25 MG (50000 UNIT) PO CAPS
50000.0000 [IU] | ORAL_CAPSULE | ORAL | Status: DC
  Administered 2018-12-29: 50000 [IU] via ORAL
  Filled 2018-12-27: qty 1

## 2018-12-27 MED ORDER — ATORVASTATIN CALCIUM 10 MG PO TABS
10.0000 mg | ORAL_TABLET | Freq: Every day | ORAL | Status: DC
  Administered 2018-12-27 – 2018-12-29 (×3): 10 mg via ORAL
  Filled 2018-12-27 (×3): qty 1

## 2018-12-27 MED ORDER — METOPROLOL TARTRATE 25 MG PO TABS
12.5000 mg | ORAL_TABLET | Freq: Two times a day (BID) | ORAL | Status: DC
  Administered 2018-12-27 – 2018-12-30 (×8): 12.5 mg via ORAL
  Filled 2018-12-27 (×8): qty 1

## 2018-12-27 MED ORDER — VANCOMYCIN HCL 10 G IV SOLR: 1250.0000 mg | INTRAVENOUS | Status: DC

## 2018-12-27 MED ORDER — ACETAMINOPHEN 650 MG RE SUPP: 650.0000 mg | Freq: Four times a day (QID) | RECTAL | Status: DC | PRN

## 2018-12-27 MED ORDER — CYCLOBENZAPRINE HCL 10 MG PO TABS
10.0000 mg | ORAL_TABLET | Freq: Three times a day (TID) | ORAL | Status: DC | PRN
  Administered 2018-12-27 (×2): 10 mg via ORAL
  Filled 2018-12-27 (×2): qty 1

## 2018-12-27 MED ORDER — SODIUM CHLORIDE 0.9 % IV SOLN
INTRAVENOUS | Status: DC
  Administered 2018-12-27 – 2018-12-29 (×4): via INTRAVENOUS

## 2018-12-27 NOTE — Progress Notes (Signed)
[place] at [facility]                                                                                                                                                                                  Patient Demographics   Claire Washington, is a 85 y.o. female, DOB - June 05, 1924, [id number]  Admit date - 12/26/2018   Admitting Physician Harrie Foreman, MD  Outpatient Primary MD for the patient is Tracie Harrier, MD   LOS - 0  Subjective: Complains of pain in both of her hips states that this pain started yesterday. So noticed to have cellulitis in the left leg.   Review of Systems:   CONSTITUTIONAL: No documented fever. No fatigue, weakness. No weight gain, no weight loss.  EYES: No blurry or double vision.  ENT: No tinnitus. No postnasal drip. No redness of the oropharynx.  RESPIRATORY: No cough, no wheeze, no hemoptysis. No dyspnea.  CARDIOVASCULAR: No chest pain. No orthopnea. No palpitations. No syncope.  GASTROINTESTINAL: No nausea, no vomiting or diarrhea. No abdominal pain. No melena or hematochezia.  GENITOURINARY: No dysuria or hematuria.  ENDOCRINE: No polyuria or nocturia. No heat or cold intolerance.  HEMATOLOGY: No anemia. No bruising. No bleeding.  INTEGUMENTARY: No rashes. No lesions.  MUSCULOSKELETAL: Positive arthritis. No swelling. No gout.  NEUROLOGIC: No numbness, tingling, or ataxia. No seizure-type activity.  PSYCHIATRIC: No anxiety. No insomnia. No ADD.    Vitals:   Vitals:   12/27/18 0200 12/27/18 0242 12/27/18 0503 12/27/18 0808  BP: (!) 159/98 (!) 145/74 132/62 (!) 146/67  Pulse: (!) 115 (!) 103 63 75  Resp: 20   19  Temp:  97.7 F (36.5 C) (!) 97.5 F (36.4 C) 97.7 F (36.5 C)  TempSrc:  Oral Oral Oral  SpO2: 95% 98% 96% 95%  Weight:  53.6 kg    Height:  5\' 1"  (1.549 m)      Wt Readings from Last 3 Encounters:  12/27/18 53.6 kg  07/17/18 54.6 kg  06/13/18 55.3 kg     Intake/Output Summary (Last 24  hours) at 12/27/2018 1312 Last data filed at 12/27/2018 1022 Gross per 24 hour  Intake 316.87 ml  Output -  Net 316.87 ml    Physical Exam:   GENERAL: Pleasant-appearing in no apparent distress.  HEAD, EYES, EARS, NOSE AND THROAT: Atraumatic, normocephalic. Extraocular muscles are intact. Pupils equal and reactive to light. Sclerae anicteric. No conjunctival injection. No oro-pharyngeal erythema.  NECK: Supple. There is no jugular venous distention. No bruits, no lymphadenopathy, no thyromegaly.  HEART: Regular rate and rhythm,. No murmurs, no rubs, no clicks.  LUNGS: Clear to auscultation bilaterally. No rales  or rhonchi. No wheezes.  ABDOMEN: Soft, flat, nontender, nondistended. Has good bowel sounds. No hepatosplenomegaly appreciated.  EXTREMITIES: No evidence of any cyanosis, clubbing, or peripheral edema.  +2 pedal and radial pulses bilaterally.  NEUROLOGIC: The patient is alert, awake, and oriented x3 with no focal motor or sensory deficits appreciated bilaterally.  SKIN: Moist and warm with no rashes appreciated.  Psych: Not anxious, depressed LN: No inguinal LN enlargement    Antibiotics   Anti-infectives (From admission, onward)   Start     Dose/Rate Route Frequency Ordered Stop   12/28/18 2200  vancomycin (VANCOCIN) 1,250 mg in sodium chloride 0.9 % 250 mL IVPB  Status:  Discontinued     1,250 mg 166.7 mL/hr over 90 Minutes Intravenous Every 48 hours 12/27/18 0749 12/27/18 1206   12/27/18 1300  Ampicillin-Sulbactam (UNASYN) 3 g in sodium chloride 0.9 % 100 mL IVPB     3 g 200 mL/hr over 30 Minutes Intravenous Every 6 hours 12/27/18 1208     12/26/18 2245  vancomycin (VANCOCIN) 1,250 mg in sodium chloride 0.9 % 250 mL IVPB     1,250 mg 166.7 mL/hr over 90 Minutes Intravenous  Once 12/26/18 2235 12/27/18 0114      Medications   Scheduled Meds: . amLODipine  5 mg Oral BID  . atorvastatin  10 mg Oral Daily  . clopidogrel  75 mg Oral Daily  . docusate sodium  100 mg  Oral BID  . enoxaparin (LOVENOX) injection  40 mg Subcutaneous Q24H  . metoprolol tartrate  12.5 mg Oral BID  . sodium chloride flush  3 mL Intravenous Once  . vitamin B-12  1,000 mcg Oral Daily  . [START ON 12/29/2018] Vitamin D (Ergocalciferol)  50,000 Units Oral Q7 days   Continuous Infusions: . sodium chloride 75 mL/hr at 12/27/18 0400  . ampicillin-sulbactam (UNASYN) IV     PRN Meds:.acetaminophen **OR** acetaminophen, cyclobenzaprine, diazepam, hydrALAZINE, ondansetron **OR** ondansetron (ZOFRAN) IV, traMADol   Data Review:   Micro Results Recent Results (from the past 240 hour(s))  Blood culture (routine x 2)     Status: None (Preliminary result)   Collection Time: 12/26/18  8:55 PM   Specimen: BLOOD  Result Value Ref Range Status   Specimen Description BLOOD RIGHT ANTECUBITAL  Final   Special Requests   Final    BOTTLES DRAWN AEROBIC AND ANAEROBIC Blood Culture adequate volume   Culture   Final    NO GROWTH < 12 HOURS Performed at [facility], [address], [place], [place] [address]    Report Status PENDING  Incomplete  SARS Coronavirus 2 [facility] order, Performed in [facility] hospital lab) Nasopharyngeal Nasopharyngeal Swab     Status: None   Collection Time: 12/26/18  9:03 PM   Specimen: Nasopharyngeal Swab  Result Value Ref Range Status   SARS Coronavirus 2 NEGATIVE NEGATIVE Final    Comment: (NOTE) If result is NEGATIVE SARS-CoV-2 target nucleic acids are NOT DETECTED. The SARS-CoV-2 RNA is generally detectable in upper and lower  respiratory specimens during the acute phase of infection. The lowest  concentration of SARS-CoV-2 viral copies this assay can detect is 250  copies / mL. A negative result does not preclude SARS-CoV-2 infection  and should not be used as the sole basis for treatment or other  patient management decisions.  A negative result may occur with  improper specimen collection / handling, submission of specimen other  than  nasopharyngeal swab, presence of viral mutation(s) within the  areas targeted by this assay, and inadequate number of viral copies  (<250 copies / mL). A negative result must be combined with clinical  observations, patient history, and epidemiological information. If result is POSITIVE SARS-CoV-2 target nucleic acids are DETECTED. The SARS-CoV-2 RNA is generally detectable in upper and lower  respiratory specimens dur ing the acute phase of infection.  Positive  results are indicative of active infection with SARS-CoV-2.  Clinical  correlation with patient history and other diagnostic information is  necessary to determine patient infection status.  Positive results do  not rule out bacterial infection or co-infection with other viruses. If result is PRESUMPTIVE POSTIVE SARS-CoV-2 nucleic acids MAY BE PRESENT.   A presumptive positive result was obtained on the submitted specimen  and confirmed on repeat testing.  While 2019 novel coronavirus  (SARS-CoV-2) nucleic acids may be present in the submitted sample  additional confirmatory testing may be necessary for epidemiological  and / or clinical management purposes  to differentiate between  SARS-CoV-2 and other Sarbecovirus currently known to infect humans.  If clinically indicated additional testing with an alternate test  methodology [phone]) is advised. The SARS-CoV-2 RNA is generally  detectable in upper and lower respiratory sp ecimens during the acute  phase of infection. The expected result is Negative. Fact Sheet for Patients:  StrictlyIdeas.no Fact Sheet for Healthcare Providers: BankingDealers.co.za This test is not yet approved or cleared by the [place] FDA and has been authorized for detection and/or diagnosis of SARS-CoV-2 by FDA under an Emergency Use Authorization (EUA).  This EUA will remain in effect (meaning this test can be used) for the duration of  the COVID-19 declaration under Section 564(b)(1) of the Act, 21 U.S.C. section 360bbb-3(b)(1), unless the authorization is terminated or revoked sooner. Performed at [facility], [address] [place], [place] [address]   Blood culture (routine x 2)     Status: None (Preliminary result)   Collection Time: 12/26/18 11:33 PM   Specimen: BLOOD  Result Value Ref Range Status   Specimen Description BLOOD RIGHT ANTECUBITAL  Final   Special Requests   Final    BOTTLES DRAWN AEROBIC AND ANAEROBIC Blood Culture results may not be optimal due to an excessive volume of blood received in culture bottles   Culture   Final    NO GROWTH < 12 HOURS Performed at [facility], [address] [place], [place] [address]    Report Status PENDING  Incomplete    Radiology Reports Dg Tibia/fibula Left  Result Date: 12/26/2018 CLINICAL DATA:  Abrasion EXAM: LEFT TIBIA AND FIBULA - 2 VIEW COMPARISON:  None. FINDINGS: There is soft tissue edema and skin thickening over the lateral aspect of the lower leg. No subjacent osseous injury. Vascular calcifications are noted in the soft tissues. Geometric radiodensities noted in the posterior soft tissues of the lower leg, could be vascular calcium versus benign soft tissue mineralization versus less likely foreign body given depth within the tissues. Mild degenerative features are seen at the ankle and knee IMPRESSION: Soft tissue edema and skin thickening over the lateral aspect of the lower leg without subjacent osseous injury. Radiodensity within the posterior soft tissues, likely vascular calcium or soft tissue mineralization, less likely foreign body. Electronically Signed   By: Lovena Le M.D.   On: 12/26/2018 21:27   Ct Pelvis Wo Contrast  Result Date: 12/27/2018 CLINICAL DATA:  Recent fall, pelvic pain EXAM: CT PELVIS WITHOUT CONTRAST TECHNIQUE: Multidetector CT imaging of the pelvis  was performed following the standard protocol without  intravenous contrast. Sagittal and coronal MPR images reconstructed from axial data set. COMPARISON:  CT abdomen and pelvis 12/29/2014 FINDINGS: Urinary Tract: Well distended bladder without obvious mass. Distal ureters decompressed. Bowel: Sigmoid diverticulosis. Visualized large and small bowel loops otherwise unremarkable Vascular/Lymphatic: Atherosclerotic calcifications of aortic bifurcation and iliac arteries. No adenopathy. Reproductive: Uterus surgically absent with nonvisualization of ovaries Other:  No free air or free fluid.  No hernia. Musculoskeletal: Diffuse osseous demineralization. Facet degenerative changes at visualized lumbar spine. SI joints preserved. No fracture, dislocation, or bone destruction. Grade 1 anterolisthesis L4-L5 due to a combination of facet and disc degenerative changes. IMPRESSION: Osseous demineralization with degenerative disc/facet disease changes at lower lumbar spine. No acute osseous abnormalities. Sigmoid diverticulosis. Electronically Signed   By: Lavonia Dana M.D.   On: 12/27/2018 11:50     CBC Recent Labs  Lab 12/26/18 1556  WBC 7.8  HGB 15.6*  HCT 45.9  PLT 232  MCV 86.1  MCH 29.3  MCHC 34.0  RDW 13.2  LYMPHSABS 2.0  MONOABS 0.6  EOSABS 0.2  BASOSABS 0.1    Chemistries  Recent Labs  Lab 12/26/18 1556  NA 139  K 3.7  CL 105  CO2 24  GLUCOSE 166*  BUN 14  CREATININE 0.69  CALCIUM 9.5  AST 19  ALT 17  ALKPHOS 65  BILITOT 0.7   ------------------------------------------------------------------------------------------------------------------ estimated creatinine clearance is 33.9 mL/min (by C-G formula based on SCr of 0.69 mg/dL). ------------------------------------------------------------------------------------------------------------------ No results for input(s): HGBA1C in the last 72 hours. ------------------------------------------------------------------------------------------------------------------ No results for  input(s): CHOL, HDL, LDLCALC, TRIG, CHOLHDL, LDLDIRECT in the last 72 hours. ------------------------------------------------------------------------------------------------------------------ Recent Labs    12/26/18 1556  TSH 4.292   ------------------------------------------------------------------------------------------------------------------ No results for input(s): VITAMINB12, FOLATE, FERRITIN, TIBC, IRON, RETICCTPCT in the last 72 hours.  Coagulation profile No results for input(s): INR, PROTIME in the last 168 hours.  No results for input(s): DDIMER in the last 72 hours.  Cardiac Enzymes No results for input(s): CKMB, TROPONINI, MYOGLOBIN in the last 168 hours.  Invalid input(s): CK ------------------------------------------------------------------------------------------------------------------ Invalid input(s): [place]   This is a 85 year old female admitted for cellulitis. 1.    Left leg cellulitis: Patient only on vancomycin continue IV Unasyn 2.  Dehydration: Continue IV fluids. 3.  Hypertension: Uncontrolled; continue amlodipine and metoprolol.  Hydralazine as needed 4.  Hyperlipidemia: Continue statin therapy 5.  Cerebrovascular disease continue plavix 6.  Bilateral hip pain I will obtain a CT scan of the pelvis make sure she does not have hip fracture we will try Ultram for pain She will need PT evaluation     Code Status Orders  (From admission, onward)         Start     Ordered   12/27/18 1029  Do not attempt resuscitation (DNR)  Continuous    Question Answer Comment  In the event of cardiac or respiratory ARREST Do not call a "code blue"   In the event of cardiac or respiratory ARREST Do not perform Intubation, CPR, defibrillation or ACLS   In the event of cardiac or respiratory ARREST Use medication by any route, position, wound care, and other measures to relive pain and suffering. May use oxygen, suction and manual treatment of  airway obstruction as needed for comfort.      12/27/18 1028        Code Status History    Date Active Date Inactive Code Status Order  ID Comments User Context   12/27/2018 0240 12/27/2018 1028 Full Code [id number]  Harrie Foreman, MD Inpatient   11/30/2017 1731 12/01/2017 1719 Full Code [id number]  Demetrios Loll, MD Inpatient   10/19/2016 2251 10/23/2016 1717 Full Code [id number]  Hugelmeyer, Ubaldo Glassing, DO Inpatient   Advance Care Planning Activity    Advance Directive Documentation     Most Recent Value  Type of Advance Directive  Healthcare Power of Attorney  Pre-existing out of facility DNR order (yellow form or pink MOST form)  -  "MOST" Form in Place?  -           Consults none  DVT Prophylaxis  Lovenox  Lab Results  Component Value Date   PLT 232 12/26/2018     Time Spent in minutes   45 minutes  Greater than 50% of time spent in care coordination and counseling patient regarding the condition and plan of care.   Dustin Flock M.D on 12/27/2018 at 1:12 PM  Between 7am to 6pm - Pager - [phone]  After 6pm go to [email or web address] - Proofreader  [employer]   Office  [phone]

## 2018-12-27 NOTE — TOC Initial Note (Signed)
Transition of Care [facility]) - Initial/Assessment Note    Patient Details  Name: Claire Washington MRN: [id number] Date of Birth: 10/18/1924  Transition of Care [facility]) CM/SW Contact:    Elza Rafter, RN Phone Number: 12/27/2018, 3:15 PM  Clinical Narrative:  Patient is from home alone.  Admitted with left lower extremity cellulitis from a recent fall.  Receiving IV antibiotics.  She is complaining of pain in her left leg and groin.  Current with Dr. Ginette Pitman for PCP.  [facility] [address].  She uses a walker full time at home. [place] in all adl's.   Offered home health services and she would like to use [place].  Referral made to [facility].  Will notify him when patient discharges.   No DME needs.  Will continue to follow and assist with progression of patient.                   Expected Discharge Plan: [place] Barriers to Discharge: Continued Medical Work up   Patient Goals and CMS Choice Patient states their goals for this hospitalization and ongoing recovery are:: To not hurt anymore and go home CMS Medicare.gov Compare Post Acute Care list provided to:: Patient Choice offered to / list presented to : Patient  Expected Discharge Plan and Services Expected Discharge Plan: [place]   Discharge Planning Services: CM Consult Post Acute Care Choice: [place] arrangements for the past 2 months: [place] Arranged: RN, PT [facility] Agency: [place] ([facility]) Date [place]: 12/27/18 Time [place]: [place] Representative spoke with at [place]: [name]  Prior Living Arrangements/Services Living arrangements for the past 2 months: [place] with:: Self   Do you feel safe going back to the place where you live?: Yes            Criminal Activity/Legal Involvement Pertinent to Current Situation/Hospitalization: No - Comment  as needed  Activities of Daily Living Home Assistive Devices/Equipment: Environmental consultant (specify type), Shower chair with back, Raised toilet seat with rails, Grab bars in shower, Grab bars around toilet ADL Screening (condition at time of admission) Patient's cognitive ability adequate to safely complete daily activities?: Yes Is the patient deaf or have difficulty hearing?: Yes Does the patient have difficulty seeing, even when wearing glasses/contacts?: No Does the patient have difficulty concentrating, remembering, or making decisions?: No Patient able to express need for assistance with ADLs?: Yes Does the patient have difficulty dressing or bathing?: No Independently performs ADLs?: Yes (appropriate for developmental age) Does the patient have difficulty walking or climbing stairs?: No Weakness of Legs: None Weakness of Arms/Hands: None  Permission Sought/Granted Permission sought to share information with : Chartered certified accountant granted to share information with : Yes, Verbal Permission Granted  Share Information with NAME: [name]  Permission granted to share info w AGENCY: [facility]        Emotional Assessment Appearance:: Appears younger than stated age Attitude/Demeanor/Rapport: Gracious Affect (typically observed): Accepting Orientation: : Oriented to Self, Oriented to Place, Oriented to  Time, Oriented to Situation Alcohol / Substance Use: Not Applicable Psych Involvement: No (comment)  Admission diagnosis:  Cellulitis of left lower extremity [L03.116] Patient Active Problem List   Diagnosis Date Noted  .  Cellulitis 12/27/2018  . Carotid stenosis 12/28/2017  . Vertigo 11/30/2017  . Gait instability 10/19/2016  . Dizziness 11/03/2015  . Hypertension 12/04/2011  . Hyperlipidemia 12/04/2011  . Syncope 12/04/2011  . Diabetes mellitus ([place]) 12/04/2011  . Smoking hx 12/04/2011   PCP:  Tracie Harrier, MD Pharmacy:   [id number]  [place], [place] [address] [place] [address] Phone: [phone] Fax: [phone]     Social Determinants of Health (SDOH) Interventions    Readmission Risk Interventions No flowsheet data found.

## 2018-12-27 NOTE — Plan of Care (Signed)

## 2018-12-27 NOTE — Consult Note (Signed)
Pharmacy Antibiotic Note  Claire Washington is a 85 y.o. female admitted on 12/26/2018 with wound infection.  Pharmacy has been consulted for Vancomycin dosing.  Plan: Patient received Vancomycin 1250mg  IV x 1 in the ED  Will follow with: Vancomycin 1250 mg IV Q 48 hrs. Goal AUC 400-550. Expected AUC: 498 SCr used: 0.8, Est Cmin 9.0  Height: 5\' 1"  (154.9 cm) Weight: 118 lb 3.2 oz (53.6 kg) IBW/kg (Calculated) : 47.8  Temp (24hrs), Avg:97.7 F (36.5 C), Min:97.5 F (36.4 C), Max:97.8 F (36.6 C)  Recent Labs  Lab 12/26/18 1556 12/26/18 2054  WBC 7.8  --   CREATININE 0.69  --   LATICACIDVEN 2.6* 2.1*    Estimated Creatinine Clearance: 33.9 mL/min (by C-G formula based on SCr of 0.69 mg/dL).    Allergies  Allergen Reactions  . Hydrocortisone Swelling  . Sulfa Antibiotics Other (See Comments)    Reaction: unknown  . Tape Dermatitis    Antimicrobials this admission: Vancomycin 9/10 >>  Dose adjustments this admission: None  Microbiology results: BCx 9/10 > pending  Thank you for allowing pharmacy to be a part of this patient's care.  Lu Duffel, PharmD, BCPS Clinical Pharmacist 12/27/2018 7:53 AM

## 2018-12-27 NOTE — Progress Notes (Signed)
Advanced care plan.  Purpose of the Encounter: CODE STATUS  Parties in Attendance: Patient herself  Patient's Decision Capacity: Intact  Subjective/Patient's story: Patient is 85 year old white female diabetes type 2, previous embolic CVA, thyroid cancer, hyperlipidemia, hypertension, osteoarthritis presenting to the hospital with generalized weakness with worsening wound infection.  Patient was started on IV antibiotics for cellulitis.   Objective/Medical story I discussed with the patient regarding her desires for cardiac and pulmonary resuscitation.   Goals of care determination:   Patient states that she has a DNR in place and has living will and healthcare power of attorney.  CODE STATUS: DNR this will be changed in the chart   Time spent discussing advanced care planning: 16 minutes

## 2018-12-27 NOTE — H&P (Signed)
Claire Washington is an 85 y.o. female.   Chief Complaint: Wound HPI: The patient with past medical history of embolic stroke, diabetes, hypertension and hyperlipidemia presents to the emergency department due to right lower extremity wound.  The patient ports injuring her left leg after a fall last week.  The wound was a simple abrasion but it has become more erythematous and tender.  She had been taking ciprofloxacin for urinary tract infection when her primary care doctor prescribed doxycycline 3 days ago due to worsening wound appearance.  She admits to mild malaise but denies nausea, vomiting, fever or diaphoresis.  In the emergency department the patient was given a dose of vancomycin and blood cultures were obtained in the event that she developed sepsis.  Due to her generalized weakness and need for observation of worsening infection the emergency department staff called the hospitalist service for admission.  Past Medical History:  Diagnosis Date  . Arthritis   . Basal cell carcinoma    basel cell  . Chronic cystitis   . Diabetes mellitus type 2, controlled, without complications ([place])    type 2 or unspecified type diabetes mellitus without mention of complication, not stated as uncontrolled  . Disease of thyroid gland   . Embolic stroke ([place])    a. 07/2016 MRI Brain: small right superior cerebellum and left lateral occipital cortex infarcts;  b. 08/2016 Echo: EF 60-65%, Gr1 DD, mild MR;  c. 08/2016 30 Day Event Monitor: No significant tachy/bradyarrhythmias. No afib.  . Follicular neoplasm of thyroid   . Hemorrhage of rectum and anus   . Hyperlipidemia   . Hyperlipidemia, unspecified   . Hypertension   . Leg pain   . Osteoarthritis   . Pernicious anemia   . Small vessel disease ([place])   . Spinal stenosis, lumbar region without neurogenic claudication   . Subdural hematoma ([place])    a. 2013 in setting of syncope.  . Syncope and collapse    a. 2013 - syncope and fall w/ resultant  subdural hematoma; b. prev nl echo and stress test @ [facility].  Marland Kitchen Thyroid nodule   . Vertigo & Chronic Dizziness    a. Previously seen by ENT with Physical Therapy.  MRI 2000 small vessel disease    Past Surgical History:  Procedure Laterality Date  . BILATERAL SALPINGOOPHORECTOMY    . COLONOSCOPY  01/26/2009   Diverticulosis  . CYSTOSCOPY    . HEMORRHOIDECTOMY BY SIMPLE LIGATION    . PR COLONOSCOPY FLX W/ENDOSCOPIC MUCOSAL RESECTION  09/02/2014   Procedure: COLONOSCOPY, FLEXIBLE; WITH ENDOSCOPIC MUCOSAL RESECTION; Surgeon: Saunders Revel, MD; [facility]; Service: Gastroenterology  . PR COLSC FLX W/RMVL OF TUMOR POLYP LESION SNARE TQ  09/02/2014   Procedure: COLONOSCOPY FLEX; W/REMOV TUMOR/LES BY SNARE; Surgeon: Saunders Revel, MD; Location: [facility]; Service: Gastroenterology  . SKIN SURGERY    . TOTAL ABDOMINAL HYSTERECTOMY W/ BILATERAL SALPINGOOPHORECTOMY  1970s    Family History  Problem Relation Age of Onset  . Colon cancer Mother   . Heart disease Father   . Heart attack Father   . Prostate cancer Father   . Stroke Father   . Heart attack Brother   . Asthma Daughter   . Diabetes Other        Siblings  . Breast cancer Neg Hx    Social History:  reports that she quit smoking about 25 years ago. Her smoking use included cigarettes. She has a 5.00 pack-year  smoking history. She has never used smokeless tobacco. She reports current alcohol use of about 2.0 standard drinks of alcohol per week. She reports that she does not use drugs.  Allergies:  Allergies  Allergen Reactions  . Hydrocortisone Swelling  . Sulfa Antibiotics Other (See Comments)    Reaction: unknown  . Tape Dermatitis    Medications Prior to Admission  Medication Sig Dispense Refill  . amLODipine (NORVASC) 5 MG tablet Take 1 tablet (5 mg total) by mouth 2 (two) times daily. 180 tablet 3  . atorvastatin (LIPITOR) 10 MG tablet TAKE 1 TABLET BY MOUTH  EVERY DAY 90 tablet 4  . ciprofloxacin (CIPRO) 250 MG tablet TAKE 1 TABLET (250 MG TOTAL) BY MOUTH 2 (TWO) TIMES DAILY FOR 7 DAYS    . clopidogrel (PLAVIX) 75 MG tablet TAKE 1 TABLET (75 MG TOTAL) BY MOUTH DAILY. 90 tablet 3  . doxycycline (VIBRAMYCIN) 100 MG capsule Take 1 capsule by mouth 2 (two) times daily. For 7 days    . metoprolol tartrate (LOPRESSOR) 25 MG tablet Take 0.5 tablets (12.5 mg total) by mouth 2 (two) times daily. 30 tablet 6  . Multiple Vitamins-Minerals (CENTRUM SILVER PO) Take 1 tablet by mouth daily. Reported on 06/11/2015    . mupirocin ointment (BACTROBAN) 2 % Apply 1 application topically 2 (two) times daily. For 7 days    . vitamin B-12 (CYANOCOBALAMIN) 1000 MCG tablet Take by mouth.    . Vitamin D, Ergocalciferol, (DRISDOL) 1.25 MG (50000 UT) CAPS capsule Take 50,000 Units by mouth every 7 (seven) days.    . diazepam (VALIUM) 2 MG tablet Take 1 tablet (2 mg total) by mouth 3 (three) times daily as needed (vertigo). (Patient not taking: Reported on 12/26/2018) 15 tablet 0  . hydrALAZINE (APRESOLINE) 25 MG tablet Take 1 tablet (25 mg total) by mouth 3 (three) times daily as needed. As needed for systolic blood pressure [id number]. 120 tablet 3    Results for orders placed or performed during the hospital encounter of 12/26/18 (from the past 48 hour(s))  Lactic acid, plasma     Status: Abnormal   Collection Time: 12/26/18  3:56 PM  Result Value Ref Range   Lactic Acid, Venous 2.6 (HH) 0.5 - 1.9 mmol/L    Comment: CRITICAL RESULT CALLED TO, READ BACK BY AND VERIFIED WITH STEPHANIE RUDD 12/26/18 1630 KLW Performed at [facility], [address] [place], [place] [address]   Comprehensive metabolic panel     Status: Abnormal   Collection Time: 12/26/18  3:56 PM  Result Value Ref Range   Sodium 139 135 - 145 mmol/L   Potassium 3.7 3.5 - 5.1 mmol/L   Chloride 105 98 - 111 mmol/L   CO2 24 22 - 32 mmol/L   Glucose, Bld 166 (H) 70 - 99 mg/dL   BUN 14 8 - 23 mg/dL    Creatinine, Ser 0.69 0.44 - 1.00 mg/dL   Calcium 9.5 8.9 - 10.3 mg/dL   Total Protein 7.2 6.5 - 8.1 g/dL   Albumin 4.5 3.5 - 5.0 g/dL   AST 19 15 - 41 U/L   ALT 17 0 - 44 U/L   Alkaline Phosphatase 65 38 - 126 U/L   Total Bilirubin 0.7 0.3 - 1.2 mg/dL   GFR calc non Af Amer >60 >60 mL/min   GFR calc Af Amer >60 >60 mL/min   Anion gap 10 5 - 15    Comment: Performed at [facility], [address]  [place], [place] [address]  CBC with Differential     Status: Abnormal   Collection Time: 12/26/18  3:56 PM  Result Value Ref Range   WBC 7.8 4.0 - 10.5 K/uL   RBC 5.33 (H) 3.87 - 5.11 MIL/uL   Hemoglobin 15.6 (H) 12.0 - 15.0 g/dL   HCT 45.9 36.0 - 46.0 %   MCV 86.1 80.0 - 100.0 fL   MCH 29.3 26.0 - 34.0 pg   MCHC 34.0 30.0 - 36.0 g/dL   RDW 13.2 11.5 - 15.5 %   Platelets 232 150 - 400 K/uL   nRBC 0.0 0.0 - 0.2 %   Neutrophils Relative % 64 %   Neutro Abs 5.0 1.7 - 7.7 K/uL   Lymphocytes Relative 26 %   Lymphs Abs 2.0 0.7 - 4.0 K/uL   Monocytes Relative 7 %   Monocytes Absolute 0.6 0.1 - 1.0 K/uL   Eosinophils Relative 2 %   Eosinophils Absolute 0.2 0.0 - 0.5 K/uL   Basophils Relative 1 %   Basophils Absolute 0.1 0.0 - 0.1 K/uL   Immature Granulocytes 0 %   Abs Immature Granulocytes 0.03 0.00 - 0.07 K/uL    Comment: Performed at [facility], [address] [place], [place] [address]  Lactic acid, plasma     Status: Abnormal   Collection Time: 12/26/18  8:54 PM  Result Value Ref Range   Lactic Acid, Venous 2.1 (HH) 0.5 - 1.9 mmol/L    Comment: CRITICAL RESULT CALLED TO, READ BACK BY AND VERIFIED WITH Liana Crocker RN AT 2125 ON 12/26/2018 SNG Performed at [facility], [address] [place], [place] [address]   Urinalysis, Complete w Microscopic     Status: Abnormal   Collection Time: 12/26/18  8:54 PM  Result Value Ref Range   Color, Urine STRAW (A) YELLOW   APPearance CLEAR (A) CLEAR   Specific Gravity, Urine 1.006 1.005 - 1.030   pH 5.0  5.0 - 8.0   Glucose, UA NEGATIVE NEGATIVE mg/dL   Hgb urine dipstick NEGATIVE NEGATIVE   Bilirubin Urine NEGATIVE NEGATIVE   Ketones, ur NEGATIVE NEGATIVE mg/dL   Protein, ur NEGATIVE NEGATIVE mg/dL   Nitrite NEGATIVE NEGATIVE   Leukocytes,Ua NEGATIVE NEGATIVE   WBC, UA 0-5 0 - 5 WBC/hpf   Bacteria, UA NONE SEEN NONE SEEN   Squamous Epithelial / LPF 0-5 0 - 5    Comment: Performed at [facility], [address] [place], [place] [address]  SARS Coronavirus 2 [facility] order, Performed in [facility] hospital lab) Nasopharyngeal Nasopharyngeal Swab     Status: None   Collection Time: 12/26/18  9:03 PM   Specimen: Nasopharyngeal Swab  Result Value Ref Range   SARS Coronavirus 2 NEGATIVE NEGATIVE    Comment: (NOTE) If result is NEGATIVE SARS-CoV-2 target nucleic acids are NOT DETECTED. The SARS-CoV-2 RNA is generally detectable in upper and lower  respiratory specimens during the acute phase of infection. The lowest  concentration of SARS-CoV-2 viral copies this assay can detect is 250  copies / mL. A negative result does not preclude SARS-CoV-2 infection  and should not be used as the sole basis for treatment or other  patient management decisions.  A negative result may occur with  improper specimen collection / handling, submission of specimen other  than nasopharyngeal swab, presence of viral mutation(s) within the  areas targeted by this assay, and inadequate number of viral copies  (<250 copies / mL). A negative result must be  combined with clinical  observations, patient history, and epidemiological information. If result is POSITIVE SARS-CoV-2 target nucleic acids are DETECTED. The SARS-CoV-2 RNA is generally detectable in upper and lower  respiratory specimens dur ing the acute phase of infection.  Positive  results are indicative of active infection with SARS-CoV-2.  Clinical  correlation with patient history and other diagnostic information is  necessary to  determine patient infection status.  Positive results do  not rule out bacterial infection or co-infection with other viruses. If result is PRESUMPTIVE POSTIVE SARS-CoV-2 nucleic acids MAY BE PRESENT.   A presumptive positive result was obtained on the submitted specimen  and confirmed on repeat testing.  While 2019 novel coronavirus  (SARS-CoV-2) nucleic acids may be present in the submitted sample  additional confirmatory testing may be necessary for epidemiological  and / or clinical management purposes  to differentiate between  SARS-CoV-2 and other Sarbecovirus currently known to infect humans.  If clinically indicated additional testing with an alternate test  methodology [phone]) is advised. The SARS-CoV-2 RNA is generally  detectable in upper and lower respiratory sp ecimens during the acute  phase of infection. The expected result is Negative. Fact Sheet for Patients:  StrictlyIdeas.no Fact Sheet for Healthcare Providers: BankingDealers.co.za This test is not yet approved or cleared by the [place] FDA and has been authorized for detection and/or diagnosis of SARS-CoV-2 by FDA under an Emergency Use Authorization (EUA).  This EUA will remain in effect (meaning this test can be used) for the duration of the COVID-19 declaration under Section 564(b)(1) of the Act, 21 U.S.C. section 360bbb-3(b)(1), unless the authorization is terminated or revoked sooner. Performed at [facility], [address] [place], [place] [address]    Dg Tibia/fibula Left  Result Date: 12/26/2018 CLINICAL DATA:  Abrasion EXAM: LEFT TIBIA AND FIBULA - 2 VIEW COMPARISON:  None. FINDINGS: There is soft tissue edema and skin thickening over the lateral aspect of the lower leg. No subjacent osseous injury. Vascular calcifications are noted in the soft tissues. Geometric radiodensities noted in the posterior soft tissues of the lower leg, could be  vascular calcium versus benign soft tissue mineralization versus less likely foreign body given depth within the tissues. Mild degenerative features are seen at the ankle and knee IMPRESSION: Soft tissue edema and skin thickening over the lateral aspect of the lower leg without subjacent osseous injury. Radiodensity within the posterior soft tissues, likely vascular calcium or soft tissue mineralization, less likely foreign body. Electronically Signed   By: Lovena Le M.D.   On: 12/26/2018 21:27    Review of Systems  Constitutional: Negative for chills and fever.  HENT: Negative for sore throat and tinnitus.   Eyes: Negative for blurred vision and redness.  Respiratory: Negative for cough and shortness of breath.   Cardiovascular: Negative for chest pain, palpitations, orthopnea and PND.  Gastrointestinal: Negative for abdominal pain, diarrhea, nausea and vomiting.  Genitourinary: Negative for dysuria, frequency and urgency.  Musculoskeletal: Negative for joint pain and myalgias.  Skin: Negative for rash.       No lesions  Neurological: Positive for weakness. Negative for speech change and focal weakness.  Endo/Heme/Allergies: Does not bruise/bleed easily.       No temperature intolerance  Psychiatric/Behavioral: Negative for depression and suicidal ideas.    Blood pressure (!) 145/74, pulse (!) 103, temperature 97.7 F (36.5 C), temperature source Oral, resp. rate 20, height 5\' 1"  (1.549 m), weight 53.6 kg, SpO2 98 %. Physical Exam  Vitals reviewed. Constitutional: She is oriented to person, place, and time. She appears well-developed and well-nourished. No distress.  HENT:  Head: Normocephalic and atraumatic.  Mouth/Throat: Oropharynx is clear and moist.  Eyes: Pupils are equal, round, and reactive to light. Conjunctivae and EOM are normal. No scleral icterus.  Neck: Normal range of motion. Neck supple. No JVD present. No tracheal deviation present. No thyromegaly present.   Cardiovascular: Normal rate, regular rhythm and normal heart sounds. Exam reveals no gallop and no friction rub.  No murmur heard. Respiratory: Effort normal and breath sounds normal.  GI: Soft. Bowel sounds are normal. She exhibits no distension. There is no abdominal tenderness.  Genitourinary:    Genitourinary Comments: Deferred   Musculoskeletal: Normal range of motion.        General: Tenderness present. No edema.  Lymphadenopathy:    She has no cervical adenopathy.  Neurological: She is alert and oriented to person, place, and time. No cranial nerve deficit. She exhibits normal muscle tone.  Skin: Skin is warm and dry. No rash noted. No erythema.  Psychiatric: She has a normal mood and affect. Her behavior is normal. Judgment and thought content normal.     Assessment/Plan This is a 85 year old female admitted for cellulitis. 1.  Cellulitis: Erythematous area of left leg that could be developing cellulitis.  Elevated lactic acid and generalized weakness could indicate harbinger of sepsis.  Continue vancomycin.  Transition to oral beta-lactam if no evidence of MRSA. 2.  Dehydration: Due to malaise and poor p.o. intake during recent course of antibiotics.  Explains intermittent tachycardia and elevated lactic acid.  Monitor for supporting data to diagnose sepsis. 3.  Hypertension: Uncontrolled; continue amlodipine and metoprolol.  Hydralazine as needed 4.  Hyperlipidemia: Continue statin therapy 5.  Cerebrovascular disease:  History of stroke; continue Plavix 6.  DVT prophylaxis: Lovenox 7.  GI prophylaxis: None The patient is a full code.  Time spent on admission orders and patient care approximately 45 minutes  Harrie Foreman, MD 12/27/2018, 4:24 AM

## 2018-12-27 NOTE — ED Notes (Signed)
ED TO INPATIENT HANDOFF REPORT  ED Nurse Name and Phone #: Seth Bake [phone]  S Name/Age/Gender Claire Washington 85 y.o. female Room/Bed: ED26A/ED26A  Code Status   Code Status: Prior  Home/SNF/Other Home Patient oriented to: self, place, time and situation Is this baseline? Yes   Triage Complete: Triage complete  Chief Complaint Lt leg pain  Triage Note PT c/o fall on 9/1 and was c/o abrasion on LFT leg getting worse. Pt was treated from walk in clinic with no improvement. Pt has noted eschar and redness around wound. Wound is closed. Denies any drainage or fever. Pt ambulatory with NAD    Allergies Allergies  Allergen Reactions  . Hydrocortisone Swelling  . Sulfa Antibiotics Other (See Comments)    Reaction: unknown  . Tape Dermatitis    Level of Care/Admitting Diagnosis ED Disposition    ED Disposition Condition [facility] [place] [facility]: [place] [[id number]]  Level of Care: Med-Surg [16]  Covid Evaluation: Asymptomatic Screening Protocol (No Symptoms)  Diagnosis: Cellulitis [id number]  Admitting Physician: Harrie Foreman [id number]  Attending Physician: Harrie Foreman [id number]  PT Class (Do Not Modify): Observation [104]  PT Acc Code (Do Not Modify): Observation [10022]       B Medical/Surgery History Past Medical History:  Diagnosis Date  . Arthritis   . Basal cell carcinoma    basel cell  . Chronic cystitis   . Diabetes mellitus type 2, controlled, without complications ([place])    type 2 or unspecified type diabetes mellitus without mention of complication, not stated as uncontrolled  . Disease of thyroid gland   . Embolic stroke ([place])    a. 07/2016 MRI Brain: small right superior cerebellum and left lateral occipital cortex infarcts;  b. 08/2016 Echo: EF 60-65%, Gr1 DD, mild MR;  c. 08/2016 30 Day Event Monitor: No significant tachy/bradyarrhythmias. No afib.  . Follicular neoplasm of thyroid   . Hemorrhage of rectum and  anus   . Hyperlipidemia   . Hyperlipidemia, unspecified   . Hypertension   . Leg pain   . Osteoarthritis   . Pernicious anemia   . Small vessel disease ([place])   . Spinal stenosis, lumbar region without neurogenic claudication   . Subdural hematoma ([place])    a. 2013 in setting of syncope.  . Syncope and collapse    a. 2013 - syncope and fall w/ resultant subdural hematoma; b. prev nl echo and stress test @ [facility].  Marland Kitchen Thyroid nodule   . Vertigo & Chronic Dizziness    a. Previously seen by ENT with Physical Therapy.  MRI 2000 small vessel disease   Past Surgical History:  Procedure Laterality Date  . BILATERAL SALPINGOOPHORECTOMY    . COLONOSCOPY  01/26/2009   Diverticulosis  . CYSTOSCOPY    . HEMORRHOIDECTOMY BY SIMPLE LIGATION    . PR COLONOSCOPY FLX W/ENDOSCOPIC MUCOSAL RESECTION  09/02/2014   Procedure: COLONOSCOPY, FLEXIBLE; WITH ENDOSCOPIC MUCOSAL RESECTION; Surgeon: Saunders Revel, MD; [facility]; Service: Gastroenterology  . PR COLSC FLX W/RMVL OF TUMOR POLYP LESION SNARE TQ  09/02/2014   Procedure: COLONOSCOPY FLEX; W/REMOV TUMOR/LES BY SNARE; Surgeon: Saunders Revel, MD; [facility]: [facility]; Service: Gastroenterology  . SKIN SURGERY    . TOTAL ABDOMINAL HYSTERECTOMY W/ BILATERAL SALPINGOOPHORECTOMY  1970s     A IV Location/Drains/Wounds Patient Lines/Drains/Airways Status   Active Line/Drains/Airways    Name:   Placement date:   Placement time:  Site:   Days:   Peripheral IV 12/26/18 Right Antecubital   12/26/18    2053    Antecubital   1          Intake/Output Last 24 hours No intake or output data in the 24 hours ending 12/27/18 0208  Labs/Imaging Results for orders placed or performed during the hospital encounter of 12/26/18 (from the past 48 hour(s))  Lactic acid, plasma     Status: Abnormal   Collection Time: 12/26/18  3:56 PM  Result Value Ref Range   Lactic Acid, Venous 2.6 (HH) 0.5 - 1.9 mmol/L     Comment: CRITICAL RESULT CALLED TO, READ BACK BY AND VERIFIED WITH STEPHANIE RUDD 12/26/18 1630 KLW Performed at [facility], [address] [place], [place] [address]   Comprehensive metabolic panel     Status: Abnormal   Collection Time: 12/26/18  3:56 PM  Result Value Ref Range   Sodium 139 135 - 145 mmol/L   Potassium 3.7 3.5 - 5.1 mmol/L   Chloride 105 98 - 111 mmol/L   CO2 24 22 - 32 mmol/L   Glucose, Bld 166 (H) 70 - 99 mg/dL   BUN 14 8 - 23 mg/dL   Creatinine, Ser 0.69 0.44 - 1.00 mg/dL   Calcium 9.5 8.9 - 10.3 mg/dL   Total Protein 7.2 6.5 - 8.1 g/dL   Albumin 4.5 3.5 - 5.0 g/dL   AST 19 15 - 41 U/L   ALT 17 0 - 44 U/L   Alkaline Phosphatase 65 38 - 126 U/L   Total Bilirubin 0.7 0.3 - 1.2 mg/dL   GFR calc non Af Amer >60 >60 mL/min   GFR calc Af Amer >60 >60 mL/min   Anion gap 10 5 - 15    Comment: Performed at [facility], [address] [place], [place] [address]  CBC with Differential     Status: Abnormal   Collection Time: 12/26/18  3:56 PM  Result Value Ref Range   WBC 7.8 4.0 - 10.5 K/uL   RBC 5.33 (H) 3.87 - 5.11 MIL/uL   Hemoglobin 15.6 (H) 12.0 - 15.0 g/dL   HCT 45.9 36.0 - 46.0 %   MCV 86.1 80.0 - 100.0 fL   MCH 29.3 26.0 - 34.0 pg   MCHC 34.0 30.0 - 36.0 g/dL   RDW 13.2 11.5 - 15.5 %   Platelets 232 150 - 400 K/uL   nRBC 0.0 0.0 - 0.2 %   Neutrophils Relative % 64 %   Neutro Abs 5.0 1.7 - 7.7 K/uL   Lymphocytes Relative 26 %   Lymphs Abs 2.0 0.7 - 4.0 K/uL   Monocytes Relative 7 %   Monocytes Absolute 0.6 0.1 - 1.0 K/uL   Eosinophils Relative 2 %   Eosinophils Absolute 0.2 0.0 - 0.5 K/uL   Basophils Relative 1 %   Basophils Absolute 0.1 0.0 - 0.1 K/uL   Immature Granulocytes 0 %   Abs Immature Granulocytes 0.03 0.00 - 0.07 K/uL    Comment: Performed at [facility], [address] [place], [place] [address]  Lactic acid, plasma     Status: Abnormal   Collection Time: 12/26/18  8:54 PM  Result Value Ref  Range   Lactic Acid, Venous 2.1 (HH) 0.5 - 1.9 mmol/L    Comment: CRITICAL RESULT CALLED TO, READ BACK BY AND VERIFIED WITH Liana Crocker RN AT 2125 ON 12/26/2018 SNG Performed at [facility], [address] [place],  [place] [address]   Urinalysis, Complete w Microscopic     Status: Abnormal   Collection Time: 12/26/18  8:54 PM  Result Value Ref Range   Color, Urine STRAW (A) YELLOW   APPearance CLEAR (A) CLEAR   Specific Gravity, Urine 1.006 1.005 - 1.030   pH 5.0 5.0 - 8.0   Glucose, UA NEGATIVE NEGATIVE mg/dL   Hgb urine dipstick NEGATIVE NEGATIVE   Bilirubin Urine NEGATIVE NEGATIVE   Ketones, ur NEGATIVE NEGATIVE mg/dL   Protein, ur NEGATIVE NEGATIVE mg/dL   Nitrite NEGATIVE NEGATIVE   Leukocytes,Ua NEGATIVE NEGATIVE   WBC, UA 0-5 0 - 5 WBC/hpf   Bacteria, UA NONE SEEN NONE SEEN   Squamous Epithelial / LPF 0-5 0 - 5    Comment: Performed at [facility], [address] [place], [place] [address]  SARS Coronavirus 2 [facility] order, Performed in [facility] hospital lab) Nasopharyngeal Nasopharyngeal Swab     Status: None   Collection Time: 12/26/18  9:03 PM   Specimen: Nasopharyngeal Swab  Result Value Ref Range   SARS Coronavirus 2 NEGATIVE NEGATIVE    Comment: (NOTE) If result is NEGATIVE SARS-CoV-2 target nucleic acids are NOT DETECTED. The SARS-CoV-2 RNA is generally detectable in upper and lower  respiratory specimens during the acute phase of infection. The lowest  concentration of SARS-CoV-2 viral copies this assay can detect is 250  copies / mL. A negative result does not preclude SARS-CoV-2 infection  and should not be used as the sole basis for treatment or other  patient management decisions.  A negative result may occur with  improper specimen collection / handling, submission of specimen other  than nasopharyngeal swab, presence of viral mutation(s) within the  areas targeted by this assay, and inadequate number of viral copies  (<250  copies / mL). A negative result must be combined with clinical  observations, patient history, and epidemiological information. If result is POSITIVE SARS-CoV-2 target nucleic acids are DETECTED. The SARS-CoV-2 RNA is generally detectable in upper and lower  respiratory specimens dur ing the acute phase of infection.  Positive  results are indicative of active infection with SARS-CoV-2.  Clinical  correlation with patient history and other diagnostic information is  necessary to determine patient infection status.  Positive results do  not rule out bacterial infection or co-infection with other viruses. If result is PRESUMPTIVE POSTIVE SARS-CoV-2 nucleic acids MAY BE PRESENT.   A presumptive positive result was obtained on the submitted specimen  and confirmed on repeat testing.  While 2019 novel coronavirus  (SARS-CoV-2) nucleic acids may be present in the submitted sample  additional confirmatory testing may be necessary for epidemiological  and / or clinical management purposes  to differentiate between  SARS-CoV-2 and other Sarbecovirus currently known to infect humans.  If clinically indicated additional testing with an alternate test  methodology [phone]) is advised. The SARS-CoV-2 RNA is generally  detectable in upper and lower respiratory sp ecimens during the acute  phase of infection. The expected result is Negative. Fact Sheet for Patients:  StrictlyIdeas.no Fact Sheet for Healthcare Providers: BankingDealers.co.za This test is not yet approved or cleared by the [place] FDA and has been authorized for detection and/or diagnosis of SARS-CoV-2 by FDA under an Emergency Use Authorization (EUA).  This EUA will remain in effect (meaning this test can be used) for the duration of the COVID-19 declaration under Section 564(b)(1) of the Act, 21 U.S.C. section 360bbb-3(b)(1), unless the authorization is terminated or revoked  sooner. Performed at [facility], [address] [place], [place] [address]    Dg Tibia/fibula Left  Result Date: 12/26/2018 CLINICAL DATA:  Abrasion EXAM: LEFT TIBIA AND FIBULA - 2 VIEW COMPARISON:  None. FINDINGS: There is soft tissue edema and skin thickening over the lateral aspect of the lower leg. No subjacent osseous injury. Vascular calcifications are noted in the soft tissues. Geometric radiodensities noted in the posterior soft tissues of the lower leg, could be vascular calcium versus benign soft tissue mineralization versus less likely foreign body given depth within the tissues. Mild degenerative features are seen at the ankle and knee IMPRESSION: Soft tissue edema and skin thickening over the lateral aspect of the lower leg without subjacent osseous injury. Radiodensity within the posterior soft tissues, likely vascular calcium or soft tissue mineralization, less likely foreign body. Electronically Signed   By: Lovena Le M.D.   On: 12/26/2018 21:27    Pending Labs Unresulted Labs (From admission, onward)    Start     Ordered   12/26/18 2055  Blood culture (routine x 2)  BLOOD CULTURE X 2,   STAT     12/26/18 2054   Signed and Held  Creatinine, serum  (enoxaparin (LOVENOX)    CrCl >/= 30 ml/min)  Weekly,   R    Comments: while on enoxaparin therapy    Signed and Held   Signed and Held  TSH  Add-on,   R     Signed and Held          Vitals/Pain Today's Vitals   12/26/18 2228 12/26/18 2337 12/27/18 0021 12/27/18 0200  BP:  (!) 176/62 (!) 152/69 (!) 159/98  Pulse:  92 94 (!) 115  Resp:  18 20 20   Temp:      TempSrc:      SpO2:  96% 99% 95%  Weight: 54.4 kg     PainSc:  3       Isolation Precautions No active isolations  Medications Medications  sodium chloride flush (NS) 0.9 % injection 3 mL (has no administration in time range)  sodium chloride 0.9 % bolus 1,000 mL (0 mLs Intravenous Stopped 12/26/18 2228)  vancomycin (VANCOCIN) 1,250 mg in sodium  chloride 0.9 % 250 mL IVPB (0 mg Intravenous Stopped 12/27/18 0114)  acetaminophen (TYLENOL) tablet 1,000 mg (1,000 mg Oral Given 12/27/18 0110)    Mobility walks with device Low fall risk   Focused Assessments Cardiac Assessment Handoff:    Lab Results  Component Value Date   CKTOTAL 35 08/25/2011   CKMB 0.8 08/25/2011   TROPONINI <0.03 11/30/2017   No results found for: DDIMER Does the Patient currently have chest pain? No     R Recommendations: See Admitting Provider Note  Report given to:   Additional Notes: n/a

## 2018-12-27 NOTE — Consult Note (Signed)
Pharmacy Antibiotic Note  Claire Washington is a 85 y.o. female admitted on 12/26/2018 with wound infection.  Pharmacy has been consulted for Unasyn dosing.  Plan: D/C Vancomycin  Start Unasyn 3g q6h  Height: 5\' 1"  (154.9 cm) Weight: 118 lb 3.2 oz (53.6 kg) IBW/kg (Calculated) : 47.8  Temp (24hrs), Avg:97.7 F (36.5 C), Min:97.5 F (36.4 C), Max:97.8 F (36.6 C)  Recent Labs  Lab 12/26/18 1556 12/26/18 2054  WBC 7.8  --   CREATININE 0.69  --   LATICACIDVEN 2.6* 2.1*    Estimated Creatinine Clearance: 33.9 mL/min (by C-G formula based on SCr of 0.69 mg/dL).    Allergies  Allergen Reactions  . Hydrocortisone Swelling  . Sulfa Antibiotics Other (See Comments)    Reaction: unknown  . Tape Dermatitis    Antimicrobials this admission: Vancomycin 9/10 >>9/10 Unasyn 9/11 >>  Dose adjustments this admission: None  Microbiology results: BCx 9/10 > pending  Thank you for allowing pharmacy to be a part of this patient's care.  Lu Duffel, PharmD, BCPS Clinical Pharmacist 12/27/2018 12:09 PM

## 2018-12-27 NOTE — Progress Notes (Signed)
0542: Pt endorses bilateral upper leg pain that began in the ED and that she describes as muscle spasm or tension. Decreased but not unrelieved by massage and repositioning. On call notified. Verbal order for flexeril placed

## 2018-12-28 LAB — BASIC METABOLIC PANEL
Anion gap: 6 (ref 5–15)
BUN: 10 mg/dL (ref 8–23)
CO2: 26 mmol/L (ref 22–32)
Calcium: 8.2 mg/dL — ABNORMAL LOW (ref 8.9–10.3)
Chloride: 111 mmol/L (ref 98–111)
Creatinine, Ser: 0.54 mg/dL (ref 0.44–1.00)
GFR calc Af Amer: >60 mL/min (ref >60)
GFR calc non Af Amer: >60 mL/min (ref >60)
Glucose, Bld: 105 mg/dL — ABNORMAL HIGH (ref 70–99)
Potassium: 3.3 mmol/L — ABNORMAL LOW (ref 3.5–5.1)
Sodium: 143 mmol/L (ref 135–145)

## 2018-12-28 LAB — CBC
HCT: 37.5 % (ref 36.0–46.0)
Hemoglobin: 12.6 g/dL (ref 12.0–15.0)
MCH: 29.3 pg (ref 26.0–34.0)
MCHC: 33.6 g/dL (ref 30.0–36.0)
MCV: 87.2 fL (ref 80.0–100.0)
Platelets: 178 10*3/uL (ref 150–400)
RBC: 4.3 MIL/uL (ref 3.87–5.11)
RDW: 13.5 % (ref 11.5–15.5)
WBC: 5.6 10*3/uL (ref 4.0–10.5)
nRBC: 0 % (ref 0.0–0.2)

## 2018-12-28 LAB — LACTIC ACID, PLASMA: Lactic Acid, Venous: 0.7 mmol/L (ref 0.5–1.9)

## 2018-12-28 MED ORDER — POTASSIUM CHLORIDE CRYS ER 20 MEQ PO TBCR
40.0000 meq | EXTENDED_RELEASE_TABLET | Freq: Once | ORAL | Status: AC
  Administered 2018-12-28: 40 meq via ORAL
  Filled 2018-12-28: qty 2

## 2018-12-28 NOTE — Progress Notes (Signed)
[place] [facility]                                                                                                                                                                                  Patient Demographics   Claire Washington, is a 85 y.o. female, DOB - 03/05/1925, [id number]  Admit date - 12/26/2018   Admitting Physician Harrie Foreman, MD  Outpatient Primary MD for the patient is Tracie Harrier, MD   LOS - 1  Subjective: Patient states she was having bilateral leg pain yesterday, but it is a little bit better today with the pain medicines we have been giving her.  She feels like the redness and warmth of her left lower extremity is improved from yesterday.  No other concerns this morning.   Review of Systems:   CONSTITUTIONAL: No documented fever. No fatigue, weakness. No weight gain, no weight loss.  EYES: No blurry or double vision.  ENT: No tinnitus. No postnasal drip. No redness of the oropharynx.  RESPIRATORY: No cough, no wheeze, no hemoptysis. No dyspnea.  CARDIOVASCULAR: No chest pain. No orthopnea. No palpitations. No syncope.  GASTROINTESTINAL: No nausea, no vomiting or diarrhea. No abdominal pain. No melena or hematochezia.  GENITOURINARY: No dysuria or hematuria.  ENDOCRINE: No polyuria or nocturia. No heat or cold intolerance.  HEMATOLOGY: No anemia. No bruising. No bleeding.  INTEGUMENTARY: No rashes. No lesions.  MUSCULOSKELETAL: +joint pain. No swelling. No gout. +redness of left lower leg NEUROLOGIC: No numbness, tingling, or ataxia. No seizure-type activity.  PSYCHIATRIC: No anxiety. No insomnia. No ADD.    Vitals:   Vitals:   12/27/18 1928 12/28/18 0358 12/28/18 0402 12/28/18 1008  BP: (!) 143/54 (!) 115/57  (!) 127/52  Pulse: 81 67  79  Resp: 20 20  18   Temp: 97.6 F (36.4 C) 98 F (36.7 C)  98 F (36.7 C)  TempSrc: Oral Oral  Oral  SpO2: 93% 92%  92%  Weight:   54.8 kg   Height:        Wt Readings  from Last 3 Encounters:  12/28/18 54.8 kg  07/17/18 54.6 kg  06/13/18 55.3 kg     Intake/Output Summary (Last 24 hours) at 12/28/2018 1121 Last data filed at 12/28/2018 1022 Gross per 24 hour  Intake 1629.33 ml  Output 1700 ml  Net -70.67 ml    Physical Exam:   GENERAL: Pleasant-appearing in no apparent distress.  HEENT: Atraumatic, normocephalic. Extraocular muscles are intact. Pupils equal and reactive to light. Sclerae anicteric. No conjunctival injection. No oro-pharyngeal erythema.  NECK: Supple. There is no jugular venous distention. No bruits, no lymphadenopathy, no thyromegaly.  HEART: Regular rate and rhythm,. No murmurs, no rubs, no clicks.  LUNGS: Clear to auscultation bilaterally. No rales or rhonchi. No wheezes.  ABDOMEN: Soft, flat, nontender, nondistended. Has good bowel sounds. No hepatosplenomegaly appreciated.  EXTREMITIES: No evidence of any cyanosis, clubbing, or peripheral edema.  +2 pedal and radial pulses bilaterally.  NEUROLOGIC: The patient is alert, awake, and oriented x3 with no focal motor or sensory deficits appreciated bilaterally.  SKIN: Moist and warm with no rashes appreciated. +left lower extremity with an abrasion and surrounding erythema and warmth. Psych: Not anxious, depressed      Antibiotics   Anti-infectives (From admission, onward)   Start     Dose/Rate Route Frequency Ordered Stop   12/28/18 2200  vancomycin (VANCOCIN) 1,250 mg in sodium chloride 0.9 % 250 mL IVPB  Status:  Discontinued     1,250 mg 166.7 mL/hr over 90 Minutes Intravenous Every 48 hours 12/27/18 0749 12/27/18 1206   12/27/18 1300  Ampicillin-Sulbactam (UNASYN) 3 g in sodium chloride 0.9 % 100 mL IVPB     3 g 200 mL/hr over 30 Minutes Intravenous Every 6 hours 12/27/18 1208     12/26/18 2245  vancomycin (VANCOCIN) 1,250 mg in sodium chloride 0.9 % 250 mL IVPB     1,250 mg 166.7 mL/hr over 90 Minutes Intravenous  Once 12/26/18 2235 12/27/18 0114      Medications    Scheduled Meds: . amLODipine  5 mg Oral BID  . atorvastatin  10 mg Oral Daily  . clopidogrel  75 mg Oral Daily  . docusate sodium  100 mg Oral BID  . enoxaparin (LOVENOX) injection  40 mg Subcutaneous Q24H  . metoprolol tartrate  12.5 mg Oral BID  . sodium chloride flush  3 mL Intravenous Once  . vitamin B-12  1,000 mcg Oral Daily  . [START ON 12/29/2018] Vitamin D (Ergocalciferol)  50,000 Units Oral Q7 days   Continuous Infusions: . sodium chloride 75 mL/hr at 12/28/18 0503  . ampicillin-sulbactam (UNASYN) IV 3 g (12/28/18 0626)   PRN Meds:.acetaminophen **OR** acetaminophen, cyclobenzaprine, diazepam, hydrALAZINE, ondansetron **OR** ondansetron (ZOFRAN) IV, traMADol   Data Review:   Micro Results Recent Results (from the past 240 hour(s))  Blood culture (routine x 2)     Status: None (Preliminary result)   Collection Time: 12/26/18  8:55 PM   Specimen: BLOOD  Result Value Ref Range Status   Specimen Description BLOOD RIGHT ANTECUBITAL  Final   Special Requests   Final    BOTTLES DRAWN AEROBIC AND ANAEROBIC Blood Culture adequate volume   Culture   Final    NO GROWTH 2 DAYS Performed at [facility], [address] [place],  [address]    Report Status PENDING  Incomplete  SARS Coronavirus 2 [facility] order, Performed in [facility] hospital lab) Nasopharyngeal Nasopharyngeal Swab     Status: None   Collection Time: 12/26/18  9:03 PM   Specimen: Nasopharyngeal Swab  Result Value Ref Range Status   SARS Coronavirus 2 NEGATIVE NEGATIVE Final    Comment: (NOTE) If result is NEGATIVE SARS-CoV-2 target nucleic acids are NOT DETECTED. The SARS-CoV-2 RNA is generally detectable in upper and lower  respiratory specimens during the acute phase of infection. The lowest  concentration of SARS-CoV-2 viral copies this assay can detect is 250  copies / mL. A negative result does not preclude SARS-CoV-2 infection  and should not be used as the sole basis for treatment  or other  patient management decisions.  A negative result may occur with  improper specimen collection / handling, submission of specimen other  than nasopharyngeal swab, presence of viral mutation(s) within the  areas targeted by this assay, and inadequate number of viral copies  (<250 copies / mL). A negative result must be combined with clinical  observations, patient history, and epidemiological information. If result is POSITIVE SARS-CoV-2 target nucleic acids are DETECTED. The SARS-CoV-2 RNA is generally detectable in upper and lower  respiratory specimens dur ing the acute phase of infection.  Positive  results are indicative of active infection with SARS-CoV-2.  Clinical  correlation with patient history and other diagnostic information is  necessary to determine patient infection status.  Positive results do  not rule out bacterial infection or co-infection with other viruses. If result is PRESUMPTIVE POSTIVE SARS-CoV-2 nucleic acids MAY BE PRESENT.   A presumptive positive result was obtained on the submitted specimen  and confirmed on repeat testing.  While 2019 novel coronavirus  (SARS-CoV-2) nucleic acids may be present in the submitted sample  additional confirmatory testing may be necessary for epidemiological  and / or clinical management purposes  to differentiate between  SARS-CoV-2 and other Sarbecovirus currently known to infect humans.  If clinically indicated additional testing with an alternate test  methodology [phone]) is advised. The SARS-CoV-2 RNA is generally  detectable in upper and lower respiratory sp ecimens during the acute  phase of infection. The expected result is Negative. Fact Sheet for Patients:  StrictlyIdeas.no Fact Sheet for Healthcare Providers: BankingDealers.co.za This test is not yet approved or cleared by the [place] FDA and has been authorized for detection and/or diagnosis of  SARS-CoV-2 by FDA under an Emergency Use Authorization (EUA).  This EUA will remain in effect (meaning this test can be used) for the duration of the COVID-19 declaration under Section 564(b)(1) of the Act, 21 U.S.C. section 360bbb-3(b)(1), unless the authorization is terminated or revoked sooner. Performed at [facility], [address] [place], [place] [address]   Blood culture (routine x 2)     Status: None (Preliminary result)   Collection Time: 12/26/18 11:33 PM   Specimen: BLOOD  Result Value Ref Range Status   Specimen Description BLOOD RIGHT ANTECUBITAL  Final   Special Requests   Final    BOTTLES DRAWN AEROBIC AND ANAEROBIC Blood Culture results may not be optimal due to an excessive volume of blood received in culture bottles   Culture   Final    NO GROWTH 2 DAYS Performed at [facility], [address], [place], [place] [address]    Report Status PENDING  Incomplete    Radiology Reports Dg Tibia/fibula Left  Result Date: 12/26/2018 CLINICAL DATA:  Abrasion EXAM: LEFT TIBIA AND FIBULA - 2 VIEW COMPARISON:  None. FINDINGS: There is soft tissue edema and skin thickening over the lateral aspect of the lower leg. No subjacent osseous injury. Vascular calcifications are noted in the soft tissues. Geometric radiodensities noted in the posterior soft tissues of the lower leg, could be vascular calcium versus benign soft tissue mineralization versus less likely foreign body given depth within the tissues. Mild degenerative features are seen at the ankle and knee IMPRESSION: Soft tissue edema and skin thickening over the lateral aspect of the lower leg without subjacent osseous injury. Radiodensity within the posterior soft tissues, likely vascular calcium or soft tissue mineralization, less likely foreign body. Electronically Signed   By: Lovena Le M.D.   On: 12/26/2018 21:27   Ct  Pelvis Wo Contrast  Result Date: 12/27/2018 CLINICAL DATA:  Recent fall, pelvic  pain EXAM: CT PELVIS WITHOUT CONTRAST TECHNIQUE: Multidetector CT imaging of the pelvis was performed following the standard protocol without intravenous contrast. Sagittal and coronal MPR images reconstructed from axial data set. COMPARISON:  CT abdomen and pelvis 12/29/2014 FINDINGS: Urinary Tract: Well distended bladder without obvious mass. Distal ureters decompressed. Bowel: Sigmoid diverticulosis. Visualized large and small bowel loops otherwise unremarkable Vascular/Lymphatic: Atherosclerotic calcifications of aortic bifurcation and iliac arteries. No adenopathy. Reproductive: Uterus surgically absent with nonvisualization of ovaries Other:  No free air or free fluid.  No hernia. Musculoskeletal: Diffuse osseous demineralization. Facet degenerative changes at visualized lumbar spine. SI joints preserved. No fracture, dislocation, or bone destruction. Grade 1 anterolisthesis L4-L5 due to a combination of facet and disc degenerative changes. IMPRESSION: Osseous demineralization with degenerative disc/facet disease changes at lower lumbar spine. No acute osseous abnormalities. Sigmoid diverticulosis. Electronically Signed   By: Lavonia Dana M.D.   On: 12/27/2018 11:50     CBC Recent Labs  Lab 12/26/18 1556 12/28/18 0526  WBC 7.8 5.6  HGB 15.6* 12.6  HCT 45.9 37.5  PLT 232 178  MCV 86.1 87.2  MCH 29.3 29.3  MCHC 34.0 33.6  RDW 13.2 13.5  LYMPHSABS 2.0  --   MONOABS 0.6  --   EOSABS 0.2  --   BASOSABS 0.1  --     Chemistries  Recent Labs  Lab 12/26/18 1556 12/28/18 0526  NA 139 143  K 3.7 3.3*  CL 105 111  CO2 24 26  GLUCOSE 166* 105*  BUN 14 10  CREATININE 0.69 0.54  CALCIUM 9.5 8.2*  AST 19  --   ALT 17  --   ALKPHOS 65  --   BILITOT 0.7  --    ------------------------------------------------------------------------------------------------------------------ estimated creatinine clearance is 33.9 mL/min (by C-G formula based on SCr of 0.54 mg/dL).  ------------------------------------------------------------------------------------------------------------------ No results for input(s): HGBA1C in the last 72 hours. ------------------------------------------------------------------------------------------------------------------ No results for input(s): CHOL, HDL, LDLCALC, TRIG, CHOLHDL, LDLDIRECT in the last 72 hours. ------------------------------------------------------------------------------------------------------------------ Recent Labs    12/26/18 1556  TSH 4.292   ------------------------------------------------------------------------------------------------------------------ No results for input(s): VITAMINB12, FOLATE, FERRITIN, TIBC, IRON, RETICCTPCT in the last 72 hours.  Coagulation profile No results for input(s): INR, PROTIME in the last 168 hours.  No results for input(s): DDIMER in the last 72 hours.  Cardiac Enzymes No results for input(s): CKMB, TROPONINI, MYOGLOBIN in the last 168 hours.  Invalid input(s): CK ------------------------------------------------------------------------------------------------------------------ Invalid input(s): POCBNP    Assessment & Plan   Left leg cellulitis- improving. Patient was not meeting sepsis criteria on admission.  She did have a lactic acidosis on admission, but this has resolved. -Continue IV unasyn -Blood cultures with no growth x 2 days -Continue IVFs for today  Bilateral hip pain- improving -CT pelvis 9/11 with degenerative disc/facet disease in the lumbar spine -PT consult -Continue Tylenol and tramadol as needed  Hypokalemia -Replete and recheck  Hypertension- BPs have improved -Continue home Norvasc and metoprolol -IV hydralazine PRN  Hyperlipidemia- stable -Continue home statin  History of cerebrovascular disease -Continue plavix      Code Status Orders  (From admission, onward)         Start     Ordered   12/27/18 1029  Do not  attempt resuscitation (DNR)  Continuous    Question Answer Comment  In the event of cardiac or respiratory ARREST Do not call a "code blue"   In the  event of cardiac or respiratory ARREST Do not perform Intubation, CPR, defibrillation or ACLS   In the event of cardiac or respiratory ARREST Use medication by any route, position, wound care, and other measures to relive pain and suffering. May use oxygen, suction and manual treatment of airway obstruction as needed for comfort.      12/27/18 1028        Code Status History    Date Active Date Inactive Code Status Order ID Comments User Context   12/27/2018 0240 12/27/2018 1028 Full Code [id number]  Harrie Foreman, MD Inpatient   11/30/2017 1731 12/01/2017 1719 Full Code [id number]  Demetrios Loll, MD Inpatient   10/19/2016 2251 10/23/2016 1717 Full Code [id number]  Hugelmeyer, Ubaldo Glassing, DO Inpatient   Advance Care Planning Activity    Advance Directive Documentation     Most Recent Value  Type of Advance Directive  Healthcare Power of Attorney  Pre-existing out of facility DNR order (yellow form or pink MOST form)  -  "MOST" Form in Place?  -           Consults none  DVT Prophylaxis  Lovenox  Lab Results  Component Value Date   PLT 178 12/28/2018     Time Spent in minutes   45 minutes  Greater than 50% of time spent in care coordination and counseling patient regarding the condition and plan of care.   Berna Spare Mayo M.D on 12/28/2018 at 11:21 AM  Between 7am to 6pm - Pager - [phone]  After 6pm go to [email or web address] - Proofreader  [employer]   Office  [phone]

## 2018-12-28 NOTE — Progress Notes (Signed)
Physical Therapy Evaluation Patient Details Name: Claire Washington MRN: [id number] DOB: May 26, 1924 Today's Date: 12/28/2018   History of Present Illness  HPI: The patient with past medical history of embolic stroke, diabetes, hypertension and hyperlipidemia presents to the emergency department due to right lower extremity wound.  The patient ports injuring her left leg after a fall last week.   Clinical Impression  Patient needs min assist for bed mobility supine<>sit, min assist for sit to stand transfer with RW, min assist for gait with RW, IV pole 80 feet with jerking motions moving throughout her body and into UE's and jerking the RW. She reports moderate pain and has fatigue with walking. She will continue to benefit from skilled PT to improve mobility, strength, and safety.      Follow Up Recommendations SNF    Equipment Recommendations  None recommended by PT    Recommendations for Other Services       Precautions / Restrictions Precautions Precautions: Fall Restrictions Weight Bearing Restrictions: No      Mobility  Bed Mobility   Bed Mobility: Sit to Supine;Supine to Sit     Supine to sit: Mod assist Sit to supine: Mod assist      Transfers Overall transfer level: Needs assistance Equipment used: 4-wheeled walker             General transfer comment: needs VC for safety  Ambulation/Gait Ambulation/Gait assistance: Min guard Gait Distance (Feet): 80 Feet Assistive device: 4-wheeled walker Gait Pattern/deviations: Step-to pattern        Stairs            Wheelchair Mobility    Modified Rankin (Stroke Patients Only)       Balance Overall balance assessment: Needs assistance Sitting-balance support: Bilateral upper extremity supported Sitting balance-Leahy Scale: Good                                       Pertinent Vitals/Pain Pain Assessment: 0-10 Pain Score: 5  Pain Location: (legs) Pain Descriptors /  Indicators: Grimacing;Aching    Home Living Family/patient expects to be discharged to:: (not sure) Living Arrangements: Alone                    Prior Function Level of Independence: Independent with assistive device(s)               Hand Dominance        Extremity/Trunk Assessment   Upper Extremity Assessment Upper Extremity Assessment: Overall WFL for tasks assessed    Lower Extremity Assessment Lower Extremity Assessment: Overall WFL for tasks assessed       Communication   Communication: No difficulties  Cognition Arousal/Alertness: Awake/alert                                            General Comments      Exercises     Assessment/Plan    PT Assessment Patient needs continued PT services  PT Problem List Decreased strength;Decreased activity tolerance;Decreased balance;Decreased mobility;Pain       PT Treatment Interventions Gait training;Functional mobility training;Therapeutic activities;Therapeutic exercise;Balance training    PT Goals (Current goals can be found in the Care Plan section)  Acute Rehab PT Goals Patient Stated Goal: to go home PT Goal Formulation: Patient unable  to participate in goal setting Time For Goal Achievement: 01/11/19 Potential to Achieve Goals: Fair    Frequency Min 2X/week   Barriers to discharge (lives alone)      Co-evaluation               AM-PAC PT "6 Clicks" Mobility  Outcome Measure Help needed turning from your back to your side while in a flat bed without using bedrails?: A Little Help needed moving from lying on your back to sitting on the side of a flat bed without using bedrails?: A Little Help needed moving to and from a bed to a chair (including a wheelchair)?: A Little Help needed standing up from a chair using your arms (e.g., wheelchair or bedside chair)?: A Little Help needed to walk in hospital room?: A Little Help needed climbing 3-5 steps with a railing? :  A Little 6 Click Score: 18    End of Session Equipment Utilized During Treatment: Gait belt Activity Tolerance: Patient limited by pain;Patient limited by lethargy Patient left: in bed;with call bell/phone within reach;with bed alarm set   PT Visit Diagnosis: Other abnormalities of gait and mobility (R26.89);Muscle weakness (generalized) (M62.81);Difficulty in walking, not elsewhere classified (R26.2);Pain Pain - Right/Left: Right Pain - part of body: Leg    Time: 1330-1355 PT Time Calculation (min) (ACUTE ONLY): 25 min   Charges:     PT Treatments $Gait Training: 8-22 mins          Alanson Puls, PT DPT 12/28/2018, 3:31 PM

## 2018-12-28 NOTE — Plan of Care (Signed)

## 2018-12-28 NOTE — Plan of Care (Signed)
  Problem: Education: Goal: Knowledge of General Education information will improve Description: Including pain rating scale, medication(s)/side effects and non-pharmacologic comfort measures Outcome: Progressing   Problem: Clinical Measurements: Goal: Respiratory complications will improve Outcome: Progressing   Problem: Activity: Goal: Risk for activity intolerance will decrease Outcome: Not Progressing Note: Patient limited to movement due to pain but did sit on the edge of the bed and stood up briefly. This is the most she has done since she has been here.

## 2018-12-28 NOTE — Progress Notes (Signed)
Daughter, Marcie Bal, updated via phone. All questions answered.  Hyman Bible, MD

## 2018-12-29 LAB — BASIC METABOLIC PANEL
Anion gap: 8 (ref 5–15)
BUN: 11 mg/dL (ref 8–23)
CO2: 25 mmol/L (ref 22–32)
Calcium: 8.1 mg/dL — ABNORMAL LOW (ref 8.9–10.3)
Chloride: 109 mmol/L (ref 98–111)
Creatinine, Ser: 0.56 mg/dL (ref 0.44–1.00)
GFR calc Af Amer: >60 mL/min (ref >60)
GFR calc non Af Amer: >60 mL/min (ref >60)
Glucose, Bld: 101 mg/dL — ABNORMAL HIGH (ref 70–99)
Potassium: 3.3 mmol/L — ABNORMAL LOW (ref 3.5–5.1)
Sodium: 142 mmol/L (ref 135–145)

## 2018-12-29 LAB — MAGNESIUM: Magnesium: 1.9 mg/dL (ref 1.7–2.4)

## 2018-12-29 LAB — SARS CORONAVIRUS 2 BY RT PCR (HOSPITAL ORDER, PERFORMED IN ~~LOC~~ HOSPITAL LAB): SARS Coronavirus 2: NEGATIVE

## 2018-12-29 MED ORDER — POTASSIUM CHLORIDE CRYS ER 20 MEQ PO TBCR
40.0000 meq | EXTENDED_RELEASE_TABLET | Freq: Once | ORAL | Status: AC
  Administered 2018-12-29: 40 meq via ORAL
  Filled 2018-12-29: qty 2

## 2018-12-29 MED ORDER — AMOXICILLIN-POT CLAVULANATE 875-125 MG PO TABS
1.0000 | ORAL_TABLET | Freq: Two times a day (BID) | ORAL | Status: DC
  Filled 2018-12-29: qty 1

## 2018-12-29 MED ORDER — AMOXICILLIN-POT CLAVULANATE 875-125 MG PO TABS: 1.0000 | ORAL_TABLET | Freq: Two times a day (BID) | ORAL | 0 refills | Status: DC

## 2018-12-29 MED ORDER — MAGNESIUM SULFATE IN D5W 1-5 GM/100ML-% IV SOLN
1.0000 g | Freq: Once | INTRAVENOUS | Status: AC
  Administered 2018-12-29: 1 g via INTRAVENOUS
  Filled 2018-12-29: qty 100

## 2018-12-29 MED ORDER — AMOXICILLIN-POT CLAVULANATE 400-57 MG/5ML PO SUSR
875.0000 mg | Freq: Two times a day (BID) | ORAL | Status: DC
  Administered 2018-12-29 – 2018-12-30 (×3): 872 mg via ORAL
  Filled 2018-12-29 (×4): qty 10.9

## 2018-12-29 NOTE — TOC Transition Note (Signed)
Transition of Care [facility]) - CM/SW Discharge Note   Patient Details  Name: Claire Washington MRN: [id number] Date of Birth: 12-20-1924  Transition [facility] Care [facility]) CM/SW Contact:  Beverly Sessions, RN Phone Number: 12/29/2018, 11:42 AM   Clinical Narrative:    Patient to discharge today Will not need IV antibiotics at discharge   [facility] notified of discharge  Final next level of care: [place] Barriers to Discharge: Barriers Resolved   Patient Goals and CMS Choice Patient states their goals for this hospitalization and ongoing recovery are:: To not hurt anymore and go home CMS Medicare.gov Compare Post Acute Care list provided to:: Patient Choice offered to / list presented to : Patient  Discharge Placement                       Discharge Plan and Services   Discharge Planning Services: CM Consult Post Acute Care Choice: Home Health                    HH Arranged: RN, PT, OT, Nurse's Aide [facility] Agency: [facility]) Date [facility] Agency Contacted: 12/29/18 Time [place]: 1142 Representative spoke with at [place]: [place] ([place]) Interventions     Readmission Risk Interventions No flowsheet data found.

## 2018-12-29 NOTE — Progress Notes (Signed)
[place] at [facility]                                                                                                                                                                                  Patient Demographics   Claire Washington, is a 85 y.o. female, DOB - Dec 08, 1924, [id number]  Admit date - 12/26/2018   Admitting Physician Harrie Foreman, MD  Outpatient Primary MD for the patient is Tracie Harrier, MD   LOS - 2  Subjective: Patient feels like the redness and pain of her left leg is improving.  She has not had any fevers.  She did get up and work with physical therapy yesterday, and states that she thinks she did pretty well.  No other concerns this morning.   Review of Systems:   CONSTITUTIONAL: No documented fever. No fatigue, weakness. No weight gain, no weight loss.  EYES: No blurry or double vision.  ENT: No tinnitus. No postnasal drip. No redness of the oropharynx.  RESPIRATORY: No cough, no wheeze, no hemoptysis. No dyspnea.  CARDIOVASCULAR: No chest pain. No orthopnea. No palpitations. No syncope.  GASTROINTESTINAL: No nausea, no vomiting or diarrhea. No abdominal pain. No melena or hematochezia.  GENITOURINARY: No dysuria or hematuria.  ENDOCRINE: No polyuria or nocturia. No heat or cold intolerance.  HEMATOLOGY: No anemia. No bruising. No bleeding.  INTEGUMENTARY: No rashes. No lesions.  MUSCULOSKELETAL: +joint pain. No swelling. No gout. +redness of left lower leg NEUROLOGIC: No numbness, tingling, or ataxia. No seizure-type activity.  PSYCHIATRIC: No anxiety. No insomnia. No ADD.    Vitals:   Vitals:   12/28/18 1929 12/29/18 0433 12/29/18 0500 12/29/18 0733  BP: (!) 144/60 (!) 151/64  (!) 141/62  Pulse: 86 84  86  Resp: 20 20  20   Temp: 97.8 F (36.6 C) 98.2 F (36.8 C)  98.4 F (36.9 C)  TempSrc: Oral Oral  Oral  SpO2: 91% 92%  91%  Weight:   55.4 kg   Height:        Wt Readings from Last 3 Encounters:    12/29/18 55.4 kg  07/17/18 54.6 kg  06/13/18 55.3 kg     Intake/Output Summary (Last 24 hours) at 12/29/2018 1258 Last data filed at 12/29/2018 1002 Gross per 24 hour  Intake 2403.74 ml  Output 3200 ml  Net -796.26 ml    Physical Exam:   GENERAL: Pleasant-appearing in no apparent distress.  HEENT: Atraumatic, normocephalic. Extraocular muscles are intact. Pupils equal and reactive to light. Sclerae anicteric. No conjunctival injection. No oro-pharyngeal erythema.  NECK: Supple. There is no jugular venous distention. No bruits, no lymphadenopathy, no thyromegaly.  HEART:  Regular rate and rhythm,. No murmurs, no rubs, no clicks.  LUNGS: Clear to auscultation bilaterally. No rales or rhonchi. No wheezes.  ABDOMEN: Soft, flat, nontender, nondistended. Has good bowel sounds. No hepatosplenomegaly appreciated.  EXTREMITIES: No evidence of any cyanosis, clubbing, or peripheral edema.  +2 pedal and radial pulses bilaterally.  NEUROLOGIC: The patient is alert, awake, and oriented x3 with no focal motor or sensory deficits appreciated bilaterally.  SKIN: Moist and warm with no rashes appreciated. +left lower extremity with an abrasion and surrounding erythema and warmth. Psych: Not anxious, depressed      Antibiotics   Anti-infectives (From admission, onward)   Start     Dose/Rate Route Frequency Ordered Stop   12/29/18 1300  amoxicillin-clavulanate (AUGMENTIN) 875-125 MG per tablet 1 tablet     1 tablet Oral Every 12 hours 12/29/18 1254     12/29/18 0000  amoxicillin-clavulanate (AUGMENTIN) 875-125 MG tablet     1 tablet Oral 2 times daily 12/29/18 1033 01/03/19 2359   12/28/18 2200  vancomycin (VANCOCIN) 1,250 mg in sodium chloride 0.9 % 250 mL IVPB  Status:  Discontinued     1,250 mg 166.7 mL/hr over 90 Minutes Intravenous Every 48 hours 12/27/18 0749 12/27/18 1206   12/27/18 1300  Ampicillin-Sulbactam (UNASYN) 3 g in sodium chloride 0.9 % 100 mL IVPB  Status:  Discontinued     3  g 200 mL/hr over 30 Minutes Intravenous Every 6 hours 12/27/18 1208 12/29/18 1254   12/26/18 2245  vancomycin (VANCOCIN) 1,250 mg in sodium chloride 0.9 % 250 mL IVPB     1,250 mg 166.7 mL/hr over 90 Minutes Intravenous  Once 12/26/18 2235 12/27/18 0114      Medications   Scheduled Meds:  amLODipine  5 mg Oral BID   amoxicillin-clavulanate  1 tablet Oral Q12H   atorvastatin  10 mg Oral Daily   clopidogrel  75 mg Oral Daily   docusate sodium  100 mg Oral BID   enoxaparin (LOVENOX) injection  40 mg Subcutaneous Q24H   metoprolol tartrate  12.5 mg Oral BID   sodium chloride flush  3 mL Intravenous Once   vitamin B-12  1,000 mcg Oral Daily   Vitamin D (Ergocalciferol)  50,000 Units Oral Q7 days   Continuous Infusions:  sodium chloride 75 mL/hr at 12/29/18 1133   PRN Meds:.acetaminophen **OR** acetaminophen, cyclobenzaprine, diazepam, hydrALAZINE, ondansetron **OR** ondansetron (ZOFRAN) IV, traMADol   Data Review:   Micro Results Recent Results (from the past 240 hour(s))  Blood culture (routine x 2)     Status: None (Preliminary result)   Collection Time: 12/26/18  8:55 PM   Specimen: BLOOD  Result Value Ref Range Status   Specimen Description BLOOD RIGHT ANTECUBITAL  Final   Special Requests   Final    BOTTLES DRAWN AEROBIC AND ANAEROBIC Blood Culture adequate volume   Culture   Final    NO GROWTH 3 DAYS Performed at [facility], [address] [place], [place] [address]    Report Status PENDING  Incomplete  SARS Coronavirus 2 [facility] order, Performed in [facility] hospital lab) Nasopharyngeal Nasopharyngeal Swab     Status: None   Collection Time: 12/26/18  9:03 PM   Specimen: Nasopharyngeal Swab  Result Value Ref Range Status   SARS Coronavirus 2 NEGATIVE NEGATIVE Final    Comment: (NOTE) If result is NEGATIVE SARS-CoV-2 target nucleic acids are NOT DETECTED. The SARS-CoV-2 RNA is generally detectable in upper and lower  respiratory  specimens during  the acute phase of infection. The lowest  concentration of SARS-CoV-2 viral copies this assay can detect is 250  copies / mL. A negative result does not preclude SARS-CoV-2 infection  and should not be used as the sole basis for treatment or other  patient management decisions.  A negative result may occur with  improper specimen collection / handling, submission of specimen other  than nasopharyngeal swab, presence of viral mutation(s) within the  areas targeted by this assay, and inadequate number of viral copies  (<250 copies / mL). A negative result must be combined with clinical  observations, patient history, and epidemiological information. If result is POSITIVE SARS-CoV-2 target nucleic acids are DETECTED. The SARS-CoV-2 RNA is generally detectable in upper and lower  respiratory specimens dur ing the acute phase of infection.  Positive  results are indicative of active infection with SARS-CoV-2.  Clinical  correlation with patient history and other diagnostic information is  necessary to determine patient infection status.  Positive results do  not rule out bacterial infection or co-infection with other viruses. If result is PRESUMPTIVE POSTIVE SARS-CoV-2 nucleic acids MAY BE PRESENT.   A presumptive positive result was obtained on the submitted specimen  and confirmed on repeat testing.  While 2019 novel coronavirus  (SARS-CoV-2) nucleic acids may be present in the submitted sample  additional confirmatory testing may be necessary for epidemiological  and / or clinical management purposes  to differentiate between  SARS-CoV-2 and other Sarbecovirus currently known to infect humans.  If clinically indicated additional testing with an alternate test  methodology [phone]) is advised. The SARS-CoV-2 RNA is generally  detectable in upper and lower respiratory sp ecimens during the acute  phase of infection. The expected result is Negative. Fact Sheet for  Patients:  StrictlyIdeas.no Fact Sheet for Healthcare Providers: BankingDealers.co.za This test is not yet approved or cleared by the [place] FDA and has been authorized for detection and/or diagnosis of SARS-CoV-2 by FDA under an Emergency Use Authorization (EUA).  This EUA will remain in effect (meaning this test can be used) for the duration of the COVID-19 declaration under Section 564(b)(1) of the Act, 21 U.S.C. section 360bbb-3(b)(1), unless the authorization is terminated or revoked sooner. Performed at [facility], [address] [place], [place] [address]   Blood culture (routine x 2)     Status: None (Preliminary result)   Collection Time: 12/26/18 11:33 PM   Specimen: BLOOD  Result Value Ref Range Status   Specimen Description BLOOD RIGHT ANTECUBITAL  Final   Special Requests   Final    BOTTLES DRAWN AEROBIC AND ANAEROBIC Blood Culture results may not be optimal due to an excessive volume of blood received in culture bottles   Culture   Final    NO GROWTH 3 DAYS Performed at [facility], [address], [place], [place] [address]    Report Status PENDING  Incomplete    Radiology Reports Dg Tibia/fibula Left  Result Date: 12/26/2018 CLINICAL DATA:  Abrasion EXAM: LEFT TIBIA AND FIBULA - 2 VIEW COMPARISON:  None. FINDINGS: There is soft tissue edema and skin thickening over the lateral aspect of the lower leg. No subjacent osseous injury. Vascular calcifications are noted in the soft tissues. Geometric radiodensities noted in the posterior soft tissues of the lower leg, could be vascular calcium versus benign soft tissue mineralization versus less likely foreign body given depth within the tissues. Mild degenerative features are seen at the ankle and knee IMPRESSION: Soft tissue  edema and skin thickening over the lateral aspect of the lower leg without subjacent osseous injury. Radiodensity within the  posterior soft tissues, likely vascular calcium or soft tissue mineralization, less likely foreign body. Electronically Signed   By: Lovena Le M.D.   On: 12/26/2018 21:27   Ct Pelvis Wo Contrast  Result Date: 12/27/2018 CLINICAL DATA:  Recent fall, pelvic pain EXAM: CT PELVIS WITHOUT CONTRAST TECHNIQUE: Multidetector CT imaging of the pelvis was performed following the standard protocol without intravenous contrast. Sagittal and coronal MPR images reconstructed from axial data set. COMPARISON:  CT abdomen and pelvis 12/29/2014 FINDINGS: Urinary Tract: Well distended bladder without obvious mass. Distal ureters decompressed. Bowel: Sigmoid diverticulosis. Visualized large and small bowel loops otherwise unremarkable Vascular/Lymphatic: Atherosclerotic calcifications of aortic bifurcation and iliac arteries. No adenopathy. Reproductive: Uterus surgically absent with nonvisualization of ovaries Other:  No free air or free fluid.  No hernia. Musculoskeletal: Diffuse osseous demineralization. Facet degenerative changes at visualized lumbar spine. SI joints preserved. No fracture, dislocation, or bone destruction. Grade 1 anterolisthesis L4-L5 due to a combination of facet and disc degenerative changes. IMPRESSION: Osseous demineralization with degenerative disc/facet disease changes at lower lumbar spine. No acute osseous abnormalities. Sigmoid diverticulosis. Electronically Signed   By: Lavonia Dana M.D.   On: 12/27/2018 11:50     CBC Recent Labs  Lab 12/26/18 1556 12/28/18 0526  WBC 7.8 5.6  HGB 15.6* 12.6  HCT 45.9 37.5  PLT 232 178  MCV 86.1 87.2  MCH 29.3 29.3  MCHC 34.0 33.6  RDW 13.2 13.5  LYMPHSABS 2.0  --   MONOABS 0.6  --   EOSABS 0.2  --   BASOSABS 0.1  --     Chemistries  Recent Labs  Lab 12/26/18 1556 12/28/18 0526 12/29/18 0606  NA 139 143 142  K 3.7 3.3* 3.3*  CL 105 111 109  CO2 24 26 25   GLUCOSE 166* 105* 101*  BUN 14 10 11   CREATININE 0.69 0.54 0.56  CALCIUM 9.5  8.2* 8.1*  MG  --   --  1.9  AST 19  --   --   ALT 17  --   --   ALKPHOS 65  --   --   BILITOT 0.7  --   --    ------------------------------------------------------------------------------------------------------------------ estimated creatinine clearance is 33.9 mL/min (by C-G formula based on SCr of 0.56 mg/dL). ------------------------------------------------------------------------------------------------------------------ No results for input(s): HGBA1C in the last 72 hours. ------------------------------------------------------------------------------------------------------------------ No results for input(s): CHOL, HDL, LDLCALC, TRIG, CHOLHDL, LDLDIRECT in the last 72 hours. ------------------------------------------------------------------------------------------------------------------ Recent Labs    12/26/18 1556  TSH 4.292   ------------------------------------------------------------------------------------------------------------------ No results for input(s): VITAMINB12, FOLATE, FERRITIN, TIBC, IRON, RETICCTPCT in the last 72 hours.  Coagulation profile No results for input(s): INR, PROTIME in the last 168 hours.  No results for input(s): DDIMER in the last 72 hours.  Cardiac Enzymes No results for input(s): CKMB, TROPONINI, MYOGLOBIN in the last 168 hours.  Invalid input(s): CK ------------------------------------------------------------------------------------------------------------------ Invalid input(s): POCBNP    Assessment & Plan   Left leg cellulitis- improving. Patient was not meeting sepsis criteria on admission.  She did have a lactic acidosis on admission, but this has resolved. -Switch from IV Unasyn to p.o. Augmentin today -Blood cultures with no growth x 2 days -Stop IV fluids and encourage p.o. intake -PT recommending SNF.  Patient initially requested to be discharged home with home health, but she changed her mind after PT session today.  Now  agreeable to SNF.  Bilateral hip pain- improving -CT pelvis 9/11 with degenerative disc/facet disease in the lumbar spine -Continue Tylenol and tramadol as needed  Hypokalemia -Replete and recheck -Will give 1g magnesium  Hypertension- BPs have improved -Continue home Norvasc and metoprolol -IV hydralazine PRN  Hyperlipidemia- stable -Continue home statin  History of cerebrovascular disease -Continue plavix      Code Status Orders  (From admission, onward)         Start     Ordered   12/27/18 1029  Do not attempt resuscitation (DNR)  Continuous    Question Answer Comment  In the event of cardiac or respiratory ARREST Do not call a code blue   In the event of cardiac or respiratory ARREST Do not perform Intubation, CPR, defibrillation or ACLS   In the event of cardiac or respiratory ARREST Use medication by any route, position, wound care, and other measures to relive pain and suffering. May use oxygen, suction and manual treatment of airway obstruction as needed for comfort.      12/27/18 1028        Code Status History    Date Active Date Inactive Code Status Order ID Comments User Context   12/27/2018 0240 12/27/2018 1028 Full Code [id number]  Harrie Foreman, MD Inpatient   11/30/2017 1731 12/01/2017 1719 Full Code [id number]  Demetrios Loll, MD Inpatient   10/19/2016 2251 10/23/2016 1717 Full Code [id number]  Hugelmeyer, Ubaldo Glassing, DO Inpatient   Advance Care Planning Activity    Advance Directive Documentation     Most Recent Value  Type of Advance Directive  Healthcare Power of Attorney  Pre-existing out of facility DNR order (yellow form or pink MOST form)  --  "MOST" Form in Place?  --      Consults none  DVT Prophylaxis  Lovenox  Lab Results  Component Value Date   PLT 178 12/28/2018     Time Spent in minutes   40 minutes  Greater than 50% of time spent in care coordination and counseling patient regarding the condition and plan of care.   Berna Spare Yasser Hepp  M.D on 12/29/2018 at 12:58 PM  Between 7am to 6pm - Pager - [phone]  After 6pm go to [email or web address] - Proofreader  [employer]   Office  [phone]

## 2018-12-29 NOTE — TOC Progression Note (Signed)
Transition of Care [facility]) - Progression Note    Patient Details  Name: Claire Washington MRN: [id number] Date of Birth: 1924/10/28  [facility] CM/SW Contact  Beverly Sessions, RN Phone Number: 12/29/2018, 10:25 AM  Clinical Narrative:    PT has assessed patient and recommends SNF.  RNCM met with patient to discuss recommendation.   Patient adamantly declines.  She states "I have been so careful with health that I am not willing to risk it by going to a facility"  RNCM explained precautions that SNFs are taking.  She still declines at this time.  Informed her if she changes her mind just to let us know.   Plan still for [place] at discharge.    Expected Discharge Plan: [place] Barriers to Discharge: Continued Medical Work up  Expected Discharge Plan and Services Expected Discharge Plan: [place]   Discharge Planning Services: CM Consult Post Acute Care Choice: [place] arrangements for the past 2 months: Single Family Home                           HH Arranged: RN, PT [facility] Agency: [place] ([facility]) Date [place]: 12/27/18 Time [place]: 1514 Representative spoke with at [place]: [place] ([place]) Interventions    Readmission Risk Interventions No flowsheet data found.

## 2018-12-29 NOTE — NC FL2 (Signed)
[place] LEVEL OF CARE SCREENING TOOL     IDENTIFICATION  Patient Name: Claire Washington Birthdate: [birth year] Sex: female Admission Date (Current Location): 12/26/2018  [facility] and [place] Number:  Engineering geologist and Address:         Provider Number: [id number]  Attending Physician Name and Address:  Mayo, Pete Pelt, MD  Relative Name and Phone Number:       Current Level of Care: Hospital Recommended Level of Care: [place] Prior Approval Number:    Date Approved/Denied:   PASRR Number: [id number]  Discharge Plan: SNF    Current Diagnoses: Patient Active Problem List   Diagnosis Date Noted  . Cellulitis 12/27/2018  . Carotid stenosis 12/28/2017  . Vertigo 11/30/2017  . Gait instability 10/19/2016  . Dizziness 11/03/2015  . Hypertension 12/04/2011  . Hyperlipidemia 12/04/2011  . Syncope 12/04/2011  . Diabetes mellitus ([place]) 12/04/2011  . Smoking hx 12/04/2011    Orientation RESPIRATION BLADDER Height & Weight     Self, Time, Situation, Place  Normal Incontinent Weight: 55.4 kg Height:  5\' 1"  (154.9 cm)  BEHAVIORAL SYMPTOMS/MOOD NEUROLOGICAL BOWEL NUTRITION STATUS      Continent Diet(Heart healthy)  AMBULATORY STATUS COMMUNICATION OF NEEDS Skin   Limited Assist Verbally Other (Comment)(Left leg cellulitis)                       Personal Care Assistance Level of Assistance              Functional Limitations Info             SPECIAL CARE FACTORS FREQUENCY  PT (By licensed PT), OT (By licensed OT)                    Contractures Contractures Info: Not present    Additional Factors Info  Code Status, Allergies Code Status Info: DNR Allergies Info: Hydrocortisone, sulfa antibiotics, tape           Current Medications (12/29/2018):  This is the current hospital active medication list Current Facility-Administered Medications  Medication Dose Route Frequency Provider Last Rate Last  Dose  . acetaminophen (TYLENOL) tablet 650 mg  650 mg Oral Q6H PRN Harrie Foreman, MD       Or  . acetaminophen (TYLENOL) suppository 650 mg  650 mg Rectal Q6H PRN Harrie Foreman, MD      . amLODipine (NORVASC) tablet 5 mg  5 mg Oral BID Harrie Foreman, MD   5 mg at 12/29/18 0931  . amoxicillin-clavulanate (AUGMENTIN) 875-125 MG per tablet 1 tablet  1 tablet Oral Q12H Mayo, Pete Pelt, MD   1 tablet at 12/29/18 1308  . atorvastatin (LIPITOR) tablet 10 mg  10 mg Oral Daily Harrie Foreman, MD   10 mg at 12/28/18 1806  . clopidogrel (PLAVIX) tablet 75 mg  75 mg Oral Daily Harrie Foreman, MD   75 mg at 12/29/18 0932  . cyclobenzaprine (FLEXERIL) tablet 10 mg  10 mg Oral TID PRN Mansy, Jan A, MD   10 mg at 12/27/18 1557  . diazepam (VALIUM) tablet 2 mg  2 mg Oral TID PRN Harrie Foreman, MD   2 mg at 12/27/18 0317  . docusate sodium (COLACE) capsule 100 mg  100 mg Oral BID Harrie Foreman, MD   100 mg at 12/29/18 0932  . enoxaparin (LOVENOX) injection 40 mg  40 mg Subcutaneous Q24H Harrie Foreman, MD  40 mg at 12/29/18 [id number]  . hydrALAZINE (APRESOLINE) tablet 25 mg  25 mg Oral TID PRN Harrie Foreman, MD      . metoprolol tartrate (LOPRESSOR) tablet 12.5 mg  12.5 mg Oral BID Harrie Foreman, MD   12.5 mg at 12/29/18 0931  . ondansetron (ZOFRAN) tablet 4 mg  4 mg Oral Q6H PRN Harrie Foreman, MD       Or  . ondansetron [facility]) injection 4 mg  4 mg Intravenous Q6H PRN Harrie Foreman, MD      . sodium chloride flush (NS) 0.9 % injection 3 mL  3 mL Intravenous Once Harrie Foreman, MD      . traMADol Veatrice Bourbon) tablet 50 mg  50 mg Oral Q6H PRN Dustin Flock, MD   50 mg at 12/29/18 0640  . vitamin B-12 (CYANOCOBALAMIN) tablet 1,000 mcg  1,000 mcg Oral Daily Harrie Foreman, MD   1,000 mcg at 12/29/18 0932  . Vitamin D (Ergocalciferol) (DRISDOL) capsule 50,000 Units  50,000 Units Oral Q7 days Harrie Foreman, MD   50,000 Units at 12/29/18 [id number]      Discharge Medications: Please see discharge summary for a list of discharge medications.  Relevant Imaging Results:  Relevant Lab Results:   Additional Information SSN:  [id number]  Beverly Sessions, RN

## 2018-12-29 NOTE — Plan of Care (Signed)
  Problem: Education: Goal: Knowledge of General Education information will improve Description: Including pain rating scale, medication(s)/side effects and non-pharmacologic comfort measures Outcome: Progressing   Problem: Health Behavior/Discharge Planning: Goal: Ability to manage health-related needs will improve Outcome: Progressing Note: There seems to be some confusion as to whether or not patient is appropriate for d/c home or to a SNF (recommended). Discharge order from earlier today cancelled after PT worked with the patient and made a different recommendation than what the patient was hoping for. Swabbed for COVID-19 in anticipation of patient going to a SNF tomorrow. Will continue to monitor d/c progression. Wenda Low [facility]

## 2018-12-29 NOTE — TOC Progression Note (Signed)
Transition of Care [facility]) - Progression Note    Patient Details  Name: Claire Washington MRN: [id number] Date of Birth: [birth year]  Transition of Care [facility]) CM/SW Contact  Beverly Sessions, RN Phone Number: 12/29/2018, 1:28 PM  Clinical Narrative:    Last minute patient changed her mind and wishes to pursue SNF.  RNCM did a 3 way call with patient and daughter.  Daughter in agreement that patient needs SNF at discharge.   Patient only in agreement for clinical to be sent to [facility] and [facility].  Info sent in the [place].   RNCM checked with both facilities and neither take admissions over the weekend.  MD, daughter, and patient notified.   MD to order repeat covid  Patient has existing Coralie Carpen with [place] notified    Expected Discharge Plan: [place] Barriers to Discharge: Barriers Resolved  Expected Discharge Plan and Services Expected Discharge Plan: [place]   Discharge Planning Services: CM Consult Post Acute Care Choice: [place] arrangements for the past 2 months: Single Family Home Expected Discharge Date: 12/29/18                         HH Arranged: RN, PT, OT, Nurse's Aide [place] Agency: [place] ([place]) Date HH Agency Contacted: 12/29/18 Time [place]: 1142 Representative spoke with at [place]: [place] ([place]) Interventions    Readmission Risk Interventions No flowsheet data found.

## 2018-12-29 NOTE — Progress Notes (Signed)
Physical Therapy Treatment Patient Details Name: Claire Washington MRN: [id number] DOB: 08-25-1924 Today's Date: 12/29/2018    History of Present Illness HPI: The patient with past medical history of embolic stroke, diabetes, hypertension and hyperlipidemia presents to the emergency department due to right lower extremity wound.  The patient ports injuring her left leg after a fall last week.     PT Comments    Patient is sitting up in recliner when approached for PT today. She transfers with SBA from sit to stand with 3 wheeled rollator. Her initial static standing balance is fair and needs BUE for support. She ambulates 120 feet with 3 wheeled rollator with unsteady gait, fatigue and weakness in her LE's. She reports feeling tightness and discomfort posterior knees bilaterally. Patients standing tolerance and gait distances is decreased and this would limit her going home safely. She will benefit from skilled PT to improve strength, mobility and safety. Patient agrees that she is not strong enough to go home by herself and SNF is recommended by the PT.   Follow Up Recommendations  SNF     Equipment Recommendations  None recommended by PT    Recommendations for Other Services       Precautions / Restrictions Precautions Precautions: Fall Restrictions Weight Bearing Restrictions: No    Mobility  Bed Mobility                  Transfers Overall transfer level: Modified independent Equipment used: (3 wheeled walker)             General transfer comment: needs VC for safety  Ambulation/Gait Ambulation/Gait assistance: Min guard Gait Distance (Feet): 120 Feet Assistive device: (3 wheeled walker) Gait Pattern/deviations: Step-to pattern     General Gait Details: (unsteady and slow gait speed)   Stairs             Wheelchair Mobility    Modified Rankin (Stroke Patients Only)       Balance Overall balance assessment: Needs  assistance Sitting-balance support: Feet supported Sitting balance-Leahy Scale: Good     Standing balance support: Bilateral upper extremity supported Standing balance-Leahy Scale: Good                              Cognition Arousal/Alertness: Awake/alert Behavior During Therapy: WFL for tasks assessed/performed Overall Cognitive Status: Within Functional Limits for tasks assessed                                        Exercises      General Comments        Pertinent Vitals/Pain Pain Assessment: 0-10 Pain Score: (says that behind her knees are stiff and her legs) Pain Location: behind her knees Pain Descriptors / Indicators: Sore Pain Intervention(s): Limited activity within patient's tolerance;Monitored during session    Home Living                      Prior Function            PT Goals (current goals can now be found in the care plan section) Acute Rehab PT Goals Patient Stated Goal: to go home PT Goal Formulation: With patient Time For Goal Achievement: 01/11/19 Potential to Achieve Goals: Fair    Frequency    Min 2X/week      PT Plan Current  plan remains appropriate    Co-evaluation              AM-PAC PT "6 Clicks" Mobility   Outcome Measure  Help needed turning from your back to your side while in a flat bed without using bedrails?: A Little Help needed moving from lying on your back to sitting on the side of a flat bed without using bedrails?: A Little Help needed moving to and from a bed to a chair (including a wheelchair)?: A Little Help needed standing up from a chair using your arms (e.g., wheelchair or bedside chair)?: A Little Help needed to walk in hospital room?: A Little Help needed climbing 3-5 steps with a railing? : A Little 6 Click Score: 18    End of Session Equipment Utilized During Treatment: Gait belt Activity Tolerance: Patient limited by fatigue(limited by LE weakness and  stiffness) Patient left: in chair;with chair alarm set   PT Visit Diagnosis: Other abnormalities of gait and mobility (R26.89);Muscle weakness (generalized) (M62.81);Difficulty in walking, not elsewhere classified (R26.2);Pain Pain - Right/Left: Right Pain - part of body: Leg     Time: 1150-1205 PT Time Calculation (min) (ACUTE ONLY): 15 min  Charges:  $Gait Training: 8-22 mins                       Arelia Sneddon S , PT  12/29/2018, 12:25 PM

## 2018-12-29 NOTE — Discharge Instructions (Signed)
It was so nice to meet you during this hospitalization!  You came into the hospital with a bacterial infection of your left leg. We gave you some antibiotics through your IV and your infection got much better. I have prescribed some augmentin for you to go home with. This is your antibiotic. Please take 1 tablet tonight and then continue taking it twice a day for 4 more days.  Take care, Dr. Brett Albino

## 2018-12-30 LAB — BASIC METABOLIC PANEL
Anion gap: 4 — ABNORMAL LOW (ref 5–15)
BUN: 15 mg/dL (ref 8–23)
CO2: 27 mmol/L (ref 22–32)
Calcium: 8.3 mg/dL — ABNORMAL LOW (ref 8.9–10.3)
Chloride: 109 mmol/L (ref 98–111)
Creatinine, Ser: 0.53 mg/dL (ref 0.44–1.00)
GFR calc Af Amer: >60 mL/min (ref >60)
GFR calc non Af Amer: >60 mL/min (ref >60)
Glucose, Bld: 101 mg/dL — ABNORMAL HIGH (ref 70–99)
Potassium: 3.8 mmol/L (ref 3.5–5.1)
Sodium: 140 mmol/L (ref 135–145)

## 2018-12-30 LAB — MAGNESIUM: Magnesium: 2.2 mg/dL (ref 1.7–2.4)

## 2018-12-30 MED ORDER — FLEET ENEMA 7-19 GM/118ML RE ENEM
1.0000 | ENEMA | Freq: Once | RECTAL | Status: AC
  Administered 2018-12-30: 11:00:00 1 via RECTAL

## 2018-12-30 MED ORDER — AMOXICILLIN-POT CLAVULANATE 400-57 MG/5ML PO SUSR: 875.0000 mg | Freq: Two times a day (BID) | ORAL | 0 refills | Status: DC

## 2018-12-30 NOTE — TOC Transition Note (Signed)
Transition of Care [facility]) - CM/SW Discharge Note   Patient Details  Name: Claire Washington MRN: [id number] Date of Birth: Aug 13, 1924  Transition of Care [facility]) CM/SW Contact:  Elza Rafter, RN Phone Number: 12/30/2018, 1:38 PM   Clinical Narrative:   Patient discharging to [facility] today.  Lonn [place] at [facility] [employer] RN may call report and send via EMS.  Discharge packet placed on chart with DNR.  Patient states she will tell her daughter about discharge.      Final next level of care: [place] Barriers to Discharge: Barriers Resolved   Patient Goals and CMS Choice Patient states their goals for this hospitalization and ongoing recovery are:: To not hurt anymore and go home CMS Medicare.gov Compare Post Acute Care list provided to:: Patient Choice offered to / list presented to : Patient  Discharge Placement                       Discharge Plan and Services   Discharge Planning Services: CM Consult Post Acute Care Choice: Home Health                    HH Arranged: RN, PT, OT, Nurse's Aide [facility] Agency: [place] ([place]) Date [facility] Agency Contacted: 12/29/18 Time [place]: 1142 Representative spoke with at [place]: [place] ([place]) Interventions     Readmission Risk Interventions No flowsheet data found.

## 2018-12-30 NOTE — Progress Notes (Signed)
Report given to Angela Loyd,RN  

## 2018-12-30 NOTE — Care Management Important Message (Signed)
Important Message  Patient Details  Name: Claire Washington MRN: [id number] Date of Birth: 05/30/1924   Medicare Important Message Given:  Yes     Dannette Barbara 12/30/2018, 11:25 AM

## 2018-12-30 NOTE — Discharge Summary (Addendum)
[place] at [place] NAME: Claire Washington    MR#:  [id number]  DATE OF BIRTH:  Oct 11, 1924  DATE OF ADMISSION:  12/26/2018   ADMITTING PHYSICIAN: Harrie Foreman, MD  DATE OF DISCHARGE: 12/30/18  PRIMARY CARE PHYSICIAN: Tracie Harrier, MD   ADMISSION DIAGNOSIS:  Cellulitis of left lower extremity [id number] DISCHARGE DIAGNOSIS:  Active Problems:   Cellulitis  SECONDARY DIAGNOSIS:   Past Medical History:  Diagnosis Date  . Arthritis   . Basal cell carcinoma    basel cell  . Chronic cystitis   . Diabetes mellitus type 2, controlled, without complications ([place])    type 2 or unspecified type diabetes mellitus without mention of complication, not stated as uncontrolled  . Disease of thyroid gland   . Embolic stroke ([place])    a. 07/2016 MRI Brain: small right superior cerebellum and left lateral occipital cortex infarcts;  b. 08/2016 Echo: EF 60-65%, Gr1 DD, mild MR;  c. 08/2016 30 Day Event Monitor: No significant tachy/bradyarrhythmias. No afib.  . Follicular neoplasm of thyroid   . Hemorrhage of rectum and anus   . Hyperlipidemia   . Hyperlipidemia, unspecified   . Hypertension   . Leg pain   . Osteoarthritis   . Pernicious anemia   . Small vessel disease ([place])   . Spinal stenosis, lumbar region without neurogenic claudication   . Subdural hematoma ([place])    a. 2013 in setting of syncope.  . Syncope and collapse    a. 2013 - syncope and fall w/ resultant subdural hematoma; b. prev nl echo and stress test @ [facility].  Marland Kitchen Thyroid nodule   . Vertigo & Chronic Dizziness    a. Previously seen by ENT with Physical Therapy.  MRI 2000 small vessel disease   HOSPITAL COURSE:   Dorinda is a 85 year old female who presented to the ED with redness and swelling of her left lower leg after developing an abrasion a few days prior. She went to her PCP's office and was prescribed doxycycline. She took the antibiotic as prescribed, but her  lower extremity edema and erythema continued to worsen. She was admitted for further management.  Left leg cellulitis- improved. Patient was not meeting sepsis criteria on admission.  She did have a lactic acidosis on admission, but this resolved. -Treated with unasyn and then transitioned to augmentin for a total 7 day course of antibiotics -Blood cultures with no growth -PT recommended SNF  Bilateral hip pain- improving -CT pelvis 9/11 with degenerative disc/facet disease in the lumbar spine -Continue Tylenol as needed  Hypertension- BPs were well-controlled -Continued home Norvasc and metoprolol -Continued hydralazine 25mg  tid prn SBP > 170  Hyperlipidemia- stable -Continued home statin  History of cerebrovascular disease -Continued plavix   DISCHARGE CONDITIONS:  Left leg cellulitis Bilateral hip pain Hypertension Hyperlipidemia History of cerebrovascular disease CONSULTS OBTAINED:  None DRUG ALLERGIES:   Allergies  Allergen Reactions  . Hydrocortisone Swelling  . Sulfa Antibiotics Other (See Comments)    Reaction: unknown  . Tape Dermatitis   DISCHARGE MEDICATIONS:   Allergies as of 12/30/2018      Reactions   Hydrocortisone Swelling   Sulfa Antibiotics Other (See Comments)   Reaction: unknown   Tape Dermatitis      Medication List    STOP taking these medications   ciprofloxacin 250 MG tablet Commonly known as: CIPRO   diazepam 2 MG tablet Commonly known as: VALIUM   doxycycline 100  MG capsule Commonly known as: VIBRAMYCIN     TAKE these medications   amLODipine 5 MG tablet Commonly known as: NORVASC Take 1 tablet (5 mg total) by mouth 2 (two) times daily.   amoxicillin-clavulanate 400-57 MG/5ML suspension Commonly known as: AUGMENTIN Take 10.9 mLs (872 mg total) by mouth every 12 (twelve) hours.   atorvastatin 10 MG tablet Commonly known as: LIPITOR TAKE 1 TABLET BY MOUTH EVERY DAY   CENTRUM SILVER PO Take 1 tablet by mouth daily.  Reported on 06/11/2015   clopidogrel 75 MG tablet Commonly known as: PLAVIX TAKE 1 TABLET (75 MG TOTAL) BY MOUTH DAILY.   hydrALAZINE 25 MG tablet Commonly known as: APRESOLINE Take 1 tablet (25 mg total) by mouth 3 (three) times daily as needed. As needed for systolic blood pressure [id number].   metoprolol tartrate 25 MG tablet Commonly known as: LOPRESSOR Take 0.5 tablets (12.5 mg total) by mouth 2 (two) times daily.   mupirocin ointment 2 % Commonly known as: BACTROBAN Apply 1 application topically 2 (two) times daily. For 7 days   vitamin B-12 1000 MCG tablet Commonly known as: CYANOCOBALAMIN Take by mouth.   Vitamin D (Ergocalciferol) 1.25 MG (50000 UT) Caps capsule Commonly known as: DRISDOL Take 50,000 Units by mouth every 7 (seven) days.        DISCHARGE INSTRUCTIONS:  1. F/u with PCP in 5 days 2. Take augmentin as prescribed for 5 more days DIET:  Cardiac diet DISCHARGE CONDITION:  Stable ACTIVITY:  Activity as tolerated OXYGEN:  Home Oxygen: No.  Oxygen Delivery: room air DISCHARGE LOCATION:  nursing home   If you experience worsening of your admission symptoms, develop shortness of breath, life threatening emergency, suicidal or homicidal thoughts you must seek medical attention immediately by calling 911 or calling your MD immediately  if symptoms less severe.  You Must read complete instructions/literature along with all the possible adverse reactions/side effects for all the Medicines you take and that have been prescribed to you. Take any new Medicines after you have completely understood and accpet all the possible adverse reactions/side effects.   Please note  You were cared for by a hospitalist during your hospital stay. If you have any questions about your discharge medications or the care you received while you were in the hospital after you are discharged, you can call the unit and asked to speak with the hospitalist on call if the hospitalist that  took care of you is not available. Once you are discharged, your primary care physician will handle any further medical issues. Please note that NO REFILLS for any discharge medications will be authorized once you are discharged, as it is imperative that you return to your primary care physician (or establish a relationship with a primary care physician if you do not have one) for your aftercare needs so that they can reassess your need for medications and monitor your lab values.    On the day of Discharge:  VITAL SIGNS:  Blood pressure (!) 145/66, pulse 79, temperature 98.9 F (37.2 C), temperature source Oral, resp. rate 19, height 5\' 1"  (1.549 m), weight 55.2 kg, SpO2 96 %. PHYSICAL EXAMINATION:  GENERAL:  85 y.o.-year-old patient lying in the bed with no acute distress.  EYES: Pupils equal, round, reactive to light and accommodation. No scleral icterus. Extraocular muscles intact.  HEENT: Head atraumatic, normocephalic. Oropharynx and nasopharynx clear.  NECK:  Supple, no jugular venous distention. No thyroid enlargement, no tenderness.  LUNGS: Normal breath sounds bilaterally,  no wheezing, rales,rhonchi or crepitation. No use of accessory muscles of respiration.  CARDIOVASCULAR: S1, S2 normal. No murmurs, rubs, or gallops.  ABDOMEN: Soft, non-tender, non-distended. Bowel sounds present. No organomegaly or mass.  EXTREMITIES: No pedal edema, cyanosis, or clubbing. +left leg with abrasion present and some surrounding erythema and warmth, improved from previous exam. NEUROLOGIC: Cranial nerves II through XII are intact. +global weakness. Sensation intact. Gait not checked.  PSYCHIATRIC: The patient is alert and oriented x 3.  SKIN: No obvious rash. +abrasion present on left shin DATA REVIEW:   CBC Recent Labs  Lab 12/28/18 0526  WBC 5.6  HGB 12.6  HCT 37.5  PLT 178    Chemistries  Recent Labs  Lab 12/26/18 1556  12/30/18 0358  NA 139   < > 140  K 3.7   < > 3.8  CL 105   < >  109  CO2 24   < > 27  GLUCOSE 166*   < > 101*  BUN 14   < > 15  CREATININE 0.69   < > 0.53  CALCIUM 9.5   < > 8.3*  MG  --    < > 2.2  AST 19  --   --   ALT 17  --   --   ALKPHOS 65  --   --   BILITOT 0.7  --   --    < > = values in this interval not displayed.     Microbiology Results  Results for orders placed or performed during the hospital encounter of 12/26/18  Blood culture (routine x 2)     Status: None (Preliminary result)   Collection Time: 12/26/18  8:55 PM   Specimen: BLOOD  Result Value Ref Range Status   Specimen Description BLOOD RIGHT ANTECUBITAL  Final   Special Requests   Final    BOTTLES DRAWN AEROBIC AND ANAEROBIC Blood Culture adequate volume   Culture   Final    NO GROWTH 4 DAYS Performed at [facility], [address] [place], [place] [address]    Report Status PENDING  Incomplete  SARS Coronavirus 2 [facility] order, Performed in [place] hospital lab) Nasopharyngeal Nasopharyngeal Swab     Status: None   Collection Time: 12/26/18  9:03 PM   Specimen: Nasopharyngeal Swab  Result Value Ref Range Status   SARS Coronavirus 2 NEGATIVE NEGATIVE Final    Comment: (NOTE) If result is NEGATIVE SARS-CoV-2 target nucleic acids are NOT DETECTED. The SARS-CoV-2 RNA is generally detectable in upper and lower  respiratory specimens during the acute phase of infection. The lowest  concentration of SARS-CoV-2 viral copies this assay can detect is 250  copies / mL. A negative result does not preclude SARS-CoV-2 infection  and should not be used as the sole basis for treatment or other  patient management decisions.  A negative result may occur with  improper specimen collection / handling, submission of specimen other  than nasopharyngeal swab, presence of viral mutation(s) within the  areas targeted by this assay, and inadequate number of viral copies  (<250 copies / mL). A negative result must be combined with clinical  observations, patient  history, and epidemiological information. If result is POSITIVE SARS-CoV-2 target nucleic acids are DETECTED. The SARS-CoV-2 RNA is generally detectable in upper and lower  respiratory specimens dur ing the acute phase of infection.  Positive  results are indicative of active infection with SARS-CoV-2.  Clinical  correlation with patient history and other  diagnostic information is  necessary to determine patient infection status.  Positive results do  not rule out bacterial infection or co-infection with other viruses. If result is PRESUMPTIVE POSTIVE SARS-CoV-2 nucleic acids MAY BE PRESENT.   A presumptive positive result was obtained on the submitted specimen  and confirmed on repeat testing.  While 2019 novel coronavirus  (SARS-CoV-2) nucleic acids may be present in the submitted sample  additional confirmatory testing may be necessary for epidemiological  and / or clinical management purposes  to differentiate between  SARS-CoV-2 and other Sarbecovirus currently known to infect humans.  If clinically indicated additional testing with an alternate test  methodology [phone]) is advised. The SARS-CoV-2 RNA is generally  detectable in upper and lower respiratory sp ecimens during the acute  phase of infection. The expected result is Negative. Fact Sheet for Patients:  StrictlyIdeas.no Fact Sheet for Healthcare Providers: BankingDealers.co.za This test is not yet approved or cleared by the [place] FDA and has been authorized for detection and/or diagnosis of SARS-CoV-2 by FDA under an Emergency Use Authorization (EUA).  This EUA will remain in effect (meaning this test can be used) for the duration of the COVID-19 declaration under Section 564(b)(1) of the Act, 21 U.S.C. section 360bbb-3(b)(1), unless the authorization is terminated or revoked sooner. Performed at [facility], [address] [place], [place] [address]    Blood culture (routine x 2)     Status: None (Preliminary result)   Collection Time: 12/26/18 11:33 PM   Specimen: BLOOD  Result Value Ref Range Status   Specimen Description BLOOD RIGHT ANTECUBITAL  Final   Special Requests   Final    BOTTLES DRAWN AEROBIC AND ANAEROBIC Blood Culture results may not be optimal due to an excessive volume of blood received in culture bottles   Culture   Final    NO GROWTH 4 DAYS Performed at [facility], [address] [place], [place] [address]    Report Status PENDING  Incomplete  SARS Coronavirus 2 [facility] order, Performed in [facility] hospital lab) Nasopharyngeal Nasopharyngeal Swab     Status: None   Collection Time: 12/29/18  1:50 PM   Specimen: Nasopharyngeal Swab  Result Value Ref Range Status   SARS Coronavirus 2 NEGATIVE NEGATIVE Final    Comment: (NOTE) If result is NEGATIVE SARS-CoV-2 target nucleic acids are NOT DETECTED. The SARS-CoV-2 RNA is generally detectable in upper and lower  respiratory specimens during the acute phase of infection. The lowest  concentration of SARS-CoV-2 viral copies this assay can detect is 250  copies / mL. A negative result does not preclude SARS-CoV-2 infection  and should not be used as the sole basis for treatment or other  patient management decisions.  A negative result may occur with  improper specimen collection / handling, submission of specimen other  than nasopharyngeal swab, presence of viral mutation(s) within the  areas targeted by this assay, and inadequate number of viral copies  (<250 copies / mL). A negative result must be combined with clinical  observations, patient history, and epidemiological information. If result is POSITIVE SARS-CoV-2 target nucleic acids are DETECTED. The SARS-CoV-2 RNA is generally detectable in upper and lower  respiratory specimens dur ing the acute phase of infection.  Positive  results are indicative of active infection with SARS-CoV-2.   Clinical  correlation with patient history and other diagnostic information is  necessary to determine patient infection status.  Positive results do  not rule out bacterial infection or  co-infection with other viruses. If result is PRESUMPTIVE POSTIVE SARS-CoV-2 nucleic acids MAY BE PRESENT.   A presumptive positive result was obtained on the submitted specimen  and confirmed on repeat testing.  While 2019 novel coronavirus  (SARS-CoV-2) nucleic acids may be present in the submitted sample  additional confirmatory testing may be necessary for epidemiological  and / or clinical management purposes  to differentiate between  SARS-CoV-2 and other Sarbecovirus currently known to infect humans.  If clinically indicated additional testing with an alternate test  methodology [id number]) is advised. The SARS-CoV-2 RNA is generally  detectable in upper and lower respiratory sp ecimens during the acute  phase of infection. The expected result is Negative. Fact Sheet for Patients:  StrictlyIdeas.no Fact Sheet for Healthcare Providers: BankingDealers.co.za This test is not yet approved or cleared by the [place] FDA and has been authorized for detection and/or diagnosis of SARS-CoV-2 by FDA under an Emergency Use Authorization (EUA).  This EUA will remain in effect (meaning this test can be used) for the duration of the COVID-19 declaration under Section 564(b)(1) of the Act, 21 U.S.C. section 360bbb-3(b)(1), unless the authorization is terminated or revoked sooner. Performed at [facility] [address], [place], [place] [address]     RADIOLOGY:  No results found.   Management plans discussed with the patient, family and they are in agreement.  CODE STATUS: DNR   TOTAL TIME TAKING CARE OF THIS PATIENT: 40 minutes.    Berna Spare  M.D on 12/30/2018 at 9:52 AM  Between 7am to 6pm - Pager - [phone]  After 6pm go to  [email or web address] - Proofreader  [facility]  Office  [phone]  CC: Primary care physician; Tracie Harrier, MD   Note: This dictation was prepared with Dragon dictation along with smaller phrase technology. Any transcriptional errors that result from this process are unintentional.

## 2018-12-31 DIAGNOSIS — M25551 Pain in right hip: Secondary | ICD-10-CM | POA: Insufficient documentation

## 2018-12-31 DIAGNOSIS — L03116 Cellulitis of left lower limb: Secondary | ICD-10-CM | POA: Insufficient documentation

## 2018-12-31 LAB — CULTURE, BLOOD (ROUTINE X 2)
Culture: NO GROWTH
Culture: NO GROWTH
Special Requests: ADEQUATE

## 2019-02-07 ENCOUNTER — Other Ambulatory Visit (HOSPITAL_COMMUNITY): Payer: Self-pay | Admitting: Internal Medicine

## 2019-02-07 ENCOUNTER — Other Ambulatory Visit: Payer: Self-pay

## 2019-02-07 ENCOUNTER — Other Ambulatory Visit: Payer: Self-pay | Admitting: Internal Medicine

## 2019-02-07 ENCOUNTER — Ambulatory Visit
Admission: RE | Admit: 2019-02-07 | Discharge: 2019-02-07 | Disposition: A | Discharge status: Home / Self Care | Payer: Medicare Other | Source: Ambulatory Visit | Attending: Internal Medicine | Admitting: Internal Medicine

## 2019-02-07 DIAGNOSIS — L03119 Cellulitis of unspecified part of limb: Secondary | ICD-10-CM

## 2019-02-07 DIAGNOSIS — R600 Localized edema: Secondary | ICD-10-CM | POA: Diagnosis not present

## 2019-02-20 ENCOUNTER — Emergency Department: Payer: Medicare Other

## 2019-02-20 ENCOUNTER — Encounter: Payer: Self-pay | Admitting: Emergency Medicine

## 2019-02-20 ENCOUNTER — Other Ambulatory Visit: Payer: Self-pay

## 2019-02-20 ENCOUNTER — Emergency Department
Admission: EM | Admit: 2019-02-20 | Discharge: 2019-02-20 | Disposition: A | Discharge status: Home / Self Care | Payer: Medicare Other | Attending: Emergency Medicine | Admitting: Emergency Medicine

## 2019-02-20 DIAGNOSIS — E119 Type 2 diabetes mellitus without complications: Secondary | ICD-10-CM | POA: Diagnosis not present

## 2019-02-20 DIAGNOSIS — Z87891 Personal history of nicotine dependence: Secondary | ICD-10-CM | POA: Insufficient documentation

## 2019-02-20 DIAGNOSIS — M79605 Pain in left leg: Secondary | ICD-10-CM | POA: Diagnosis not present

## 2019-02-20 DIAGNOSIS — Z8673 Personal history of transient ischemic attack (TIA), and cerebral infarction without residual deficits: Secondary | ICD-10-CM | POA: Diagnosis not present

## 2019-02-20 DIAGNOSIS — Z7901 Long term (current) use of anticoagulants: Secondary | ICD-10-CM | POA: Diagnosis not present

## 2019-02-20 DIAGNOSIS — Z85828 Personal history of other malignant neoplasm of skin: Secondary | ICD-10-CM | POA: Diagnosis not present

## 2019-02-20 DIAGNOSIS — Z79899 Other long term (current) drug therapy: Secondary | ICD-10-CM | POA: Diagnosis not present

## 2019-02-20 DIAGNOSIS — M79662 Pain in left lower leg: Secondary | ICD-10-CM | POA: Diagnosis present

## 2019-02-20 DIAGNOSIS — I10 Essential (primary) hypertension: Secondary | ICD-10-CM | POA: Insufficient documentation

## 2019-02-20 NOTE — Discharge Instructions (Addendum)
Does not appear to be any cellulitis going on currently.  That one area that is tender in the mid shin and is slightly swollen looks bruised.  X-rays are negative.  Please return for any increasing swelling pain or redness or if you get a fever.  Try to keep your legs up when possible.  You can try walking for exercise.  When he gets cold you can try walking around in the mall.

## 2019-02-20 NOTE — ED Provider Notes (Signed)
[facility] Emergency Department Provider Note   ____________________________________________   First MD Initiated Contact with Patient 02/20/19 1324     (approximate)  I have reviewed the triage vital signs and the nursing notes.   HISTORY  Chief Complaint Cellulitis    HPI Claire [place] is a 85 y.o. female patient complains of pain to left lower leg.  She says she was diagnosed with cellulitis in September and treated with antibiotics.  She is finished the last one.  She says she still having pain in the leg but the pain when I examine her is localized around the swollen area in the midshaft that is about the size of fifty cents piece that slightly purpleish.  It looks most like a bruise.  She said she had an ultrasound of the leg done last week that was negative for DVT.  The leg skin is dry and flaky she had that happen after the redness improved with the antibiotics.  The legs are both slightly pink but when I elevate them the pinkness gets markedly better.  There is no swelling except for in the bruised area over the shin there is no tenderness on palpation anywhere except for in the bruised area over the shin there is no increased warmth.         Past Medical History:  Diagnosis Date  . Arthritis   . Basal cell carcinoma    basel cell  . Chronic cystitis   . Diabetes mellitus type 2, controlled, without complications ([place])    type 2 or unspecified type diabetes mellitus without mention of complication, not stated as uncontrolled  . Disease of thyroid gland   . Embolic stroke ([place])    a. 07/2016 MRI Brain: small right superior cerebellum and left lateral occipital cortex infarcts;  b. 08/2016 Echo: EF 60-65%, Gr1 DD, mild MR;  c. 08/2016 30 Day Event Monitor: No significant tachy/bradyarrhythmias. No afib.  . Follicular neoplasm of thyroid   . Hemorrhage of rectum and anus   . Hyperlipidemia   . Hyperlipidemia, unspecified   . Hypertension    . Leg pain   . Osteoarthritis   . Pernicious anemia   . Small vessel disease ([place])   . Spinal stenosis, lumbar region without neurogenic claudication   . Subdural hematoma ([place])    a. 2013 in setting of syncope.  . Syncope and collapse    a. 2013 - syncope and fall w/ resultant subdural hematoma; b. prev nl echo and stress test @ [facility].  Marland Kitchen Thyroid nodule   . Vertigo & Chronic Dizziness    a. Previously seen by ENT with Physical Therapy.  MRI 2000 small vessel disease    Patient Active Problem List   Diagnosis Date Noted  . Cellulitis 12/27/2018  . Carotid stenosis 12/28/2017  . Vertigo 11/30/2017  . Gait instability 10/19/2016  . Dizziness 11/03/2015  . Hypertension 12/04/2011  . Hyperlipidemia 12/04/2011  . Syncope 12/04/2011  . Diabetes mellitus ([place]) 12/04/2011  . Smoking hx 12/04/2011    Past Surgical History:  Procedure Laterality Date  . BILATERAL SALPINGOOPHORECTOMY    . COLONOSCOPY  01/26/2009   Diverticulosis  . CYSTOSCOPY    . HEMORRHOIDECTOMY BY SIMPLE LIGATION    . PR COLONOSCOPY FLX W/ENDOSCOPIC MUCOSAL RESECTION  09/02/2014   Procedure: COLONOSCOPY, FLEXIBLE; WITH ENDOSCOPIC MUCOSAL RESECTION; Surgeon: Saunders Revel, MD; [facility]; Service: Gastroenterology  . PR COLSC FLX W/RMVL OF TUMOR POLYP LESION SNARE TQ  09/02/2014   Procedure: COLONOSCOPY FLEX; W/REMOV TUMOR/LES BY SNARE; Surgeon: Saunders Revel, MD; [facility]: [facility]; Service: Gastroenterology  . SKIN SURGERY    . TOTAL ABDOMINAL HYSTERECTOMY W/ BILATERAL SALPINGOOPHORECTOMY  1970s    Prior to Admission medications   Medication Sig Start Date End Date Taking? Authorizing Provider  amLODipine (NORVASC) 5 MG tablet Take 1 tablet (5 mg total) by mouth 2 (two) times daily. 07/02/18 12/26/18  Minna Merritts, MD  amoxicillin-clavulanate (AUGMENTIN) 400-57 MG/5ML suspension Take 10.9 mLs (872 mg total) by mouth every 12 (twelve) hours. 12/30/18    Mayo, Pete Pelt, MD  atorvastatin (LIPITOR) 10 MG tablet TAKE 1 TABLET BY MOUTH EVERY DAY 04/11/18   Minna Merritts, MD  clopidogrel (PLAVIX) 75 MG tablet TAKE 1 TABLET (75 MG TOTAL) BY MOUTH DAILY. 07/25/18   Minna Merritts, MD  hydrALAZINE (APRESOLINE) 25 MG tablet Take 1 tablet (25 mg total) by mouth 3 (three) times daily as needed. As needed for systolic blood pressure [id number]. 06/13/18 09/11/18  Dunn, Areta Haber, PA-C  metoprolol tartrate (LOPRESSOR) 25 MG tablet Take 0.5 tablets (12.5 mg total) by mouth 2 (two) times daily. 10/17/17   Theora Gianotti, NP  Multiple Vitamins-Minerals (CENTRUM SILVER PO) Take 1 tablet by mouth daily. Reported on 06/11/2015    [provider]  vitamin B-12 (CYANOCOBALAMIN) 1000 MCG tablet Take by mouth.    [provider]  Vitamin D, Ergocalciferol, (DRISDOL) 1.25 MG (50000 UT) CAPS capsule Take 50,000 Units by mouth every 7 (seven) days.    [provider]    Allergies Hydrocortisone, Sulfa antibiotics, and Tape  Family History  Problem Relation Age of Onset  . Colon cancer Mother   . Heart disease Father   . Heart attack Father   . Prostate cancer Father   . Stroke Father   . Heart attack Brother   . Asthma Daughter   . Diabetes Other        Siblings  . Breast cancer Neg Hx     Social History Social History   Tobacco Use  . Smoking status: Former Smoker    Packs/day: 0.25    Years: 20.00    Pack years: 5.00    Types: Cigarettes    Quit date: 09/15/1993    Years since quitting: 25.4  . Smokeless tobacco: Never Used  Substance Use Topics  . Alcohol use: Yes    Alcohol/week: 2.0 standard drinks    Types: 1 Glasses of wine, 1 Shots of liquor per week    Comment: occassional  . Drug use: No    Review of Systems  Constitutional: No fever/chills Eyes: No visual changes. ENT: No sore throat. Cardiovascular: Denies chest pain. Respiratory: Denies shortness of breath. Gastrointestinal: No abdominal pain.   No nausea, no vomiting.  No diarrhea.  No constipation. Genitourinary: Negative for dysuria. Musculoskeletal: Negative for back pain. Skin: Negative for rash except bruised area as described in HPI. Neurological: Negative for headaches, focal weakness ____________________________________________   PHYSICAL EXAM:  VITAL SIGNS: ED Triage Vitals  Enc Vitals Group     BP 02/20/19 1226 (!) 152/48     Pulse Rate 02/20/19 1226 74     Resp 02/20/19 1226 16     Temp 02/20/19 1226 98.6 F (37 C)     Temp Source 02/20/19 1226 Oral     SpO2 02/20/19 1226 97 %     Weight 02/20/19 1214 117 lb (53.1 kg)  Height 02/20/19 1214 5\' 1"  (1.549 m)     Head Circumference --      Peak Flow --      Pain Score 02/20/19 1214 0     Pain Loc --      Pain Edu? --      Excl. in [place]? --     Constitutional: Alert and oriented. Well appearing and in no acute distress. Eyes: Conjunctivae are normal.  Head: Atraumatic. Nose: No congestion/rhinnorhea. Mouth/Throat: Mucous membranes are moist.  Neck: No stridor. Cardiovascular: Good peripheral circulation Respiratory: Normal respiratory effort.  No retractions. Musculoskeletal: No lower extremity tenderness nor edema except for as noted in HPI the bruised area on the left shin.   Neurologic:  Normal speech and language. No gross focal neurologic deficits are appreciated.  Skin:  Skin is warm, dry and intact. No rash noted.  Again see HPI Psychiatric: Mood and affect are normal. Speech and behavior are normal.  ____________________________________________   LABS (all labs ordered are listed, but only abnormal results are displayed)  Labs Reviewed - No data to display ____________________________________________  EKG   ____________________________________________  RADIOLOGY  ED MD interpretation: [place] reviewed by me is negative  Official radiology report(s): No results found.  ____________________________________________   PROCEDURES   Procedure(s) performed (including Critical Care):  Procedures   ____________________________________________   INITIAL IMPRESSION / ASSESSMENT AND PLAN / ED COURSE  Patient appears to have a bruise on the shin.  She is concerned that something is going on with the bone so we x-rayed it of course x-rays negative.  I will let her go home use Tylenol as needed for the pain keep her legs up if she is sitting and try to increase her walking somewhat.  I will diagnosed her with left anterior leg pain.  I believe this again is caused by the bruise.              ____________________________________________   FINAL CLINICAL IMPRESSION(S) / ED DIAGNOSES  Final diagnoses:  Leg pain, anterior, left     ED Discharge Orders    None       Note:  This document was prepared using Dragon voice recognition software and may include unintentional dictation errors.    Nena Polio, MD 02/20/19 1536

## 2019-02-20 NOTE — ED Notes (Signed)
ED Provider at bedside. 

## 2019-02-20 NOTE — ED Triage Notes (Signed)
Pt to ED from home c/o pain to left lower leg.  States dx with cellulitis Sept of that leg and treated with ABX at home and just finished final one.  States leg has improved but still having intermittent pain and recommended by PCP to come to ED.  Leg appears dry, flakey skin, no obvious redness.

## 2019-02-20 NOTE — ED Notes (Signed)
No protocols at this time per Dr. Corky Downs.

## 2019-02-28 ENCOUNTER — Ambulatory Visit (INDEPENDENT_AMBULATORY_CARE_PROVIDER_SITE_OTHER): Payer: Medicare Other | Admitting: Vascular Surgery

## 2019-02-28 ENCOUNTER — Other Ambulatory Visit: Payer: Self-pay

## 2019-02-28 ENCOUNTER — Encounter (INDEPENDENT_AMBULATORY_CARE_PROVIDER_SITE_OTHER): Payer: Self-pay | Admitting: Vascular Surgery

## 2019-02-28 VITALS — BP 129/65 | HR 62 | Resp 16 | Ht 61.0 in | Wt 120.0 lb

## 2019-02-28 DIAGNOSIS — I6523 Occlusion and stenosis of bilateral carotid arteries: Secondary | ICD-10-CM | POA: Diagnosis not present

## 2019-02-28 DIAGNOSIS — M7989 Other specified soft tissue disorders: Secondary | ICD-10-CM | POA: Insufficient documentation

## 2019-02-28 DIAGNOSIS — L03116 Cellulitis of left lower limb: Secondary | ICD-10-CM | POA: Diagnosis not present

## 2019-02-28 DIAGNOSIS — R42 Dizziness and giddiness: Secondary | ICD-10-CM

## 2019-02-28 DIAGNOSIS — I10 Essential (primary) hypertension: Secondary | ICD-10-CM | POA: Diagnosis not present

## 2019-02-28 DIAGNOSIS — E782 Mixed hyperlipidemia: Secondary | ICD-10-CM

## 2019-02-28 NOTE — Assessment & Plan Note (Signed)
Now improved but concern for perfusion issues certainly persists.  ABIs and venous reflux study will be done in the near future at her convenience.

## 2019-02-28 NOTE — Assessment & Plan Note (Signed)
Has been mild and we have decided not to check it any further given her age

## 2019-02-28 NOTE — Assessment & Plan Note (Signed)
blood pressure control important in reducing the progression of atherosclerotic disease. On appropriate oral medications.  

## 2019-02-28 NOTE — Assessment & Plan Note (Signed)
May have developed a component of lymphedema with cellulitis and trauma to the leg.  Could also have venous disease and a venous reflux study will be done in the near future at her convenience.  We will see her back following the studies.

## 2019-02-28 NOTE — Progress Notes (Signed)
MRN : [id number]  Claire Washington is a 85 y.o. (18-Sep-1924) female who presents with chief complaint of  Chief Complaint  Patient presents with   New Patient (Initial Visit)    consult PVD  .  History of Present Illness: Patient returns today in follow up on referral from her primary care physician for swelling and cellulitis of the left leg.  She had a minor trauma with a very slow to heal ulceration on that leg but it has finally healed.  Swelling developed about 4 months ago with no clear cause or inciting event.  Compression stockings did not help the swelling.  She says all they did was make her thighs swollen and painful.  She says she has tried to elevate her legs and the swelling has gone down slowly over time.  She remains active despite her advanced age.  The left leg is the more severely affected of the 2 legs.  No antecedent history of DVT or superficial thrombophlebitis to her knowledge.  Current Outpatient Medications  Medication Sig Dispense Refill   atorvastatin (LIPITOR) 10 MG tablet TAKE 1 TABLET BY MOUTH EVERY DAY 90 tablet 4   clopidogrel (PLAVIX) 75 MG tablet TAKE 1 TABLET (75 MG TOTAL) BY MOUTH DAILY. 90 tablet 3   metoprolol tartrate (LOPRESSOR) 25 MG tablet Take 0.5 tablets (12.5 mg total) by mouth 2 (two) times daily. 30 tablet 6   Multiple Vitamins-Minerals (CENTRUM SILVER PO) Take 1 tablet by mouth daily. Reported on 06/11/2015     vitamin B-12 (CYANOCOBALAMIN) 1000 MCG tablet Take by mouth.     Vitamin D, Ergocalciferol, (DRISDOL) 1.25 MG (50000 UT) CAPS capsule Take 50,000 Units by mouth every 7 (seven) days.     amLODipine (NORVASC) 5 MG tablet Take 1 tablet (5 mg total) by mouth 2 (two) times daily. 180 tablet 3   amoxicillin-clavulanate (AUGMENTIN) 400-57 MG/5ML suspension Take 10.9 mLs (872 mg total) by mouth every 12 (twelve) hours. (Patient not taking: Reported on 02/28/2019) 100 mL 0   hydrALAZINE (APRESOLINE) 25 MG tablet Take 1 tablet (25 mg  total) by mouth 3 (three) times daily as needed. As needed for systolic blood pressure [id number]. 120 tablet 3   No current facility-administered medications for this visit.     Past Medical History:  Diagnosis Date   Arthritis    Basal cell carcinoma    basel cell   Chronic cystitis    Diabetes mellitus type 2, controlled, without complications ([place])    type 2 or unspecified type diabetes mellitus without mention of complication, not stated as uncontrolled   Disease of thyroid gland    Embolic stroke ([place])    a. 07/2016 MRI Brain: small right superior cerebellum and left lateral occipital cortex infarcts;  b. 08/2016 Echo: EF 60-65%, Gr1 DD, mild MR;  c. 08/2016 30 Day Event Monitor: No significant tachy/bradyarrhythmias. No afib.   Follicular neoplasm of thyroid    Hemorrhage of rectum and anus    Hyperlipidemia    Hyperlipidemia, unspecified    Hypertension    Leg pain    Osteoarthritis    Pernicious anemia    Small vessel disease ([place])    Spinal stenosis, lumbar region without neurogenic claudication    Subdural hematoma ([place])    a. 2013 in setting of syncope.   Syncope and collapse    a. 2013 - syncope and fall w/ resultant subdural hematoma; b. prev nl echo and stress test @ [facility].   Thyroid  nodule    Vertigo & Chronic Dizziness    a. Previously seen by ENT with Physical Therapy.  MRI 2000 small vessel disease    Past Surgical History:  Procedure Laterality Date   BILATERAL SALPINGOOPHORECTOMY     COLONOSCOPY  01/26/2009   Diverticulosis   CYSTOSCOPY     HEMORRHOIDECTOMY BY SIMPLE LIGATION     PR COLONOSCOPY FLX W/ENDOSCOPIC MUCOSAL RESECTION  09/02/2014   Procedure: COLONOSCOPY, FLEXIBLE; WITH ENDOSCOPIC MUCOSAL RESECTION; Surgeon: Saunders Revel, MD; [facility]; Service: Gastroenterology   PR [place] FLX W/RMVL OF TUMOR POLYP LESION [place] TQ  09/02/2014   Procedure: COLONOSCOPY FLEX; W/REMOV TUMOR/LES BY SNARE;  Surgeon: Saunders Revel, MD; [facility]; Service: Gastroenterology   SKIN SURGERY     TOTAL ABDOMINAL HYSTERECTOMY W/ BILATERAL SALPINGOOPHORECTOMY  1970s    Social History        Tobacco Use   Smoking status: Former Smoker    Packs/day: 0.25    Years: 20.00    Pack years: 5.00    Types: Cigarettes    Last attempt to quit: 09/15/1993    Years since quitting: 24.3   Smokeless tobacco: Never Used  Substance Use Topics   Alcohol use: Yes    Alcohol/week: 2.0 standard drinks    Types: 1 Glasses of wine, 1 Shots of liquor per week    Comment: occassional   Drug use: No          Family History  Problem Relation Age of Onset   Colon cancer Mother    Heart disease Father    Heart attack Father    Prostate cancer Father    Stroke Father    Heart attack Brother    Asthma Daughter    Diabetes Other        Siblings          Allergies  Allergen Reactions   Hydrocortisone Swelling   Sulfa Antibiotics Other (See Comments)    Reaction: unknown   Tape Dermatitis     REVIEW OF SYSTEMS (Negative unless checked)  Constitutional: [] ?Weight loss  [] ?Fever  [] ?Chills Cardiac: [] ?Chest pain   [] ?Chest pressure   [] ?Palpitations   [] ?Shortness of breath when laying flat   [] ?Shortness of breath at rest   [] ?Shortness of breath with exertion. Vascular:  [] ?Pain in legs with walking   [] ?Pain in legs at rest   [] ?Pain in legs when laying flat   [] ?Claudication   [] ?Pain in feet when walking  [] ?Pain in feet at rest  [] ?Pain in feet when laying flat   [] ?History of DVT   [] ?Phlebitis   [] ?Swelling in legs   [] ?Varicose veins   [] ?Non-healing ulcers Pulmonary:   [] ?Uses home oxygen   [] ?Productive cough   [] ?Hemoptysis   [] ?Wheeze  [] ?COPD   [] ?Asthma Neurologic:  [x] ?Dizziness  [] ?Blackouts   [] ?Seizures   [] ?History of stroke   [] ?History of TIA  [] ?Aphasia   [] ?Temporary blindness   [] ?Dysphagia    [] ?Weakness or numbness in arms   [] ?Weakness or numbness in legs Musculoskeletal:  [x] ?Arthritis   [] ?Joint swelling   [] ?Joint pain   [x] ?Low back pain Hematologic:  [] ?Easy bruising  [] ?Easy bleeding   [] ?Hypercoagulable state   [x] ?Anemic  [] ?Hepatitis Gastrointestinal:  [] ?Blood in stool   [] ?Vomiting blood  [] ?Gastroesophageal reflux/heartburn   [] ?Difficulty swallowing. Genitourinary:  [] ?Chronic kidney disease   [] ?Difficult urination  [] ?Frequent urination  [] ?Burning with urination   [] ?Blood in urine Skin:  [] ?Rashes   [] ?  Ulcers   [] ?Wounds Psychological:  [] ?History of anxiety   [] ? History of major depression.    Physical Examination  BP 129/65 (BP Location: Left Arm)    Pulse 62    Resp 16    Ht 5\' 1"  (1.549 m)    Wt 120 lb (54.4 kg)    BMI 22.67 kg/m  Gen:  WD/WN, NAD.  Appears younger than stated age Head: [place]/AT, No temporalis wasting. Ear/Nose/Throat: Hearing grossly intact, nares w/o erythema or drainage Eyes: Conjunctiva clear. Sclera non-icteric Neck: Supple.  Trachea midline Pulmonary:  Good air movement, no use of accessory muscles.  Cardiac: Somewhat irregular Vascular:  Vessel Right Left  Radial Palpable Palpable                          PT  1+ palpable  trace palpable  DP  2+ palpable  2+ palpable   Gastrointestinal: soft, non-tender/non-distended.  Musculoskeletal: M/S 5/5 throughout.  No deformity or atrophy.  Walking with a rolling walker.  Mild bilateral lower extremity edema a little worse on the left than the right. Neurologic: Sensation grossly intact in extremities.  Symmetrical.  Speech is fluent.  Psychiatric: Judgment intact, Mood & affect appropriate for pt's clinical situation. Dermatologic: No rashes or ulcers noted.  Previous wound on the left anterior shin has now healed       Labs Recent Results (from the past 2160 hour(s))  Lactic acid, plasma     Status: Abnormal   Collection Time: 12/26/18  3:56 PM  Result Value Ref Range     Lactic Acid, Venous 2.6 (HH) 0.5 - 1.9 mmol/L    Comment: CRITICAL RESULT CALLED TO, READ BACK BY AND VERIFIED WITH STEPHANIE RUDD 12/26/18 1630 KLW Performed at [facility], [address] [place], [place] [address]   Comprehensive metabolic panel     Status: Abnormal   Collection Time: 12/26/18  3:56 PM  Result Value Ref Range   Sodium 139 135 - 145 mmol/L   Potassium 3.7 3.5 - 5.1 mmol/L   Chloride 105 98 - 111 mmol/L   CO2 24 22 - 32 mmol/L   Glucose, Bld 166 (H) 70 - 99 mg/dL   BUN 14 8 - 23 mg/dL   Creatinine, Ser 0.69 0.44 - 1.00 mg/dL   Calcium 9.5 8.9 - 10.3 mg/dL   Total Protein 7.2 6.5 - 8.1 g/dL   Albumin 4.5 3.5 - 5.0 g/dL   AST 19 15 - 41 U/L   ALT 17 0 - 44 U/L   Alkaline Phosphatase 65 38 - 126 U/L   Total Bilirubin 0.7 0.3 - 1.2 mg/dL   GFR calc non Af Amer >60 >60 mL/min   GFR calc Af Amer >60 >60 mL/min   Anion gap 10 5 - 15    Comment: Performed at [facility], [address] [place], [place] [address]  CBC with Differential     Status: Abnormal   Collection Time: 12/26/18  3:56 PM  Result Value Ref Range   WBC 7.8 4.0 - 10.5 K/uL   RBC 5.33 (H) 3.87 - 5.11 MIL/uL   Hemoglobin 15.6 (H) 12.0 - 15.0 g/dL   HCT 45.9 36.0 - 46.0 %   MCV 86.1 80.0 - 100.0 fL   MCH 29.3 26.0 - 34.0 pg   MCHC 34.0 30.0 - 36.0 g/dL   RDW 13.2 11.5 - 15.5 %   Platelets 232 150 - 400 K/uL  nRBC 0.0 0.0 - 0.2 %   Neutrophils Relative % 64 %   Neutro Abs 5.0 1.7 - 7.7 K/uL   Lymphocytes Relative 26 %   Lymphs Abs 2.0 0.7 - 4.0 K/uL   Monocytes Relative 7 %   Monocytes Absolute 0.6 0.1 - 1.0 K/uL   Eosinophils Relative 2 %   Eosinophils Absolute 0.2 0.0 - 0.5 K/uL   Basophils Relative 1 %   Basophils Absolute 0.1 0.0 - 0.1 K/uL   Immature Granulocytes 0 %   Abs Immature Granulocytes 0.03 0.00 - 0.07 K/uL    Comment: Performed at [facility], [address] [place], [place] [address]  TSH     Status: None   Collection Time: 12/26/18   3:56 PM  Result Value Ref Range   TSH 4.292 0.350 - 4.500 uIU/mL    Comment: Performed by a 3rd Generation assay with a functional sensitivity of <=0.01 uIU/mL. Performed at [facility], [address] [place], [place] [address]   Lactic acid, plasma     Status: Abnormal   Collection Time: 12/26/18  8:54 PM  Result Value Ref Range   Lactic Acid, Venous 2.1 (HH) 0.5 - 1.9 mmol/L    Comment: CRITICAL RESULT CALLED TO, READ BACK BY AND VERIFIED WITH Liana Crocker RN AT 2125 ON 12/26/2018 SNG Performed at [facility], [address] [place], [place] [address]   Urinalysis, Complete w Microscopic     Status: Abnormal   Collection Time: 12/26/18  8:54 PM  Result Value Ref Range   Color, Urine STRAW (A) YELLOW   APPearance CLEAR (A) CLEAR   Specific Gravity, Urine 1.006 1.005 - 1.030   pH 5.0 5.0 - 8.0   Glucose, UA NEGATIVE NEGATIVE mg/dL   Hgb urine dipstick NEGATIVE NEGATIVE   Bilirubin Urine NEGATIVE NEGATIVE   Ketones, ur NEGATIVE NEGATIVE mg/dL   Protein, ur NEGATIVE NEGATIVE mg/dL   Nitrite NEGATIVE NEGATIVE   Leukocytes,Ua NEGATIVE NEGATIVE   WBC, UA 0-5 0 - 5 WBC/hpf   Bacteria, UA NONE SEEN NONE SEEN   Squamous Epithelial / LPF 0-5 0 - 5    Comment: Performed at [name], [address] [place], [place] [address]  Blood culture (routine x 2)     Status: None   Collection Time: 12/26/18  8:55 PM   Specimen: BLOOD  Result Value Ref Range   Specimen Description BLOOD RIGHT ANTECUBITAL    Special Requests      BOTTLES DRAWN AEROBIC AND ANAEROBIC Blood Culture adequate volume   Culture      NO GROWTH 5 DAYS Performed at [facility], [address], [place], [place] [address]    Report Status 12/31/2018 FINAL   SARS Coronavirus 2 [facility] order, Performed in [facility] hospital lab) Nasopharyngeal Nasopharyngeal Swab     Status: None   Collection Time: 12/26/18  9:03 PM   Specimen: Nasopharyngeal Swab  Result Value Ref  Range   SARS Coronavirus 2 NEGATIVE NEGATIVE    Comment: (NOTE) If result is NEGATIVE SARS-CoV-2 target nucleic acids are NOT DETECTED. The SARS-CoV-2 RNA is generally detectable in upper and lower  respiratory specimens during the acute phase of infection. The lowest  concentration of SARS-CoV-2 viral copies this assay can detect is 250  copies / mL. A negative result does not preclude SARS-CoV-2 infection  and should not be used as the sole basis for treatment or other  patient management decisions.  A negative result  may occur with  improper specimen collection / handling, submission of specimen other  than nasopharyngeal swab, presence of viral mutation(s) within the  areas targeted by this assay, and inadequate number of viral copies  (<250 copies / mL). A negative result must be combined with clinical  observations, patient history, and epidemiological information. If result is POSITIVE SARS-CoV-2 target nucleic acids are DETECTED. The SARS-CoV-2 RNA is generally detectable in upper and lower  respiratory specimens dur ing the acute phase of infection.  Positive  results are indicative of active infection with SARS-CoV-2.  Clinical  correlation with patient history and other diagnostic information is  necessary to determine patient infection status.  Positive results do  not rule out bacterial infection or co-infection with other viruses. If result is PRESUMPTIVE POSTIVE SARS-CoV-2 nucleic acids MAY BE PRESENT.   A presumptive positive result was obtained on the submitted specimen  and confirmed on repeat testing.  While 2019 novel coronavirus  (SARS-CoV-2) nucleic acids may be present in the submitted sample  additional confirmatory testing may be necessary for epidemiological  and / or clinical management purposes  to differentiate between  SARS-CoV-2 and other Sarbecovirus currently known to infect humans.  If clinically indicated additional testing with an alternate test    methodology [phone]) is advised. The SARS-CoV-2 RNA is generally  detectable in upper and lower respiratory sp ecimens during the acute  phase of infection. The expected result is Negative. Fact Sheet for Patients:  StrictlyIdeas.no Fact Sheet for Healthcare Providers: BankingDealers.co.za This test is not yet approved or cleared by the [place] FDA and has been authorized for detection and/or diagnosis of SARS-CoV-2 by FDA under an Emergency Use Authorization (EUA).  This EUA will remain in effect (meaning this test can be used) for the duration of the COVID-19 declaration under Section 564(b)(1) of the Act, 21 U.S.C. section 360bbb-3(b)(1), unless the authorization is terminated or revoked sooner. Performed at [facility], [address] [place], [place] [address]   Blood culture (routine x 2)     Status: None   Collection Time: 12/26/18 11:33 PM   Specimen: BLOOD  Result Value Ref Range   Specimen Description BLOOD RIGHT ANTECUBITAL    Special Requests      BOTTLES DRAWN AEROBIC AND ANAEROBIC Blood Culture results may not be optimal due to an excessive volume of blood received in culture bottles   Culture      NO GROWTH 5 DAYS Performed at [facility], [address] [place], [place] [address]    Report Status 12/31/2018 FINAL   CBC     Status: None   Collection Time: 12/28/18  5:26 AM  Result Value Ref Range   WBC 5.6 4.0 - 10.5 K/uL   RBC 4.30 3.87 - 5.11 MIL/uL   Hemoglobin 12.6 12.0 - 15.0 g/dL   HCT 37.5 36.0 - 46.0 %   MCV 87.2 80.0 - 100.0 fL   MCH 29.3 26.0 - 34.0 pg   MCHC 33.6 30.0 - 36.0 g/dL   RDW 13.5 11.5 - 15.5 %   Platelets 178 150 - 400 K/uL   nRBC 0.0 0.0 - 0.2 %    Comment: Performed at [facility], [address] [place], [place] [id number]  Basic metabolic panel     Status: Abnormal   Collection Time: 12/28/18  5:26 AM  Result Value Ref Range   Sodium 143 135  - 145 mmol/L   Potassium 3.3 (L) 3.5 - 5.1 mmol/L  Chloride 111 98 - 111 mmol/L   CO2 26 22 - 32 mmol/L   Glucose, Bld 105 (H) 70 - 99 mg/dL   BUN 10 8 - 23 mg/dL   Creatinine, Ser 0.54 0.44 - 1.00 mg/dL   Calcium 8.2 (L) 8.9 - 10.3 mg/dL   GFR calc non Af Amer >60 >60 mL/min   GFR calc Af Amer >60 >60 mL/min   Anion gap 6 5 - 15    Comment: Performed at [facility], [address] [place], [place] [address]  Lactic acid, plasma     Status: None   Collection Time: 12/28/18  7:53 AM  Result Value Ref Range   Lactic Acid, Venous 0.7 0.5 - 1.9 mmol/L    Comment: Performed at [facility], [address] [place], [place] [id number]  Basic metabolic panel     Status: Abnormal   Collection Time: 12/29/18  6:06 AM  Result Value Ref Range   Sodium 142 135 - 145 mmol/L   Potassium 3.3 (L) 3.5 - 5.1 mmol/L   Chloride 109 98 - 111 mmol/L   CO2 25 22 - 32 mmol/L   Glucose, Bld 101 (H) 70 - 99 mg/dL   BUN 11 8 - 23 mg/dL   Creatinine, Ser 0.56 0.44 - 1.00 mg/dL   Calcium 8.1 (L) 8.9 - 10.3 mg/dL   GFR calc non Af Amer >60 >60 mL/min   GFR calc Af Amer >60 >60 mL/min   Anion gap 8 5 - 15    Comment: Performed at [facility], [address] [place], [place] [address]  Magnesium     Status: None   Collection Time: 12/29/18  6:06 AM  Result Value Ref Range   Magnesium 1.9 1.7 - 2.4 mg/dL    Comment: Performed at [facility], [address] [place], [place] [address]  SARS Coronavirus 2 [facility] order, Performed in [facility] hospital lab) Nasopharyngeal Nasopharyngeal Swab     Status: None   Collection Time: 12/29/18  1:50 PM   Specimen: Nasopharyngeal Swab  Result Value Ref Range   SARS Coronavirus 2 NEGATIVE NEGATIVE    Comment: (NOTE) If result is NEGATIVE SARS-CoV-2 target nucleic acids are NOT DETECTED. The SARS-CoV-2 RNA is generally detectable in upper and lower  respiratory specimens during the acute phase of infection. The lowest    concentration of SARS-CoV-2 viral copies this assay can detect is 250  copies / mL. A negative result does not preclude SARS-CoV-2 infection  and should not be used as the sole basis for treatment or other  patient management decisions.  A negative result may occur with  improper specimen collection / handling, submission of specimen other  than nasopharyngeal swab, presence of viral mutation(s) within the  areas targeted by this assay, and inadequate number of viral copies  (<250 copies / mL). A negative result must be combined with clinical  observations, patient history, and epidemiological information. If result is POSITIVE SARS-CoV-2 target nucleic acids are DETECTED. The SARS-CoV-2 RNA is generally detectable in upper and lower  respiratory specimens dur ing the acute phase of infection.  Positive  results are indicative of active infection with SARS-CoV-2.  Clinical  correlation with patient history and other diagnostic information is  necessary to determine patient infection status.  Positive results do  not rule out bacterial infection or co-infection with other viruses. If result is PRESUMPTIVE POSTIVE SARS-CoV-2 nucleic acids MAY BE PRESENT.   A presumptive positive result  was obtained on the submitted specimen  and confirmed on repeat testing.  While 2019 novel coronavirus  (SARS-CoV-2) nucleic acids may be present in the submitted sample  additional confirmatory testing may be necessary for epidemiological  and / or clinical management purposes  to differentiate between  SARS-CoV-2 and other Sarbecovirus currently known to infect humans.  If clinically indicated additional testing with an alternate test  methodology [phone]) is advised. The SARS-CoV-2 RNA is generally  detectable in upper and lower respiratory sp ecimens during the acute  phase of infection. The expected result is Negative. Fact Sheet for Patients:  StrictlyIdeas.no Fact Sheet  for Healthcare Providers: BankingDealers.co.za This test is not yet approved or cleared by the [place] FDA and has been authorized for detection and/or diagnosis of SARS-CoV-2 by FDA under an Emergency Use Authorization (EUA).  This EUA will remain in effect (meaning this test can be used) for the duration of the COVID-19 declaration under Section 564(b)(1) of the Act, 21 U.S.C. section 360bbb-3(b)(1), unless the authorization is terminated or revoked sooner. Performed at [facility], [address] [place], [place] [id number]   Basic metabolic panel     Status: Abnormal   Collection Time: 12/30/18  3:58 AM  Result Value Ref Range   Sodium 140 135 - 145 mmol/L   Potassium 3.8 3.5 - 5.1 mmol/L   Chloride 109 98 - 111 mmol/L   CO2 27 22 - 32 mmol/L   Glucose, Bld 101 (H) 70 - 99 mg/dL   BUN 15 8 - 23 mg/dL   Creatinine, Ser 0.53 0.44 - 1.00 mg/dL   Calcium 8.3 (L) 8.9 - 10.3 mg/dL   GFR calc non Af Amer >60 >60 mL/min   GFR calc Af Amer >60 >60 mL/min   Anion gap 4 (L) 5 - 15    Comment: Performed at [facility], [address], [place], [place] [address]  Magnesium     Status: None   Collection Time: 12/30/18  3:58 AM  Result Value Ref Range   Magnesium 2.2 1.7 - 2.4 mg/dL    Comment: Performed at [facility], [address] [place], [place] [address]    Radiology Dg Tibia/fibula Left  Result Date: 02/20/2019 CLINICAL DATA:  Left lower leg pain. EXAM: LEFT TIBIA AND FIBULA - 2 VIEW COMPARISON:  Left tibia fibula x-rays dated December 26, 2018. FINDINGS: Lower leg soft tissue swelling is mildly improved when compared to the prior study. No acute fracture or dislocation. No focal bone lesion. IMPRESSION: 1. Improving soft tissue swelling.  No acute osseous abnormality. Electronically Signed   By: Titus Dubin M.D.   On: 02/20/2019 15:35   US Venous Img Lower Bilateral  Result Date: 02/07/2019 CLINICAL DATA:   Bilateral lower extremity edema. EXAM: BILATERAL LOWER EXTREMITY VENOUS DOPPLER ULTRASOUND TECHNIQUE: Gray-scale sonography with graded compression, as well as color Doppler and duplex ultrasound were performed to evaluate the lower extremity deep venous systems from the level of the common femoral vein and including the common femoral, femoral, profunda femoral, popliteal and calf veins including the posterior tibial, peroneal and gastrocnemius veins when visible. The superficial great saphenous vein was also interrogated. Spectral Doppler was utilized to evaluate flow at rest and with distal augmentation maneuvers in the common femoral, femoral and popliteal veins. COMPARISON:  September 16, 2015. FINDINGS: RIGHT LOWER EXTREMITY Common Femoral Vein: No evidence of thrombus. Normal compressibility, respiratory phasicity and response to augmentation. Saphenofemoral Junction: No evidence of thrombus. Normal  compressibility and flow on color Doppler imaging. Profunda Femoral Vein: No evidence of thrombus. Normal compressibility and flow on color Doppler imaging. Femoral Vein: No evidence of thrombus. Normal compressibility, respiratory phasicity and response to augmentation. Popliteal Vein: No evidence of thrombus. Normal compressibility, respiratory phasicity and response to augmentation. Calf Veins: No evidence of thrombus. Normal compressibility and flow on color Doppler imaging. Venous Reflux:  None. Other Findings:  None. LEFT LOWER EXTREMITY Common Femoral Vein: No evidence of thrombus. Normal compressibility, respiratory phasicity and response to augmentation. Saphenofemoral Junction: No evidence of thrombus. Normal compressibility and flow on color Doppler imaging. Profunda Femoral Vein: No evidence of thrombus. Normal compressibility and flow on color Doppler imaging. Femoral Vein: No evidence of thrombus. Normal compressibility, respiratory phasicity and response to augmentation. Popliteal Vein: No evidence of  thrombus. Normal compressibility, respiratory phasicity and response to augmentation. Calf Veins: No evidence of thrombus. Normal compressibility and flow on color Doppler imaging. Venous Reflux:  None. Other Findings:  None. IMPRESSION: No evidence of deep venous thrombosis in either lower extremity. Electronically Signed   By: Marijo Conception M.D.   On: 02/07/2019 16:20    Assessment/Plan Hyperlipidemia lipid control important in reducing the progression of atherosclerotic disease. Continue statin therapy   Vertigo Not from carotid disease.  Follows with neurology.  Hypertension blood pressure control important in reducing the progression of atherosclerotic disease. On appropriate oral medications.   Carotid stenosis Has been mild and we have decided not to check it any further given her age  Cellulitis Now improved but concern for perfusion issues certainly persists.  ABIs and venous reflux study will be done in the near future at her convenience.  Swelling of limb May have developed a component of lymphedema with cellulitis and trauma to the leg.  Could also have venous disease and a venous reflux study will be done in the near future at her convenience.  We will see her back following the studies.    Leotis Pain, MD  02/28/2019 9:27 AM    This note was created with Dragon medical transcription system.  Any errors from dictation are purely unintentional

## 2019-03-05 ENCOUNTER — Ambulatory Visit (INDEPENDENT_AMBULATORY_CARE_PROVIDER_SITE_OTHER): Payer: Medicare Other

## 2019-03-05 ENCOUNTER — Ambulatory Visit (INDEPENDENT_AMBULATORY_CARE_PROVIDER_SITE_OTHER): Payer: Medicare Other | Admitting: Nurse Practitioner

## 2019-03-05 ENCOUNTER — Other Ambulatory Visit: Payer: Self-pay

## 2019-03-05 ENCOUNTER — Encounter (INDEPENDENT_AMBULATORY_CARE_PROVIDER_SITE_OTHER): Payer: Self-pay | Admitting: Nurse Practitioner

## 2019-03-05 VITALS — BP 153/73 | HR 66 | Resp 15 | Wt 121.8 lb

## 2019-03-05 DIAGNOSIS — M7989 Other specified soft tissue disorders: Secondary | ICD-10-CM

## 2019-03-05 DIAGNOSIS — L03116 Cellulitis of left lower limb: Secondary | ICD-10-CM

## 2019-03-05 DIAGNOSIS — E782 Mixed hyperlipidemia: Secondary | ICD-10-CM | POA: Diagnosis not present

## 2019-03-05 DIAGNOSIS — I10 Essential (primary) hypertension: Secondary | ICD-10-CM

## 2019-03-05 DIAGNOSIS — I890 Lymphedema, not elsewhere classified: Secondary | ICD-10-CM | POA: Diagnosis not present

## 2019-03-09 ENCOUNTER — Encounter (INDEPENDENT_AMBULATORY_CARE_PROVIDER_SITE_OTHER): Payer: Self-pay | Admitting: Nurse Practitioner

## 2019-03-09 DIAGNOSIS — I890 Lymphedema, not elsewhere classified: Secondary | ICD-10-CM | POA: Insufficient documentation

## 2019-03-09 NOTE — Progress Notes (Signed)
SUBJECTIVE:  Patient ID: [facility], female    DOB: 1924/11/26, 85 y.o.   MRN: [id number] Chief Complaint  Patient presents with  . Follow-up    ultrasound follow up    HPI  Claire [facility] is a 85 y.o. female The patient returns to the office for followup evaluation regarding leg swelling.  The swelling has persisted and the pain associated with swelling continues. There have not been any interval development of a ulcerations or wounds.  Since the previous visit the patient has been wearing graduated compression stockings for at least 4 weeks and has noted little if any improvement in the lymphedema. The patient has been using compression routinely morning until night.  The patient also states elevation during the day and exercise is being done too.  Today the patient underwent bilateral ABIs.  The right ABI is 1.16 and the left ABI is 1.17.  The patient has triphasic tibial artery waveforms with good toe waveforms bilaterally.    The patient had no evidence of DVT or superficial venous thrombosis bilaterally.  There was no evidence of chronic venous insufficiency bilaterally.  Past Medical History:  Diagnosis Date  . Arthritis   . Basal cell carcinoma    basel cell  . Chronic cystitis   . Diabetes mellitus type 2, controlled, without complications ([place])    type 2 or unspecified type diabetes mellitus without mention of complication, not stated as uncontrolled  . Disease of thyroid gland   . Embolic stroke ([place])    a. 07/2016 MRI Brain: small right superior cerebellum and left lateral occipital cortex infarcts;  b. 08/2016 Echo: EF 60-65%, Gr1 DD, mild MR;  c. 08/2016 30 Day Event Monitor: No significant tachy/bradyarrhythmias. No afib.  . Follicular neoplasm of thyroid   . Hemorrhage of rectum and anus   . Hyperlipidemia   . Hyperlipidemia, unspecified   . Hypertension   . Leg pain   . Osteoarthritis   . Pernicious anemia   . Small vessel disease ([place])   .  Spinal stenosis, lumbar region without neurogenic claudication   . Subdural hematoma ([place])    a. 2013 in setting of syncope.  . Syncope and collapse    a. 2013 - syncope and fall w/ resultant subdural hematoma; b. prev nl echo and stress test @ [facility].  Marland Kitchen Thyroid nodule   . Vertigo & Chronic Dizziness    a. Previously seen by ENT with Physical Therapy.  MRI 2000 small vessel disease    Past Surgical History:  Procedure Laterality Date  . BILATERAL SALPINGOOPHORECTOMY    . COLONOSCOPY  01/26/2009   Diverticulosis  . CYSTOSCOPY    . HEMORRHOIDECTOMY BY SIMPLE LIGATION    . PR COLONOSCOPY FLX W/ENDOSCOPIC MUCOSAL RESECTION  09/02/2014   Procedure: COLONOSCOPY, FLEXIBLE; WITH ENDOSCOPIC MUCOSAL RESECTION; Surgeon: Saunders Revel, MD; [facility]: [facility]; Service: Gastroenterology  . PR COLSC FLX W/RMVL OF TUMOR POLYP LESION SNARE TQ  09/02/2014   Procedure: COLONOSCOPY FLEX; W/REMOV TUMOR/LES BY SNARE; Surgeon: Saunders Revel, MD; Location: [facility]; Service: Gastroenterology  . SKIN SURGERY    . TOTAL ABDOMINAL HYSTERECTOMY W/ BILATERAL SALPINGOOPHORECTOMY  1970s    Social History   Socioeconomic History  . Marital status: Widowed    Spouse name: Not on file  . Number of children: 3  . Years of education: Not on file  . Highest education level: Not on file  Occupational History  . Not on file  Social Needs  . Financial resource strain: Not on file  . Food insecurity    Worry: Not on file    Inability: Not on file  . Transportation needs    Medical: Not on file    Non-medical: Not on file  Tobacco Use  . Smoking status: Former Smoker    Packs/day: 0.25    Years: 20.00    Pack years: 5.00    Types: Cigarettes    Quit date: 09/15/1993    Years since quitting: 25.4  . Smokeless tobacco: Never Used  Substance and Sexual Activity  . Alcohol use: Yes    Alcohol/week: 2.0 standard drinks    Types: 1 Glasses of wine, 1 Shots of  liquor per week    Comment: occassional  . Drug use: No  . Sexual activity: Never  Lifestyle  . Physical activity    Days per week: Not on file    Minutes per session: Not on file  . Stress: Not on file  Relationships  . Social Herbalist on phone: Not on file    Gets together: Not on file    Attends religious service: Not on file    Active member of club or organization: Not on file    Attends meetings of clubs or organizations: Not on file    Relationship status: Not on file  . Intimate partner violence    Fear of current or ex partner: Not on file    Emotionally abused: Not on file    Physically abused: Not on file    Forced sexual activity: Not on file  Other Topics Concern  . Not on file  Social History Narrative  . Not on file    Family History  Problem Relation Age of Onset  . Colon cancer Mother   . Heart disease Father   . Heart attack Father   . Prostate cancer Father   . Stroke Father   . Heart attack Brother   . Asthma Daughter   . Diabetes Other        Siblings  . Breast cancer Neg Hx     Allergies  Allergen Reactions  . Hydrocortisone Swelling  . Sulfa Antibiotics Other (See Comments)    Reaction: unknown  . Tape Dermatitis     Review of Systems   Review of Systems: Negative Unless Checked Constitutional: [] Weight loss  [] Fever  [] Chills Cardiac: [] Chest pain   []  Atrial Fibrillation  [] Palpitations   [] Shortness of breath when laying flat   [] Shortness of breath with exertion. [] Shortness of breath at rest Vascular:  [] Pain in legs with walking   [] Pain in legs with standing [] Pain in legs when laying flat   [] Claudication    [] Pain in feet when laying flat    [] History of DVT   [] Phlebitis   [x] Swelling in legs   [] Varicose veins   [] Non-healing ulcers Pulmonary:   [] Uses home oxygen   [] Productive cough   [] Hemoptysis   [] Wheeze  [] COPD   [] Asthma Neurologic:  [] Dizziness   [] Seizures  [] Blackouts [] History of stroke   [] History of  TIA  [] Aphasia   [] Temporary Blindness   [] Weakness or numbness in arm   [] Weakness or numbness in leg Musculoskeletal:   [] Joint swelling   [] Joint pain   [x] Low back pain  []  History of Knee Replacement [x] Arthritis [] back Surgeries  [x]  Spinal Stenosis    Hematologic:  [] Easy bruising  [] Easy bleeding   [] Hypercoagulable state   []   Anemic Gastrointestinal:  [] Diarrhea   [] Vomiting  [] Gastroesophageal reflux/heartburn   [] Difficulty swallowing. [] Abdominal pain Genitourinary:  [] Chronic kidney disease   [] Difficult urination  [] Anuric   [] Blood in urine [] Frequent urination  [] Burning with urination   [] Hematuria Skin:  [x] Rashes   [] Ulcers [] Wounds Psychological:  [] History of anxiety   []  History of major depression  []  Memory Difficulties      OBJECTIVE:   Physical Exam  BP (!) 153/73 (BP Location: Right Arm)   Pulse 66   Resp 15   Wt 121 lb 12.8 oz (55.2 kg)   BMI 23.01 kg/m   Gen: WD/WN, NAD Head: [place]/AT, No temporalis wasting.  Ear/Nose/Throat: Hearing grossly intact, nares w/o erythema or drainage Eyes: PER, EOMI, sclera nonicteric.  Neck: Supple, no masses.  No JVD.  Pulmonary:  Good air movement, no use of accessory muscles.  Cardiac: RRR Vascular:  2+ edema bilaterally Vessel Right Left  Radial Palpable Palpable  Dorsalis Pedis Palpable Palpable  Posterior Tibial Palpable Palpable   Gastrointestinal: soft, non-distended. No guarding/no peritoneal signs.  Musculoskeletal: M/S 5/5 throughout.  No deformity or atrophy.  Neurologic: Pain and light touch intact in extremities.  Symmetrical.  Speech is fluent. Motor exam as listed above. Psychiatric: Judgment intact, Mood & affect appropriate for pt's clinical situation. Dermatologic:  Bilateral stasis dermatitis. No Ulcers Noted.  No changes consistent with cellulitis. Lymph : No Cervical lymphadenopathy, dermal thickening bilaterally      ASSESSMENT AND PLAN:  1. Lymphedema Recommend:  No surgery or intervention at  this point in time.    I have reviewed my previous discussion with the patient regarding swelling and why it causes symptoms.  Patient will continue wearing graduated compression stockings class 1 (20-30 mmHg) on a daily basis. The patient will  wear the stockings first thing in the morning and removing them in the evening. The patient is instructed specifically not to sleep in the stockings.    In addition, behavioral modification including several periods of elevation of the lower extremities during the day will be continued.  This was reviewed with the patient during the initial visit.  The patient will also continue routine exercise, especially walking on a daily basis as was discussed during the initial visit.    Despite conservative treatments including graduated compression therapy class 1 and behavioral modification including exercise and elevation the patient  has not obtained adequate control of the lymphedema.  The patient still has stage 3 lymphedema and therefore, I believe that a lymph pump should be added to improve the control of the patient's lymphedema.  Additionally, a lymph pump is warranted because it will reduce the risk of cellulitis and ulceration in the future.  Patient should follow-up in six months    2. Mixed hyperlipidemia Continue statin as ordered and reviewed, no changes at this time   3. Essential hypertension Continue antihypertensive medications as already ordered, these medications have been reviewed and there are no changes at this time.    Current Outpatient Medications on File Prior to Visit  Medication Sig Dispense Refill  . atorvastatin (LIPITOR) 10 MG tablet TAKE 1 TABLET BY MOUTH EVERY DAY 90 tablet 4  . clopidogrel (PLAVIX) 75 MG tablet TAKE 1 TABLET (75 MG TOTAL) BY MOUTH DAILY. 90 tablet 3  . metoprolol tartrate (LOPRESSOR) 25 MG tablet Take 0.5 tablets (12.5 mg total) by mouth 2 (two) times daily. 30 tablet 6  . Multiple Vitamins-Minerals  (CENTRUM SILVER PO) Take 1 tablet by mouth daily.  Reported on 06/11/2015    . vitamin B-12 (CYANOCOBALAMIN) 1000 MCG tablet Take by mouth.    . Vitamin D, Ergocalciferol, (DRISDOL) 1.25 MG (50000 UT) CAPS capsule Take 50,000 Units by mouth every 7 (seven) days.    Marland Kitchen amLODipine (NORVASC) 5 MG tablet Take 1 tablet (5 mg total) by mouth 2 (two) times daily. 180 tablet 3  . amoxicillin-clavulanate (AUGMENTIN) 400-57 MG/5ML suspension Take 10.9 mLs (872 mg total) by mouth every 12 (twelve) hours. (Patient not taking: Reported on 02/28/2019) 100 mL 0  . hydrALAZINE (APRESOLINE) 25 MG tablet Take 1 tablet (25 mg total) by mouth 3 (three) times daily as needed. As needed for systolic blood pressure [id number]. 120 tablet 3   No current facility-administered medications on file prior to visit.     There are no Patient Instructions on file for this visit. No follow-ups on file.   Kris Hartmann, NP  This note was completed with Sales executive.  Any errors are purely unintentional.

## 2019-06-03 ENCOUNTER — Telehealth: Payer: Self-pay | Admitting: Physician Assistant

## 2019-06-03 ENCOUNTER — Encounter: Payer: Self-pay | Admitting: Cardiovascular Disease

## 2019-06-03 ENCOUNTER — Other Ambulatory Visit: Payer: Self-pay

## 2019-06-03 ENCOUNTER — Ambulatory Visit (INDEPENDENT_AMBULATORY_CARE_PROVIDER_SITE_OTHER): Payer: Medicare Other | Admitting: Cardiovascular Disease

## 2019-06-03 VITALS — BP 140/72 | HR 73 | Ht 61.0 in | Wt 121.8 lb

## 2019-06-03 DIAGNOSIS — R42 Dizziness and giddiness: Secondary | ICD-10-CM

## 2019-06-03 DIAGNOSIS — I10 Essential (primary) hypertension: Secondary | ICD-10-CM

## 2019-06-03 DIAGNOSIS — R0989 Other specified symptoms and signs involving the circulatory and respiratory systems: Secondary | ICD-10-CM

## 2019-06-03 DIAGNOSIS — R2681 Unsteadiness on feet: Secondary | ICD-10-CM

## 2019-06-03 DIAGNOSIS — E1159 Type 2 diabetes mellitus with other circulatory complications: Secondary | ICD-10-CM

## 2019-06-03 DIAGNOSIS — I639 Cerebral infarction, unspecified: Secondary | ICD-10-CM

## 2019-06-03 DIAGNOSIS — E785 Hyperlipidemia, unspecified: Secondary | ICD-10-CM

## 2019-06-03 MED ORDER — LOSARTAN POTASSIUM 25 MG PO TABS: 25.0000 mg | ORAL_TABLET | Freq: Every day | ORAL | 3 refills | Status: DC

## 2019-06-03 NOTE — Patient Instructions (Signed)
Medication Instructions:  Start losartan 25 mg daily  If you need a refill on your cardiac medications before your next appointment, please call your pharmacy.    Lab work: No new labs needed   If you have labs (blood work) drawn today and your tests are completely normal, you will receive your results only by: Marland Kitchen MyChart Message (if you have MyChart) OR . A paper copy in the mail If you have any lab test that is abnormal or we need to change your treatment, we will call you to review the results.   Testing/Procedures: No new testing needed   Follow-Up: At [facility], you and your health needs are our priority.  As part of our continuing mission to provide you with exceptional heart care, we have created designated Provider Care Teams.  These Care Teams include your primary Cardiologist (physician) and Advanced Practice Providers (APPs -  Physician Assistants and Nurse Practitioners) who all work together to provide you with the care you need, when you need it.  . You will need a follow up appointment in 6 months   . Providers on your designated Care Team:   . Murray Hodgkins, NP . [name], PA-C . Marrianne Mood, PA-C  Any Other Special Instructions Will Be Listed Below (If Applicable).  For educational health videos Log in to : [email or web address] Or : [email or web address], password : [email or web address]

## 2019-06-03 NOTE — Progress Notes (Signed)
Cardiology Office Note  Date:  06/03/2019   ID:  Claire [place], DOB 02-12-1925, MRN [id number]  PCP:  Claire Harrier, MD   Chief Complaint  Patient presents with  . office visit    Patient reports elevated BP readings at home; Meds verbally reviewed with patient.    HPI:  Claire Washington is a very pleasant 86 year-old woman with history of   hypertension, chronic dizziness, benign positional vertigo,  osteoarthritis,  hyperlipidemia,  carotid arterial disease (followed by [facility], 50% on there right),  chronic cystitis,  diabetes,  Long smoking history until age 43 Previous TIAs per the patient, CVA 2018 Chronic  dizziness in her head, gait instability, for years, progressive who presents for routine followup of her dizziness, hypertension, TIA/CVAs  Overall doing well, concerned about elevated blood pressure Recently PMD decreased norvasc to 2.5 in Am and  5 mg in the Pm for leg edema  In general blood pressures 140 sometimes [id number] systolic High BP in afternoon yesterday, in setting of ear pain/throbbing BP 170, pulse 100 Took evening meds,  Later in evening pressure 180: took hydralazine  Still with ear ache today, throbbing Does not know if it is her inner ear or blood pressure Reports she does have a doctor that takes care of her ears, dry skin in the ears  previously on losartan,  Read label, got concerned, stop the medication though denies having significant side effects  EKG personally reviewed by myself on todays visit Shows normal sinus rhythm rate 73 bpm no significant ST-T wave changes  Previously seen by neurology for chronic dizziness MRI brain without contrast done on 11/30/2017 which showed moderate atrophy and moderate microvascular ischemic changes. --old cerebellar stroke, microvascular ischemic changes, and inner ear issues: worsened.  Other past medical history reviewed Echo 08/2016 Normal LV function No significant valve disease No indication  from the echo as to cause of dizziness  Event monitor 08/2016 Normal sinus rhythm Rare PVCs, <1% No other significant arrhythmia noted Symptoms of dizziness did not seem to correlate to underlying arrhythmia  Chronic equilibrium and dizziness dating back several years, worse recently Difficult time with equilibrium.  Working with PT  Episodes of syncope in the setting of Claire issues/constipation, vasovagal episodes Sulfa allergy  syncope 08/25/2011. She was in her kitchen preparing dinner and she had acute loss of consciousness, hit her head, left temple on the refrigerator. She had a subarachnoid hemorrhage noted on evaluation at [facility], transferred to [facility] for further evaluation.   On her second episode, she reports having a cocktail, milk of magnesia with constipation. She was discharged to rehabilitation. While at rehabilitation, she was sitting on a bathroom commode, with constipation, had taken senna, she had lightheadedness and passed out for a brief period.    PMH:   has a past medical history of Arthritis, Basal cell carcinoma, Chronic cystitis, Diabetes mellitus type 2, controlled, without complications (Claire [facility]), Disease of thyroid gland, Embolic stroke (Claire [facility]), Follicular neoplasm of thyroid, Hemorrhage of rectum and anus, Hyperlipidemia, Hyperlipidemia, unspecified, Hypertension, Leg pain, Osteoarthritis, Pernicious anemia, Small vessel disease (Claire [facility]), Spinal stenosis, lumbar region without neurogenic claudication, Subdural hematoma (Claire [place]), Syncope and collapse, Thyroid nodule, and Vertigo & Chronic Dizziness.  PSH:    Past Surgical History:  Procedure Laterality Date  . BILATERAL SALPINGOOPHORECTOMY    . COLONOSCOPY  01/26/2009   Diverticulosis  . CYSTOSCOPY    . HEMORRHOIDECTOMY BY SIMPLE LIGATION    . PR COLONOSCOPY FLX W/ENDOSCOPIC MUCOSAL RESECTION  09/02/2014  Procedure: COLONOSCOPY, FLEXIBLE; WITH ENDOSCOPIC MUCOSAL RESECTION; Surgeon: Claire Revel, MD; Location: [facility] [place]; Service: Gastroenterology  . PR COLSC FLX W/RMVL OF TUMOR POLYP LESION SNARE TQ  09/02/2014   Procedure: COLONOSCOPY FLEX; W/REMOV TUMOR/LES BY SNARE; Surgeon: Claire Revel, MD; Location: [facility]; Service: Gastroenterology  . SKIN SURGERY    . TOTAL ABDOMINAL HYSTERECTOMY W/ BILATERAL SALPINGOOPHORECTOMY  1970s    Current Outpatient Medications  Medication Sig Dispense Refill  . amLODipine (NORVASC) 5 MG tablet Take 1 tablet (5 mg total) by mouth 2 (two) times daily. (Patient taking differently: Take 5 mg by mouth as directed. 2.5mg  in AM and 5 mg in PM) 180 tablet 3  . atorvastatin (LIPITOR) 10 MG tablet TAKE 1 TABLET BY MOUTH EVERY DAY 90 tablet 4  . clopidogrel (PLAVIX) 75 MG tablet TAKE 1 TABLET (75 MG TOTAL) BY MOUTH DAILY. 90 tablet 3  . metoprolol tartrate (LOPRESSOR) 25 MG tablet Take 0.5 tablets (12.5 mg total) by mouth 2 (two) times daily. 30 tablet 6  . Multiple Vitamins-Minerals (CENTRUM SILVER PO) Take 1 tablet by mouth daily. Reported on 06/11/2015    . vitamin B-12 (CYANOCOBALAMIN) 1000 MCG tablet Take by mouth daily.     . Vitamin D, Ergocalciferol, (DRISDOL) 1.25 MG (50000 UT) CAPS capsule Take 50,000 Units by mouth every 7 (seven) days.    Claire [place] Kitchen amoxicillin-clavulanate (AUGMENTIN) 400-57 MG/5ML suspension Take 10.9 mLs (872 mg total) by mouth every 12 (twelve) hours. (Patient not taking: Reported on 02/28/2019) 100 mL 0  . hydrALAZINE (APRESOLINE) 25 MG tablet Take 1 tablet (25 mg total) by mouth 3 (three) times daily as needed. As needed for systolic blood pressure [id number]. 120 tablet 3   No current facility-administered medications for this visit.     Allergies:   Hydrocortisone, Sulfa antibiotics, and Tape   Social History:  The patient  reports that she quit smoking about 25 years ago. Her smoking use included cigarettes. She has a 5.00 pack-year smoking history. She has never used smokeless tobacco. She reports current alcohol  use of about 2.0 standard drinks of alcohol per week. She reports that she does not use drugs.   Family History:   family history includes Asthma in her daughter; Colon cancer in her mother; Diabetes in an other family member; Heart attack in her brother and father; Heart disease in her father; Prostate cancer in her father; Stroke in her father.    Review of Systems  Constitutional: Negative.   HENT: Negative.   Respiratory: Negative.   Cardiovascular: Negative.   Gastrointestinal: Negative.   Musculoskeletal: Negative.   Neurological: Positive for dizziness.  Psychiatric/Behavioral: Negative.   All other systems reviewed and are negative.    PHYSICAL EXAM: VS:  BP 140/72 (BP Location: Left Arm, Patient Position: Sitting, Cuff Size: Normal)   Pulse 73   Ht 5\' 1"  (1.549 m)   Wt 121 lb 12 oz (55.2 kg)   SpO2 95%   BMI 23.00 kg/m  , BMI Body mass index is 23 kg/m. Constitutional:  oriented to person, place, and time. No distress.  HENT:  Head: Grossly normal Eyes:  no discharge. No scleral icterus.  Neck: No JVD, no carotid bruits  Cardiovascular: Regular rate and rhythm, no murmurs appreciated Pulmonary/Chest: Clear to auscultation bilaterally, no wheezes or rails Abdominal: Soft.  no distension.  no tenderness.  Musculoskeletal: Normal range of motion Neurological:  normal muscle tone. Coordination normal. No atrophy Skin: Skin warm and dry  Psychiatric: normal affect, pleasant   Recent Labs: 12/26/2018: ALT 17; TSH 4.292 12/28/2018: Hemoglobin 12.6; Platelets 178 12/30/2018: BUN 15; Creatinine, Ser 0.53; Magnesium 2.2; Potassium 3.8; Sodium 140    Lipid Panel No results found for: CHOL, HDL, LDLCALC, TRIG    Wt Readings from Last 3 Encounters:  06/03/19 121 lb 12 oz (55.2 kg)  03/05/19 121 lb 12.8 oz (55.2 kg)  02/28/19 120 lb (54.4 kg)     ASSESSMENT AND PLAN:  Other secondary hypertension Long discussion concerning her medications Recommended she stay on  her current medications For high blood pressure we will add losartan 25 mg daily Discussed that is a safe medications, her renal function is normal, typically has minimal side effects The dose can be titrated upwards as needed for hypertension  Dizziness MRI showing  Strokes Also with microvascular disease Stable symptoms, followed by neurology   Stroke Multiple embolic strokes on MRI moderate carotid disease on the right followed by [facility] [place] Followed by neurology Continue Plavix, aggressive lipid management  Smoking hx Currently a nonsmoker  Type 2 diabetes mellitus with other circulatory complication, without long-term current use of insulin ([place]) Unable to exercise secondary to gait instability Hemoglobin A1c 5.8  Mixed hyperlipidemia Tolerating Lipitor Lab work through primary care, total cholesterol 176 Periodically misses doses    Total encounter time more than 25 minutes  Greater than 50% was spent in counseling and coordination of care with the patient   Disposition:   F/U  12 months   Orders Placed This Encounter  Procedures  . EKG 12-Lead     Signed, Esmond Plants, M.D., Ph.D. 06/03/2019  [place], [place]

## 2019-06-03 NOTE — Telephone Encounter (Signed)
Spoke with the patient.  Patient sts that she is past due for follow up. Patient sts that her BP has been elevated the last couple of days. She is taking her BP medications as prescribed, including her Hydralazine 25mg  for systolic BP [id number]. Patient sts that yesterday she noticed that she could hear her heartbeat in her. She denies any stroke like symptoms, no change in vision, speech, hearing. She denies heart palpitation, sob, swelling.   Patient sts that her BP was better this morning but she still hear her heartbeat in her ear. Patient was suppose to f/u in Aug 2020 last seen in office in Feb 2020. Advised the patient that I would recommend scheduling an appt for her to be seen an evaluated.  Patient is agreeable. Appt scheduled for today @ 3:20pm. Patient is aware of the appointment day, time, and location.Pt adv to enter through the medical mall entrance and to allot enough time to get down to our office on time. Patient verbalized understanding and voiced appreciation for the call.

## 2019-06-03 NOTE — Telephone Encounter (Signed)
Patient states she had an episode last night she felt her heartbeating in her ear. States her BP after medication was 183/75. This morning BP 154/56 HR 78. States she still is experiencing the beating in her ear.

## 2019-06-18 ENCOUNTER — Other Ambulatory Visit: Payer: Self-pay | Admitting: Cardiovascular Disease

## 2019-08-08 ENCOUNTER — Other Ambulatory Visit: Payer: Self-pay | Admitting: Cardiovascular Disease

## 2019-08-21 ENCOUNTER — Other Ambulatory Visit: Payer: Self-pay | Admitting: Cardiovascular Disease

## 2019-09-02 ENCOUNTER — Encounter (INDEPENDENT_AMBULATORY_CARE_PROVIDER_SITE_OTHER): Payer: Self-pay | Admitting: Vascular Surgery

## 2019-09-02 ENCOUNTER — Ambulatory Visit (INDEPENDENT_AMBULATORY_CARE_PROVIDER_SITE_OTHER): Payer: Medicare Other | Admitting: Vascular Surgery

## 2019-09-02 ENCOUNTER — Other Ambulatory Visit: Payer: Self-pay

## 2019-09-02 VITALS — BP 150/69 | HR 76 | Resp 16 | Wt 123.5 lb

## 2019-09-02 DIAGNOSIS — I6523 Occlusion and stenosis of bilateral carotid arteries: Secondary | ICD-10-CM

## 2019-09-02 DIAGNOSIS — I10 Essential (primary) hypertension: Secondary | ICD-10-CM

## 2019-09-02 DIAGNOSIS — I890 Lymphedema, not elsewhere classified: Secondary | ICD-10-CM | POA: Diagnosis not present

## 2019-09-02 DIAGNOSIS — E1159 Type 2 diabetes mellitus with other circulatory complications: Secondary | ICD-10-CM | POA: Diagnosis not present

## 2019-09-02 NOTE — Assessment & Plan Note (Signed)
blood pressure control important in reducing the progression of atherosclerotic disease. On appropriate oral medications.  

## 2019-09-02 NOTE — Assessment & Plan Note (Signed)
Leg symptoms are currently well controlled with her lymphedema pump.  Would continue to the use this and recheck in 1 year.

## 2019-09-02 NOTE — Progress Notes (Signed)
MRN : [id number]  Claire Washington is a 86 y.o. (1924-10-26) female who presents with chief complaint of  Chief Complaint  Patient presents with  . Follow-up    69month follow up  .  History of Present Illness: Patient returns today in follow up of her leg swelling.  This is doing much better with the use of her pump elevating her legs.  She is having more problems with her arms and is scheduled see a dermatologist for this tomorrow.  No new ulceration or infection in the legs.  She is getting around pretty well  Current Outpatient Medications  Medication Sig Dispense Refill  . amLODipine (NORVASC) 5 MG tablet TAKE 1 TABLET BY MOUTH TWICE A DAY 180 tablet 2  . atorvastatin (LIPITOR) 10 MG tablet TAKE 1 TABLET BY MOUTH EVERY DAY 90 tablet 1  . clopidogrel (PLAVIX) 75 MG tablet TAKE 1 TABLET BY MOUTH EVERY DAY 90 tablet 0  . losartan (COZAAR) 25 MG tablet Take 1 tablet (25 mg total) by mouth daily. 90 tablet 3  . metoprolol tartrate (LOPRESSOR) 25 MG tablet Take 0.5 tablets (12.5 mg total) by mouth 2 (two) times daily. 30 tablet 6  . Multiple Vitamins-Minerals (CENTRUM SILVER PO) Take 1 tablet by mouth daily. Reported on 06/11/2015    . vitamin B-12 (CYANOCOBALAMIN) 1000 MCG tablet Take by mouth daily.     . Vitamin D, Ergocalciferol, (DRISDOL) 1.25 MG (50000 UT) CAPS capsule Take 50,000 Units by mouth every 7 (seven) days.    Marland Kitchen amoxicillin-clavulanate (AUGMENTIN) 400-57 MG/5ML suspension Take 10.9 mLs (872 mg total) by mouth every 12 (twelve) hours. (Patient not taking: Reported on 02/28/2019) 100 mL 0  . hydrALAZINE (APRESOLINE) 25 MG tablet Take 1 tablet (25 mg total) by mouth 3 (three) times daily as needed. As needed for systolic blood pressure [id number]. 120 tablet 3   No current facility-administered medications for this visit.    Past Medical History:  Diagnosis Date  . Arthritis   . Basal cell carcinoma    basel cell  . Chronic cystitis   . Diabetes mellitus type 2,  controlled, without complications ([place])    type 2 or unspecified type diabetes mellitus without mention of complication, not stated as uncontrolled  . Disease of thyroid gland   . Embolic stroke ([place])    a. 07/2016 MRI Brain: small right superior cerebellum and left lateral occipital cortex infarcts;  b. 08/2016 Echo: EF 60-65%, Gr1 DD, mild MR;  c. 08/2016 30 Day Event Monitor: No significant tachy/bradyarrhythmias. No afib.  . Follicular neoplasm of thyroid   . Hemorrhage of rectum and anus   . Hyperlipidemia   . Hyperlipidemia, unspecified   . Hypertension   . Leg pain   . Osteoarthritis   . Pernicious anemia   . Small vessel disease ([place])   . Spinal stenosis, lumbar region without neurogenic claudication   . Subdural hematoma ([place])    a. 2013 in setting of syncope.  . Syncope and collapse    a. 2013 - syncope and fall w/ resultant subdural hematoma; b. prev nl echo and stress test @ [facility].  Marland Kitchen Thyroid nodule   . Vertigo & Chronic Dizziness    a. Previously seen by ENT with Physical Therapy.  MRI 2000 small vessel disease    Past Surgical History:  Procedure Laterality Date  . BILATERAL SALPINGOOPHORECTOMY    . COLONOSCOPY  01/26/2009   Diverticulosis  . CYSTOSCOPY    . HEMORRHOIDECTOMY BY  SIMPLE LIGATION    . PR COLONOSCOPY FLX W/ENDOSCOPIC MUCOSAL RESECTION  09/02/2014   Procedure: COLONOSCOPY, FLEXIBLE; WITH ENDOSCOPIC MUCOSAL RESECTION; Surgeon: Saunders Revel, MD; [facility]; Service: Gastroenterology  . PR COLSC FLX W/RMVL OF TUMOR POLYP LESION SNARE TQ  09/02/2014   Procedure: COLONOSCOPY FLEX; W/REMOV TUMOR/LES BY SNARE; Surgeon: Saunders Revel, MD; Location: [facility]; Service: Gastroenterology  . SKIN SURGERY    . TOTAL ABDOMINAL HYSTERECTOMY W/ BILATERAL SALPINGOOPHORECTOMY  1970s     Social History   Tobacco Use  . Smoking status: Former Smoker    Packs/day: 0.25    Years: 20.00    Pack years: 5.00     Types: Cigarettes    Quit date: 09/15/1993    Years since quitting: 25.9  . Smokeless tobacco: Never Used  Substance Use Topics  . Alcohol use: Yes    Alcohol/week: 2.0 standard drinks    Types: 1 Glasses of wine, 1 Shots of liquor per week    Comment: occassional  . Drug use: No      Family History  Problem Relation Age of Onset  . Colon cancer Mother   . Heart disease Father   . Heart attack Father   . Prostate cancer Father   . Stroke Father   . Heart attack Brother   . Asthma Daughter   . Diabetes Other        Siblings  . Breast cancer Neg Hx     Allergies  Allergen Reactions  . Hydrocortisone Swelling  . Sulfa Antibiotics Other (See Comments)    Reaction: unknown  . Tape Dermatitis      REVIEW OF SYSTEMS(Negative unless checked)  Constitutional: [] ??Weight loss [] ??Fever [] ??Chills Cardiac: [] ??Chest pain [] ??Chest pressure [] ??Palpitations [] ??Shortness of breath when laying flat [] ??Shortness of breath at rest [] ??Shortness of breath with exertion. Vascular: [] ??Pain in legs with walking [] ??Pain in legs at rest [] ??Pain in legs when laying flat [] ??Claudication [] ??Pain in feet when walking [] ??Pain in feet at rest [] ??Pain in feet when laying flat [] ??History of DVT [] ??Phlebitis [] ??Swelling in legs [] ??Varicose veins [] ??Non-healing ulcers Pulmonary: [] ??Uses home oxygen [] ??Productive cough [] ??Hemoptysis [] ??Wheeze [] ??COPD [] ??Asthma Neurologic: [x] ??Dizziness [] ??Blackouts [] ??Seizures [] ??History of stroke [] ??History of TIA [] ??Aphasia [] ??Temporary blindness [] ??Dysphagia [] ??Weakness or numbness in arms [] ??Weakness or numbness in legs Musculoskeletal: [x] ??Arthritis [] ??Joint swelling [] ??Joint pain [x] ??Low back pain Hematologic: [] ??Easy bruising [] ??Easy bleeding [] ??Hypercoagulable state [x] ??Anemic [] ??Hepatitis Gastrointestinal: [] ??Blood in stool [] ??Vomiting blood  [] ??Gastroesophageal reflux/heartburn [] ??Difficulty swallowing. Genitourinary: [] ??Chronic kidney disease [] ??Difficult urination [] ??Frequent urination [] ??Burning with urination [] ??Blood in urine Skin: [] ??Rashes [] ??Ulcers [] ??Wounds Psychological: [] ??History of anxiety [] ??History of major depression.  Physical Examination  BP (!) 150/69 (BP Location: Right Arm)   Pulse 76   Resp 16   Wt 123 lb 8.6 oz (56 kg)   BMI 23.34 kg/m  Gen:  WD/WN, NAD.  Appears younger than stated age Head: [place]/AT, No temporalis wasting. Ear/Nose/Throat: Hearing grossly intact, nares w/o erythema or drainage Eyes: Conjunctiva clear. Sclera non-icteric Neck: Supple.  Trachea midline Pulmonary:  Good air movement, no use of accessory muscles.  Cardiac: RRR, no JVD Vascular:  Vessel Right Left  Radial Palpable Palpable                   Musculoskeletal: M/S 5/5 throughout.  No deformity or atrophy.  Trace lower extremity edema. Neurologic: Sensation grossly intact in extremities.  Symmetrical.  Speech is fluent.  Psychiatric: Judgment intact, Mood & affect appropriate for pt's  clinical situation. Dermatologic: No rashes or ulcers noted.  No cellulitis or open wounds.       Labs No results found for this or any previous visit (from the past 2160 hour(s)).  Radiology No results found.  Assessment/Plan  Carotid stenosis To be checked later this year.  No new symptoms.  Hypertension blood pressure control important in reducing the progression of atherosclerotic disease. On appropriate oral medications.   Diabetes mellitus blood glucose control important in reducing the progression of atherosclerotic disease. Also, involved in wound healing. On appropriate medications.   Lymphedema Leg symptoms are currently well controlled with her lymphedema pump.  Would continue to the use this and recheck in 1 year.    Leotis Pain, MD  09/02/2019 2:28 PM    This note was  created with Dragon medical transcription system.  Any errors from dictation are purely unintentional

## 2019-09-02 NOTE — Assessment & Plan Note (Signed)
blood glucose control important in reducing the progression of atherosclerotic disease. Also, involved in wound healing. On appropriate medications.  

## 2019-09-02 NOTE — Assessment & Plan Note (Signed)
To be checked later this year.  No new symptoms.

## 2019-09-08 ENCOUNTER — Telehealth: Payer: Self-pay | Admitting: Cardiovascular Disease

## 2019-09-08 NOTE — Telephone Encounter (Signed)
Spoke with patient and she reports that these readings are before medications and that she is not able to always check her numbers 2 hours after medications due to therapy and so forth. She did report these numbers after medications. Inquired if she was taking any of the hydralazine and she denied stating that her numbers have not been higher than 170. She did want to know if her lymphedema may have something to do with this. Advised that I would send all of this information over to Dr. Rockey Situ for review and recommendations. She verbalized understanding with no further questions at this time.   May 11  Before meds 159/67 149/59  May 19  128/55  May 21 147/62

## 2019-09-08 NOTE — Telephone Encounter (Signed)
Pt c/o BP issue: STAT if pt c/o blurred vision, one-sided weakness or slurred speech  1. What are your last 5 BP readings?    151/60  75   154/61  68     153/63  71   149/59  63   159/63  73   140/63  74   152/64  80   134/56  66    152/55  102 night   159/65  80 Night    153/56  94 Night   2. Are you having any other symptoms (ex. Dizziness, headache, blurred vision, passed out)?  No  Nothing any different   3. What is your BP issue? After last visit medication was added and has not helped .  Patient not sure if lymphedema is related

## 2019-09-09 NOTE — Telephone Encounter (Signed)
Patient returning call.

## 2019-09-09 NOTE — Telephone Encounter (Signed)
Left voicemail message to call back  

## 2019-09-09 NOTE — Telephone Encounter (Signed)
Patient states her pressures are down to 139/54. Please call to discuss.

## 2019-09-09 NOTE — Telephone Encounter (Signed)
Spoke with patient and she reports her blood pressures went down but that they are erratic and go up and down. Advised that I forwarded her notes with blood pressure readings from first two calls and that I will have him review and see if there are additional recommendations. Let her know that once he reviews I would give her a call back. Let her know that I would be out tomorrow but would be here Thursday and Friday. She verbalized understanding of our conversation, agreement with plan, and had no further questions.

## 2019-09-16 NOTE — Telephone Encounter (Signed)
Called and Left a VM to return call.

## 2019-09-16 NOTE — Telephone Encounter (Signed)
Would confirm she is taking losartan 25 daily We can increase the losartan to 50 mg daily, Check pressures 2 hours after medictions

## 2019-09-17 MED ORDER — LOSARTAN POTASSIUM 25 MG PO TABS: 50.0000 mg | ORAL_TABLET | Freq: Every day | ORAL | 3 refills | Status: DC

## 2019-09-17 NOTE — Telephone Encounter (Signed)
Patient is returning the call  

## 2019-09-17 NOTE — Telephone Encounter (Signed)
Call to patient to review POC from Dr. Rockey Situ. Pt verbalized understanding and is agreeable to POC.   Rx updated.   She will call Monday with updated VS.

## 2019-10-17 ENCOUNTER — Other Ambulatory Visit: Payer: Self-pay | Admitting: Internal Medicine

## 2019-10-17 DIAGNOSIS — Z1231 Encounter for screening mammogram for malignant neoplasm of breast: Secondary | ICD-10-CM

## 2019-11-04 ENCOUNTER — Ambulatory Visit
Admission: RE | Admit: 2019-11-04 | Discharge: 2019-11-04 | Disposition: A | Discharge status: Home / Self Care | Payer: Medicare Other | Source: Ambulatory Visit | Attending: Internal Medicine | Admitting: Internal Medicine

## 2019-11-04 DIAGNOSIS — Z1231 Encounter for screening mammogram for malignant neoplasm of breast: Secondary | ICD-10-CM | POA: Diagnosis present

## 2019-11-13 ENCOUNTER — Other Ambulatory Visit: Payer: Self-pay | Admitting: Cardiovascular Disease

## 2019-11-29 NOTE — Progress Notes (Signed)
Cardiology Office Note  Date:  12/01/2019   ID:  Claire Washington, DOB 17-Jul-1924, MRN [id number]  PCP:  Tracie Harrier, MD   Chief Complaint  Patient presents with  . Follow-up    6 month/ Pt. c/o LE edema & blood pressure fluctuating.     HPI:  Claire Washington is a very pleasant 86 year-old woman with history of   hypertension, chronic dizziness, benign positional vertigo,  osteoarthritis,  hyperlipidemia,  carotid arterial disease (followed by Dr. Lucky Cowboy, 50% on there right),  chronic cystitis,  diabetes,  Long smoking history until age 47 Previous TIAs per the patient, CVA 2018 Carotid stenosis <39% b/l in 2019 Chronic  dizziness in her head, gait instability, for years, progressive who presents for routine followup of her dizziness, hypertension, TIA/CVAs  Last seen in clinic by myself February 2021 In follow-up today she reports having "erratic blood pressure' "on and off losartan" Previously taking 25 mg twice daily, sometimes only takes it once a day if at all  Blood pressure in the morning before any medications typically 948 systolic 2 hours after taking medications blood pressure 546 up to 270 systolic  In the evening blood pressure 350K systolic  3 months ago, had garbled speech, Confused , Lasted a few minutes Resolved, no lasting sx  Hip pain, insomnia secondary to pain  EKG personally reviewed by myself on todays visit Shows normal sinus rhythm rate 70 bpm no significant ST-T wave changes  Previously seen by neurology for chronic dizziness MRI brain without contrast done on 11/30/2017 which showed moderate atrophy and moderate microvascular ischemic changes. --old cerebellar stroke, microvascular ischemic changes, and inner ear issues: worsened.  Other past medical history reviewed Echo 08/2016 Normal LV function No significant valve disease No indication from the echo as to cause of dizziness  Event monitor 08/2016 Normal sinus rhythm Rare  PVCs, <1% No other significant arrhythmia noted Symptoms of dizziness did not seem to correlate to underlying arrhythmia  Chronic equilibrium and dizziness dating back several years, worse recently Difficult time with equilibrium.  Working with PT   PMH:   has a past medical history of Arthritis, Basal cell carcinoma, Chronic cystitis, Diabetes mellitus type 2, controlled, without complications ([place]), Disease of thyroid gland, Embolic stroke ([place]), Follicular neoplasm of thyroid, Hemorrhage of rectum and anus, Hyperlipidemia, Hyperlipidemia, unspecified, Hypertension, Leg pain, Osteoarthritis, Pernicious anemia, Small vessel disease ([place]), Spinal stenosis, lumbar region without neurogenic claudication, Subdural hematoma ([place]), Syncope and collapse, Thyroid nodule, and Vertigo & Chronic Dizziness.  PSH:    Past Surgical History:  Procedure Laterality Date  . BILATERAL SALPINGOOPHORECTOMY    . BREAST CYST EXCISION     ? side, yrs ago  . COLONOSCOPY  01/26/2009   Diverticulosis  . CYSTOSCOPY    . HEMORRHOIDECTOMY BY SIMPLE LIGATION    . PR COLONOSCOPY FLX W/ENDOSCOPIC MUCOSAL RESECTION  09/02/2014   Procedure: COLONOSCOPY, FLEXIBLE; WITH ENDOSCOPIC MUCOSAL RESECTION; Surgeon: Saunders Revel, MD; Location: [facility]; Service: Gastroenterology  . PR COLSC FLX W/RMVL OF TUMOR POLYP LESION SNARE TQ  09/02/2014   Procedure: COLONOSCOPY FLEX; W/REMOV TUMOR/LES BY SNARE; Surgeon: Saunders Revel, MD; Location: [facility]; Service: Gastroenterology  . SKIN SURGERY    . TOTAL ABDOMINAL HYSTERECTOMY W/ BILATERAL SALPINGOOPHORECTOMY  1970s    Current Outpatient Medications  Medication Sig Dispense Refill  . amLODipine (NORVASC) 5 MG tablet TAKE 1 TABLET BY MOUTH TWICE A DAY 180 tablet 2  . atorvastatin (LIPITOR) 10 MG  tablet TAKE 1 TABLET BY MOUTH EVERY DAY 90 tablet 1  . clopidogrel (PLAVIX) 75 MG tablet TAKE 1 TABLET BY MOUTH EVERY DAY 90 tablet 0  .  hydrALAZINE (APRESOLINE) 25 MG tablet Take 1 tablet (25 mg total) by mouth 3 (three) times daily as needed. As needed for systolic blood pressure >562. 120 tablet 3  . losartan (COZAAR) 25 MG tablet Take 2 tablets (50 mg total) by mouth daily. 180 tablet 3  . metoprolol tartrate (LOPRESSOR) 25 MG tablet Take 0.5 tablets (12.5 mg total) by mouth 2 (two) times daily. 30 tablet 6  . Multiple Vitamins-Minerals (CENTRUM SILVER PO) Take 1 tablet by mouth daily. Reported on 06/11/2015    . vitamin B-12 (CYANOCOBALAMIN) 1000 MCG tablet Take by mouth daily.     . Vitamin D, Ergocalciferol, (DRISDOL) 1.25 MG (50000 UT) CAPS capsule Take 50,000 Units by mouth every 7 (seven) days.     No current facility-administered medications for this visit.    Allergies:   Hydrocortisone, Sulfa antibiotics, and Tape   Social History:  The patient  reports that she quit smoking about 26 years ago. Her smoking use included cigarettes. She has a 5.00 pack-year smoking history. She has never used smokeless tobacco. She reports current alcohol use of about 2.0 standard drinks of alcohol per week. She reports that she does not use drugs.   Family History:   family history includes Asthma in her daughter; Colon cancer in her mother; Diabetes in an other family member; Heart attack in her brother and father; Heart disease in her father; Prostate cancer in her father; Stroke in her father.   Review of Systems  Constitutional: Negative.   HENT: Negative.   Respiratory: Negative.   Cardiovascular: Negative.   Gastrointestinal: Negative.   Musculoskeletal: Negative.   Neurological: Positive for dizziness.  Psychiatric/Behavioral: Negative.   All other systems reviewed and are negative.   PHYSICAL EXAM: VS:  BP (!) 130/56 (BP Location: Left Arm, Patient Position: Sitting, Cuff Size: Normal)   Pulse 70   Ht 5' (1.524 m)   Wt 122 lb 2 oz (55.4 kg)   SpO2 95%   BMI 23.85 kg/m  , BMI Body mass index is 23.85  kg/m. Constitutional:  oriented to person, place, and time. No distress.  HENT:  Head: Grossly normal Eyes:  no discharge. No scleral icterus.  Neck: No JVD, no carotid bruits  Cardiovascular: Regular rate and rhythm, no murmurs appreciated Pulmonary/Chest: Clear to auscultation bilaterally, no wheezes or rails Abdominal: Soft.  no distension.  no tenderness.  Musculoskeletal: Normal range of motion Neurological:  normal muscle tone. Coordination normal. No atrophy Skin: Skin warm and dry Psychiatric: normal affect, pleasant  Recent Labs: 12/26/2018: ALT 17; TSH 4.292 12/28/2018: Hemoglobin 12.6; Platelets 178 12/30/2018: BUN 15; Creatinine, Ser 0.53; Magnesium 2.2; Potassium 3.8; Sodium 140    Lipid Panel No results found for: CHOL, HDL, LDLCALC, TRIG    Wt Readings from Last 3 Encounters:  12/01/19 122 lb 2 oz (55.4 kg)  09/02/19 123 lb 8.6 oz (56 kg)  06/03/19 121 lb 12 oz (55.2 kg)     ASSESSMENT AND PLAN:  Other secondary hypertension Blood pressure elevated in the morning, recommend she take losartan 25 mg in the evening May need additional losartan 20 5 in the morning if blood pressure continues to run high Missing doses at times, blood pressure somewhat labile  Dizziness MRI showing  Strokes, microvascular disease  followed by neurology Stable, recommended regular exercise  program  Stroke Multiple embolic strokes on MRI moderate carotid disease on the right followed by Dr. Lucky Cowboy On Plavix, statin/ Lipitor  Smoking hx Currently a nonsmoker  Type 2 diabetes mellitus with other circulatory complication, without long-term current use of insulin (HCC) Hemoglobin A1c 5.8 Recommend exercise program for stability  Mixed hyperlipidemia Goal LDL less than 70, continue Lipitor    Total encounter time more than 25 minutes  Greater than 50% was spent in counseling and coordination of care with the patient   Disposition:   F/U  12 months   Orders Placed This  Encounter  Procedures  . EKG 12-Lead     Signed, Esmond Plants, M.D., Ph.D. 12/01/2019  [place], [place]

## 2019-12-01 ENCOUNTER — Other Ambulatory Visit: Payer: Self-pay

## 2019-12-01 ENCOUNTER — Ambulatory Visit (INDEPENDENT_AMBULATORY_CARE_PROVIDER_SITE_OTHER): Payer: Medicare Other | Admitting: Cardiovascular Disease

## 2019-12-01 ENCOUNTER — Encounter: Payer: Self-pay | Admitting: Cardiovascular Disease

## 2019-12-01 VITALS — BP 130/56 | HR 70 | Ht 60.0 in | Wt 122.1 lb

## 2019-12-01 DIAGNOSIS — R0989 Other specified symptoms and signs involving the circulatory and respiratory systems: Secondary | ICD-10-CM

## 2019-12-01 DIAGNOSIS — E1159 Type 2 diabetes mellitus with other circulatory complications: Secondary | ICD-10-CM | POA: Diagnosis not present

## 2019-12-01 DIAGNOSIS — E785 Hyperlipidemia, unspecified: Secondary | ICD-10-CM

## 2019-12-01 DIAGNOSIS — Z8673 Personal history of transient ischemic attack (TIA), and cerebral infarction without residual deficits: Secondary | ICD-10-CM

## 2019-12-01 DIAGNOSIS — R42 Dizziness and giddiness: Secondary | ICD-10-CM

## 2019-12-01 DIAGNOSIS — I6523 Occlusion and stenosis of bilateral carotid arteries: Secondary | ICD-10-CM

## 2019-12-01 DIAGNOSIS — I10 Essential (primary) hypertension: Secondary | ICD-10-CM | POA: Diagnosis not present

## 2019-12-01 NOTE — Patient Instructions (Addendum)

## 2019-12-27 ENCOUNTER — Other Ambulatory Visit: Payer: Self-pay | Admitting: Cardiovascular Disease

## 2020-01-30 ENCOUNTER — Ambulatory Visit: Payer: Medicare Other | Attending: Internal Medicine

## 2020-01-30 ENCOUNTER — Other Ambulatory Visit: Payer: Self-pay

## 2020-01-30 DIAGNOSIS — R42 Dizziness and giddiness: Secondary | ICD-10-CM | POA: Diagnosis not present

## 2020-01-30 DIAGNOSIS — H8112 Benign paroxysmal vertigo, left ear: Secondary | ICD-10-CM | POA: Diagnosis present

## 2020-01-30 NOTE — Therapy (Signed)
[place] MAIN [facility] SERVICES [address] [place], [place], [address] Phone: [phone]   Fax:  [phone]  Physical Therapy Evaluation  Patient Details  Name: Claire Washington MRN: [id number] Date of Birth: 28-Jun-1924 Referring Provider (PT): Dr. Ginette Pitman   Encounter Date: 01/30/2020   PT End of Session - 01/30/20 0811    Visit Number 1    Number of Visits 9    Date for PT Re-Evaluation 03/26/20    Authorization Type eval: 01/30/20    PT Start Time 0800    PT Stop Time 0900    PT Time Calculation (min) 60 min    Activity Tolerance Patient tolerated treatment well    Behavior During Therapy [facility] for tasks assessed/performed           Past Medical History:  Diagnosis Date  . Arthritis   . Basal cell carcinoma    basel cell  . Chronic cystitis   . Diabetes mellitus type 2, controlled, without complications ([place])    type 2 or unspecified type diabetes mellitus without mention of complication, not stated as uncontrolled  . Disease of thyroid gland   . Embolic stroke ([place])    a. 07/2016 MRI Brain: small right superior cerebellum and left lateral occipital cortex infarcts;  b. 08/2016 Echo: EF 60-65%, Gr1 DD, mild MR;  c. 08/2016 30 Day Event Monitor: No significant tachy/bradyarrhythmias. No afib.  . Follicular neoplasm of thyroid   . Hemorrhage of rectum and anus   . Hyperlipidemia   . Hyperlipidemia, unspecified   . Hypertension   . Leg pain   . Osteoarthritis   . Pernicious anemia   . Small vessel disease ([place])   . Spinal stenosis, lumbar region without neurogenic claudication   . Subdural hematoma ([place])    a. 2013 in setting of syncope.  . Syncope and collapse    a. 2013 - syncope and fall w/ resultant subdural hematoma; b. prev nl echo and stress test @ [facility].  Marland Kitchen Thyroid nodule   . Vertigo & Chronic Dizziness    a. Previously seen by ENT with Physical Therapy.  MRI 2000 small vessel disease    Past Surgical  History:  Procedure Laterality Date  . BILATERAL SALPINGOOPHORECTOMY    . BREAST CYST EXCISION     ? side, yrs ago  . COLONOSCOPY  01/26/2009   Diverticulosis  . CYSTOSCOPY    . HEMORRHOIDECTOMY BY SIMPLE LIGATION    . PR COLONOSCOPY FLX W/ENDOSCOPIC MUCOSAL RESECTION  09/02/2014   Procedure: COLONOSCOPY, FLEXIBLE; WITH ENDOSCOPIC MUCOSAL RESECTION; Surgeon: Saunders Revel, MD; Location: [facility]; Service: Gastroenterology  . PR COLSC FLX W/RMVL OF TUMOR POLYP LESION SNARE TQ  09/02/2014   Procedure: COLONOSCOPY FLEX; W/REMOV TUMOR/LES BY SNARE; Surgeon: Saunders Revel, MD; Location: [facility]; Service: Gastroenterology  . SKIN SURGERY    . TOTAL ABDOMINAL HYSTERECTOMY W/ BILATERAL SALPINGOOPHORECTOMY  1970s    There were no vitals filed for this visit.    Subjective Assessment - 01/30/20 0807    Subjective Vertigo    Pertinent History Pt reports that about 3 weeks ago she was laying on the floor performing her leg exercises when she started having a spinning sensation. Symptoms lasted for a few seconds. Vertigo occurred one additional time but pt is unable to recall the circumstances. She saw her doctor who referred her to vestibular therapy. She reports some constant low level dizziness as well. Pt is known  to this clinic as she has been previously undergone vestibular therapy. Pt with prior history of 59% R UVH, bilateral BPPV which has been worse in the past on the L side compared to the right, and R superior cerebellar and left lateral occipital infarcts from 2018. Pt has a history of spinal stenosis with low back pain and bilateral hip pain. She was recently treated for this issue with a steroid taper by her MD. She was also having R wrist swelling/pain and was referred to an orthopedist who told her it was arthritis. Pt states that it has improved and she is not concerned about her wrist at this time. She also reports that she has seen another  physical therapist for her back and hip pain with extensive treatment but no real help. She is not interested in pursuing additional treatment for this issue at this time either. Pt has a history of moderate cervical spinal canal narrowing at C4-5 and C5-6 due to disc osteophyte.and severe right neuroforaminal narrowing at C5-6. She also has a history of anxiety and panic attacks. She has seen [facility] neurology for her constant dizziness in the past which was determined to be multifactorial  Related to old cerebellar stroke, microvascular ischemic changes, and inner ear issues.    Limitations Walking    Diagnostic tests 11/30/17: MRI Brain without contrast: No acute intracranial abnormality. Stable non contrast MRI appearance of the brain, with no evidence of significant prior posterior circulation ischemia.    Patient Stated Goals Improve strength, balance, and dizziness    Currently in Pain? No/denies   Pt does have chronic low back pain and hip pain but not hurting currently             [facility] PT Assessment - 01/30/20 0808      Assessment   Medical Diagnosis Dizziness    Referring Provider (PT) Dr. Ginette Pitman    Onset Date/Surgical Date 01/09/20    Hand Dominance Right    Next MD Visit Not reported    Prior Therapy Yes      Precautions   Precautions Fall      Restrictions   Weight Bearing Restrictions No      Balance Screen   Has the patient fallen in the past 6 months Yes    How many times? 1    Has the patient had a decrease in activity level because of a fear of falling?  Yes    Is the patient reluctant to leave their home because of a fear of falling?  No      Home Environment   Living Environment Private residence    Living Arrangements Alone    Available Help at Discharge Family    Type of [place] Access Level entry    G[place]iel --   Three wheel walker   Additional Comments Pt lives at [place]   Overall Cognitive Status No family/caregiver present to determine baseline cognitive functioning      Observation/Other Assessments   Other Surveys  Activities of Balance and Confidence Scale;Dizziness Handicap Inventory [facility])    Activities of Balance Confidence Scale (ABC Scale)  33.125%    Dizziness Handicap Inventory (DHI)  48/100                   VESTIBULAR  AND BALANCE EVALUATION   HISTORY:  Subjective history of current problem: Pt reports that about 3 weeks ago she was laying on the floor performing her leg exercises when she started having a spinning sensation. Symptoms lasted for a few seconds. Vertigo occurred one additional time but pt is unable to recall the circumstances. She saw her doctor who referred her to vestibular therapy. She reports some constant low level dizziness as well. Pt is known to this clinic as she has been previously undergone vestibular therapy. Pt with prior history of 59% R UVH, bilateral BPPV which has been worse in the past on the L side compared to the right, and R superior cerebellar and left lateral occipital infarcts from 2018. Pt has a history of spinal stenosis with low back pain and bilateral hip pain. She was recently treated for this issue with a steroid taper by her MD. She was also having R wrist swelling/pain and was referred to an orthopedist who told her it was arthritis. Pt states that it has improved and she is not concerned about her wrist at this time. She also reports that she has seen another physical therapist for her back and hip pain with extensive treatment but no real help. She is not interested in pursuing additional treatment for this issue at this time either. Pt has a history of moderate cervical spinal canal narrowing at C4-5 and C5-6 due to disc osteophyte.and severe right neuroforaminal narrowing at C5-6. She also has a history of anxiety and panic attacks. She has seen  [facility] neurology for her constant dizziness in the past which was determined to be multifactorial  Related to old cerebellar stroke, microvascular ischemic changes, and inner ear issues. Description of dizziness: (vertigo, unsteadiness, lightheadedness, falling, general unsteadiness, whoozy, swimmy-headed sensation, aural fullness) vertigo Frequency: only two significant episodes Duration: Seconds Symptom nature: (motion provoked, positional, spontaneous, constant, variable, intermittent) motion provoked  Provocative Factors: Looking up, laying down Easing Factors: Waiting for symptoms to pass  Progression of symptoms: (better, worse, no change since onset) Better History of similar episodes: Yes, previously treated for BPPV and imbalance at this facility  Falls (yes/no): yes Number of falls in past 6 months: 1  Prior Functional Level: Mod I for ambulation with three wheeled walker and modI for transfers. Independent with IADLs  Auditory complaints (tinnitus, pain, drainage, hearing loss, aural fullness): itching, no pain/tinnitus/drainage. Pt now wearing bilateral hearing aids Vision (diplopia, visual field loss, recent changes, last eye exam): Pt reports gradual decline of vision. Hx of cataracts. No corrective lenses  Red Flags: (dysarthria, dysphagia, drop attacks, bowel and bladder changes, recent weight loss/gain) Review of systems negative for red flags.     EXAMINATION  POSTURE:  Thoracic kyphosis  NEUROLOGICAL SCREEN: (2+ unless otherwise noted.) N=normal  Ab=abnormal  Level Dermatome R L Myotome R L Reflex R L  C3 Anterior Neck N N Sidebend C2-3 N N Jaw CN V    C4 Top of Shoulder N N Shoulder Shrug C4 N N Hoffman's UMN    C5 Lateral Upper Arm N N Shoulder ABD C4-5 N N Biceps C5-6    C6 Lateral Arm/ Thumb N N Arm Flex/ Wrist Ext C5-6 N N Brachiorad. C5-6    C7 Middle Finger N N Arm Ext//Wrist Flex C6-7 N N Triceps C7    C8 4th & 5th Finger N N Flex/ Ext Carpi  Ulnaris C8 N N Patellar (L3-4)    T1 Medial Arm N N Interossei T1 N  N Gastrocnemius    L2 Medial thigh/groin N N Illiopsoas (L2-3) N N     L3 Lower thigh/med.knee N N Quadriceps (L3-4) N N     L4 Medial leg/lat thigh N N Tibialis Ant (L4-5) N N     L5 Lat. leg & dorsal foot N N EHL (L5) N N     S1 post/lat foot/thigh/leg N N Gastrocnemius (S1-2) N N     S2 Post./med. thigh & leg N N Hamstrings (L4-S3) N N       Cranial Nerves Visual acuity and visual fields are intact  Extraocular muscles are intact  Facial sensation is intact bilaterally  Facial strength is intact bilaterally  Hearing is mild to moderately diminished as tested by gross conversation Palate elevates midline, normal phonation  Shoulder shrug strength is intact  Tongue protrudes midline    SOMATOSENSORY:         Sensation           Intact      Diminished         Absent  Light touch Normal      COORDINATION: Finger to Nose: Mild dysmetria bilaterally (pt with Hx of cerebellar CVA) Heel to Shin: Normal Pronator Drift: Negative, but notable truncal instability with eyes closed Rapid Alternating Movements: Normal Finger to Thumb Opposition: Normal  MUSCULOSKELETAL SCREEN: Cervical Spine ROM: WFL and painless in all planes. No gross deficits identified   ROM: WNL   MMT: General deconditioning but no focal weakness.   Functional Mobility: ModI for transfers and ambulation with three wheeled walker   POSTURAL CONTROL TESTS:   Clinical Test of Sensory Interaction for Balance    (CTSIB): Deferred    OCULOMOTOR / VESTIBULAR TESTING:  Oculomotor Exam- Room Light  Findings Comments  Ocular Alignment normal   Ocular ROM normal   Spontaneous Nystagmus normal   Gaze-Holding Nystagmus abnormal R horizontal beating nystagmus at R mid-gaze  End-Gaze Nystagmus abnormal See above  Vergence (normal 2-3") not examined   Smooth Pursuit abnormal Saccadic  Cross-Cover Test not examined   Saccades abnormal Multiple  corrective saccades required to hit target  VOR Cancellation abnormal Significant dizziness reported  Left Head Impulse abnormal Corrective saccade  Right Head Impulse abnormal Corrective saccade  Static Acuity not examined   Dynamic Acuity not examined     Oculomotor Exam- Fixation Suppressed: Deferred  BPPV TESTS:  Symptoms Duration Intensity Nystagmus  L Dix-Hallpike Vertigo 8-10s 6/10 Upbeating L torsional  R Dix-Hallpike None   None  L Head Roll None   None  R Head Roll None   None  L Sidelying Test      R Sidelying Test        FUNCTIONAL OUTCOME MEASURES   Results Comments  ABC Scale 33.125% Low balance confidence  DHI 48/100 Moderate perception of disability  FOTO             Objective measurements completed on examination: See above findings.      Canalith Repositioning Treatment Pt treated with 1 bout of Epley Maneuver for presumed L posterior canal BPPV. 1.5 minute holds in each position. After first maneuver pt refuses any further treatments today due to concern about her ability to safely ambulate back to the car and return home.        PT Education - 01/30/20 1024    Education Details Plan of care    Person(s) Educated Patient    Methods Explanation    Comprehension Verbalized understanding  PT Short Term Goals - 01/30/20 0901      PT SHORT TERM GOAL #1   Title Pt will be independent with HEP in order to decrease dizziness to decrease fall risk and improve function at home.    Time 4    Period Weeks    Status New    Target Date 02/27/20             PT Long Term Goals - 01/30/20 0950      PT LONG TERM GOAL #1   Title Pt will report no further episodes of vertigo in order to ensure safety when transitioning in and out of bed and when bending over while performing household responsibilities.    Baseline 01/30/20: Pt has had 2 episodes of vertigo over the last 2 weeks but has been modifying her activities to avoid  triggering symptoms    Time 8    Period Weeks    Status New    Target Date 03/26/20      PT LONG TERM GOAL #2   Title Pt will improve ABC by at least 13% in order to demonstrate clinically significant improvement in balance confidence.    Baseline 01/30/20: 33.125%    Time 8    Period Weeks    Status New    Target Date 03/26/20      PT LONG TERM GOAL #3   Title Pt will decrease DHI score by at least 18 points in order to demonstrate clinically significant reduction in disability.    Baseline 01/30/20: 48/100    Time 8    Period Weeks    Status New    Target Date 03/26/20      PT LONG TERM GOAL #4   Title Pt will improve FOTO to at least 67 in order to demonstrate clinically significant improvement in function related to dizziness.    Baseline 01/30/20: 52    Time 8    Period Weeks    Status New    Target Date 03/26/20                  Plan - 01/30/20 0858    Clinical Impression Statement Pt is a pleasant 86 year-old female referred for dizziness.  She is well-known to this clinic as she has previously been treated for BPPV as well as participated in vestibular therapy for generalized dizziness.  Patient returns with 3-week history of recurrent onset vertigo.  She has a history that is significant for unilateral vestibular hypofunction, BPPV, and cerebellar CVA.  Positive left Dix-Hallpike test today consistent with left posterior canal BPPV.  Patient treated with 1 bout of Epley maneuver but pt defers further intervention during evaluation because she is concerned that if it makes her too dizzy she will be unable to safely drive home.  She will return next week for additional treatment and once BPPV clear therapist will assess for additional balance deficits and determine if she needs additional therapy.  Pt presents with dizziness and will benefit from skilled PT services to decrease symptoms and decrease her risk for future falls.    Personal Factors and Comorbidities  Comorbidity 3+    Examination-Activity Limitations Bed Mobility;Bend;Locomotion Level;Reach Overhead;Transfers    Examination-Participation Restrictions Cleaning;Community Activity;Laundry;Shop    Stability/Clinical Decision Making Unstable/Unpredictable    Clinical Decision Making Moderate    Rehab Potential Good    PT Frequency 1x / week    PT Duration 8 weeks    PT Treatment/Interventions ADLs/Self Care Home  Management;Aquatic Therapy;Canalith Repostioning;Cryotherapy;Electrical Stimulation;Iontophoresis 4mg /ml Dexamethasone;Moist Heat;Ultrasound;DME Instruction;Gait training;Stair training;Functional mobility training;Therapeutic activities;Therapeutic exercise;Balance training;Neuromuscular re-education;Cognitive remediation;Patient/family education;Manual techniques;Passive range of motion;Dry needling;Vestibular    PT Next Visit Plan Repeat Dix-Hallpike and treat as appropriate, once BPPV clear assess balance if pt desires    PT Home Exercise Plan None currently    Consulted and Agree with Plan of Care Patient           Patient will benefit from skilled therapeutic intervention in order to improve the following deficits and impairments:  Decreased balance, Dizziness, Decreased strength  Visit Diagnosis: Dizziness and giddiness  BPPV (benign paroxysmal positional vertigo), left     Problem List Patient Active Problem List   Diagnosis Date Noted  . Lymphedema 03/09/2019  . Swelling of limb 02/28/2019  . Cellulitis of left leg 12/31/2018  . Hip pain, bilateral 12/31/2018  . Cellulitis 12/27/2018  . Carotid stenosis 12/28/2017  . Vertigo 11/30/2017  . Imbalance due to old stroke 11/03/2016  . Gait instability 10/19/2016  . Ataxia 08/30/2016  . History of stroke 08/30/2016  . Dizziness 11/03/2015  . Abnormal PET scan of colon 06/18/2014  . H/O seasonal allergies 06/04/2014  . Lung nodule, solitary 06/04/2014  . 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  . Hypertension  12/04/2011  . Hyperlipidemia 12/04/2011  . Syncope 12/04/2011  . Diabetes mellitus ([place]) 12/04/2011  . Smoking hx 12/04/2011   Phillips Grout PT, DPT, GCS  Roshunda Keir 01/30/2020, 10:26 AM  [place] MAIN [facility] SERVICES [place], [place], [address] Phone: [phone]   Fax:  [phone]  Name: Claire Washington MRN: [id number] Date of Birth: [birth year]

## 2020-01-31 IMAGING — MR MR HEAD W/O CM
9 of 10 series · 41 of 48 positions shown · non-contrast
Comparison: Head CT without contrast 11/22/2017. Brain MRI
10/20/2016.

CLINICAL DATA: [AGE] female with persistent
dizziness/vertigo.

EXAM:
MRI HEAD WITHOUT CONTRAST
TECHNIQUE: Multiplanar, multiecho pulse sequences of the brain and surrounding
structures were obtained without intravenous contrast.

[Series 2: GRE · sagittal · 5.0mm · 0.45mm/px · 3 of 25 slices shown (1 of 2)]
[im 1/25]
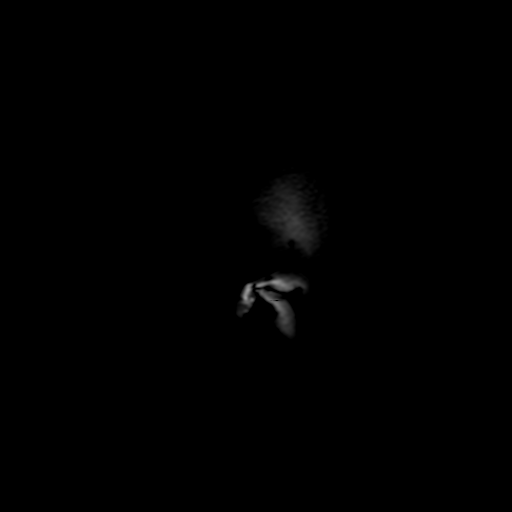
[im 13/25]
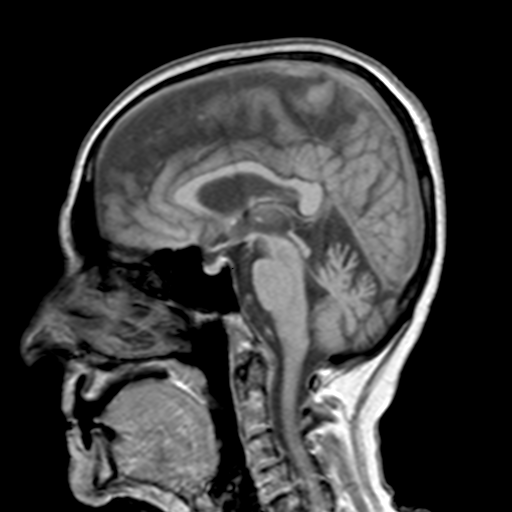
[im 25/25]
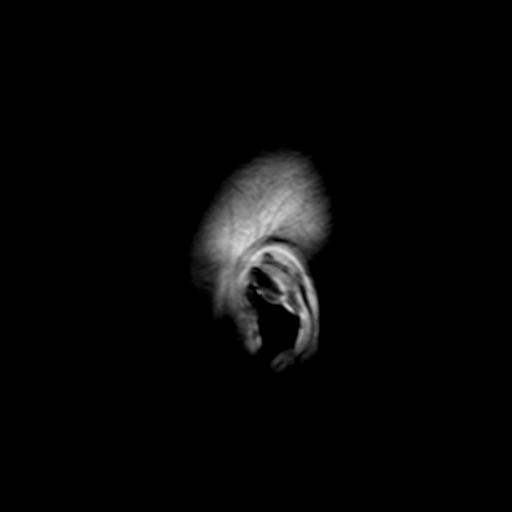

[Series 4: DWI · axial · 3.0mm · 1.80mm/px · z∈[-78,+84]mm · 7 of 55 slices shown (1 of 2)]
[im 1/55]
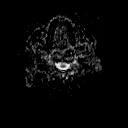
[im 10/55]
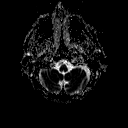
[im 19/55]
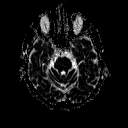
[im 28/55]
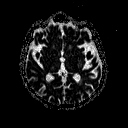
[im 37/55]
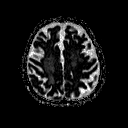
[im 46/55]
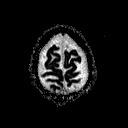
[im 55/55]
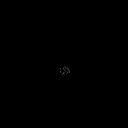

[Series 6: DWI · coronal · 3.0mm · 1.80mm/px · 6 of 47 slices shown (2 of 2)]
[im 1/47]
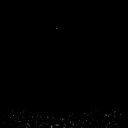
[im 10/47]
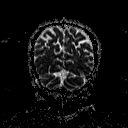
[im 19/47]
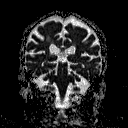
[im 28/47]
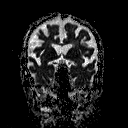
[im 37/47]
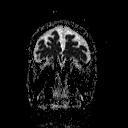
[im 47/47]
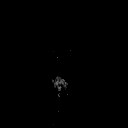

[Series 7: T2 · axial · 5.0mm · 0.45mm/px · z∈[-74,+82]mm · 3 of 25 slices shown (1 of 3)]
[im 1/25]
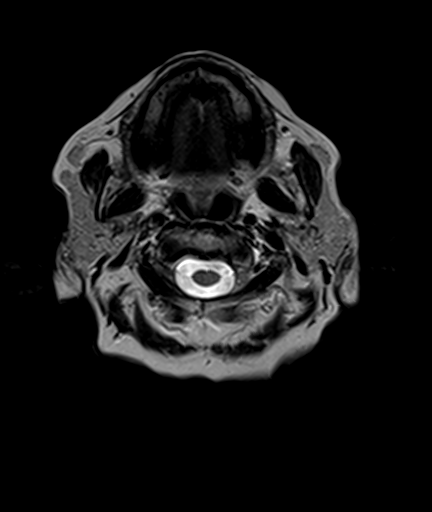
[im 13/25]
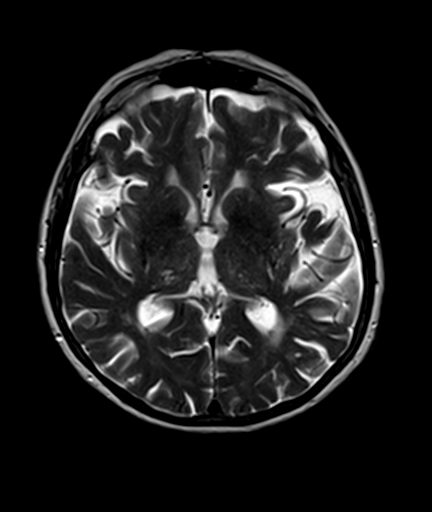
[im 25/25]
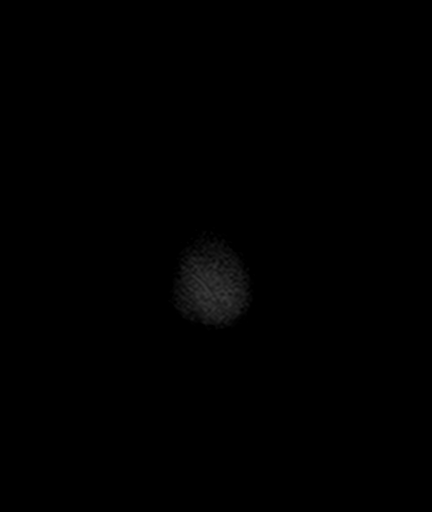

[Series 8: FLAIR · axial · 3.0mm · 0.45mm/px · z∈[-74,+82]mm · 7 of 53 slices shown]
[im 1/53]
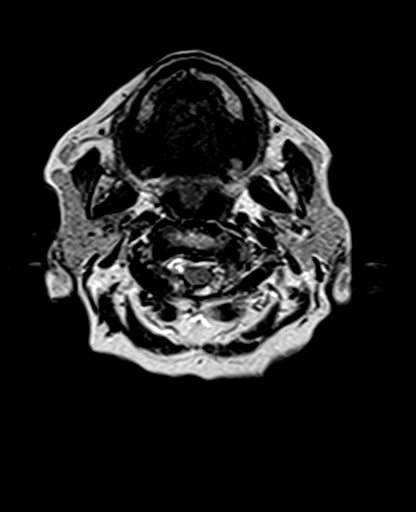
[im 9/53]
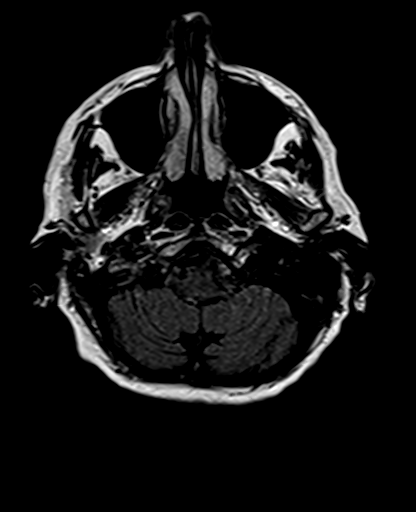
[im 18/53]
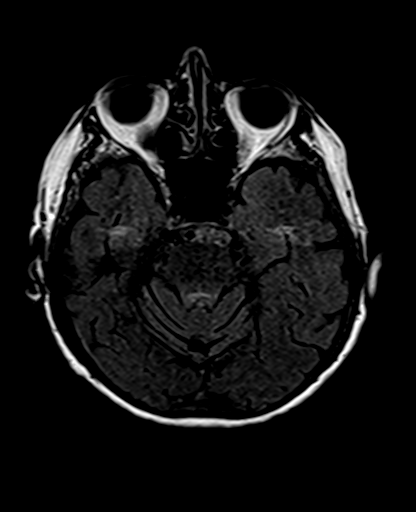
[im 27/53]
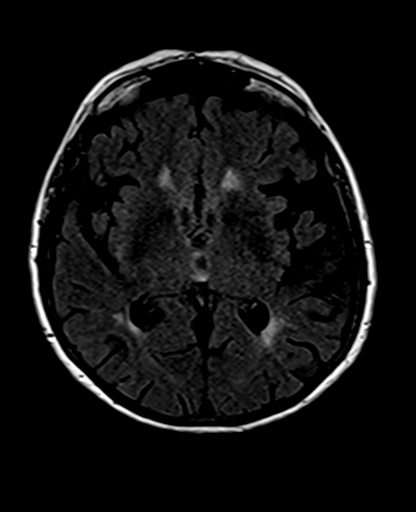
[im 35/53]
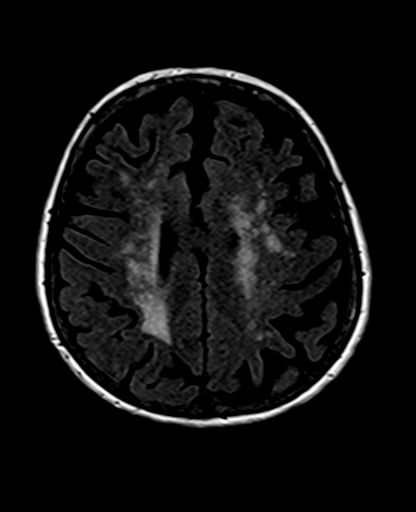
[im 44/53]
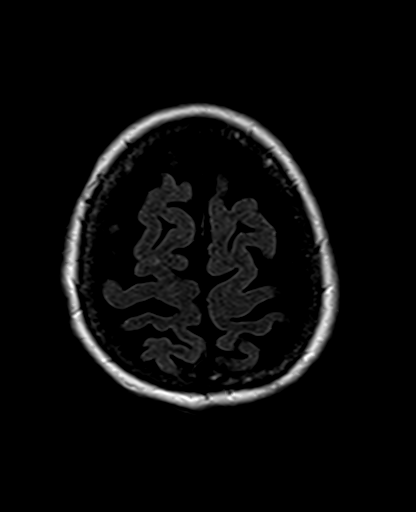
[im 53/53]
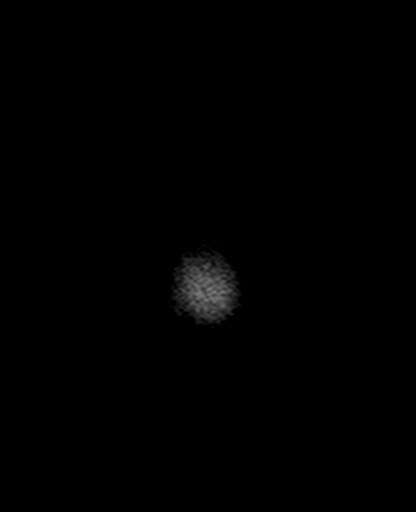

[Series 9: T2 · axial · 5.0mm · 1.20mm/px · z∈[-67,+82]mm · 3 of 24 slices shown (2 of 3)]
[im 1/24]
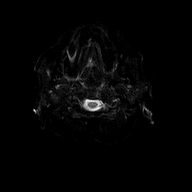
[im 12/24]
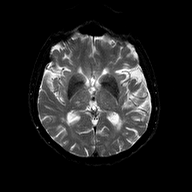
[im 24/24]
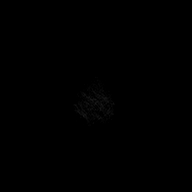

[Series 10: GRE · axial · 5.0mm · 0.45mm/px · z∈[-74,+82]mm · 3 of 25 slices shown (2 of 2)]
[im 1/25]
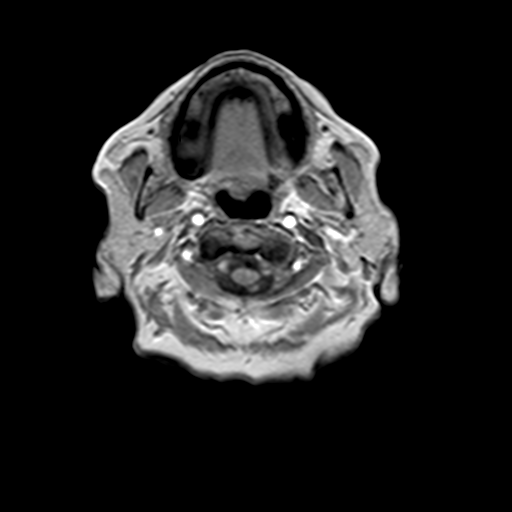
[im 13/25]
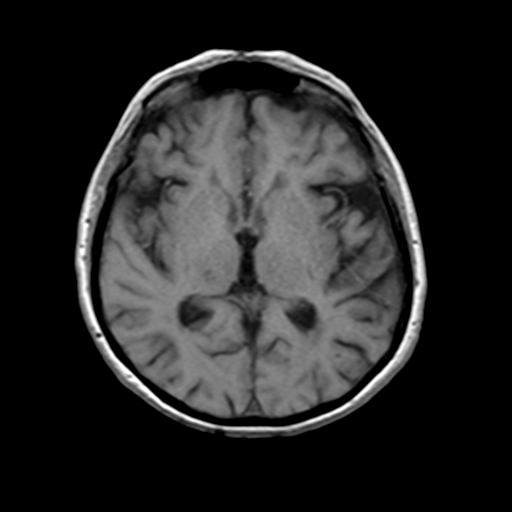
[im 25/25]
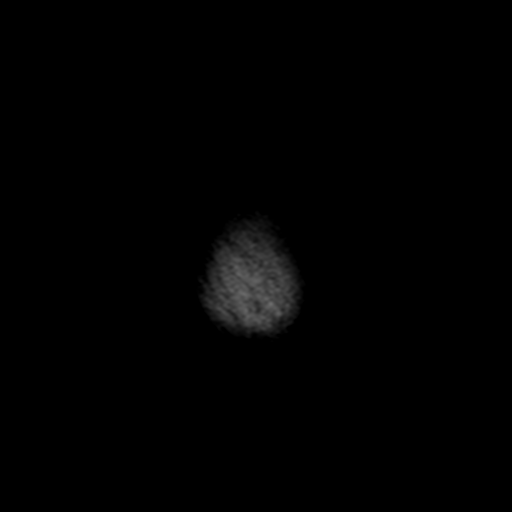

[Series 11: T2 · coronal · 5.0mm · 0.43mm/px · 3 of 27 slices shown (3 of 3)]
[im 1/27]
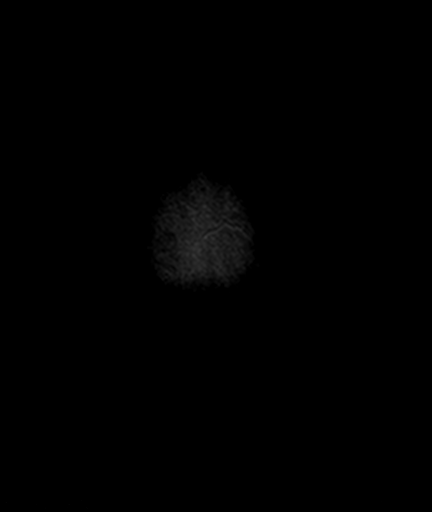
[im 14/27]
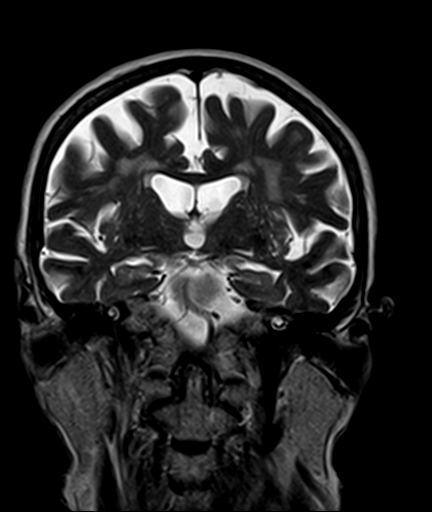
[im 27/27]
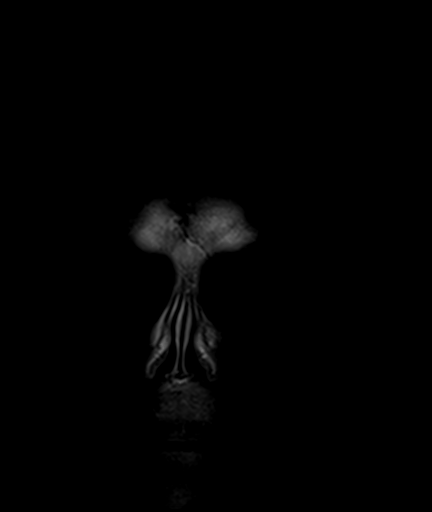

[Series 100: ax (id) · axial · 3.0mm · 1.80mm/px · z∈[-78,+57]mm · 6 of 55 slices shown]
[im 1/55]
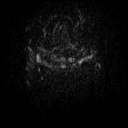
[im 10/55]
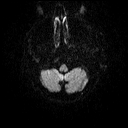
[im 19/55]
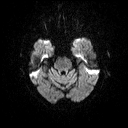
[im 28/55]
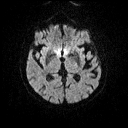
[im 37/55]
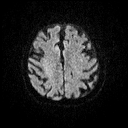
[im 46/55]
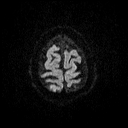

[41 of 48 positions shown; findings below may reference images not displayed]

FINDINGS: Brain: No restricted diffusion to suggest acute infarction. No
midline shift, mass effect, evidence of mass lesion,
ventriculomegaly, extra-axial collection or acute intracranial
hemorrhage. Cervicomedullary junction and pituitary are within
normal limits.

Stable cerebral volume since 5541. Patchy and confluent chronic
cerebral white matter T2 and FLAIR hyperintensity is stable. No
cortical encephalomalacia. No chronic cerebral blood products.
Brainstem and cerebellum remain normal for age. Stable mild for age
T2 heterogeneity in the deep gray matter, which mostly resembles
perivascular spaces (normal variant).

Vascular: Major intracranial vascular flow voids are stable since
5541.

Skull and upper cervical spine: Negative for age visible cervical
spine. Visualized bone marrow signal is within normal limits.

Sinuses/Orbits: Stable and negative.

Other: Mastoid air cells remain clear. Visible internal auditory
structures appear normal. Scalp and face soft tissues appear
negative.
IMPRESSION: 1.  No acute intracranial abnormality.
2. Stable noncontrast MRI appearance of the brain, with no evidence
of significant prior posterior circulation ischemia.

## 2020-02-06 ENCOUNTER — Ambulatory Visit: Payer: Medicare Other

## 2020-02-06 ENCOUNTER — Other Ambulatory Visit: Payer: Self-pay

## 2020-02-06 DIAGNOSIS — R42 Dizziness and giddiness: Secondary | ICD-10-CM | POA: Diagnosis not present

## 2020-02-06 DIAGNOSIS — H8112 Benign paroxysmal vertigo, left ear: Secondary | ICD-10-CM

## 2020-02-06 NOTE — Therapy (Signed)
[place] MAIN [facility] SERVICES [address] [place], [place], [address] Phone: [phone]   Fax:  [phone]  Physical Therapy Treatment  Patient Details  Name: Claire Washington MRN: [id number] Date of Birth: 1924-11-03 Referring Provider (PT): Dr. Ginette Pitman   Encounter Date: 02/06/2020   PT End of Session - 02/06/20 0811    Visit Number 2    Number of Visits 9    Date for PT Re-Evaluation 03/26/20    Authorization Type eval: 01/30/20    PT Start Time 0808    PT Stop Time 0845    PT Time Calculation (min) 37 min    Activity Tolerance Patient tolerated treatment well    Behavior During Therapy [facility] for tasks assessed/performed           Past Medical History:  Diagnosis Date  . Arthritis   . Basal cell carcinoma    basel cell  . Chronic cystitis   . Diabetes mellitus type 2, controlled, without complications ([place])    type 2 or unspecified type diabetes mellitus without mention of complication, not stated as uncontrolled  . Disease of thyroid gland   . Embolic stroke ([place])    a. 07/2016 MRI Brain: small right superior cerebellum and left lateral occipital cortex infarcts;  b. 08/2016 Echo: EF 60-65%, Gr1 DD, mild MR;  c. 08/2016 30 Day Event Monitor: No significant tachy/bradyarrhythmias. No afib.  . Follicular neoplasm of thyroid   . Hemorrhage of rectum and anus   . Hyperlipidemia   . Hyperlipidemia, unspecified   . Hypertension   . Leg pain   . Osteoarthritis   . Pernicious anemia   . Small vessel disease ([place])   . Spinal stenosis, lumbar region without neurogenic claudication   . Subdural hematoma ([place])    a. 2013 in setting of syncope.  . Syncope and collapse    a. 2013 - syncope and fall w/ resultant subdural hematoma; b. prev nl echo and stress test @ [facility].  Marland Kitchen Thyroid nodule   . Vertigo & Chronic Dizziness    a. Previously seen by ENT with Physical Therapy.  MRI 2000 small vessel disease    Past Surgical  History:  Procedure Laterality Date  . BILATERAL SALPINGOOPHORECTOMY    . BREAST CYST EXCISION     ? side, yrs ago  . COLONOSCOPY  01/26/2009   Diverticulosis  . CYSTOSCOPY    . HEMORRHOIDECTOMY BY SIMPLE LIGATION    . PR COLONOSCOPY FLX W/ENDOSCOPIC MUCOSAL RESECTION  09/02/2014   Procedure: COLONOSCOPY, FLEXIBLE; WITH ENDOSCOPIC MUCOSAL RESECTION; Surgeon: Saunders Revel, MD; Location: [facility]; Service: Gastroenterology  . PR COLSC FLX W/RMVL OF TUMOR POLYP LESION SNARE TQ  09/02/2014   Procedure: COLONOSCOPY FLEX; W/REMOV TUMOR/LES BY SNARE; Surgeon: Saunders Revel, MD; Location: GI PROC[facility]URES [facility]; Service: Gastroenterology  . SKIN SURGERY    . TOTAL ABDOMINAL HYSTERECTOMY W/ BILATERAL SALPINGOOPHORECTOMY  1970s    There were no vitals filed for this visit.   Subjective Assessment - 02/06/20 0810    Subjective Pt reports that she had a busy week this week and didn't really pay a lot of attention to her dizziness. She had a little bit of vertigo this week but not much. Pt got her COVID booster yesterday without issue. No specific questions or concerns at this time.    Pertinent History Pt reports that about 3 weeks ago she was laying on the floor performing her leg exercises when  she started having a spinning sensation. Symptoms lasted for a few seconds. Vertigo occurred one additional time but pt is unable to recall the circumstances. She saw her doctor who referred her to vestibular therapy. She reports some constant low level dizziness as well. Pt is known to this clinic as she has been previously undergone vestibular therapy. Pt with prior history of 59% R UVH, bilateral BPPV which has been worse in the past on the L side compared to the right, and R superior cerebellar and left lateral occipital infarcts from 2018. Pt has a history of spinal stenosis with low back pain and bilateral hip pain. She was recently treated for this issue with a steroid taper by  her MD. She was also having R wrist swelling/pain and was referred to an orthopedist who told her it was arthritis. Pt states that it has improved and she is not concerned about her wrist at this time. She also reports that she has seen another physical therapist for her back and hip pain with extensive treatment but no real help. She is not interested in pursuing additional treatment for this issue at this time either. Pt has a history of moderate cervical spinal canal narrowing at C4-5 and C5-6 due to disc osteophyte.and severe right neuroforaminal narrowing at C5-6. She also has a history of anxiety and panic attacks. She has seen [facility] neurology for her constant dizziness in the past which was determined to be multifactorial  Related to old cerebellar stroke, microvascular ischemic changes, and inner ear issues.    Limitations Walking    Diagnostic tests 11/30/17: MRI Brain without contrast: No acute intracranial abnormality. Stable non contrast MRI appearance of the brain, with no evidence of significant prior posterior circulation ischemia.    Patient Stated Goals Improve strength, balance, and dizziness    Currently in Pain? Other (Comment)   Unrelated to current episode            TREATMENT    Canalith Repositioning Treatment On inverted mat table performed Dix-Hallpike and Roll tests. L Dix-Hallpike is positive for vertigo however only downbeats of nystagmus observed. R Dix-Hallpike is negative for vertigo and again only downbeating nystagmus observed. L roll test is positive for upbeating L torsional nystagmus of brief duration however minimal vertigo. R roll test is negative for vertigo and she has some downbeating nystagmus. Pt treated with 3 bouts of Epley Maneuver for presumed L posterior canal BPPV. 2 minute holds in each position. Retesting performed between maneuvers. After both first and second maneuvers retesting is negative for either vertigo or nystagmus (no downbeating  observed either).     Pt continues to have signs and symptoms that suggest L posterior canal BPPV. Pt treated again today with Epley maneuver and more bouts were able to be completed today compared to initial evaluation. Will continue to test and treat as indicated until BPPV is clear and then assess balance to determine if further intervention is necessary. Pt will benefit from PT services to address deficits in dizziness, balance, and mobility in order to return to full function at home and decrease her risk for falls.                           PT Short Term Goals - 01/30/20 0901      PT SHORT TERM GOAL #1   Title Pt will be independent with HEP in order to decrease dizziness to decrease fall risk and improve  function at home.    Time 4    Period Weeks    Status New    Target Date 02/27/20             PT Long Term Goals - 01/30/20 0950      PT LONG TERM GOAL #1   Title Pt will report no further episodes of vertigo in order to ensure safety when transitioning in and out of bed and when bending over while performing household responsibilities.    Baseline 01/30/20: Pt has had 2 episodes of vertigo over the last 2 weeks but has been modifying her activities to avoid triggering symptoms    Time 8    Period Weeks    Status New    Target Date 03/26/20      PT LONG TERM GOAL #2   Title Pt will improve ABC by at least 13% in order to demonstrate clinically significant improvement in balance confidence.    Baseline 01/30/20: 33.125%    Time 8    Period Weeks    Status New    Target Date 03/26/20      PT LONG TERM GOAL #3   Title Pt will decrease DHI score by at least 18 points in order to demonstrate clinically significant reduction in disability.    Baseline 01/30/20: 48/100    Time 8    Period Weeks    Status New    Target Date 03/26/20      PT LONG TERM GOAL #4   Title Pt will improve FOTO to at least 67 in order to demonstrate clinically significant  improvement in function related to dizziness.    Baseline 01/30/20: 52    Time 8    Period Weeks    Status New    Target Date 03/26/20                 Plan - 02/06/20 9604    Clinical Impression Statement Pt continues to have signs and symptoms that suggest L posterior canal BPPV. Pt treated again today with Epley maneuver however more bouts were able to be completed today. Will continue to test and treat as indicated until BPPV is clear and then assess balance to determine if further intervention is necessary. Pt will benefit from PT services to address deficits in dizziness, balance, and mobility in order to return to full function at home and decrease her risk for falls.    Personal Factors and Comorbidities Comorbidity 3+    Examination-Activity Limitations Bed Mobility;Bend;Locomotion Level;Reach Overhead;Transfers    Examination-Participation Restrictions Cleaning;Community Activity;Laundry;Shop    Stability/Clinical Decision Making Unstable/Unpredictable    Rehab Potential Good    PT Frequency 1x / week    PT Duration 8 weeks    PT Treatment/Interventions ADLs/Self Care Home Management;Aquatic Therapy;Canalith Repostioning;Cryotherapy;Electrical Stimulation;Iontophoresis 4mg /ml Dexamethasone;Moist Heat;Ultrasound;DME Instruction;Gait training;Stair training;Functional mobility training;Therapeutic activities;Therapeutic exercise;Balance training;Neuromuscular re-education;Cognitive remediation;Patient/family education;Manual techniques;Passive range of motion;Dry needling;Vestibular    PT Next Visit Plan Repeat Dix-Hallpike and treat as appropriate, once BPPV clear assess balance if pt desires    PT Home Exercise Plan None currently    Consulted and Agree with Plan of Care Patient           Patient will benefit from skilled therapeutic intervention in order to improve the following deficits and impairments:  Decreased balance, Dizziness, Decreased strength  Visit  Diagnosis: Dizziness and giddiness  BPPV (benign paroxysmal positional vertigo), left     Problem List Patient Active Problem List  Diagnosis Date Noted  . Lymphedema 03/09/2019  . Swelling of limb 02/28/2019  . Cellulitis of left leg 12/31/2018  . Hip pain, bilateral 12/31/2018  . Cellulitis 12/27/2018  . Carotid stenosis 12/28/2017  . Vertigo 11/30/2017  . Imbalance due to old stroke 11/03/2016  . Gait instability 10/19/2016  . Ataxia 08/30/2016  . History of stroke 08/30/2016  . Dizziness 11/03/2015  . Abnormal PET scan of colon 06/18/2014  . H/O seasonal allergies 06/04/2014  . Lung nodule, solitary 06/04/2014  . 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  . Hypertension 12/04/2011  . Hyperlipidemia 12/04/2011  . Syncope 12/04/2011  . Diabetes mellitus ([place]) 12/04/2011  . Smoking hx 12/04/2011   Phillips Grout PT, DPT, GCS  Rece Zechman 02/06/2020, 8:48 AM  [place] MAIN [facility] SERVICES [address] [place], [place], [address] Phone: [phone]   Fax:  [phone]  Name: Claire Washington MRN: [id number] Date of Birth: 1924/10/10

## 2020-02-10 ENCOUNTER — Other Ambulatory Visit: Payer: Self-pay | Admitting: Cardiovascular Disease

## 2020-02-13 ENCOUNTER — Other Ambulatory Visit: Payer: Self-pay

## 2020-02-13 ENCOUNTER — Ambulatory Visit: Payer: Medicare Other

## 2020-02-13 DIAGNOSIS — H8112 Benign paroxysmal vertigo, left ear: Secondary | ICD-10-CM

## 2020-02-13 DIAGNOSIS — R42 Dizziness and giddiness: Secondary | ICD-10-CM

## 2020-02-13 NOTE — Therapy (Signed)
[place] MAIN [facility] SERVICES [address] [place], [place], [address] Phone: [phone]   Fax:  [phone]  Physical Therapy Treatment  Patient Details  Name: Claire Washington MRN: [id number] Date of Birth: [birth year] Referring Provider (PT): Dr. Ginette Pitman   Encounter Date: 02/13/2020   PT End of Session - 02/13/20 0810    Visit Number 3    Number of Visits 9    Date for PT Re-Evaluation 03/26/20    Authorization Type eval: 01/30/20    PT Start Time 0810    PT Stop Time 0843    PT Time Calculation (min) 33 min    Activity Tolerance Patient tolerated treatment well    Behavior During Therapy [facility] for tasks assessed/performed           Past Medical History:  Diagnosis Date   Arthritis    Basal cell carcinoma    basel cell   Chronic cystitis    Diabetes mellitus type 2, controlled, without complications ([place])    type 2 or unspecified type diabetes mellitus without mention of complication, not stated as uncontrolled   Disease of thyroid gland    Embolic stroke ([place])    a. 07/2016 MRI Brain: small right superior cerebellum and left lateral occipital cortex infarcts;  b. 08/2016 Echo: EF 60-65%, Gr1 DD, mild MR;  c. 08/2016 30 Day Event Monitor: No significant tachy/bradyarrhythmias. No afib.   Follicular neoplasm of thyroid    Hemorrhage of rectum and anus    Hyperlipidemia    Hyperlipidemia, unspecified    Hypertension    Leg pain    Osteoarthritis    Pernicious anemia    Small vessel disease ([place])    Spinal stenosis, lumbar region without neurogenic claudication    Subdural hematoma ([place])    a. 2013 in setting of syncope.   Syncope and collapse    a. 2013 - syncope and fall w/ resultant subdural hematoma; b. prev nl echo and stress test @ [facility].   Thyroid nodule    Vertigo & Chronic Dizziness    a. Previously seen by ENT with Physical Therapy.  MRI 2000 small vessel disease    Past Surgical  History:  Procedure Laterality Date   BILATERAL SALPINGOOPHORECTOMY     BREAST CYST EXCISION     ? side, yrs ago   COLONOSCOPY  01/26/2009   Diverticulosis   CYSTOSCOPY     HEMORRHOIDECTOMY BY SIMPLE LIGATION     PR COLONOSCOPY FLX W/ENDOSCOPIC MUCOSAL RESECTION  09/02/2014   Procedure: COLONOSCOPY, FLEXIBLE; WITH ENDOSCOPIC MUCOSAL RESECTION; Surgeon: Saunders Revel, MD; Location: [facility]; Service: Gastroenterology   PR [place] FLX W/RMVL OF TUMOR POLYP LESION SNARE TQ  09/02/2014   Procedure: COLONOSCOPY FLEX; W/REMOV TUMOR/LES BY SNARE; Surgeon: Saunders Revel, MD; Location: [facility]; Service: Gastroenterology   SKIN SURGERY     TOTAL ABDOMINAL HYSTERECTOMY W/ BILATERAL SALPINGOOPHORECTOMY  1970s    There were no vitals filed for this visit.   Subjective Assessment - 02/13/20 0807    Subjective Pt reports that she had two mornings with very mild dizziness when getting up out of bed. Otherwise she has not had dizziness. She reports significant improvement in the symptoms since starting therapy. No specific questions or concerns at this time.    Pertinent History Pt reports that about 3 weeks ago she was laying on the floor performing her leg exercises when she started having a spinning sensation. Symptoms lasted  for a few seconds. Vertigo occurred one additional time but pt is unable to recall the circumstances. She saw her doctor who referred her to vestibular therapy. She reports some constant low level dizziness as well. Pt is known to this clinic as she has been previously undergone vestibular therapy. Pt with prior history of 59% R UVH, bilateral BPPV which has been worse in the past on the L side compared to the right, and R superior cerebellar and left lateral occipital infarcts from 2018. Pt has a history of spinal stenosis with low back pain and bilateral hip pain. She was recently treated for this issue with a steroid taper by her MD.  She was also having R wrist swelling/pain and was referred to an orthopedist who told her it was arthritis. Pt states that it has improved and she is not concerned about her wrist at this time. She also reports that she has seen another physical therapist for her back and hip pain with extensive treatment but no real help. She is not interested in pursuing additional treatment for this issue at this time either. Pt has a history of moderate cervical spinal canal narrowing at C4-5 and C5-6 due to disc osteophyte.and severe right neuroforaminal narrowing at C5-6. She also has a history of anxiety and panic attacks. She has seen [facility] neurology for her constant dizziness in the past which was determined to be multifactorial  Related to old cerebellar stroke, microvascular ischemic changes, and inner ear issues.    Limitations Walking    Diagnostic tests 11/30/17: MRI Brain without contrast: No acute intracranial abnormality. Stable non contrast MRI appearance of the brain, with no evidence of significant prior posterior circulation ischemia.    Patient Stated Goals Improve strength, balance, and dizziness    Currently in Pain? Other (Comment)   Unrelated to current episode             [facility] PT Assessment - 02/13/20 0826      Berg Balance Test   Sit to Stand Able to stand without using hands and stabilize independently    Standing Unsupported Unable to stand 30 seconds unassisted    Sitting with Back Unsupported but Feet Supported on Floor or Stool Able to sit safely and securely 2 minutes    Stand to Sit Sits safely with minimal use of hands    Transfers Able to transfer safely, definite need of hands    Standing Unsupported with Eyes Closed Needs help to keep from falling    Standing Unsupported with Feet Together Needs help to attain position and unable to hold for 15 seconds    From Standing, Reach Forward with Outstretched Arm Loses balance while trying/requires external support     From Standing Position, Pick up Object from Floor Unable to try/needs assist to keep balance    From Standing Position, Turn to Look Behind Over each Shoulder Needs assist to keep from losing balance and falling    Turn 360 Degrees Needs assistance while turning    Standing Unsupported, Alternately Place Feet on Step/Stool Needs assistance to keep from falling or unable to try    Standing Unsupported, One Foot in [employer] balance while stepping or standing    Standing on One Leg Tries to lift leg/unable to hold 3 seconds but remains standing independently    Total Score 16              TREATMENT    Neuromuscular Re-education  Dix-Hallpike and roll tests  performed with patient which are negative bilaterally for nystagmus and vertigo. Pt does still have her typical downbeating nystagmus. Performed outcome measures with patient.  BERG: 16/56 5TSTS: 10.6s TUG: 14.2s 74m gait speed: self selected: 12.3s = 0.81 m/s,  Fastest: 11.1s =  0.90 m/s VOR x 1 horizontal in sitting 30s x 2, pt reports dizziness.  HEP reviewed and modified with patient;    All BPPV tests are negative today. Performed outcome measures with patient and she does well with the 5TSTS, TUG, and 74m gait speed. However her BERG demonstrated very high fall risk at 16/56. Reviewed HEP and pt encouraged to continue VOR x 1 horizontal and vertical in sitting however encouraged pt to perform at faster speed for 30s. Also encouraged pt to perform feet apart balance without UE support in corner or next to counter/walker. Pt encouraged to follow-up as scheduled. Pt will benefit from PT services to address deficits in dizziness, balance, and mobility in order to return to full function at home and decrease her risk for falls.                            PT Short Term Goals - 01/30/20 0901      PT SHORT TERM GOAL #1   Title Pt will be independent with HEP in order to decrease dizziness to decrease fall  risk and improve function at home.    Time 4    Period Weeks    Status New    Target Date 02/27/20             PT Long Term Goals - 01/30/20 0950      PT LONG TERM GOAL #1   Title Pt will report no further episodes of vertigo in order to ensure safety when transitioning in and out of bed and when bending over while performing household responsibilities.    Baseline 01/30/20: Pt has had 2 episodes of vertigo over the last 2 weeks but has been modifying her activities to avoid triggering symptoms    Time 8    Period Weeks    Status New    Target Date 03/26/20      PT LONG TERM GOAL #2   Title Pt will improve ABC by at least 13% in order to demonstrate clinically significant improvement in balance confidence.    Baseline 01/30/20: 33.125%    Time 8    Period Weeks    Status New    Target Date 03/26/20      PT LONG TERM GOAL #3   Title Pt will decrease DHI score by at least 18 points in order to demonstrate clinically significant reduction in disability.    Baseline 01/30/20: 48/100    Time 8    Period Weeks    Status New    Target Date 03/26/20      PT LONG TERM GOAL #4   Title Pt will improve FOTO to at least 67 in order to demonstrate clinically significant improvement in function related to dizziness.    Baseline 01/30/20: 52    Time 8    Period Weeks    Status New    Target Date 03/26/20                 Plan - 02/13/20 0810    Clinical Impression Statement All BPPV tests are negative today. Performed outcome measures with patient and she does well with the 5TSTS, TUG, and 27m  gait speed. However her BERG demonstrated very high fall risk at 16/56. Reviewed HEP and pt encouraged to continue VOR x 1 horizontal and vertical in sitting however encouraged pt to perform at faster speed for 30s. Also encouraged pt to perform feet apart balance without UE support in corner or next to counter/walker. Pt encouraged to follow-up as scheduled. Pt will benefit from PT  services to address deficits in dizziness, balance, and mobility in order to return to full function at home and decrease her risk for falls.    Personal Factors and Comorbidities Comorbidity 3+    Examination-Activity Limitations Bed Mobility;Bend;Locomotion Level;Reach Overhead;Transfers    Examination-Participation Restrictions Cleaning;Community Activity;Laundry;Shop    Stability/Clinical Decision Making Unstable/Unpredictable    Rehab Potential Good    PT Frequency 1x / week    PT Duration 8 weeks    PT Treatment/Interventions ADLs/Self Care Home Management;Aquatic Therapy;Canalith Repostioning;Cryotherapy;Electrical Stimulation;Iontophoresis 4mg /ml Dexamethasone;Moist Heat;Ultrasound;DME Instruction;Gait training;Stair training;Functional mobility training;Therapeutic activities;Therapeutic exercise;Balance training;Neuromuscular re-education;Cognitive remediation;Patient/family education;Manual techniques;Passive range of motion;Dry needling;Vestibular    PT Next Visit Plan Repeat Dix-Hallpike and treat as appropriate, once BPPV clear assess balance if pt desires    PT Home Exercise Plan None currently    Consulted and Agree with Plan of Care Patient           Patient will benefit from skilled therapeutic intervention in order to improve the following deficits and impairments:  Decreased balance, Dizziness, Decreased strength  Visit Diagnosis: Dizziness and giddiness  BPPV (benign paroxysmal positional vertigo), left     Problem List Patient Active Problem List   Diagnosis Date Noted   Lymphedema 03/09/2019   Swelling of limb 02/28/2019   Cellulitis of left leg 12/31/2018   Hip pain, bilateral 12/31/2018   Cellulitis 12/27/2018   Carotid stenosis 12/28/2017   Vertigo 11/30/2017   Imbalance due to old stroke 11/03/2016   Gait instability 10/19/2016   Ataxia 08/30/2016   History of stroke 08/30/2016   Dizziness 11/03/2015   Abnormal PET scan of colon  06/18/2014   H/O seasonal allergies 06/04/2014   Lung nodule, solitary 63/78/5885   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   Hypertension 12/04/2011   Hyperlipidemia 12/04/2011   Syncope 12/04/2011   Diabetes mellitus ([place]) 12/04/2011   Smoking hx 12/04/2011   Phillips Grout PT, DPT, GCS  Alistar Mcenery 02/13/2020, 9:09 AM  [place] [address] [place], [place], [address] Phone: [phone]   Fax:  [phone]  Name: Claire Washington MRN: [id number] Date of Birth: 04/24/1924

## 2020-02-20 ENCOUNTER — Ambulatory Visit: Payer: Medicare Other | Attending: Internal Medicine

## 2020-02-20 ENCOUNTER — Other Ambulatory Visit: Payer: Self-pay

## 2020-02-20 DIAGNOSIS — H8112 Benign paroxysmal vertigo, left ear: Secondary | ICD-10-CM

## 2020-02-20 DIAGNOSIS — R42 Dizziness and giddiness: Secondary | ICD-10-CM | POA: Insufficient documentation

## 2020-02-20 NOTE — Therapy (Signed)
[place] MAIN [facility] SERVICES [address] [place], [place], [address] Phone: [phone]   Fax:  [phone]  Physical Therapy Treatment  Patient Details  Name: Claire Washington MRN: [id number] Date of Birth: May 08, 1924 Referring Provider (PT): Dr. Ginette Pitman   Encounter Date: 02/20/2020   PT End of Session - 02/20/20 0826    Visit Number 4    Number of Visits 9    Date for PT Re-Evaluation 03/26/20    Authorization Type eval: 01/30/20    PT Start Time 0810    PT Stop Time 0850    PT Time Calculation (min) 40 min    Activity Tolerance Patient tolerated treatment well    Behavior During Therapy [facility] for tasks assessed/performed           Past Medical History:  Diagnosis Date  . Arthritis   . Basal cell carcinoma    basel cell  . Chronic cystitis   . Diabetes mellitus type 2, controlled, without complications ([place])    type 2 or unspecified type diabetes mellitus without mention of complication, not stated as uncontrolled  . Disease of thyroid gland   . Embolic stroke ([place])    a. 07/2016 MRI Brain: small right superior cerebellum and left lateral occipital cortex infarcts;  b. 08/2016 Echo: EF 60-65%, Gr1 DD, mild MR;  c. 08/2016 30 Day Event Monitor: No significant tachy/bradyarrhythmias. No afib.  . Follicular neoplasm of thyroid   . Hemorrhage of rectum and anus   . Hyperlipidemia   . Hyperlipidemia, unspecified   . Hypertension   . Leg pain   . Osteoarthritis   . Pernicious anemia   . Small vessel disease ([place])   . Spinal stenosis, lumbar region without neurogenic claudication   . Subdural hematoma ([place])    a. 2013 in setting of syncope.  . Syncope and collapse    a. 2013 - syncope and fall w/ resultant subdural hematoma; b. prev nl echo and stress test @ [facility].  Marland Kitchen Thyroid nodule   . Vertigo & Chronic Dizziness    a. Previously seen by ENT with Physical Therapy.  MRI 2000 small vessel disease    Past Surgical  History:  Procedure Laterality Date  . BILATERAL SALPINGOOPHORECTOMY    . BREAST CYST EXCISION     ? side, yrs ago  . COLONOSCOPY  01/26/2009   Diverticulosis  . CYSTOSCOPY    . HEMORRHOIDECTOMY BY SIMPLE LIGATION    . PR COLONOSCOPY FLX W/ENDOSCOPIC MUCOSAL RESECTION  09/02/2014   Procedure: COLONOSCOPY, FLEXIBLE; WITH ENDOSCOPIC MUCOSAL RESECTION; Surgeon: Saunders Revel, MD; Location: [facility]; Service: Gastroenterology  . PR COLSC FLX W/RMVL OF TUMOR POLYP LESION SNARE TQ  09/02/2014   Procedure: COLONOSCOPY FLEX; W/REMOV TUMOR/LES BY SNARE; Surgeon: Saunders Revel, MD; Location: GI [facility]URES [facility]; Service: Gastroenterology  . SKIN SURGERY    . TOTAL ABDOMINAL HYSTERECTOMY W/ BILATERAL SALPINGOOPHORECTOMY  1970s    There were no vitals filed for this visit.   Subjective Assessment - 02/20/20 0819    Subjective Pt reports that she is doing well today. She complains of a little bit of dizziness in the bed but minimal. Otherwise she has not had dizziness. She complains of 4/10 chronic low back pain upon arrival. No specific questions or concerns at this time.    Pertinent History Pt reports that about 3 weeks ago she was laying on the floor performing her leg exercises when she started having a  spinning sensation. Symptoms lasted for a few seconds. Vertigo occurred one additional time but pt is unable to recall the circumstances. She saw her doctor who referred her to vestibular therapy. She reports some constant low level dizziness as well. Pt is known to this clinic as she has been previously undergone vestibular therapy. Pt with prior history of 59% R UVH, bilateral BPPV which has been worse in the past on the L side compared to the right, and R superior cerebellar and left lateral occipital infarcts from 2018. Pt has a history of spinal stenosis with low back pain and bilateral hip pain. She was recently treated for this issue with a steroid taper by her MD.  She was also having R wrist swelling/pain and was referred to an orthopedist who told her it was arthritis. Pt states that it has improved and she is not concerned about her wrist at this time. She also reports that she has seen another physical therapist for her back and hip pain with extensive treatment but no real help. She is not interested in pursuing additional treatment for this issue at this time either. Pt has a history of moderate cervical spinal canal narrowing at C4-5 and C5-6 due to disc osteophyte.and severe right neuroforaminal narrowing at C5-6. She also has a history of anxiety and panic attacks. She has seen [facility] neurology for her constant dizziness in the past which was determined to be multifactorial  Related to old cerebellar stroke, microvascular ischemic changes, and inner ear issues.    Limitations Walking    Diagnostic tests 11/30/17: MRI Brain without contrast: No acute intracranial abnormality. Stable non contrast MRI appearance of the brain, with no evidence of significant prior posterior circulation ischemia.    Patient Stated Goals Improve strength, balance, and dizziness    Currently in Pain? Yes    Pain Score 4     Pain Location Back    Pain Orientation Lower    Pain Descriptors / Indicators Aching    Pain Type Chronic pain    Pain Onset More than a month ago    Pain Frequency Intermittent                 TREATMENT    Neuromuscular Re-education  Dix-Hallpike and roll tests performed with patient which are negative bilaterally for nystagmus and vertigo. Pt does still have her typical downbeating nystagmus.  The following exercises performed without UE support in // bars unless otherwise specified: WBOS eyes open/closed x 30s each; WBOS horizontal and vertical head turns x 30s each, pt struggles the most with vertical head turns; NBOS eyes open x 30s; NBOS horizontal and vertical head turns x 30s each; Airex WBOS x 30s;   Ther-ex  Sit to  stand from regular height chair holding 3kg med ball 2 x 5;  Standing exercises with 2.5# ankle weights and BUE support on // bars: Hip flexion marches x 10 BLE; Hip abduction x 10 BLE; Hip extension x 10 BLE;  Seated LAQ with 2.5# ankle weights x 10 BLE; Seated clams with red tband x 10; Seated adductor isometric ball squeeze 3s hold x 10;  Standing heel raises with BUE support on ball x 10;   Pt educated throughout session about proper posture and technique with exercises. Improved exercise technique, movement at target joints, use of target muscles after min to mod verbal, visual, tactile cues.     All BPPV tests are negative today.  Proceeded with seated and standing strengthening as  well as balance exercises during session today.  Patient provided intermittent seated rest breaks due to fatigue.  She struggles considerably with eyes closed balance as well as head turns, specifically vertical.  Provided HEP which encompasses most of the exercise she is already been performing.  Reviewed with patient.  Patient encouraged to stay active at home and follow-up as scheduled. Pt will benefit from PT services to address deficits in strength, balance, and mobility in order to return to full function at home.                    PT Education - 02/20/20 0904    Education Details HEP    Person(s) Educated Patient    Methods Explanation;Handout    Comprehension Verbalized understanding            PT Short Term Goals - 01/30/20 0901      PT SHORT TERM GOAL #1   Title Pt will be independent with HEP in order to decrease dizziness to decrease fall risk and improve function at home.    Time 4    Period Weeks    Status New    Target Date 02/27/20             PT Long Term Goals - 01/30/20 0950      PT LONG TERM GOAL #1   Title Pt will report no further episodes of vertigo in order to ensure safety when transitioning in and out of bed and when bending over while  performing household responsibilities.    Baseline 01/30/20: Pt has had 2 episodes of vertigo over the last 2 weeks but has been modifying her activities to avoid triggering symptoms    Time 8    Period Weeks    Status New    Target Date 03/26/20      PT LONG TERM GOAL #2   Title Pt will improve ABC by at least 13% in order to demonstrate clinically significant improvement in balance confidence.    Baseline 01/30/20: 33.125%    Time 8    Period Weeks    Status New    Target Date 03/26/20      PT LONG TERM GOAL #3   Title Pt will decrease DHI score by at least 18 points in order to demonstrate clinically significant reduction in disability.    Baseline 01/30/20: 48/100    Time 8    Period Weeks    Status New    Target Date 03/26/20      PT LONG TERM GOAL #4   Title Pt will improve FOTO to at least 67 in order to demonstrate clinically significant improvement in function related to dizziness.    Baseline 01/30/20: 52    Time 8    Period Weeks    Status New    Target Date 03/26/20                 Plan - 02/20/20 0826    Personal Factors and Comorbidities Comorbidity 3+    Examination-Activity Limitations Bed Mobility;Bend;Locomotion Level;Reach Overhead;Transfers    Examination-Participation Restrictions Cleaning;Community Activity;Laundry;Shop    Stability/Clinical Decision Making Unstable/Unpredictable    Rehab Potential Good    PT Frequency 1x / week    PT Duration 8 weeks    PT Treatment/Interventions ADLs/Self Care Home Management;Aquatic Therapy;Canalith Repostioning;Cryotherapy;Electrical Stimulation;Iontophoresis 4mg /ml Dexamethasone;Moist Heat;Ultrasound;DME Instruction;Gait training;Stair training;Functional mobility training;Therapeutic activities;Therapeutic exercise;Balance training;Neuromuscular re-education;Cognitive remediation;Patient/family education;Manual techniques;Passive range of motion;Dry needling;Vestibular    PT Next Visit  Plan Repeat  Dix-Hallpike and treat as appropriate, once BPPV clear assess balance if pt desires    PT Home Exercise Plan Access Code: [email or web address]    Consulted and Agree with Plan of Care Patient           Patient will benefit from skilled therapeutic intervention in order to improve the following deficits and impairments:  Decreased balance, Dizziness, Decreased strength  Visit Diagnosis: Dizziness and giddiness  BPPV (benign paroxysmal positional vertigo), left     Problem List Patient Active Problem List   Diagnosis Date Noted  . Lymphedema 03/09/2019  . Swelling of limb 02/28/2019  . Cellulitis of left leg 12/31/2018  . Hip pain, bilateral 12/31/2018  . Cellulitis 12/27/2018  . Carotid stenosis 12/28/2017  . Vertigo 11/30/2017  . Imbalance due to old stroke 11/03/2016  . Gait instability 10/19/2016  . Ataxia 08/30/2016  . History of stroke 08/30/2016  . Dizziness 11/03/2015  . Abnormal PET scan of colon 06/18/2014  . H/O seasonal allergies 06/04/2014  . Lung nodule, solitary 06/04/2014  . 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  . Hypertension 12/04/2011  . Hyperlipidemia 12/04/2011  . Syncope 12/04/2011  . Diabetes mellitus ([place]) 12/04/2011  . Smoking hx 12/04/2011   Phillips Grout PT, DPT, GCS  Gurpreet Mariani 02/20/2020, 9:07 AM  [place] MAIN [facility] SERVICES [address] [place], [place], [address] Phone: [phone]   Fax:  [phone]  Name: Particia Strahm MRN: [id number] Date of Birth: 09-03-1924

## 2020-02-20 NOTE — Patient Instructions (Addendum)
Access Code: [email or web address] URL: [email or web address] Date: 02/20/2020 Prepared by: Roxana Hires  Exercises Seated Gaze Stabilization with Head Rotation - 2 x daily - 7 x weekly - 3 reps - 60 seconds hold Seated Gaze Stabilization with Head Nod - 2 x daily - 7 x weekly - 3 reps - 60s hold Sit to Stand without Arm Support - 1 x daily - 7 x weekly - 2 sets - 10 reps Seated Long Arc Quad - 1 x daily - 7 x weekly - 2 sets - 10 reps - 3s hold Seated Hip Abduction with Resistance - 1 x daily - 7 x weekly - 2 sets - 10 reps - 3s hold Seated Hip Adduction Isometrics with Ball - 1 x daily - 7 x weekly - 2 sets - 10 reps - 3s hold Heel Toe Raises with Counter Support - 1 x daily - 7 x weekly - 2 sets - 10 reps - 3s hold Narrow Stance with Counter Support - 1 x daily - 7 x weekly - 3 reps - 30s hold Standing March with Counter Support - 1 x daily - 7 x weekly - 2 sets - 10 reps - slowly hold

## 2020-02-27 ENCOUNTER — Other Ambulatory Visit: Payer: Self-pay

## 2020-02-27 ENCOUNTER — Ambulatory Visit: Payer: Medicare Other

## 2020-02-27 DIAGNOSIS — R42 Dizziness and giddiness: Secondary | ICD-10-CM

## 2020-02-27 NOTE — Therapy (Signed)
[address] MAIN [facility] SERVICES [address] [place], [place], [address] Phone: [phone]   Fax:  [phone]  Physical Therapy Treatment/Discharge  Patient Details  Name: Claire Washington MRN: [id number] Date of Birth: 04/13/1925 Referring Provider (PT): Dr. Ginette Pitman   Encounter Date: 02/27/2020   PT End of Session - 02/27/20 0814    Visit Number 5    Number of Visits 9    Date for PT Re-Evaluation 03/26/20    Authorization Type eval: 01/30/20    PT Start Time 0800    PT Stop Time 0845    PT Time Calculation (min) 45 min    Equipment Utilized During Treatment Gait belt    Activity Tolerance Patient tolerated treatment well    Behavior During Therapy [facility] for tasks assessed/performed           Past Medical History:  Diagnosis Date   Arthritis    Basal cell carcinoma    basel cell   Chronic cystitis    Diabetes mellitus type 2, controlled, without complications ([place])    type 2 or unspecified type diabetes mellitus without mention of complication, not stated as uncontrolled   Disease of thyroid gland    Embolic stroke ([place])    a. 07/2016 MRI Brain: small right superior cerebellum and left lateral occipital cortex infarcts;  b. 08/2016 Echo: EF 60-65%, Gr1 DD, mild MR;  c. 08/2016 30 Day Event Monitor: No significant tachy/bradyarrhythmias. No afib.   Follicular neoplasm of thyroid    Hemorrhage of rectum and anus    Hyperlipidemia    Hyperlipidemia, unspecified    Hypertension    Leg pain    Osteoarthritis    Pernicious anemia    Small vessel disease ([place])    Spinal stenosis, lumbar region without neurogenic claudication    Subdural hematoma ([place])    a. 2013 in setting of syncope.   Syncope and collapse    a. 2013 - syncope and fall w/ resultant subdural hematoma; b. prev nl echo and stress test @ [facility].   Thyroid nodule    Vertigo & Chronic Dizziness    a. Previously seen by ENT with Physical  Therapy.  MRI 2000 small vessel disease    Past Surgical History:  Procedure Laterality Date   BILATERAL SALPINGOOPHORECTOMY     BREAST CYST EXCISION     ? side, yrs ago   COLONOSCOPY  01/26/2009   Diverticulosis   CYSTOSCOPY     HEMORRHOIDECTOMY BY SIMPLE LIGATION     PR COLONOSCOPY FLX W/ENDOSCOPIC MUCOSAL RESECTION  09/02/2014   Procedure: COLONOSCOPY, FLEXIBLE; WITH ENDOSCOPIC MUCOSAL RESECTION; Surgeon: Saunders Revel, MD; Location: [facility]; Service: Gastroenterology   PR [place] FLX W/RMVL OF TUMOR POLYP LESION SNARE TQ  09/02/2014   Procedure: COLONOSCOPY FLEX; W/REMOV TUMOR/LES BY SNARE; Surgeon: Saunders Revel, MD; Location: GI [facility]; Service: Gastroenterology   SKIN SURGERY     TOTAL ABDOMINAL HYSTERECTOMY W/ BILATERAL SALPINGOOPHORECTOMY  1970s    There were no vitals filed for this visit.   Subjective Assessment - 02/27/20 0810    Subjective Patient denies of any episodes of vertigo and reports that she wants to discharge after today's therapy session. Patient is compliant with her HEP and will continue to maintain progress with HEP.    Pertinent History Pt reports that about 3 weeks ago she was laying on the floor performing her leg exercises when she started having a spinning sensation. Symptoms lasted  for a few seconds. Vertigo occurred one additional time but pt is unable to recall the circumstances. She saw her doctor who referred her to vestibular therapy. She reports some constant low level dizziness as well. Pt is known to this clinic as she has been previously undergone vestibular therapy. Pt with prior history of 59% R UVH, bilateral BPPV which has been worse in the past on the L side compared to the right, and R superior cerebellar and left lateral occipital infarcts from 2018. Pt has a history of spinal stenosis with low back pain and bilateral hip pain. She was recently treated for this issue with a steroid taper by her  MD. She was also having R wrist swelling/pain and was referred to an orthopedist who told her it was arthritis. Pt states that it has improved and she is not concerned about her wrist at this time. She also reports that she has seen another physical therapist for her back and hip pain with extensive treatment but no real help. She is not interested in pursuing additional treatment for this issue at this time either. Pt has a history of moderate cervical spinal canal narrowing at C4-5 and C5-6 due to disc osteophyte.and severe right neuroforaminal narrowing at C5-6. She also has a history of anxiety and panic attacks. She has seen [facility] neurology for her constant dizziness in the past which was determined to be multifactorial  Related to old cerebellar stroke, microvascular ischemic changes, and inner ear issues.    Limitations Walking    Diagnostic tests 11/30/17: MRI Brain without contrast: No acute intracranial abnormality. Stable non contrast MRI appearance of the brain, with no evidence of significant prior posterior circulation ischemia.    Patient Stated Goals Improve strength, balance, and dizziness    Currently in Pain? No/denies    Pain Onset --              [facility] PT Assessment - 02/27/20 0001      Berg Balance Test   Sit to Stand Able to stand without using hands and stabilize independently    Sitting with Back Unsupported but Feet Supported on Floor or Stool Able to sit safely and securely 2 minutes    Stand to Sit Sits safely with minimal use of hands    Standing Unsupported with Eyes Closed Needs help to keep from falling    Standing Unsupported with Feet Together Able to place feet together independently and stand for 1 minute with supervision    From Standing, Reach Forward with Outstretched Arm Reaches forward but needs supervision    From Standing Position, Pick up Object from Floor Unable to pick up and needs supervision    From Standing Position, Turn to Look Behind  Over each Shoulder Needs assist to keep from losing balance and falling    Turn 360 Degrees Needs close supervision or verbal cueing    Standing Unsupported, One Foot in [employer] balance while stepping or standing    Standing on One Leg Tries to lift leg/unable to hold 3 seconds but remains standing independently                 TREATMENT    Neuromuscular Re-education  The following exercises performed without UE support in // bars unless otherwise specified: WBOS eyes open/closed x 30s each; NBOS eyes open/closed x 30s each   Ther-ex  Sit to stand from regular height chair holding 3kg med ball 2 x 5;  Standing exercises with 2.5#  ankle weights and BUE support on // bars: Hip flexion marches x 10 BLE; Hip abduction x 10 BLE; Hip extension x 10 BLE; Hamstring curls x 10 BLE  Seated LAQ with 2.5# ankle weights x 10 BLE; Seated adductor isometric ball squeeze 3s hold x 10;   Patient demonstrates improvement in vertigo symptoms since start of therapy as evidenced by no further episodes of vertigo. Patient has reached her maximum rehab potential at this time with slight improvement in [place] score from 48/100 to 50/100 and minimal improvement with FOTO score from 52 to 53. Patient demonstrates good safety awareness with transfers and gait. Patient was educated on importance of maintaining progress through HEP. Patient encouraged to stay active at home and follow-up as scheduled.                PT Education - 02/27/20 0854    Education Details HEP    Person(s) Educated Patient    Methods Explanation;Verbal cues    Comprehension Verbalized understanding            PT Short Term Goals - 02/27/20 0849      PT SHORT TERM GOAL #1   Title Pt will be independent with HEP in order to decrease dizziness to decrease fall risk and improve function at home.    Time 4    Period Weeks    Status Achieved    Target Date 02/27/20             PT Long Term Goals -  02/27/20 0815      PT LONG TERM GOAL #1   Title Pt will report no further episodes of vertigo in order to ensure safety when transitioning in and out of bed and when bending over while performing household responsibilities.    Baseline 01/30/20: Pt has had 2 episodes of vertigo over the last 2 weeks but has been modifying her activities to avoid triggering symptoms    Time 8    Period Weeks    Status Achieved      PT LONG TERM GOAL #2   Title Pt will improve ABC by at least 13% in order to demonstrate clinically significant improvement in balance confidence.    Baseline 01/30/20: 33.125%, 02/27/20: 33.125%    Time 8    Period Weeks    Status Not Met      PT LONG TERM GOAL #3   Title Pt will decrease DHI score by at least 18 points in order to demonstrate clinically significant reduction in disability.    Baseline 01/30/20: 48/100, 02/27/20: 50/100    Time 8    Period Weeks    Status Partially Met      PT LONG TERM GOAL #4   Title Pt will improve FOTO to at least 67 in order to demonstrate clinically significant improvement in function related to dizziness.    Baseline 01/30/20: 52, 02/27/20: 53    Time 8    Period Weeks    Status Partially Met                 Plan - 02/27/20 0815    Clinical Impression Statement Patient demonstrates improvement in vertigo symptoms since start of therapy as evidenced by no further episodes of vertigo. Patient has reached her maximum rehab potential at this time with slight improvement in [place] score from 48/100 to 50/100 and minimal improvement with FOTO score from 52 to 53. Patient demonstrates good safety awareness with transfers and gait. Patient was  educated on importance of maintaining progress through HEP. Patient encouraged to stay active at home and follow-up as scheduled.    Personal Factors and Comorbidities Comorbidity 3+    Examination-Activity Limitations Bed Mobility;Bend;Locomotion Level;Reach Overhead;Transfers     Examination-Participation Restrictions Cleaning;Community Activity;Laundry;Shop    Stability/Clinical Decision Making Unstable/Unpredictable    Rehab Potential Good    PT Frequency 1x / week    PT Duration 8 weeks    PT Treatment/Interventions ADLs/Self Care Home Management;Aquatic Therapy;Canalith Repostioning;Cryotherapy;Electrical Stimulation;Iontophoresis 4mg /ml Dexamethasone;Moist Heat;Ultrasound;DME Instruction;Gait training;Stair training;Functional mobility training;Therapeutic activities;Therapeutic exercise;Balance training;Neuromuscular re-education;Cognitive remediation;Patient/family education;Manual techniques;Passive range of motion;Dry needling;Vestibular    PT Next Visit Plan Discharged    PT Home Exercise Plan Access Code: [email or web address]    Consulted and Agree with Plan of Care Patient           Patient will benefit from skilled therapeutic intervention in order to improve the following deficits and impairments:  Decreased balance, Dizziness, Decreased strength  Visit Diagnosis: Dizziness and giddiness     Problem List Patient Active Problem List   Diagnosis Date Noted   Lymphedema 03/09/2019   Swelling of limb 02/28/2019   Cellulitis of left leg 12/31/2018   Hip pain, bilateral 12/31/2018   Cellulitis 12/27/2018   Carotid stenosis 12/28/2017   Vertigo 11/30/2017   Imbalance due to old stroke 11/03/2016   Gait instability 10/19/2016   Ataxia 08/30/2016   History of stroke 08/30/2016   Dizziness 11/03/2015   Abnormal PET scan of colon 06/18/2014   H/O seasonal allergies 06/04/2014   Lung nodule, solitary 16/38/4665   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   Hypertension 12/04/2011   Hyperlipidemia 12/04/2011   Syncope 12/04/2011   Diabetes mellitus ([place]) 12/04/2011   Smoking hx 12/04/2011    Karl Luke PT, DPT Netta Corrigan 02/27/2020, 10:02 AM  [place] MAIN [facility] SERVICES [address] [place], [place], [address] Phone: [phone]   Fax:  [phone]  Name: Claire Washington MRN: [id number] Date of Birth: 1924-05-08

## 2020-03-05 ENCOUNTER — Ambulatory Visit: Payer: Medicare Other

## 2020-04-28 ENCOUNTER — Other Ambulatory Visit: Payer: Self-pay | Admitting: Physician Assistant

## 2020-05-21 ENCOUNTER — Other Ambulatory Visit: Payer: Self-pay | Admitting: Physician Assistant

## 2020-05-21 NOTE — Telephone Encounter (Signed)
Rx request sent to pharmacy.  

## 2020-07-02 ENCOUNTER — Emergency Department: Payer: Medicare Other

## 2020-07-02 ENCOUNTER — Other Ambulatory Visit: Payer: Self-pay

## 2020-07-02 ENCOUNTER — Emergency Department
Admission: EM | Admit: 2020-07-02 | Discharge: 2020-07-03 | Disposition: A | Discharge status: Home / Self Care | Payer: Medicare Other | Attending: Emergency Medicine | Admitting: Emergency Medicine

## 2020-07-02 DIAGNOSIS — I10 Essential (primary) hypertension: Secondary | ICD-10-CM | POA: Insufficient documentation

## 2020-07-02 DIAGNOSIS — R42 Dizziness and giddiness: Secondary | ICD-10-CM | POA: Diagnosis present

## 2020-07-02 DIAGNOSIS — Z79899 Other long term (current) drug therapy: Secondary | ICD-10-CM | POA: Diagnosis not present

## 2020-07-02 DIAGNOSIS — Z87891 Personal history of nicotine dependence: Secondary | ICD-10-CM | POA: Insufficient documentation

## 2020-07-02 DIAGNOSIS — Z85828 Personal history of other malignant neoplasm of skin: Secondary | ICD-10-CM | POA: Insufficient documentation

## 2020-07-02 DIAGNOSIS — Z7902 Long term (current) use of antithrombotics/antiplatelets: Secondary | ICD-10-CM | POA: Insufficient documentation

## 2020-07-02 DIAGNOSIS — E119 Type 2 diabetes mellitus without complications: Secondary | ICD-10-CM | POA: Diagnosis not present

## 2020-07-02 DIAGNOSIS — Z85850 Personal history of malignant neoplasm of thyroid: Secondary | ICD-10-CM | POA: Insufficient documentation

## 2020-07-02 DIAGNOSIS — E860 Dehydration: Secondary | ICD-10-CM | POA: Insufficient documentation

## 2020-07-02 LAB — BASIC METABOLIC PANEL
Anion gap: 8 (ref 5–15)
BUN: 33 mg/dL — ABNORMAL HIGH (ref 8–23)
CO2: 23 mmol/L (ref 22–32)
Calcium: 8.7 mg/dL — ABNORMAL LOW (ref 8.9–10.3)
Chloride: 103 mmol/L (ref 98–111)
Creatinine, Ser: 1.12 mg/dL — ABNORMAL HIGH (ref 0.44–1.00)
GFR, Estimated: 46 mL/min — ABNORMAL LOW (ref >60)
Glucose, Bld: 113 mg/dL — ABNORMAL HIGH (ref 70–99)
Potassium: 4 mmol/L (ref 3.5–5.1)
Sodium: 134 mmol/L — ABNORMAL LOW (ref 135–145)

## 2020-07-02 LAB — CBC
HCT: 48.1 % — ABNORMAL HIGH (ref 36.0–46.0)
Hemoglobin: 16.4 g/dL — ABNORMAL HIGH (ref 12.0–15.0)
MCH: 29.3 pg (ref 26.0–34.0)
MCHC: 34.1 g/dL (ref 30.0–36.0)
MCV: 85.9 fL (ref 80.0–100.0)
Platelets: 219 10*3/uL (ref 150–400)
RBC: 5.6 MIL/uL — ABNORMAL HIGH (ref 3.87–5.11)
RDW: 13.2 % (ref 11.5–15.5)
WBC: 11.2 10*3/uL — ABNORMAL HIGH (ref 4.0–10.5)
nRBC: 0 % (ref 0.0–0.2)

## 2020-07-02 LAB — TROPONIN I (HIGH SENSITIVITY): Troponin I (High Sensitivity): 4 ng/L (ref <18)

## 2020-07-02 MED ORDER — LORAZEPAM 2 MG/ML IJ SOLN
0.5000 mg | Freq: Once | INTRAMUSCULAR | Status: AC
  Administered 2020-07-02: 0.5 mg via INTRAVENOUS
  Filled 2020-07-02: qty 1

## 2020-07-02 MED ORDER — DOXYCYCLINE HYCLATE 100 MG PO CAPS: 100.0000 mg | ORAL_CAPSULE | Freq: Two times a day (BID) | ORAL | 0 refills | Status: AC

## 2020-07-02 MED ORDER — LACTATED RINGERS IV BOLUS
1000.0000 mL | Freq: Once | INTRAVENOUS | Status: AC
  Administered 2020-07-02: 1000 mL via INTRAVENOUS

## 2020-07-02 NOTE — Discharge Instructions (Signed)
Your labs today showed significant dehydration.  Drink at least 8 glasses of water daily for the next several days  Since the CLINDAMYCIN was too strong, we are going to switch to DOXYCYCLINE. This covers similar bacteria, but can be easier on you.   PLEASE call your ENT to discuss this medication change, as they may want alternative antibiotics if needed

## 2020-07-02 NOTE — ED Notes (Signed)
IVF placed on pump due to slow infusion via gravity.

## 2020-07-02 NOTE — ED Triage Notes (Signed)
Pt arrives to ER c/o of leg's giving out on her and feeling bad intermittently x 2 days. States she is on a "really strong antibiotic" that she thinks is doing it. Hx HTN, stroke. No pain. States dizziness that became worse this afternoon. Hx balance problems per pt. States feels like the room is spinning.   States PCP sent her to ER for possible stroke.  Speech clear, able to move all extremities, no facial droop, no aphasia noted. In personal wheelchair.

## 2020-07-02 NOTE — ED Notes (Signed)
Attempted to call daughter and phone number went to voicemail.

## 2020-07-02 NOTE — ED Notes (Signed)
Patient transported to MRI 

## 2020-07-02 NOTE — ED Notes (Signed)
Daughter Marcie Bal updated on POC.

## 2020-07-02 NOTE — ED Notes (Signed)
Pt family updated at this time.

## 2020-07-02 NOTE — ED Notes (Signed)
PIV attempts x3 unsuccessful (2 different people); each attempt resulted in the vein being blown

## 2020-07-02 NOTE — ED Provider Notes (Signed)
[facility] Emergency Department Provider Note  ____________________________________________   Event Date/Time   First MD Initiated Contact with Patient 07/02/20 1749     (approximate)  I have reviewed the triage vital signs and the nursing notes.   HISTORY  Chief Complaint Weakness    HPI Claire Washington is a 87 y.o. female here with dizziness and weakness.  The patient states that she is here with generalized weakness and dizziness.  She was recently seen and started on treatment of clindamycin and prednisone for what she says was an infection of her outer ears.  She has been taking this as prescribed.  Over the last 2 days, she has had increasing weakness.  She states she has felt just drained and that she is having difficulty walking due to her legs giving out and not being strong enough to support her.  Earlier today, she also began to feel dizzy.  When asked to elaborate, she has some difficulty describing although she does describe some occasional sensation of the room spinning, but she also has a history of vestibular issues.  Denies any tinnitus.  Denies any headache.  No known fevers.  She feels generally weak right now.  She has been trying to drink plenty of fluids with the antibiotics.  She called her PCP who sent her here for further evaluation and possible MRI for evaluation of stroke.        Past Medical History:  Diagnosis Date  . Arthritis   . Basal cell carcinoma    basel cell  . Chronic cystitis   . Diabetes mellitus type 2, controlled, without complications ([place])    type 2 or unspecified type diabetes mellitus without mention of complication, not stated as uncontrolled  . Disease of thyroid gland   . Embolic stroke ([place])    a. 07/2016 MRI Brain: small right superior cerebellum and left lateral occipital cortex infarcts;  b. 08/2016 Echo: EF 60-65%, Gr1 DD, mild MR;  c. 08/2016 30 Day Event Monitor: No significant  tachy/bradyarrhythmias. No afib.  . Follicular neoplasm of thyroid   . Hemorrhage of rectum and anus   . Hyperlipidemia   . Hyperlipidemia, unspecified   . Hypertension   . Leg pain   . Osteoarthritis   . Pernicious anemia   . Small vessel disease ([place])   . Spinal stenosis, lumbar region without neurogenic claudication   . Subdural hematoma ([place])    a. 2013 in setting of syncope.  . Syncope and collapse    a. 2013 - syncope and fall w/ resultant subdural hematoma; b. prev nl echo and stress test @ [facility].  Marland Kitchen Thyroid nodule   . Vertigo & Chronic Dizziness    a. Previously seen by ENT with Physical Therapy.  MRI 2000 small vessel disease    Patient Active Problem List   Diagnosis Date Noted  . Lymphedema 03/09/2019  . Swelling of limb 02/28/2019  . Cellulitis of left leg 12/31/2018  . Hip pain, bilateral 12/31/2018  . Cellulitis 12/27/2018  . Carotid stenosis 12/28/2017  . Vertigo 11/30/2017  . Imbalance due to old stroke 11/03/2016  . Gait instability 10/19/2016  . Ataxia 08/30/2016  . History of stroke 08/30/2016  . Dizziness 11/03/2015  . Abnormal PET scan of colon 06/18/2014  . H/O seasonal allergies 06/04/2014  . Lung nodule, solitary 06/04/2014  . 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  . Hypertension 12/04/2011  . Hyperlipidemia 12/04/2011  . Syncope 12/04/2011  . Diabetes mellitus (  [place]) 12/04/2011  . Smoking hx 12/04/2011    Past Surgical History:  Procedure Laterality Date  . BILATERAL SALPINGOOPHORECTOMY    . BREAST CYST EXCISION     ? side, yrs ago  . COLONOSCOPY  01/26/2009   Diverticulosis  . CYSTOSCOPY    . HEMORRHOIDECTOMY BY SIMPLE LIGATION    . PR COLONOSCOPY FLX W/ENDOSCOPIC MUCOSAL RESECTION  09/02/2014   Procedure: COLONOSCOPY, FLEXIBLE; WITH ENDOSCOPIC MUCOSAL RESECTION; Surgeon: Saunders Revel, MD; Location[facility]; Service: Gastroenterology  . PR COLSC FLX W/RMVL OF TUMOR POLYP LESION SNARE TQ  09/02/2014    Procedure: COLONOSCOPY FLEX; W/REMOV TUMOR/LES BY SNARE; Surgeon: Saunders Revel, MD; Location: [facility]; Service: Gastroenterology  . SKIN SURGERY    . TOTAL ABDOMINAL HYSTERECTOMY W/ BILATERAL SALPINGOOPHORECTOMY  1970s    Prior to Admission medications   Medication Sig Start Date End Date Taking? Authorizing Provider  doxycycline (VIBRAMYCIN) 100 MG capsule Take 1 capsule (100 mg total) by mouth 2 (two) times daily for 7 days. 07/02/20 07/09/20 Yes Duffy Bruce, MD  amLODipine (NORVASC) 5 MG tablet TAKE 1 TABLET BY MOUTH TWICE A DAY 08/08/19   Minna Merritts, MD  atorvastatin (LIPITOR) 10 MG tablet TAKE 1 TABLET BY MOUTH EVERY DAY 12/29/19   Minna Merritts, MD  clopidogrel (PLAVIX) 75 MG tablet TAKE 1 TABLET BY MOUTH EVERY DAY 02/10/20   Minna Merritts, MD  hydrALAZINE (APRESOLINE) 25 MG tablet TAKE 1 TABLET BY MOUTH 3 TIMES DAILY AS NEEDED FOR SYSTOLIC BLOOD PRESSURE >630 05/21/20   Minna Merritts, MD  losartan (COZAAR) 25 MG tablet Take 2 tablets (50 mg total) by mouth daily. Patient not taking: Reported on 01/30/2020 09/17/19   Minna Merritts, MD  metoprolol tartrate (LOPRESSOR) 25 MG tablet Take 0.5 tablets (12.5 mg total) by mouth 2 (two) times daily. 10/17/17   Theora Gianotti, NP  Multiple Vitamins-Minerals (CENTRUM SILVER PO) Take 1 tablet by mouth daily. Reported on 06/11/2015    [provider]  vitamin B-12 (CYANOCOBALAMIN) 1000 MCG tablet Take by mouth daily.     [provider]  Vitamin D, Ergocalciferol, (DRISDOL) 1.25 MG (50000 UT) CAPS capsule Take 50,000 Units by mouth every 7 (seven) days.    [provider]    Allergies Hydrocortisone, Sulfa antibiotics, and Tape  Family History  Problem Relation Age of Onset  . Colon cancer Mother   . Heart disease Father   . Heart attack Father   . Prostate cancer Father   . Stroke Father   . Heart attack Brother   . Asthma Daughter   . Diabetes Other         Siblings  . Breast cancer Neg Hx     Social History Social History   Tobacco Use  . Smoking status: Former Smoker    Packs/day: 0.25    Years: 20.00    Pack years: 5.00    Types: Cigarettes    Quit date: 09/15/1993    Years since quitting: 26.8  . Smokeless tobacco: Never Used  Vaping Use  . Vaping Use: Never used  Substance Use Topics  . Alcohol use: Yes    Alcohol/week: 2.0 standard drinks    Types: 1 Glasses of wine, 1 Shots of liquor per week    Comment: occassional  . Drug use: No    Review of Systems  Review of Systems  Constitutional: Positive for fatigue. Negative for fever.  HENT: Negative for congestion  and sore throat.   Eyes: Negative for visual disturbance.  Respiratory: Negative for cough and shortness of breath.   Cardiovascular: Negative for chest pain.  Gastrointestinal: Negative for abdominal pain, diarrhea, nausea and vomiting.  Genitourinary: Negative for flank pain.  Musculoskeletal: Positive for gait problem. Negative for back pain and neck pain.  Skin: Negative for rash and wound.  Neurological: Positive for dizziness and weakness.  All other systems reviewed and are negative.    ____________________________________________  PHYSICAL EXAM:      VITAL SIGNS: ED Triage Vitals  Enc Vitals Group     BP 07/02/20 1605 (!) 193/66     Pulse Rate 07/02/20 1605 91     Resp 07/02/20 1605 14     Temp 07/02/20 1605 98 F (36.7 C)     Temp Source 07/02/20 1605 Oral     SpO2 07/02/20 1605 91 %     Weight 07/02/20 1605 118 lb (53.5 kg)     Height 07/02/20 1605 5' (1.524 m)     Head Circumference --      Peak Flow --      Pain Score 07/02/20 1604 0     Pain Loc --      Pain Edu? --      Excl. in [place]? --      Physical Exam Vitals and nursing note reviewed.  Constitutional:      General: She is not in acute distress.    Appearance: She is well-developed.  HENT:     Head: Normocephalic and atraumatic.  Eyes:     Conjunctiva/sclera:  Conjunctivae normal.  Cardiovascular:     Rate and Rhythm: Normal rate and regular rhythm.     Heart sounds: Normal heart sounds. No murmur heard. No friction rub.  Pulmonary:     Effort: Pulmonary effort is normal. No respiratory distress.     Breath sounds: Normal breath sounds. No wheezing or rales.  Abdominal:     General: There is no distension.     Palpations: Abdomen is soft.     Tenderness: There is no abdominal tenderness.  Musculoskeletal:     Cervical back: Neck supple.  Skin:    General: Skin is warm.     Capillary Refill: Capillary refill takes less than 2 seconds.     Findings: No rash.  Neurological:     Mental Status: She is alert and oriented to person, place, and time.     Motor: No abnormal muscle tone.     Comments: Neurological Exam:  Mental Status: Alert and oriented to person, place, and time. Attention and concentration normal. Speech clear. Recent memory is intact. Cranial Nerves: Visual fields grossly intact. EOMI and PERRLA. No nystagmus noted. Facial sensation intact at forehead, maxillary cheek, and chin/mandible bilaterally. No facial asymmetry or weakness. Hearing grossly normal. Uvula is midline, and palate elevates symmetrically. Normal SCM and trapezius strength. Tongue midline without fasciculations. Motor: Muscle strength 5/5 in proximal and distal UE and LE bilaterally. No pronator drift. Muscle tone normal. Sensation: Intact to light touch in upper and lower extremities distally bilaterally.  Gait: Deferred Coordination: Normal FTN bilaterally.          ____________________________________________   LABS (all labs ordered are listed, but only abnormal results are displayed)  Labs Reviewed  BASIC METABOLIC PANEL - Abnormal; Notable for the following components:      Result Value   Sodium 134 (*)    Glucose, Bld 113 (*)    BUN 33 (*)  Creatinine, Ser 1.12 (*)    Calcium 8.7 (*)    GFR, Estimated 46 (*)    All other components  within normal limits  CBC - Abnormal; Notable for the following components:   WBC 11.2 (*)    RBC 5.60 (*)    Hemoglobin 16.4 (*)    HCT 48.1 (*)    All other components within normal limits  URINALYSIS, COMPLETE (UACMP) WITH MICROSCOPIC  CBG MONITORING, ED  TROPONIN I (HIGH SENSITIVITY)    ____________________________________________  EKG: Normal sinus rhythm, ventricular rate 85.  PR 136, QRS 80, QTc 406.  Left ventricular hypertrophy.  No acute ST elevations or depressions. ________________________________________  RADIOLOGY All imaging, including plain films, CT scans, and ultrasounds, independently reviewed by me, and interpretations confirmed via formal radiology reads.  ED MD interpretation:   CT head: No acute abnormality MRI brain: No acute abnormality, no stroke  Official radiology report(s): CT Head Wo Contrast  Result Date: 07/02/2020 CLINICAL DATA:  Nonspecific dizziness. EXAM: CT HEAD WITHOUT CONTRAST TECHNIQUE: Contiguous axial images were obtained from the base of the skull through the vertex without intravenous contrast. COMPARISON:  Head CT 11/22/2017 FINDINGS: Brain: Stable degree of atrophy and chronic small vessel ischemia from prior exam. No intracranial hemorrhage, mass effect, or midline shift. No hydrocephalus. The basilar cisterns are patent. No evidence of territorial infarct or acute ischemia. No extra-axial or intracranial fluid collection. Vascular: Atherosclerosis of skullbase vasculature without hyperdense vessel or abnormal calcification. Skull: No fracture or focal lesion. Sinuses/Orbits: Paranasal sinuses and mastoid air cells are clear. The visualized orbits are unremarkable. No mastoid effusion. Other: None. IMPRESSION: 1. No acute intracranial abnormality. 2. Stable atrophy and chronic small vessel ischemia. Electronically Signed   By: Keith Rake M.D.   On: 07/02/2020 17:14   MR BRAIN WO CONTRAST  Result Date: 07/02/2020 CLINICAL DATA:   Initial evaluation for acute dizziness. EXAM: MRI HEAD WITHOUT CONTRAST TECHNIQUE: Multiplanar, multiecho pulse sequences of the brain and surrounding structures were obtained without intravenous contrast. COMPARISON:  Prior head CT from earlier the same day. FINDINGS: Brain: Age-related cerebral volume loss. Patchy and confluent T2/FLAIR hyperintensity within the periventricular and deep white matter both cerebral hemispheres, most consistent with chronic small vessel ischemic disease, moderate in nature. No abnormal foci of restricted diffusion to suggest acute or subacute ischemia. Gray-white matter differentiation maintained. No encephalomalacia to suggest chronic cortical infarction. No evidence for acute or chronic intracranial hemorrhage. No mass lesion, midline shift or mass effect. No hydrocephalus or extra-axial fluid collection. Pituitary gland suprasellar region normal. Midline structures intact. Vascular: Major intracranial vascular flow voids are maintained. Skull and upper cervical spine: Craniocervical junction within normal limits. Bone marrow signal intensity normal. No focal marrow replacing lesion. No scalp soft tissue abnormality. Sinuses/Orbits: Globes orbital soft tissues within normal limits. Paranasal sinuses are clear. No significant mastoid effusion. Inner ear structures grossly normal. Other: None. IMPRESSION: 1. No acute intracranial abnormality. 2. Age-related cerebral volume loss with moderate chronic microvascular ischemic disease. Electronically Signed   By: Jeannine Boga M.D.   On: 07/02/2020 23:19    ____________________________________________  PROCEDURES   Procedure(s) performed (including Critical Care):  Procedures  ____________________________________________  INITIAL IMPRESSION / MDM / [place] / ED COURSE  As part of my medical decision making, I reviewed the following data within the [place] notes reviewed and  incorporated, Old chart reviewed, Notes from prior ED visits, and [place] Controlled Substance Database       *  Jakeia Buford Gayler was evaluated in Emergency Department on 07/03/2020 for the symptoms described in the history of present illness. She was evaluated in the context of the global COVID-19 pandemic, which necessitated consideration that the patient might be at risk for infection with the SARS-CoV-2 virus that causes COVID-19. Institutional protocols and algorithms that pertain to the evaluation of patients at risk for COVID-19 are in a state of rapid change based on information released by regulatory bodies including the CDC and federal and state organizations. These policies and algorithms were followed during the patient's care in the ED.  Some ED evaluations and interventions may be delayed as a result of limited staffing during the pandemic.*     Medical Decision Making: 87 year old female who with generalized weakness and dizziness.  Suspect orthostasis in the setting of dehydration.  She has recently been placed on prednisone and antibiotics and I suspect this is contributing to generalized weakness and poor p.o. intake.  She has no focal neurological deficits.  CT head and MRI brain obtained, reviewed, and showed no evidence of stroke or other acute abnormality.  She does have a significant ovation of urine and creatinine compared to baseline, and I suspect this is part of her weakness.  She was given fluids and is tolerating p.o. here.  She does have a recent perichondritis, so will be very cautious about stopping antibiotics.  Instead, will switch her from clindamycin which she does not seem to be tolerating well, to doxycycline.  This should cover MRSA and additional skin flora.  Will have her call her ENT to see if she needs additional medications.  Otherwise, no apparent emergent medical pathology.  ____________________________________________  FINAL CLINICAL IMPRESSION(S) / ED  DIAGNOSES  Final diagnoses:  Dehydration  Dizziness     MEDICATIONS GIVEN DURING THIS VISIT:  Medications  lactated ringers bolus 1,000 mL (1,000 mLs Intravenous New Bag/Given 07/02/20 2102)  LORazepam (ATIVAN) injection 0.5 mg (0.5 mg Intravenous Given 07/02/20 2131)     ED Discharge Orders         Ordered    doxycycline (VIBRAMYCIN) 100 MG capsule  2 times daily        07/02/20 2332           Note:  This document was prepared using Dragon voice recognition software and may include unintentional dictation errors.   Duffy Bruce, MD 07/03/20 [place]

## 2020-07-03 ENCOUNTER — Other Ambulatory Visit: Payer: Self-pay | Admitting: Cardiovascular Disease

## 2020-07-03 DIAGNOSIS — E860 Dehydration: Secondary | ICD-10-CM | POA: Diagnosis not present

## 2020-07-03 NOTE — ED Notes (Signed)
Pt tolerated PO snack/beverage without nausea or vomiting.

## 2020-07-13 ENCOUNTER — Other Ambulatory Visit: Payer: Self-pay | Admitting: Internal Medicine

## 2020-07-13 DIAGNOSIS — R102 Pelvic and perineal pain: Secondary | ICD-10-CM

## 2020-07-23 ENCOUNTER — Other Ambulatory Visit: Payer: Self-pay

## 2020-07-23 ENCOUNTER — Ambulatory Visit
Admission: RE | Admit: 2020-07-23 | Discharge: 2020-07-23 | Disposition: A | Discharge status: Home / Self Care | Payer: Medicare Other | Source: Ambulatory Visit | Attending: Internal Medicine | Admitting: Internal Medicine

## 2020-07-23 ENCOUNTER — Other Ambulatory Visit: Payer: Self-pay | Admitting: Internal Medicine

## 2020-07-23 DIAGNOSIS — R102 Pelvic and perineal pain: Secondary | ICD-10-CM

## 2020-08-18 ENCOUNTER — Other Ambulatory Visit: Payer: Self-pay

## 2020-08-18 ENCOUNTER — Emergency Department
Admission: EM | Admit: 2020-08-18 | Discharge: 2020-08-18 | Disposition: A | Discharge status: Home / Self Care | Payer: Medicare Other | Attending: Emergency Medicine | Admitting: Emergency Medicine

## 2020-08-18 DIAGNOSIS — R197 Diarrhea, unspecified: Secondary | ICD-10-CM | POA: Insufficient documentation

## 2020-08-18 DIAGNOSIS — I10 Essential (primary) hypertension: Secondary | ICD-10-CM | POA: Insufficient documentation

## 2020-08-18 DIAGNOSIS — Z5321 Procedure and treatment not carried out due to patient leaving prior to being seen by health care provider: Secondary | ICD-10-CM | POA: Insufficient documentation

## 2020-08-18 DIAGNOSIS — E860 Dehydration: Secondary | ICD-10-CM | POA: Insufficient documentation

## 2020-08-18 LAB — CBC
HCT: 41.7 % (ref 36.0–46.0)
Hemoglobin: 14.2 g/dL (ref 12.0–15.0)
MCH: 28.8 pg (ref 26.0–34.0)
MCHC: 34.1 g/dL (ref 30.0–36.0)
MCV: 84.6 fL (ref 80.0–100.0)
Platelets: 150 K/uL (ref 150–400)
RBC: 4.93 MIL/uL (ref 3.87–5.11)
RDW: 13.4 % (ref 11.5–15.5)
WBC: 7 K/uL (ref 4.0–10.5)
nRBC: 0 % (ref 0.0–0.2)

## 2020-08-18 LAB — COMPREHENSIVE METABOLIC PANEL
ALT: 18 U/L (ref 0–44)
AST: 19 U/L (ref 15–41)
Albumin: 3.9 g/dL (ref 3.5–5.0)
Alkaline Phosphatase: 60 U/L (ref 38–126)
Anion gap: 11 (ref 5–15)
BUN: 12 mg/dL (ref 8–23)
CO2: 23 mmol/L (ref 22–32)
Calcium: 8.5 mg/dL — ABNORMAL LOW (ref 8.9–10.3)
Chloride: 102 mmol/L (ref 98–111)
Creatinine, Ser: 0.5 mg/dL (ref 0.44–1.00)
GFR, Estimated: >60 mL/min (ref >60)
Glucose, Bld: 132 mg/dL — ABNORMAL HIGH (ref 70–99)
Potassium: 3.7 mmol/L (ref 3.5–5.1)
Sodium: 136 mmol/L (ref 135–145)
Total Bilirubin: 1 mg/dL (ref 0.3–1.2)
Total Protein: 6.2 g/dL — ABNORMAL LOW (ref 6.5–8.1)

## 2020-08-18 LAB — LIPASE, BLOOD: Lipase: 31 U/L (ref 11–51)

## 2020-08-18 NOTE — ED Triage Notes (Signed)
Pt to ED for HTN and diarrhea that she states is causing dehydration, states when she drinks it goes straight through her. Pt in NAD, skin color WDL  Pt being rolled on walker by family member and refuses to use wheelchair because it hurts her bottom.

## 2020-08-18 NOTE — ED Triage Notes (Signed)
First Nurse Note:  Arrives c/o HTN and electrolyte imbalance (dehydration).  AAOx3.  Skin warm and dry. NAD

## 2020-08-24 ENCOUNTER — Other Ambulatory Visit: Payer: Self-pay | Admitting: Internal Medicine

## 2020-08-24 ENCOUNTER — Other Ambulatory Visit (HOSPITAL_COMMUNITY): Payer: Self-pay | Admitting: Internal Medicine

## 2020-08-24 DIAGNOSIS — R9389 Abnormal findings on diagnostic imaging of other specified body structures: Secondary | ICD-10-CM

## 2020-08-30 ENCOUNTER — Ambulatory Visit: Admission: RE | Admit: 2020-08-30 | Payer: Medicare Other | Source: Ambulatory Visit

## 2020-08-31 ENCOUNTER — Encounter (INDEPENDENT_AMBULATORY_CARE_PROVIDER_SITE_OTHER): Payer: Self-pay | Admitting: Vascular Surgery

## 2020-08-31 ENCOUNTER — Other Ambulatory Visit: Payer: Self-pay

## 2020-08-31 ENCOUNTER — Ambulatory Visit (INDEPENDENT_AMBULATORY_CARE_PROVIDER_SITE_OTHER): Payer: Medicare Other | Admitting: Vascular Surgery

## 2020-08-31 VITALS — BP 163/70 | HR 82 | Resp 15 | Wt 125.0 lb

## 2020-08-31 DIAGNOSIS — I6523 Occlusion and stenosis of bilateral carotid arteries: Secondary | ICD-10-CM | POA: Diagnosis not present

## 2020-08-31 DIAGNOSIS — E871 Hypo-osmolality and hyponatremia: Secondary | ICD-10-CM

## 2020-08-31 DIAGNOSIS — E1159 Type 2 diabetes mellitus with other circulatory complications: Secondary | ICD-10-CM

## 2020-08-31 DIAGNOSIS — I890 Lymphedema, not elsewhere classified: Secondary | ICD-10-CM

## 2020-08-31 DIAGNOSIS — I10 Essential (primary) hypertension: Secondary | ICD-10-CM

## 2020-08-31 NOTE — Assessment & Plan Note (Signed)
Electrolyte imbalances can certainly worsen her lower extremity swelling

## 2020-08-31 NOTE — Progress Notes (Signed)
MRN : [id number]  Claire Washington is a 87 y.o. ([birth year]) female who presents with chief complaint of  Chief Complaint  Patient presents with  . Follow-up    48yr follow up  .  History of Present Illness: Patient returns today in follow up of her lymphedema.  She is really not been wearing her pump or her stockings as much as previously.  She is had a lot of medical issues including hyponatremia and that seems to be more consuming for her at this point.  She is also followed for relatively mild carotid disease and is scheduled to be checked later this year.  No new ulceration or infection.  The right leg swelling a little bit more.  The left leg is really not that swollen.  This is not all that bothersome to her at this point.  Current Outpatient Medications  Medication Sig Dispense Refill  . amLODipine (NORVASC) 5 MG tablet TAKE 1 TABLET BY MOUTH TWICE A DAY 180 tablet 1  . atorvastatin (LIPITOR) 10 MG tablet TAKE 1 TABLET BY MOUTH EVERY DAY 90 tablet 2  . clopidogrel (PLAVIX) 75 MG tablet TAKE 1 TABLET BY MOUTH EVERY DAY 90 tablet 2  . hydrALAZINE (APRESOLINE) 25 MG tablet TAKE 1 TABLET BY MOUTH 3 TIMES DAILY AS NEEDED FOR SYSTOLIC BLOOD PRESSURE >025 90 tablet 1  . losartan (COZAAR) 25 MG tablet Take 2 tablets (50 mg total) by mouth daily. (Patient taking differently: Take 25 mg by mouth daily.) 180 tablet 3  . metoprolol tartrate (LOPRESSOR) 25 MG tablet Take 0.5 tablets (12.5 mg total) by mouth 2 (two) times daily. 30 tablet 6  . Multiple Vitamins-Minerals (CENTRUM SILVER PO) Take 1 tablet by mouth daily. Reported on 06/11/2015    . vitamin B-12 (CYANOCOBALAMIN) 1000 MCG tablet Take by mouth daily.     . Vitamin D, Ergocalciferol, (DRISDOL) 1.25 MG (50000 UT) CAPS capsule Take 50,000 Units by mouth every 7 (seven) days.     No current facility-administered medications for this visit.    Past Medical History:  Diagnosis Date  . Arthritis   . Basal cell carcinoma    basel  cell  . Chronic cystitis   . Diabetes mellitus type 2, controlled, without complications ([place])    type 2 or unspecified type diabetes mellitus without mention of complication, not stated as uncontrolled  . Disease of thyroid gland   . Embolic stroke ([place])    a. 07/2016 MRI Brain: small right superior cerebellum and left lateral occipital cortex infarcts;  b. 08/2016 Echo: EF 60-65%, Gr1 DD, mild MR;  c. 08/2016 30 Day Event Monitor: No significant tachy/bradyarrhythmias. No afib.  . Follicular neoplasm of thyroid   . Hemorrhage of rectum and anus   . Hyperlipidemia   . Hyperlipidemia, unspecified   . Hypertension   . Leg pain   . Osteoarthritis   . Pernicious anemia   . Small vessel disease ([place])   . Spinal stenosis, lumbar region without neurogenic claudication   . Subdural hematoma ([place])    a. 2013 in setting of syncope.  . Syncope and collapse    a. 2013 - syncope and fall w/ resultant subdural hematoma; b. prev nl echo and stress test @ [facility].  Marland Kitchen Thyroid nodule   . Vertigo & Chronic Dizziness    a. Previously seen by ENT with Physical Therapy.  MRI 2000 small vessel disease    Past Surgical History:  Procedure Laterality Date  . BILATERAL SALPINGOOPHORECTOMY    .  BREAST CYST EXCISION     ? side, yrs ago  . COLONOSCOPY  01/26/2009   Diverticulosis  . CYSTOSCOPY    . HEMORRHOIDECTOMY BY SIMPLE LIGATION    . PR COLONOSCOPY FLX W/ENDOSCOPIC MUCOSAL RESECTION  09/02/2014   Procedure: COLONOSCOPY, FLEXIBLE; WITH ENDOSCOPIC MUCOSAL RESECTION; Surgeon: Saunders Revel, MD; [facility]: [facility]; Service: Gastroenterology  . PR COLSC FLX W/RMVL OF TUMOR POLYP LESION SNARE TQ  09/02/2014   Procedure: COLONOSCOPY FLEX; W/REMOV TUMOR/LES BY SNARE; Surgeon: Saunders Revel, MD; Location: [facility]; Service: Gastroenterology  . SKIN SURGERY    . TOTAL ABDOMINAL HYSTERECTOMY W/ BILATERAL SALPINGOOPHORECTOMY  1970s     Social History    Tobacco Use  . Smoking status: Former Smoker    Packs/day: 0.25    Years: 20.00    Pack years: 5.00    Types: Cigarettes    Quit date: 09/15/1993    Years since quitting: 26.9  . Smokeless tobacco: Never Used  Vaping Use  . Vaping Use: Never used  Substance Use Topics  . Alcohol use: Yes    Alcohol/week: 2.0 standard drinks    Types: 1 Glasses of wine, 1 Shots of liquor per week    Comment: occassional  . Drug use: No      Family History  Problem Relation Age of Onset  . Colon cancer Mother   . Heart disease Father   . Heart attack Father   . Prostate cancer Father   . Stroke Father   . Heart attack Brother   . Asthma Daughter   . Diabetes Other        Siblings  . Breast cancer Neg Hx     Allergies  Allergen Reactions  . Latex Dermatitis  . Hydrocortisone Swelling  . Sulfa Antibiotics Other (See Comments)    Reaction: unknown  . Tape Dermatitis     REVIEW OF SYSTEMS(Negative unless checked)  Constitutional: [] ???Weight loss [] ???Fever [] ???Chills Cardiac: [] ???Chest pain [] ???Chest pressure [] ???Palpitations [] ???Shortness of breath when laying flat [] ???Shortness of breath at rest [] ???Shortness of breath with exertion. Vascular: [] ???Pain in legs with walking [] ???Pain in legs at rest [] ???Pain in legs when laying flat [] ???Claudication [] ???Pain in feet when walking [] ???Pain in feet at rest [] ???Pain in feet when laying flat [] ???History of DVT [] ???Phlebitis [] ???Swelling in legs [] ???Varicose veins [] ???Non-healing ulcers Pulmonary: [] ???Uses home oxygen [] ???Productive cough [] ???Hemoptysis [] ???Wheeze [] ???COPD [] ???Asthma Neurologic: [x] ???Dizziness [] ???Blackouts [] ???Seizures [] ???History of stroke [] ???History of TIA [] ???Aphasia [] ???Temporary blindness [] ???Dysphagia [] ???Weakness or numbness in arms [] ???Weakness or numbness in legs Musculoskeletal: [x] ???Arthritis [] ???Joint swelling  [] ???Joint pain [x] ???Low back pain Hematologic: [] ???Easy bruising [] ???Easy bleeding [] ???Hypercoagulable state [x] ???Anemic [] ???Hepatitis Gastrointestinal: [] ???Blood in stool [] ???Vomiting blood [] ???Gastroesophageal reflux/heartburn [] ???Difficulty swallowing. Genitourinary: [] ???Chronic kidney disease [] ???Difficult urination [] ???Frequent urination [] ???Burning with urination [] ???Blood in urine Skin: [] ???Rashes [] ???Ulcers [] ???Wounds Psychological: [] ???History of anxiety [] ???History of major depression.  Physical Examination  BP (!) 163/70 (BP Location: Left Arm)   Pulse 82   Resp 15   Wt 125 lb (56.7 kg)   BMI 24.41 kg/m  Gen:  WD/WN, NAD.  Appears far younger than stated age Head: [place]/AT, No temporalis wasting. Ear/Nose/Throat: Hearing somewhat diminished, nares w/o erythema or drainage Eyes: Conjunctiva clear. Sclera non-icteric Neck: Supple.  Trachea midline Pulmonary:  Good air movement, no use of accessory muscles.  Cardiac: Somewhat irregular Vascular:  Vessel Right Left  Radial Palpable Palpable           Musculoskeletal: M/S 5/5 throughout.  No  deformity or atrophy.  1+ right lower extremity edema, trace left lower extremity edema. Neurologic: Sensation grossly intact in extremities.  Symmetrical.  Speech is fluent.  Psychiatric: Judgment intact, Mood & affect appropriate for pt's clinical situation. Dermatologic: No rashes or ulcers noted.  No cellulitis or open wounds.       Labs Recent Results (from the past 2160 hour(s))  Basic metabolic panel     Status: Abnormal   Collection Time: 07/02/20  4:09 PM  Result Value Ref Range   Sodium 134 (L) 135 - 145 mmol/L   Potassium 4.0 3.5 - 5.1 mmol/L   Chloride 103 98 - 111 mmol/L   CO2 23 22 - 32 mmol/L   Glucose, Bld 113 (H) 70 - 99 mg/dL    Comment: Glucose reference range applies only to samples taken after fasting for at least 8 hours.   BUN 33 (H) 8 - 23 mg/dL    Creatinine, Ser 1.12 (H) 0.44 - 1.00 mg/dL   Calcium 8.7 (L) 8.9 - 10.3 mg/dL   GFR, Estimated 46 (L) >60 mL/min    Comment: (NOTE) Calculated using the CKD-EPI Creatinine Equation (2021)    Anion gap 8 5 - 15    Comment: Performed at [facility], [address] [place], [place] [address]  CBC     Status: Abnormal   Collection Time: 07/02/20  4:09 PM  Result Value Ref Range   WBC 11.2 (H) 4.0 - 10.5 K/uL   RBC 5.60 (H) 3.87 - 5.11 MIL/uL   Hemoglobin 16.4 (H) 12.0 - 15.0 g/dL   HCT 48.1 (H) 36.0 - 46.0 %   MCV 85.9 80.0 - 100.0 fL   MCH 29.3 26.0 - 34.0 pg   MCHC 34.1 30.0 - 36.0 g/dL   RDW 13.2 11.5 - 15.5 %   Platelets 219 150 - 400 K/uL   nRBC 0.0 0.0 - 0.2 %    Comment: Performed at [facility], [place], [place] [address]  Troponin I (High Sensitivity)     Status: None   Collection Time: 07/02/20  6:57 PM  Result Value Ref Range   Troponin I (High Sensitivity) 4 <18 ng/L    Comment: (NOTE) Elevated high sensitivity troponin I (hsTnI) values and significant  changes across serial measurements may suggest ACS but many other  chronic and acute conditions are known to elevate hsTnI results.  Refer to the "Links" section for chest pain algorithms and additional  guidance. Performed at [facility], [address] [place], [place] [address]   Lipase, blood     Status: None   Collection Time: 08/18/20  4:56 PM  Result Value Ref Range   Lipase 31 11 - 51 U/L    Comment: Performed at [facility], [address] [place], [place] [address]  Comprehensive metabolic panel     Status: Abnormal   Collection Time: 08/18/20  4:56 PM  Result Value Ref Range   Sodium 136 135 - 145 mmol/L   Potassium 3.7 3.5 - 5.1 mmol/L   Chloride 102 98 - 111 mmol/L   CO2 23 22 - 32 mmol/L   Glucose, Bld 132 (H) 70 - 99 mg/dL    Comment: Glucose reference range applies only to samples taken after fasting for at least 8 hours.   BUN 12 8  - 23 mg/dL   Creatinine, Ser 0.50 0.44 - 1.00 mg/dL   Calcium 8.5 (L) 8.9 - 10.3 mg/dL   Total  Protein 6.2 (L) 6.5 - 8.1 g/dL   Albumin 3.9 3.5 - 5.0 g/dL   AST 19 15 - 41 U/L   ALT 18 0 - 44 U/L   Alkaline Phosphatase 60 38 - 126 U/L   Total Bilirubin 1.0 0.3 - 1.2 mg/dL   GFR, Estimated >60 >60 mL/min    Comment: (NOTE) Calculated using the CKD-EPI Creatinine Equation (2021)    Anion gap 11 5 - 15    Comment: Performed at [facility], [address] [place], [place] [address]  CBC     Status: None   Collection Time: 08/18/20  4:56 PM  Result Value Ref Range   WBC 7.0 4.0 - 10.5 K/uL   RBC 4.93 3.87 - 5.11 MIL/uL   Hemoglobin 14.2 12.0 - 15.0 g/dL   HCT 41.7 36.0 - 46.0 %   MCV 84.6 80.0 - 100.0 fL   MCH 28.8 26.0 - 34.0 pg   MCHC 34.1 30.0 - 36.0 g/dL   RDW 13.4 11.5 - 15.5 %   Platelets 150 150 - 400 K/uL   nRBC 0.0 0.0 - 0.2 %    Comment: Performed at [facility], [address] [place], [place] [address]    Radiology No results found.  Assessment/Plan Carotid stenosis To be checked later this year.  No new symptoms.  Hypertension blood pressure control important in reducing the progression of atherosclerotic disease. On appropriate oral medications.   Diabetes mellitus blood glucose control important in reducing the progression of atherosclerotic disease. Also, involved in wound healing. On appropriate medications.  Lymphedema Symptoms are reasonably stable.  She does not use her pump regularly.  She has not really been wearing compression socks as much.  She has had several medical issues including hyponatremia and this has taken the majority of her time.  Fortunately, her legs really have been reasonably stable.  We will be following her for her carotid disease later this year, and we can just see her in follow-up for that and assess her legs at the same time.  Hyponatremia Electrolyte imbalances can certainly worsen her lower  extremity swelling    Leotis Pain, MD  08/31/2020 3:46 PM    This note was created with Dragon medical transcription system.  Any errors from dictation are purely unintentional

## 2020-08-31 NOTE — Assessment & Plan Note (Signed)
Symptoms are reasonably stable.  She does not use her pump regularly.  She has not really been wearing compression socks as much.  She has had several medical issues including hyponatremia and this has taken the majority of her time.  Fortunately, her legs really have been reasonably stable.  We will be following her for her carotid disease later this year, and we can just see her in follow-up for that and assess her legs at the same time.

## 2020-09-24 ENCOUNTER — Telehealth: Payer: Self-pay | Admitting: Cardiovascular Disease

## 2020-09-24 NOTE — Telephone Encounter (Signed)
Pt last saw Dr. Rockey Situ 12/01/19: (Per note) Other secondary hypertension Blood pressure elevated in the morning, recommend she take losartan 25 mg in the evening May need additional losartan 25 in the morning if blood pressure continues to run high Missing doses at times, blood pressure somewhat labile  Pt also takes Amlodipine 5mg  QD, Lopressor 12.5mg  BID, and Hydralazine 25mg  TID PRN for SBP >170  Pt states over past month or so higher BP readings.   Today 6/10 (20 min prior to call) SBP 134 HR 81  6/9 134/55 HR 75 6/8 143/59 HR 81 6/7 144/55 HR 68 6/6 149/56 HR 78 6/5 145/54 HR 69  Pt states 160/64 HR 93 in below message was from several days ago and wanted [place] to note this was one of her "higher" readings.   Denies blurred vision, HA, and feels stable at this time. Does report dizziness "I always have dizziness."   Advised pt to continue medications as prescribed at this time and bring BP log to f/u visit that is scheduled next week 09/28/20 with Marrianne Mood, PA.  Advised to take Hydralazine per PRN instructions for SBP >170 and if develops s/s noted above to go to the ER.  Pt voiced understanding and has no further questions at this time.

## 2020-09-24 NOTE — Telephone Encounter (Signed)
Pt c/o BP issue: STAT if pt c/o blurred vision, one-sided weakness or slurred speech  1. What are your last 5 BP readings? 160/64  93  2. Are you having any other symptoms (ex. Dizziness, headache, blurred vision, passed out)? Dizziness, electrolyte concerns, fluid retention concerns  3. What is your BP issue? High with increase HR as well  Patient scheduled 7/14

## 2020-09-27 ENCOUNTER — Emergency Department
Admission: EM | Admit: 2020-09-27 | Discharge: 2020-09-27 | Disposition: A | Discharge status: Home / Self Care | Payer: Medicare Other | Attending: Emergency Medicine | Admitting: Emergency Medicine

## 2020-09-27 ENCOUNTER — Other Ambulatory Visit: Payer: Self-pay

## 2020-09-27 ENCOUNTER — Emergency Department: Payer: Medicare Other

## 2020-09-27 DIAGNOSIS — I10 Essential (primary) hypertension: Secondary | ICD-10-CM | POA: Diagnosis not present

## 2020-09-27 DIAGNOSIS — Z79899 Other long term (current) drug therapy: Secondary | ICD-10-CM | POA: Diagnosis not present

## 2020-09-27 DIAGNOSIS — Z87891 Personal history of nicotine dependence: Secondary | ICD-10-CM | POA: Insufficient documentation

## 2020-09-27 DIAGNOSIS — E119 Type 2 diabetes mellitus without complications: Secondary | ICD-10-CM | POA: Diagnosis not present

## 2020-09-27 DIAGNOSIS — R531 Weakness: Secondary | ICD-10-CM | POA: Diagnosis present

## 2020-09-27 DIAGNOSIS — Z91040 Latex allergy status: Secondary | ICD-10-CM | POA: Diagnosis not present

## 2020-09-27 DIAGNOSIS — R42 Dizziness and giddiness: Secondary | ICD-10-CM | POA: Insufficient documentation

## 2020-09-27 LAB — CBC
HCT: 45.2 % (ref 36.0–46.0)
Hemoglobin: 15.6 g/dL — ABNORMAL HIGH (ref 12.0–15.0)
MCH: 29.5 pg (ref 26.0–34.0)
MCHC: 34.5 g/dL (ref 30.0–36.0)
MCV: 85.4 fL (ref 80.0–100.0)
Platelets: 196 10*3/uL (ref 150–400)
RBC: 5.29 MIL/uL — ABNORMAL HIGH (ref 3.87–5.11)
RDW: 13.7 % (ref 11.5–15.5)
WBC: 7 10*3/uL (ref 4.0–10.5)
nRBC: 0 % (ref 0.0–0.2)

## 2020-09-27 LAB — BASIC METABOLIC PANEL
Anion gap: 7 (ref 5–15)
BUN: 13 mg/dL (ref 8–23)
CO2: 25 mmol/L (ref 22–32)
Calcium: 8.9 mg/dL (ref 8.9–10.3)
Chloride: 105 mmol/L (ref 98–111)
Creatinine, Ser: 0.62 mg/dL (ref 0.44–1.00)
GFR, Estimated: >60 mL/min (ref >60)
Glucose, Bld: 164 mg/dL — ABNORMAL HIGH (ref 70–99)
Potassium: 4.2 mmol/L (ref 3.5–5.1)
Sodium: 137 mmol/L (ref 135–145)

## 2020-09-27 MED ORDER — SODIUM CHLORIDE 0.9 % IV BOLUS
500.0000 mL | Freq: Once | INTRAVENOUS | Status: AC
  Administered 2020-09-27: 500 mL via INTRAVENOUS

## 2020-09-27 NOTE — ED Provider Notes (Signed)
[facility] Emergency Department Provider Note   ____________________________________________    I have reviewed the triage vital signs and the nursing notes.   HISTORY  Chief Complaint Weakness     HPI Claire Washington is a 87 y.o. female who presents with complaints of weakness, dizziness.  Patient reports this morning she felt like she might faint.  She felt better after sitting down.  Symptoms occurred while standing.  She reports has had this in the past as well and needed IV fluids.  She denies chest pain or palpitations.  No nausea vomiting or abdominal pain.  No fevers chills or cough.  No new medications reported.  Past Medical History:  Diagnosis Date   Arthritis    Basal cell carcinoma    basel cell   Chronic cystitis    Diabetes mellitus type 2, controlled, without complications ([place])    type 2 or unspecified type diabetes mellitus without mention of complication, not stated as uncontrolled   Disease of thyroid gland    Embolic stroke ([place])    a. 07/2016 MRI Brain: small right superior cerebellum and left lateral occipital cortex infarcts;  b. 08/2016 Echo: EF 60-65%, Gr1 DD, mild MR;  c. 08/2016 30 Day Event Monitor: No significant tachy/bradyarrhythmias. No afib.   Follicular neoplasm of thyroid    Hemorrhage of rectum and anus    Hyperlipidemia    Hyperlipidemia, unspecified    Hypertension    Leg pain    Osteoarthritis    Pernicious anemia    Small vessel disease ([place])    Spinal stenosis, lumbar region without neurogenic claudication    Subdural hematoma ([place])    a. 2013 in setting of syncope.   Syncope and collapse    a. 2013 - syncope and fall w/ resultant subdural hematoma; b. prev nl echo and stress test @ [facility].   Thyroid nodule    Vertigo & Chronic Dizziness    a. Previously seen by ENT with Physical Therapy.  MRI 2000 small vessel disease    Patient Active Problem List   Diagnosis Date Noted    Hyponatremia 08/31/2020   Lymphedema 03/09/2019   Swelling of limb 02/28/2019   Cellulitis of left leg 12/31/2018   Hip pain, bilateral 12/31/2018   Cellulitis 12/27/2018   Carotid stenosis 12/28/2017   Vertigo 11/30/2017   Imbalance due to old stroke 11/03/2016   Gait instability 10/19/2016   Ataxia 08/30/2016   History of stroke 08/30/2016   Dizziness 11/03/2015   Abnormal PET scan of colon 06/18/2014   H/O seasonal allergies 06/04/2014   Lung nodule, solitary 65/06/5463   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   Hypertension 12/04/2011   Hyperlipidemia 12/04/2011   Syncope 12/04/2011   Diabetes mellitus ([place]) 12/04/2011   Smoking hx 12/04/2011    Past Surgical History:  Procedure Laterality Date   BILATERAL SALPINGOOPHORECTOMY     BREAST CYST EXCISION     ? side, yrs ago   COLONOSCOPY  01/26/2009   Diverticulosis   CYSTOSCOPY     HEMORRHOIDECTOMY BY SIMPLE LIGATION     PR COLONOSCOPY FLX W/ENDOSCOPIC MUCOSAL RESECTION  09/02/2014   Procedure: COLONOSCOPY, FLEXIBLE; WITH ENDOSCOPIC MUCOSAL RESECTION; Surgeon: Saunders Revel, MD; Location: [facility]; Service: Gastroenterology   PR [place] FLX W/RMVL OF TUMOR POLYP LESION SNARE TQ  09/02/2014   Procedure: COLONOSCOPY FLEX; W/REMOV TUMOR/LES BY SNARE; Surgeon: Saunders Revel, MD; Location: [facility]; Service: Gastroenterology   SKIN  SURGERY     TOTAL ABDOMINAL HYSTERECTOMY W/ BILATERAL SALPINGOOPHORECTOMY  1970s    Prior to Admission medications   Medication Sig Start Date End Date Taking? Authorizing Provider  amLODipine (NORVASC) 5 MG tablet TAKE 1 TABLET BY MOUTH TWICE A DAY 07/05/20   Minna Merritts, MD  atorvastatin (LIPITOR) 10 MG tablet TAKE 1 TABLET BY MOUTH EVERY DAY 12/29/19   Minna Merritts, MD  clopidogrel (PLAVIX) 75 MG tablet TAKE 1 TABLET BY MOUTH EVERY DAY 02/10/20   Minna Merritts, MD  hydrALAZINE (APRESOLINE) 25 MG tablet TAKE 1 TABLET BY MOUTH 3 TIMES DAILY  AS NEEDED FOR SYSTOLIC BLOOD PRESSURE >902 05/21/20   Minna Merritts, MD  losartan (COZAAR) 25 MG tablet Take 2 tablets (50 mg total) by mouth daily. Patient taking differently: Take 25 mg by mouth daily. 09/17/19   Minna Merritts, MD  metoprolol tartrate (LOPRESSOR) 25 MG tablet Take 0.5 tablets (12.5 mg total) by mouth 2 (two) times daily. 10/17/17   Theora Gianotti, NP  Multiple Vitamins-Minerals (CENTRUM SILVER PO) Take 1 tablet by mouth daily. Reported on 06/11/2015    [provider]  vitamin B-12 (CYANOCOBALAMIN) 1000 MCG tablet Take by mouth daily.     [provider]  Vitamin D, Ergocalciferol, (DRISDOL) 1.25 MG (50000 UT) CAPS capsule Take 50,000 Units by mouth every 7 (seven) days.    [provider]     Allergies Latex, Hydrocortisone, Sulfa antibiotics, and Tape  Family History  Problem Relation Age of Onset   Colon cancer Mother    Heart disease Father    Heart attack Father    Prostate cancer Father    Stroke Father    Heart attack Brother    Asthma Daughter    Diabetes Other        Siblings   Breast cancer Neg Hx     Social History Social History   Tobacco Use   Smoking status: Former    Packs/day: 0.25    Years: 20.00    Pack years: 5.00    Types: Cigarettes    Quit date: 09/15/1993    Years since quitting: 27.0   Smokeless tobacco: Never  Vaping Use   Vaping Use: Never used  Substance Use Topics   Alcohol use: Yes    Alcohol/week: 2.0 standard drinks    Types: 1 Glasses of wine, 1 Shots of liquor per week    Comment: occassional   Drug use: No    Review of Systems  Constitutional: No fever/chills Eyes: No visual changes.  ENT: No sore throat. Cardiovascular: Denies chest pain. Respiratory: Denies shortness of breath. Gastrointestinal: No abdominal pain.   Genitourinary: Negative for dysuria. Musculoskeletal: Negative for back pain. Skin: Negative for rash. Neurological: Negative for headaches or  weakness   ____________________________________________   PHYSICAL EXAM:  VITAL SIGNS: ED Triage Vitals  Enc Vitals Group     BP 09/27/20 1058 (!) 163/56     Pulse Rate 09/27/20 1330 64     Resp 09/27/20 1058 19     Temp 09/27/20 1058 98.1 F (36.7 C)     Temp src --      SpO2 09/27/20 1058 100 %     Weight --      Height --      Head Circumference --      Peak Flow --      Pain Score 09/27/20 1058 4     Pain Loc --  Pain Edu? --      Excl. in [place]? --     Constitutional: Alert and oriented. No acute distress. Pleasant and interactive  Nose: No congestion/rhinnorhea. Mouth/Throat: Mucous membranes are moist.    Cardiovascular: Normal rate, regular rhythm. Good peripheral circulation. Respiratory: Normal respiratory effort.  No retractions.   Musculoskeletal: No lower extremity tenderness nor edema.  Warm and well perfused Neurologic:  Normal speech and language. No gross focal neurologic deficits are appreciated.  Skin:  Skin is warm, dry and intact. No rash noted. Psychiatric: Mood and affect are normal. Speech and behavior are normal.  ____________________________________________   LABS (all labs ordered are listed, but only abnormal results are displayed)  Labs Reviewed  BASIC METABOLIC PANEL - Abnormal; Notable for the following components:      Result Value   Glucose, Bld 164 (*)    All other components within normal limits  CBC - Abnormal; Notable for the following components:   RBC 5.29 (*)    Hemoglobin 15.6 (*)    All other components within normal limits  URINALYSIS, COMPLETE (UACMP) WITH MICROSCOPIC  CBG MONITORING, ED   ____________________________________________  EKG  ED ECG REPORT I, Lavonia Drafts, the attending physician, personally viewed and interpreted this ECG.  Date: 09/27/2020  Rhythm: normal sinus rhythm QRS Axis: normal Intervals: normal ST/T Wave abnormalities: normal Narrative Interpretation: no evidence of acute  ischemia  ____________________________________________  RADIOLOGY  Chest x-ray unremarkable ____________________________________________  PROCEDURES  Procedure(s) performed: No  Procedures   Critical Care performed: No ____________________________________________   INITIAL IMPRESSION / ASSESSMENT AND PLAN / ED COURSE  Pertinent labs & imaging results that were available during my care of the patient were reviewed by me and considered in my medical decision making (see chart for details).   Patient presents with generalized weakness, dizziness and concern for dehydration as noted above.  She has had the symptoms before and improved rapidly with IV fluids.  Lab work today is overall quite reassuring, BUN to creatinine ratio is overall unremarkable, blood pressure is mildly elevated but no hypotension.  Heart rate is normal.  Patient treat with IV fluids and feels much better on reevaluation.  She does not follow-up with her PCP tomorrow for further evaluation of these episodes.  She is comfortable with discharge given reassuring work-up, asymptomatic at this time    ____________________________________________   FINAL CLINICAL IMPRESSION(S) / ED DIAGNOSES  Final diagnoses:  Generalized weakness        Note:  This document was prepared using Dragon voice recognition software and may include unintentional dictation errors.    Lavonia Drafts, MD 09/27/20 [phone]

## 2020-09-27 NOTE — ED Triage Notes (Signed)
Pt comes with c/o weakness, dizziness and increased dehydration. Pt states she hasn't been able to keep anything down. Pt states weakness to legs.   Pt states her BP has been elevated as well. Pt denies any CP but states some SOB

## 2020-09-28 ENCOUNTER — Encounter: Payer: Self-pay | Admitting: Physician Assistant

## 2020-09-28 ENCOUNTER — Ambulatory Visit (INDEPENDENT_AMBULATORY_CARE_PROVIDER_SITE_OTHER): Payer: Medicare Other | Admitting: Physician Assistant

## 2020-09-28 VITALS — BP 122/62 | HR 70 | Ht 60.0 in | Wt 120.2 lb

## 2020-09-28 DIAGNOSIS — I10 Essential (primary) hypertension: Secondary | ICD-10-CM

## 2020-09-28 DIAGNOSIS — R0989 Other specified symptoms and signs involving the circulatory and respiratory systems: Secondary | ICD-10-CM

## 2020-09-28 DIAGNOSIS — E785 Hyperlipidemia, unspecified: Secondary | ICD-10-CM

## 2020-09-28 DIAGNOSIS — Z8673 Personal history of transient ischemic attack (TIA), and cerebral infarction without residual deficits: Secondary | ICD-10-CM

## 2020-09-28 DIAGNOSIS — Z87898 Personal history of other specified conditions: Secondary | ICD-10-CM

## 2020-09-28 DIAGNOSIS — R0600 Dyspnea, unspecified: Secondary | ICD-10-CM | POA: Diagnosis not present

## 2020-09-28 DIAGNOSIS — E1159 Type 2 diabetes mellitus with other circulatory complications: Secondary | ICD-10-CM

## 2020-09-28 DIAGNOSIS — I340 Nonrheumatic mitral (valve) insufficiency: Secondary | ICD-10-CM

## 2020-09-28 DIAGNOSIS — I6523 Occlusion and stenosis of bilateral carotid arteries: Secondary | ICD-10-CM

## 2020-09-28 MED ORDER — LOSARTAN POTASSIUM 25 MG PO TABS: ORAL_TABLET | ORAL | 1 refills | Status: DC

## 2020-09-28 MED ORDER — METOPROLOL TARTRATE 25 MG PO TABS: 12.5000 mg | ORAL_TABLET | Freq: Two times a day (BID) | ORAL | 3 refills | Status: DC

## 2020-09-28 NOTE — Progress Notes (Signed)
Office Visit    Patient Name: Claire Washington Date of Encounter: 09/28/2020  PCP:  Tracie Harrier, MD   [place]  Cardiologist:  Ida Rogue, MD  Advanced Practice Provider:  No care team member to display Electrophysiologist:  None   Chief Complaint    Chief Complaint  Patient presents with   Follow-up    After ED visit---seen yesterday, 09/27/20    87 y.o. 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  Past Medical History    Past Medical History:  Diagnosis Date   Arthritis    Basal cell carcinoma    basel cell   Chronic cystitis    Diabetes mellitus type 2, controlled, without complications ([place])    type 2 or unspecified type diabetes mellitus without mention of complication, not stated as uncontrolled   Disease of thyroid gland    Embolic stroke ([place])    a. 07/2016 MRI Brain: small right superior cerebellum and left lateral occipital cortex infarcts;  b. 08/2016 Echo: EF 60-65%, Gr1 DD, mild MR;  c. 08/2016 30 Day Event Monitor: No significant tachy/bradyarrhythmias. No afib.   Follicular neoplasm of thyroid    Hemorrhage of rectum and anus    Hyperlipidemia    Hyperlipidemia, unspecified    Hypertension    Leg pain    Osteoarthritis    Pernicious anemia    Small vessel disease ([place])    Spinal stenosis, lumbar region without neurogenic claudication    Subdural hematoma ([place])    a. 2013 in setting of syncope.   Syncope and collapse    a. 2013 - syncope and fall w/ resultant subdural hematoma; b. prev nl echo and stress test @ [facility].   Thyroid nodule    Vertigo & Chronic Dizziness    a. Previously seen by ENT  with Physical Therapy.  MRI 2000 small vessel disease   Past Surgical History:  Procedure Laterality Date   BILATERAL SALPINGOOPHORECTOMY     BREAST CYST EXCISION     ? side, yrs ago   COLONOSCOPY  01/26/2009   Diverticulosis   CYSTOSCOPY     HEMORRHOIDECTOMY BY SIMPLE LIGATION     PR COLONOSCOPY FLX W/ENDOSCOPIC MUCOSAL RESECTION  09/02/2014   Procedure: COLONOSCOPY, FLEXIBLE; WITH ENDOSCOPIC MUCOSAL RESECTION; Surgeon: Saunders Revel, MD; [facility]; Service: Gastroenterology   PR [place] FLX W/RMVL OF TUMOR POLYP LESION SNARE TQ  09/02/2014   Procedure: COLONOSCOPY FLEX; W/REMOV TUMOR/LES BY SNARE; Surgeon: Saunders Revel, MD; [facility]: [facility]; Service: Gastroenterology   SKIN SURGERY     TOTAL ABDOMINAL HYSTERECTOMY W/ BILATERAL SALPINGOOPHORECTOMY  1970s    Allergies  Allergies  Allergen Reactions   Latex Dermatitis   Hydrocortisone Swelling   Sulfa Antibiotics Other (See Comments)    Reaction: unknown   Tape Dermatitis    History of Present Illness    Claire Washington is a 87 y.o. female with PMH as above.    She has a long history of dizziness and unsteady gait. Prior episode of syncope was noted in 2013 with resultant SDH.  She was evaluated with stress testing and echo at [facility], both of which were without significant findings.  She was evaluated by ENT and  underwent physical therapy without improvement in dizziness.  Event monitor 2018 showed NSR, rare PVCs at less than 1% burden, and no significant arrhythmia.  Echo 2018 showed normal LVSF, no significant valvular disease.  She was evaluated by neurology given her history of strokes.    No further cardiac work-up was felt to be needed with regards to her strokes and dizziness.    She has been seen in the hospital several more times for weakness, presyncope, and unsteady gait.  She refused to take meclizine.  Follow-up carotid ultrasound 12/2017 with 1 to 39% bilateral ICA  stenosis.  She was seen by her primary cardiologist with BP elevated and requested trial off of her BB. Amlodipine titrated to 5 mg for 2 weeks.  When seen at follow-up 06/13/2018, she noted that her dizziness/gait instability did not change off of metoprolol.  She was restarted on metoprolol but reported taking metoprolol 12.5 mg twice daily unless her BP was greater than 160, at which time she would take 25 mg of metoprolol.  She was still taking amlodipine 5 mg daily.  She was eating excess amounts of sodium and reported a 4 pound weight gain, as well as increasing BPs with systolics 259D to 638V.  She was seen by her PCP and started on losartan 25 mg daily.    She was seen 12/01/2019 by her primary cardiologist and reported garbled speech 3 months earlier, as well as confusion that lasted a few minutes and then resolved without residual symptoms.  BP labile. She reported hip pain and insomnia 2/2 pain.  It was recommended she take losartan 25 mg in the evening and possible addition of losartan 25 mg in the morning if BP continues to run high.  She reported missing doses at times with labile BP.  She was following with neurology for microvascular disease/strokes.  Regular exercise program was recommended.  She continued to follow with Dr. Lucky Cowboy for moderate carotid disease on the right.  She continued on Plavix, statin/Lipitor.  Recommendation was follow-up in 1 year.  Since that time, she reports multiple visits to the emergency department due to elevated BP.  On further discussion today, she reports elevated BP is often at the time at which she takes her medications.  Today, 09/28/2020, she returns to clinic and notes frustration regarding ongoing elevated blood pressure.  BP today well controlled at 122/62.  She reports that her BP is often elevated at home and before taking her medications.  We discussed the importance of waiting 2 hours after her medications in order to check her BP, as this is the reading  that we are most interested in for titration of medications.  She reports tachypalpitations and racing heart rate.  She typically takes her night medications at 9:30 PM with recommendation to take these earlier, so that she is able to monitor her pressures 2 hours after her medications.  She reports feeling weak and uneasy whenever her blood pressure is elevated.  BP log reviewed today with patient, as well as her medication instructions.  She reports taking losartan 25 mg only once per day.  We discussed that previous notes recommend taking 25 mg in the morning and 25 mg at night.  Her current prescription is for 50 mg total, though she is taking only losartan 25 mg total per day.  We discussed recommendation for additional Lopressor 0.5 tablets (12.5 mg total) if BP is elevated as previously recommended and her progress notes.  She is still taking hydralazine  for SBP above 170 but does not take this on a regular basis with discussion regarding this medications tendency for reflex tachycardia and desire to wean her off this medication in the future, as well as consolidate her medications.  She does report she has recently been started on Xanax 0.25 mg each day for sleep as she continues to report issues with insomnia. She reports LEE but denies other signs or symptoms of volume overload.  No orthopnea or PND.  No early satiety.  She reports dizziness/presyncope but denies syncope, or recent falls.  No chest pain at rest or with exertion.  She continues to follow with Dr. Lucky Cowboy for her carotid disease.  Home Medications   Current Outpatient Medications  Medication Instructions   ALPRAZolam (XANAX) 0.25 MG tablet 1 tablet, Oral, Daily PRN, Up to 20 days   amLODipine (NORVASC) 5 MG tablet TAKE 1 TABLET BY MOUTH TWICE A DAY   atorvastatin (LIPITOR) 10 MG tablet TAKE 1 TABLET BY MOUTH EVERY DAY   clopidogrel (PLAVIX) 75 MG tablet TAKE 1 TABLET BY MOUTH EVERY DAY   hydrALAZINE (APRESOLINE) 25 MG tablet TAKE 1  TABLET BY MOUTH 3 TIMES DAILY AS NEEDED FOR SYSTOLIC BLOOD PRESSURE >086   losartan (COZAAR) 25 mg, Oral, Daily   metoprolol tartrate (LOPRESSOR) 12.5 mg, Oral, 2 times daily   Multiple Vitamins-Minerals (CENTRUM SILVER PO) 1 tablet, Oral, Daily, Reported on 06/11/2015   vitamin B-12 (CYANOCOBALAMIN) 1000 MCG tablet Oral, Daily   Vitamin D (Ergocalciferol) (DRISDOL) 50,000 Units, Oral, Every 7 days     Review of Systems    She reports labile BP and tachypalpitations with associated weakness and uneasiness. She reports dizziness and LEE.   She denies chest pain, dyspnea, pnd, orthopnea, n, v, syncope, weight gain, or early satiety.  All other systems reviewed and are otherwise negative except as noted above.  Physical Exam    VS:  BP 122/62   Pulse 70   Ht 5' (1.524 m)   Wt 120 lb 3.2 oz (54.5 kg)   BMI 23.47 kg/m  , BMI Body mass index is 23.47 kg/m. GEN: Well nourished, well developed, in no acute distress. HEENT: normal. Neck: Supple, no JVD, carotid bruits, or masses. Cardiac: RRR, 1/6 systolic murmur.  No rubs, or gallops. No clubbing, cyanosis.mild bilateral non-pitting edema.  Radials/DP/PT 2+ and equal bilaterally.  Respiratory:  Respirations regular and unlabored, clear to auscultation bilaterally. GI: Soft, nontender, nondistended, BS + x 4. MS: no deformity or atrophy. Skin: warm and dry, no rash. Neuro:  Strength and sensation are intact. Psych: Normal affect.  Accessory Clinical Findings    ECG personally reviewed by me today - no ekg- no acute changes.  VITALS Reviewed today   Temp Readings from Last 3 Encounters:  09/27/20 98.1 F (36.7 C)  08/18/20 98.9 F (37.2 C) (Oral)  07/02/20 98 F (36.7 C) (Oral)   BP Readings from Last 3 Encounters:  09/28/20 122/62  09/27/20 (!) 140/53  08/31/20 (!) 163/70   Pulse Readings from Last 3 Encounters:  09/28/20 70  09/27/20 73  08/31/20 82    Wt Readings from Last 3 Encounters:  09/28/20 120 lb 3.2 oz (54.5  kg)  08/31/20 125 lb (56.7 kg)  08/18/20 120 lb (54.4 kg)     LABS  reviewed today   Lab Results  Component Value Date   WBC 7.0 09/27/2020   HGB 15.6 (H) 09/27/2020   HCT 45.2 09/27/2020   MCV 85.4 09/27/2020  PLT 196 09/27/2020   Lab Results  Component Value Date   CREATININE 0.62 09/27/2020   BUN 13 09/27/2020   NA 137 09/27/2020   K 4.2 09/27/2020   CL 105 09/27/2020   CO2 25 09/27/2020   Lab Results  Component Value Date   ALT 18 08/18/2020   AST 19 08/18/2020   ALKPHOS 60 08/18/2020   BILITOT 1.0 08/18/2020   No results found for: CHOL, HDL, LDLCALC, LDLDIRECT, TRIG, CHOLHDL  No results found for: HGBA1C Lab Results  Component Value Date   TSH 4.292 12/26/2018     STUDIES/PROCEDURES reviewed today     LE [place]  02/2019 Summary:  Right: There is no evidence of deep vein thrombosis in the lower  extremity. However, portions of this examination were limited- see  technologist comments above.There is no evidence of chronic venous  insufficiency.There is no evidence of superficial  venous thrombosis.  Left: There is no evidence of deep vein thrombosis in the lower extremity.  However, portions of this examination were limited- see technologist  comments above. There is no evidence of chronic venous insufficiency.There  is no evidence of superficial  venous thrombosis.     Arterial study nl 02/2019  Carotids 12/2017 Right Carotid: Velocities in the right ICA are consistent with a 1-39%  stenosis.                 Mild calcific plaque in the proximal ICA.   Left Carotid: Velocities in the left ICA are consistent with a 1-39%  stenosis.                Mild calcific plaque in the bulb/proximal ICA.   Vertebrals:  Bilateral vertebral arteries demonstrate antegrade flow.  Subclavians: Normal flow hemodynamics were seen in bilateral subclavian               arteries.    2D echo  08/2016: - Left ventricle: The cavity size was normal. There was mild    concentric hypertrophy. Systolic function was normal. The   estimated ejection fraction was in the range of 60% to 65%. Wall   motion was normal; there were no regional wall motion   abnormalities. Doppler parameters are consistent with abnormal   left ventricular relaxation (grade 1 diastolic dysfunction). - Mitral valve: Calcified annulus. There was mild regurgitation. - Pulmonary arteries: Systolic pressure could not be accurately   estimated. __________   Outpatient cardiac monitoring 08/2016: Normal sinus rhythm Rare PVCs, <1% No other significant arrhythmia noted Symptoms of dizziness did not seem to correlate to underlying arrhythmia __________  Assessment & Plan    Hypertension --BP today well controlled.  Blood pressure log reviewed today with labile pressures.  Encouraged BP checks 2 hours after her medications and only once per day.  We discussed that elevated BP before her medications is not concerning, as long as her blood pressure is controlled on her medications.  Reviewed previous medication recommendations, as she is not currently taking her medications as listed on her medication list with the office.  Recommended Lopressor 12.5 mg twice daily with an additional Lopressor 12.5 mg as needed for elevated heart rate if BP allows.  She has not been taking an additional Lopressor 12.5 mg yet for elevated rates but will do so moving forward.  Also recommended was losartan 25 mg in the a.m. and 25 mg in the p.m (for total of losartan 50 mg daily).  She reports only taking losartan 25 mg daily total  and will start taking an additional tablet/losartan 25 mg in the a.m. and p.m.  We discussed that these medication changes were recommended at previous clinic visits with patient understanding.  Continue Lopressor, losartan, and amlodipine with as needed hydralazine for now with recommendation that we consolidate her medications and wean off hydralazine if possible.  As noted at previous  clinic visits, amlodipine has not been escalated to 10 mg daily given concern for increased lower extremity edema with this medication.  Recommend very slow changes in her medications, given concern for HTN in a 87 year old female with report of dizziness.  Reviewed recommendations for fluid and sodium restriction.  Will update an echo to reassess heart pressures in the setting of labile BP and report of LEE/DOE, as well as previous MR and per pt preference at this time.   History of DOE and Dizziness --Given her age and report of dizziness, caution with escalation of antihypertensives.  Reports LEE and DOE though euvolemic on exam. Discussed timing of BP checks. Discussed that glucose can influence her dizziness as well. As above, we did not make any medication changes today but rather corrected her current medication regimen to that which was recommended by her primary cardiologist at previous clinic visits.  She will start taking losartan 50 mg daily and an additional 12.5 mg tablet for elevated rates as above. Also as above, amlodipine 10mg  qd not recommended at previous clinic visits due to concern for lower extremity edema and as confirmed by patient today.  As we discussed today, hydralazine can result in reflex tachycardia, so preference would be to wean her off of this medication in the future. Slow position changes recommended to avoid hypotension/falls given her age and report of dizziness.  Recommend updated carotids /  follow-up per vascular surgery recommendations. As above, will update an echo to reassess valvular function, EF, WM, and heart pressures given murmur on exam and reported sx.  Mitral regurgitation, mild -- Repeat echo given known mitral regurgitation and patient reported ongoing dizziness and DOE. Previous 2018 echo with mild MR.   History of stroke --Continue Plavix as directed by neurology. Continue statin.  HLD, LDL goal less than 70 -- Continue Lipitor with goal LDL less  than 70.  Addition of Zetia per PCP recommended if needed to obtain LDL goal and with consideration of age.  DM2 -- Glycemic control recommended for risk factor modification.  Medication changes: None.  Corrected her current medications to frequency and doses recommended at previous clinic visits.   --Losartan 25 mg in the a.m. and losartan 25 mg in the p.m. for total of losartan 50 mg daily.  She was only taking Losartan 25mg  daily. --Lopressor 12.5 mg twice daily with additional Lopressor 12.5 mg as needed for elevated rates above 90 bpm if SBP above 110.  OR in lieu of hydralazine if SBP above 160 and HR above 60. --Per pt preference, will continue hydralazine for SBP above 160, though would like to wean her off of this medication in the future given reflex tachycardia associated with this medication.  Suggested she try taking an additional metoprolol 12.5 mg as needed for SBP greater than 160, as long as heart rate is greater than 60 bpm at that time. If this helps her BP, we will wean off her hydralazine and increase her lopressor dose. Ideally, consolidation of medications is recommended to encourage compliance and reduce medication confusion. Labs ordered: BMET today. Repeat BMET at RTC. Studies / Imaging ordered: Echo given murmur  on exam with previous MR, as well as given report of lower extremity edema/labile BP/DOE/dizziness.  Future considerations: Wean off of hydralazine and further titrations to BP medications as needed.  Ideally, consolidate medications to reduce medication confusion and encourage compliance. Disposition: RTC after echo  *Please be aware that the above documentation was completed voice recognition software and may contain dictation errors.    Arvil Chaco, PA-C 09/28/2020

## 2020-09-28 NOTE — Patient Instructions (Signed)
Medication Instructions:   Your physician has recommended you make the following change in your medication:   TAKE an additional Metoprolol Tartrate 12.5mg  AS NEEDED for top number of blood pressure 160 or greater and/or pulse 90 or greater  CHANGE Losartan 25mg  - one tablet in the morning and one tablet at bedtime  *If you need a refill on your cardiac medications before your next appointment, please call your pharmacy*   Lab Work:  None ordered  Testing/Procedures:  Your physician has requested that you have an echocardiogram. Echocardiography is a painless test that uses sound waves to create images of your heart. It provides your doctor with information about the size and shape of your heart and how well your heart's chambers and valves are working. This procedure takes approximately one hour. There are no restrictions for this procedure.   Follow-Up: At [facility], you and your health needs are our priority.  As part of our continuing mission to provide you with exceptional heart care, we have created designated Provider Care Teams.  These Care Teams include your primary Cardiologist (physician) and Advanced Practice Providers (APPs -  Physician Assistants and Nurse Practitioners) who all work together to provide you with the care you need, when you need it.  We recommend signing up for the patient portal called "MyChart".  Sign up information is provided on this After Visit Summary.  MyChart is used to connect with patients for Virtual Visits (Telemedicine).  Patients are able to view lab/test results, encounter notes, upcoming appointments, etc.  Non-urgent messages can be sent to your provider as well.   To learn more about what you can do with MyChart, go to [email or web address].    Your next appointment:    Follow up after Echo  The format for your next appointment:   In Person  Provider:   You may see Ida Rogue, MD or one of the following Advanced Practice  Providers on your designated Care Team:    Marrianne Mood, [place]

## 2020-09-29 ENCOUNTER — Emergency Department
Admission: EM | Admit: 2020-09-29 | Discharge: 2020-09-29 | Disposition: A | Discharge status: Home / Self Care | Payer: Medicare Other | Attending: Emergency Medicine | Admitting: Emergency Medicine

## 2020-09-29 ENCOUNTER — Other Ambulatory Visit: Payer: Self-pay

## 2020-09-29 ENCOUNTER — Emergency Department: Payer: Medicare Other

## 2020-09-29 ENCOUNTER — Telehealth: Payer: Self-pay | Admitting: Physician Assistant

## 2020-09-29 DIAGNOSIS — R531 Weakness: Secondary | ICD-10-CM | POA: Diagnosis present

## 2020-09-29 DIAGNOSIS — R42 Dizziness and giddiness: Secondary | ICD-10-CM | POA: Insufficient documentation

## 2020-09-29 DIAGNOSIS — Z87891 Personal history of nicotine dependence: Secondary | ICD-10-CM | POA: Insufficient documentation

## 2020-09-29 DIAGNOSIS — Z85850 Personal history of malignant neoplasm of thyroid: Secondary | ICD-10-CM | POA: Insufficient documentation

## 2020-09-29 DIAGNOSIS — Z20822 Contact with and (suspected) exposure to covid-19: Secondary | ICD-10-CM | POA: Insufficient documentation

## 2020-09-29 DIAGNOSIS — R197 Diarrhea, unspecified: Secondary | ICD-10-CM | POA: Diagnosis not present

## 2020-09-29 DIAGNOSIS — E119 Type 2 diabetes mellitus without complications: Secondary | ICD-10-CM | POA: Diagnosis not present

## 2020-09-29 DIAGNOSIS — Z85828 Personal history of other malignant neoplasm of skin: Secondary | ICD-10-CM | POA: Diagnosis not present

## 2020-09-29 DIAGNOSIS — R630 Anorexia: Secondary | ICD-10-CM | POA: Diagnosis not present

## 2020-09-29 DIAGNOSIS — Z79899 Other long term (current) drug therapy: Secondary | ICD-10-CM | POA: Insufficient documentation

## 2020-09-29 DIAGNOSIS — Z91040 Latex allergy status: Secondary | ICD-10-CM | POA: Diagnosis not present

## 2020-09-29 DIAGNOSIS — I10 Essential (primary) hypertension: Secondary | ICD-10-CM | POA: Diagnosis not present

## 2020-09-29 LAB — CBC WITH DIFFERENTIAL/PLATELET
Abs Immature Granulocytes: 0.01 10*3/uL (ref 0.00–0.07)
Basophils Absolute: 0.1 10*3/uL (ref 0.0–0.1)
Basophils Relative: 1 %
Eosinophils Absolute: 0.1 10*3/uL (ref 0.0–0.5)
Eosinophils Relative: 2 %
HCT: 45.4 % (ref 36.0–46.0)
Hemoglobin: 15.4 g/dL — ABNORMAL HIGH (ref 12.0–15.0)
Immature Granulocytes: 0 %
Lymphocytes Relative: 20 %
Lymphs Abs: 1 10*3/uL (ref 0.7–4.0)
MCH: 29.4 pg (ref 26.0–34.0)
MCHC: 33.9 g/dL (ref 30.0–36.0)
MCV: 86.6 fL (ref 80.0–100.0)
Monocytes Absolute: 0.3 10*3/uL (ref 0.1–1.0)
Monocytes Relative: 7 %
Neutro Abs: 3.2 10*3/uL (ref 1.7–7.7)
Neutrophils Relative %: 70 %
Platelets: 198 10*3/uL (ref 150–400)
RBC: 5.24 MIL/uL — ABNORMAL HIGH (ref 3.87–5.11)
RDW: 13.5 % (ref 11.5–15.5)
WBC: 4.7 10*3/uL (ref 4.0–10.5)
nRBC: 0 % (ref 0.0–0.2)

## 2020-09-29 LAB — URINALYSIS, COMPLETE (UACMP) WITH MICROSCOPIC
Bacteria, UA: NONE SEEN
Bilirubin Urine: NEGATIVE
Glucose, UA: NEGATIVE mg/dL
Hgb urine dipstick: NEGATIVE
Ketones, ur: NEGATIVE mg/dL
Nitrite: NEGATIVE
Protein, ur: 30 mg/dL — AB
Specific Gravity, Urine: 1.004 — ABNORMAL LOW (ref 1.005–1.030)
Squamous Epithelial / HPF: NONE SEEN (ref 0–5)
pH: 6 (ref 5.0–8.0)

## 2020-09-29 LAB — RESP PANEL BY RT-PCR (FLU A&B, COVID) ARPGX2
Influenza A by PCR: NEGATIVE
Influenza B by PCR: NEGATIVE
SARS Coronavirus 2 by RT PCR: NEGATIVE

## 2020-09-29 LAB — COMPREHENSIVE METABOLIC PANEL
ALT: 14 U/L (ref 0–44)
AST: 16 U/L (ref 15–41)
Albumin: 3.8 g/dL (ref 3.5–5.0)
Alkaline Phosphatase: 57 U/L (ref 38–126)
Anion gap: 10 (ref 5–15)
BUN: 10 mg/dL (ref 8–23)
CO2: 28 mmol/L (ref 22–32)
Calcium: 9 mg/dL (ref 8.9–10.3)
Chloride: 102 mmol/L (ref 98–111)
Creatinine, Ser: 0.71 mg/dL (ref 0.44–1.00)
GFR, Estimated: >60 mL/min (ref >60)
Glucose, Bld: 132 mg/dL — ABNORMAL HIGH (ref 70–99)
Potassium: 4.2 mmol/L (ref 3.5–5.1)
Sodium: 140 mmol/L (ref 135–145)
Total Bilirubin: 0.9 mg/dL (ref 0.3–1.2)
Total Protein: 6.5 g/dL (ref 6.5–8.1)

## 2020-09-29 LAB — MAGNESIUM: Magnesium: 2.1 mg/dL (ref 1.7–2.4)

## 2020-09-29 LAB — CK: Total CK: 22 U/L — ABNORMAL LOW (ref 38–234)

## 2020-09-29 LAB — TSH: TSH: 2.545 u[IU]/mL (ref 0.350–4.500)

## 2020-09-29 MED ORDER — LACTATED RINGERS IV BOLUS
1000.0000 mL | Freq: Once | INTRAVENOUS | Status: AC
  Administered 2020-09-29: 1000 mL via INTRAVENOUS

## 2020-09-29 NOTE — Discharge Instructions (Addendum)
Your lab tests, chest x-ray, and COVID/flu test are all okay today.  Continue drinking plenty of fluids and avoid hot weather.  Please follow-up with your doctor for continued evaluation of your symptoms.

## 2020-09-29 NOTE — ED Triage Notes (Addendum)
Pt to ED c/o dizziness, weakness, "legs about to collapse, quivering" that is same or slightly worse than a few days ago when pt was seen in ED for same.  Pt walks with walker usually but today pt felt like "legs were going to give out".   Pt came via [facility] from [facility] independent living in [place].  EDP has already seen pt.  Pt has Holter monitor that was placed yesterday at PCP office.

## 2020-09-29 NOTE — ED Notes (Signed)
Dr Joni Fears with pt and family. Discussing discharge.

## 2020-09-29 NOTE — ED Notes (Signed)
Pt is from independent living, not assisted living. No handoff required.

## 2020-09-29 NOTE — ED Provider Notes (Signed)
[facility] Emergency Department Provider Note  ____________________________________________  Time seen: Approximately 9:43 AM  I have reviewed the triage vital signs and the nursing notes.   HISTORY  Chief Complaint Weakness  Level 5 Caveat: Portions of the History and Physical including HPI and review of systems are unable to be completely obtained due to patient being a poor historian    HPI Claire Washington is a 87 y.o. female with a history of diabetes and prior stroke who was sent to the ED from independent living due to generalized weakness and dizziness with standing that is been going on for about 4 months, worse in the last few weeks.  She has been to the ED a few days ago, got fluids and felt better and went home.  She followed up with primary care and cardiology yesterday and had a heart monitor placed.  Denies pain or shortness of breath.  No fever or chills.  She does report decreased appetite and fluid intake and occasional loose bowel movements.  She feels frustrated that she has not yet found a satisfying explanation for the symptoms.  Denies any focal weakness.  No vision changes or headaches.  No paresthesias    Past Medical History:  Diagnosis Date   Arthritis    Basal cell carcinoma    basel cell   Chronic cystitis    Diabetes mellitus type 2, controlled, without complications ([place])    type 2 or unspecified type diabetes mellitus without mention of complication, not stated as uncontrolled   Disease of thyroid gland    Embolic stroke ([place])    a. 07/2016 MRI Brain: small right superior cerebellum and left lateral occipital cortex infarcts;  b. 08/2016 Echo: EF 60-65%, Gr1 DD, mild MR;  c. 08/2016 30 Day Event Monitor: No significant tachy/bradyarrhythmias. No afib.   Follicular neoplasm of thyroid    Hemorrhage of rectum and anus    Hyperlipidemia    Hyperlipidemia, unspecified    Hypertension    Leg pain    Osteoarthritis     Pernicious anemia    Small vessel disease ([place])    Spinal stenosis, lumbar region without neurogenic claudication    Subdural hematoma ([place])    a. 2013 in setting of syncope.   Syncope and collapse    a. 2013 - syncope and fall w/ resultant subdural hematoma; b. prev nl echo and stress test @ [facility].   Thyroid nodule    Vertigo & Chronic Dizziness    a. Previously seen by ENT with Physical Therapy.  MRI 2000 small vessel disease     Patient Active Problem List   Diagnosis Date Noted   Hyponatremia 08/31/2020   Lymphedema 03/09/2019   Swelling of limb 02/28/2019   Cellulitis of left leg 12/31/2018   Hip pain, bilateral 12/31/2018   Cellulitis 12/27/2018   Carotid stenosis 12/28/2017   Vertigo 11/30/2017   Imbalance due to old stroke 11/03/2016   Gait instability 10/19/2016   Ataxia 08/30/2016   History of stroke 08/30/2016   Dizziness 11/03/2015   Abnormal PET scan of colon 06/18/2014   H/O seasonal allergies 06/04/2014   Lung nodule, solitary 23/76/2831   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   Hypertension 12/04/2011   Hyperlipidemia 12/04/2011   Syncope 12/04/2011   Diabetes mellitus ([place]) 12/04/2011   Smoking hx 12/04/2011     Past Surgical History:  Procedure Laterality Date   BILATERAL SALPINGOOPHORECTOMY     BREAST CYST EXCISION     ?  side, yrs ago   COLONOSCOPY  01/26/2009   Diverticulosis   CYSTOSCOPY     HEMORRHOIDECTOMY BY SIMPLE LIGATION     PR COLONOSCOPY FLX W/ENDOSCOPIC MUCOSAL RESECTION  09/02/2014   Procedure: COLONOSCOPY, FLEXIBLE; WITH ENDOSCOPIC MUCOSAL RESECTION; Surgeon: Saunders Revel, MD; Location[facility]; Service: Gastroenterology   PR [place] FLX W/RMVL OF TUMOR POLYP LESION SNARE TQ  09/02/2014   Procedure: COLONOSCOPY FLEX; W/REMOV TUMOR/LES BY SNARE; Surgeon: Saunders Revel, MD; Location: [facility]; Service: Gastroenterology   SKIN SURGERY     TOTAL ABDOMINAL HYSTERECTOMY W/ BILATERAL  SALPINGOOPHORECTOMY  1970s     Prior to Admission medications   Medication Sig Start Date End Date Taking? Authorizing Provider  ALPRAZolam Duanne Moron) 0.25 MG tablet Take 1 tablet by mouth daily as needed for sleep. Up to 20 days 09/28/20 10/18/20  [provider]  amLODipine (NORVASC) 5 MG tablet TAKE 1 TABLET BY MOUTH TWICE A DAY 07/05/20   Minna Merritts, MD  atorvastatin (LIPITOR) 10 MG tablet TAKE 1 TABLET BY MOUTH EVERY DAY 12/29/19   Minna Merritts, MD  clopidogrel (PLAVIX) 75 MG tablet TAKE 1 TABLET BY MOUTH EVERY DAY 02/10/20   Minna Merritts, MD  hydrALAZINE (APRESOLINE) 25 MG tablet TAKE 1 TABLET BY MOUTH 3 TIMES DAILY AS NEEDED FOR SYSTOLIC BLOOD PRESSURE >443 05/21/20   Minna Merritts, MD  losartan (COZAAR) 25 MG tablet Take one tablet (25mg ) in the morning and one tablet (25mg ) at bedtime 09/28/20   Marrianne Mood D, PA-C  metoprolol tartrate (LOPRESSOR) 25 MG tablet Take 0.5 tablets (12.5 mg total) by mouth 2 (two) times daily. Take an additional half tablet for top number of blood pressure 160 or greater and/or pulse 90 or greater. 09/28/20 09/23/21  Marrianne Mood D, PA-C  Multiple Vitamins-Minerals (CENTRUM SILVER PO) Take 1 tablet by mouth daily. Reported on 06/11/2015    [provider]  vitamin B-12 (CYANOCOBALAMIN) 1000 MCG tablet Take by mouth daily.     [provider]  Vitamin D, Ergocalciferol, (DRISDOL) 1.25 MG (50000 UT) CAPS capsule Take 50,000 Units by mouth every 7 (seven) days.    [provider]     Allergies Latex, Hydrocortisone, Sulfa antibiotics, and Tape   Family History  Problem Relation Age of Onset   Colon cancer Mother    Heart disease Father    Heart attack Father    Prostate cancer Father    Stroke Father    Heart attack Brother    Asthma Daughter    Diabetes Other        Siblings   Breast cancer Neg Hx     Social History Social History   Tobacco Use   Smoking status: Former    Packs/day: 0.25     Years: 20.00    Pack years: 5.00    Types: Cigarettes    Quit date: 09/15/1993    Years since quitting: 27.0   Smokeless tobacco: Never  Vaping Use   Vaping Use: Never used  Substance Use Topics   Alcohol use: Yes    Alcohol/week: 2.0 standard drinks    Types: 1 Glasses of wine, 1 Shots of liquor per week    Comment: occassional   Drug use: No    Review of Systems  Constitutional:   No fever or chills.  ENT:   No sore throat. No rhinorrhea. Cardiovascular:   No chest pain or syncope. Respiratory:   No dyspnea or cough.  Gastrointestinal:   Negative for abdominal pain, vomiting and diarrhea.  Musculoskeletal:   Negative for focal pain or swelling All other systems reviewed and are negative except as documented above in ROS and HPI.  ____________________________________________   PHYSICAL EXAM:  VITAL SIGNS: ED Triage Vitals  Enc Vitals Group     BP 09/29/20 0924 (!) 139/56     Pulse Rate 09/29/20 0924 66     Resp 09/29/20 0924 16     Temp 09/29/20 0924 97.7 F (36.5 C)     Temp Source 09/29/20 0924 Oral     SpO2 09/29/20 0924 96 %     Weight 09/29/20 0922 118 lb (53.5 kg)     Height 09/29/20 0922 5' (1.524 m)     Head Circumference --      Peak Flow --      Pain Score 09/29/20 0922 0     Pain Loc --      Pain Edu? --      Excl. in [place]? --     Vital signs reviewed, nursing assessments reviewed.   Constitutional:   Alert and oriented. Non-toxic appearance. Eyes:   Conjunctivae are normal. EOMI. PERRL. ENT      Head:   Normocephalic and atraumatic.      Nose:   Normal      Mouth/Throat:   Dry mucous membranes      Neck:   No meningismus. Full ROM. Hematological/Lymphatic/Immunilogical:   No cervical lymphadenopathy. Cardiovascular:   RRR. Symmetric bilateral radial and DP pulses.  No murmurs. Cap refill less than 2 seconds. Respiratory:   Normal respiratory effort without tachypnea/retractions. Breath sounds are clear and equal bilaterally. No  wheezes/rales/rhonchi. Gastrointestinal:   Soft and nontender. Non distended. There is no CVA tenderness.  No rebound, rigidity, or guarding. Genitourinary:   deferred Musculoskeletal:   Normal range of motion in all extremities. No joint effusions.  No lower extremity tenderness.  No edema. Neurologic:   Normal speech and language.  Motor grossly intact. Cerebellar function intact No acute focal neurologic deficits are appreciated.  Skin:    Skin is warm, dry and intact. No rash noted.  No petechiae, purpura, or bullae.  ____________________________________________    LABS (pertinent positives/negatives) (all labs ordered are listed, but only abnormal results are displayed) Labs Reviewed  COMPREHENSIVE METABOLIC PANEL - Abnormal; Notable for the following components:      Result Value   Glucose, Bld 132 (*)    All other components within normal limits  CBC WITH DIFFERENTIAL/PLATELET - Abnormal; Notable for the following components:   RBC 5.24 (*)    Hemoglobin 15.4 (*)    All other components within normal limits  CK - Abnormal; Notable for the following components:   Total CK 22 (*)    All other components within normal limits  URINALYSIS, COMPLETE (UACMP) WITH MICROSCOPIC - Abnormal; Notable for the following components:   Color, Urine STRAW (*)    APPearance CLEAR (*)    Specific Gravity, Urine 1.004 (*)    Protein, ur 30 (*)    Leukocytes,Ua TRACE (*)    All other components within normal limits  RESP PANEL BY RT-PCR (FLU A&B, COVID) ARPGX2  MAGNESIUM  TSH   ____________________________________________   EKG    ____________________________________________    RADIOLOGY  DG Chest Portable 1 View  Result Date: 09/29/2020 CLINICAL DATA:  Shortness of breath and weakness EXAM: PORTABLE CHEST 1 VIEW COMPARISON:  09/27/2020 FINDINGS: Cardiac shadow is stable. Soft tissue  density is again noted in the right mid lung stable from the recent exam as well as a prior CT  examination. No new focal infiltrate or effusion is seen. No acute bony abnormality is noted. IMPRESSION: No acute abnormality noted. Electronically Signed   By: Inez Catalina M.D.   On: 09/29/2020 09:41    ____________________________________________   PROCEDURES Procedures  ____________________________________________  DIFFERENTIAL DIAGNOSIS   Dehydration, electrolyte abnormality, UTI, viral illness, intermittent dysrhythmia  CLINICAL IMPRESSION / ASSESSMENT AND PLAN / ED COURSE  Medications ordered in the ED: Medications  lactated ringers bolus 1,000 mL (0 mLs Intravenous Stopped 09/29/20 1110)    Pertinent labs & imaging results that were available during my care of the patient were reviewed by me and considered in my medical decision making (see chart for details).  Claire Washington was evaluated in Emergency Department on 09/29/2020 for the symptoms described in the history of present illness. She was evaluated in the context of the global COVID-19 pandemic, which necessitated consideration that the patient might be at risk for infection with the SARS-CoV-2 virus that causes COVID-19. Institutional protocols and algorithms that pertain to the evaluation of patients at risk for COVID-19 are in a state of rapid change based on information released by regulatory bodies including the CDC and federal and state organizations. These policies and algorithms were followed during the patient's care in the ED.   Patient presents with recurrent episode of lightheadedness and fatigue.  She has been evaluated for this previously.  No falls or trauma.  No focal symptoms.  Exam is benign and reassuring, vital signs unremarkable.  Clinically she appears somewhat dehydrated.  Will check labs, give IV fluids.  Chest x-ray unremarkable.   ----------------------------------------- 12:09 PM on 09/29/2020 ----------------------------------------- Labs unremarkable, COVID and flu negative, stable for  discharge     ____________________________________________   FINAL CLINICAL IMPRESSION(S) / ED DIAGNOSES    Final diagnoses:  Generalized weakness     ED Discharge Orders     None       Portions of this note were generated with dragon dictation software. Dictation errors may occur despite best attempts at proofreading.   Carrie Mew, MD 09/29/20 1209

## 2020-09-29 NOTE — Telephone Encounter (Signed)
Pt c/o BP issue: STAT if pt c/o blurred vision, one-sided weakness or slurred speech  1. What are your last 5 BP readings? 176/73 this morning before medication. Patient went to ED for BP concerns and others BP was 178/? Patient has left ED 2. Are you having any other symptoms (ex. Dizziness, headache, blurred vision, passed out)? Dizzy, quivers, leg weakness  3. What is your BP issue? Spikes in BP  ED said she checked out but patient would like to speak nurse

## 2020-09-30 ENCOUNTER — Other Ambulatory Visit (HOSPITAL_COMMUNITY): Payer: Self-pay | Admitting: Internal Medicine

## 2020-09-30 ENCOUNTER — Other Ambulatory Visit: Payer: Self-pay | Admitting: Internal Medicine

## 2020-09-30 DIAGNOSIS — R42 Dizziness and giddiness: Secondary | ICD-10-CM

## 2020-09-30 DIAGNOSIS — R531 Weakness: Secondary | ICD-10-CM

## 2020-09-30 DIAGNOSIS — R4781 Slurred speech: Secondary | ICD-10-CM

## 2020-09-30 NOTE — Telephone Encounter (Signed)
Called and spoke with patient and informed her of both of of the following recommendations from j. Visser as copied and pasted below:  Is she taking her additional metoprolol 1/2 tablet for elevated pressure?  She also has additional hydralazine for elevated BP.   If her HR is above 60, she can take a full extra metoprolol for elevated pressure.  BP measurements before her medication are not helpful, as we cannot titrate medication based on these readings. It would be helpful to have her BP and HR 2 hours after her medications, as we discussed in office. Also, daily weights are helpful as well. These should be taken at the same time every day, preferablyin the AM.    Please see my previous message  If the additional 1 metoprolol recommended in my previous message controls her symptoms well (I had advised in a previous note that she take an additional metoprolol 25mg  for elevated BP above 140/90 and HR above 60), please have her increase to lopressor 25mg  BID going forward. I believe that she needs more HR and BP control in the future but will keep all dose changes slow given her age.She should be taking her losartan 25mg  twice per day as well.   Reviewed the above with patient and she stated that her PCP told her today to only use the Hydralazine if absolutely necessary. She will start taking one whole tablet of Lopressor 25 MG  if she is above the parameters outlined above. She also stated that she will take her BP 2 hours after morning medications. She is recording a daily weight.  She is taking her Losartan twice a day but stated that she usually takes her 2nd dose in the afternoon as that is when her BP creeps up.  She will try using an extra Lopressor if needed for the next week and then update [place] what her BP has been running. If she is still running high, she stated she would like to see Dr. Rockey Situ.   Patient appreciative for the call back.

## 2020-10-26 ENCOUNTER — Telehealth: Payer: Self-pay | Admitting: Cardiovascular Disease

## 2020-10-26 NOTE — Telephone Encounter (Signed)
Patient recently wore a heart monitor through Dr.Hande's office. Patient states Dr.Hande sent the results to Dr. Rockey Situ for review and patient is curious as to what Dr.Gollan thinks  Please advise

## 2020-10-26 NOTE — Telephone Encounter (Signed)
Unable to see Holter monitor results in [place] fax to Dr. Ginette Pitman office at [facility] to fax results over for Dr. Rockey Situ to review Ask if results can be uploaded to Care Everywhere as well

## 2020-11-04 ENCOUNTER — Other Ambulatory Visit: Payer: Self-pay | Admitting: Cardiovascular Disease

## 2020-11-18 ENCOUNTER — Ambulatory Visit (INDEPENDENT_AMBULATORY_CARE_PROVIDER_SITE_OTHER): Payer: Medicare Other

## 2020-11-18 ENCOUNTER — Other Ambulatory Visit: Payer: Self-pay

## 2020-11-18 DIAGNOSIS — R0600 Dyspnea, unspecified: Secondary | ICD-10-CM

## 2020-11-18 LAB — ECHOCARDIOGRAM COMPLETE
AR max vel: 2.34 cm2
AV Area VTI: 2.19 cm2
AV Area mean vel: 2.16 cm2
AV Mean grad: 3 mmHg
AV Peak grad: 6 mmHg
Ao pk vel: 1.22 m/s
Area-P 1/2: 4.54 cm2
Calc EF: 55.9 %
S' Lateral: 2.4 cm
Single Plane A2C EF: 57.5 %
Single Plane A4C EF: 52.4 %

## 2020-11-22 ENCOUNTER — Ambulatory Visit: Payer: Medicare Other | Admitting: Physician Assistant

## 2020-11-30 ENCOUNTER — Encounter (INDEPENDENT_AMBULATORY_CARE_PROVIDER_SITE_OTHER): Payer: Medicare Other

## 2020-11-30 ENCOUNTER — Other Ambulatory Visit: Payer: Self-pay | Admitting: Cardiovascular Disease

## 2020-11-30 ENCOUNTER — Ambulatory Visit (INDEPENDENT_AMBULATORY_CARE_PROVIDER_SITE_OTHER): Payer: Medicare Other | Admitting: Vascular Surgery

## 2020-12-17 ENCOUNTER — Ambulatory Visit (INDEPENDENT_AMBULATORY_CARE_PROVIDER_SITE_OTHER): Payer: Medicare Other | Admitting: Cardiovascular Disease

## 2020-12-17 ENCOUNTER — Other Ambulatory Visit: Payer: Self-pay

## 2020-12-17 ENCOUNTER — Encounter: Payer: Self-pay | Admitting: Cardiovascular Disease

## 2020-12-17 VITALS — BP 132/54 | HR 70 | Ht 60.0 in | Wt 121.4 lb

## 2020-12-17 DIAGNOSIS — I6523 Occlusion and stenosis of bilateral carotid arteries: Secondary | ICD-10-CM | POA: Diagnosis not present

## 2020-12-17 DIAGNOSIS — E1159 Type 2 diabetes mellitus with other circulatory complications: Secondary | ICD-10-CM

## 2020-12-17 DIAGNOSIS — R0989 Other specified symptoms and signs involving the circulatory and respiratory systems: Secondary | ICD-10-CM

## 2020-12-17 DIAGNOSIS — Z8673 Personal history of transient ischemic attack (TIA), and cerebral infarction without residual deficits: Secondary | ICD-10-CM | POA: Diagnosis not present

## 2020-12-17 DIAGNOSIS — I340 Nonrheumatic mitral (valve) insufficiency: Secondary | ICD-10-CM

## 2020-12-17 MED ORDER — HYDRALAZINE HCL 25 MG PO TABS: ORAL_TABLET | ORAL | 3 refills | Status: DC

## 2020-12-17 MED ORDER — AMLODIPINE BESYLATE 5 MG PO TABS: 5.0000 mg | ORAL_TABLET | Freq: Two times a day (BID) | ORAL | 3 refills | Status: DC

## 2020-12-17 MED ORDER — CLOPIDOGREL BISULFATE 75 MG PO TABS: 75.0000 mg | ORAL_TABLET | Freq: Every day | ORAL | 3 refills | Status: DC

## 2020-12-17 MED ORDER — METOPROLOL TARTRATE 25 MG PO TABS: 12.5000 mg | ORAL_TABLET | Freq: Two times a day (BID) | ORAL | 3 refills | Status: DC

## 2020-12-17 MED ORDER — ATORVASTATIN CALCIUM 10 MG PO TABS: 10.0000 mg | ORAL_TABLET | Freq: Every day | ORAL | 3 refills | Status: DC

## 2020-12-17 NOTE — Patient Instructions (Addendum)
Medication Instructions:  No changes  If you need a refill on your cardiac medications before your next appointment, please call your pharmacy.   Lab work: No new labs needed  Testing/Procedures: No new testing needed  Follow-Up: At [facility], you and your health needs are our priority.  As part of our continuing mission to provide you with exceptional heart care, we have created designated Provider Care Teams.  These Care Teams include your primary Cardiologist (physician) and Advanced Practice Providers (APPs -  Physician Assistants and Nurse Practitioners) who all work together to provide you with the care you need, when you need it.  You will need a follow up appointment in 12 months  Providers on your designated Care Team:   Christopher Berge, NP Ryan Dunn, PA-C Jacquelyn Visser, PA-C Cadence Furth, PA-C  COVID-19 Vaccine Information can be found at: [email or web address] For questions related to vaccine distribution or appointments, please email [email or web address] or call [phone].    

## 2020-12-17 NOTE — Progress Notes (Signed)
Cardiology Office Note  Date:  12/17/2020   ID:  Claire Washington, DOB 1925/01/01, MRN [id number]  PCP:  Claire Harrier, MD   Chief Complaint  Patient presents with   12 month follow up     Discuss Echo results. Medications reviewed by the patient verbally. "Doing well."     HPI:  Claire Washington  is a very pleasant 87 year-old woman with history of   hypertension, chronic dizziness, benign positional vertigo,  osteoarthritis,  hyperlipidemia,  carotid arterial disease (followed by Dr. Lucky Cowboy, 50% on the right),  chronic cystitis,  diabetes,  Long smoking history until age 20 Previous TIAs per the patient, CVA 2018 Carotid stenosis <39% b/l in 2019 Chronic  dizziness in her head, gait instability, for years, progressive who presents for routine followup of her dizziness, hypertension, TIA/CVAs  Last seen in clinic by myself August 2021  In the er 09/2020 generalized weakness and dizziness with standing that is been going on for about 4 months, worse in the last few weeks.    Stopped losartan , felt BP was too low, was "dizzy" Takes norvasc 2.5 in the Am and 5 mg in the PM Takes  metoprolol 12. To 25 BID Hydralazine 25 TID prn  No edema  Holter monitor reviewed from primary care, this shows predominant normal sinus rhythm, rare episodes of narrow complex tachycardia longest 6 beats, 3 beat run wide-complex  Echo 11/2020 reviewed in detail  1. Left ventricular ejection fraction, by estimation, is 70 to 75%. The  left ventricle has hyperdynamic function. The left ventricle has no  regional wall motion abnormalities. Left ventricular diastolic parameters  are consistent with Grade II diastolic  dysfunction (pseudonormalization).   2. Right ventricular systolic function is normal. The right ventricular  size is normal.   3. The mitral valve is degenerative. Trivial mitral valve regurgitation.   4. The aortic valve was not well visualized. Aortic valve regurgitation  is not  visualized.   5. The inferior vena cava is normal in size with greater than 50%  respiratory variability, suggesting right atrial pressure of 3 mmHg.    EKG personally reviewed by myself on todays visit Shows normal sinus rhythm rate 70 bpm no significant ST-T wave changes  Previously seen by neurology for chronic dizziness MRI brain without contrast done on 11/30/2017 which showed moderate atrophy and moderate microvascular ischemic changes. --old cerebellar stroke, microvascular ischemic changes, and inner ear issues: worsened.  Echo 08/2016 Normal LV function No significant valve disease No indication from the echo as to cause of dizziness  Event monitor  08/2016 Normal sinus rhythm Rare PVCs, <1% No other significant arrhythmia noted Symptoms of dizziness did not seem to correlate to underlying arrhythmia  Chronic equilibrium and dizziness dating back several years, worse recently Difficult time with equilibrium.  Working with PT   PMH:   has a past medical history of Arthritis, Basal cell carcinoma, Chronic cystitis, Diabetes mellitus type 2, controlled, without complications ([name]), Disease of thyroid gland, Embolic stroke ([name]), Follicular neoplasm of thyroid, Hemorrhage of rectum and anus, Hyperlipidemia, Hyperlipidemia, unspecified, Hypertension, Leg pain, Osteoarthritis, Pernicious anemia, Small vessel disease ([name]), Spinal stenosis, lumbar region without neurogenic claudication, Subdural hematoma ([name]), Syncope and collapse, Thyroid nodule, and Vertigo & Chronic Dizziness.  PSH:    Past Surgical History:  Procedure Laterality Date   BILATERAL SALPINGOOPHORECTOMY     BREAST CYST EXCISION     ? side, yrs ago   COLONOSCOPY  01/26/2009   Diverticulosis  CYSTOSCOPY     HEMORRHOIDECTOMY BY SIMPLE LIGATION     PR COLONOSCOPY FLX W/ENDOSCOPIC MUCOSAL RESECTION  09/02/2014   Procedure: COLONOSCOPY, FLEXIBLE; WITH ENDOSCOPIC MUCOSAL RESECTION; Surgeon: Saunders Revel, MD;  [facility]; Service: Gastroenterology   PR [place] FLX W/RMVL OF TUMOR POLYP LESION SNARE TQ  09/02/2014   Procedure: COLONOSCOPY FLEX; W/REMOV TUMOR/LES BY SNARE; Surgeon: Saunders Revel, MD; [facility]: [facility]; Service: Gastroenterology   SKIN SURGERY     TOTAL ABDOMINAL HYSTERECTOMY W/ BILATERAL SALPINGOOPHORECTOMY  1970s    Current Outpatient Medications  Medication Sig Dispense Refill   amLODipine (NORVASC) 5 MG tablet TAKE 1 TABLET BY MOUTH TWICE A DAY 180 tablet 1   atorvastatin (LIPITOR) 10 MG tablet TAKE 1 TABLET BY MOUTH EVERY DAY 90 tablet 0   busPIRone (BUSPAR) 5 MG tablet Take 5 mg by mouth 2 (two) times daily as needed.     clopidogrel (PLAVIX) 75 MG tablet TAKE 1 TABLET BY MOUTH EVERY DAY 90 tablet 0   hydrALAZINE (APRESOLINE) 25 MG tablet TAKE 1 TABLET BY MOUTH 3 TIMES DAILY AS NEEDED FOR SYSTOLIC BLOOD PRESSURE XX123456 90 tablet 1   metoprolol tartrate (LOPRESSOR) 25 MG tablet Take 0.5 tablets (12.5 mg total) by mouth 2 (two) times daily. Take an additional half tablet for top number of blood pressure 160 or greater and/or pulse 90 or greater. 90 tablet 3   Multiple Vitamins-Minerals (CENTRUM SILVER PO) Take 1 tablet by mouth daily. Reported on 06/11/2015     vitamin B-12 (CYANOCOBALAMIN) 1000 MCG tablet Take by mouth daily.      Vitamin D, Ergocalciferol, (DRISDOL) 1.25 MG (50000 UT) CAPS capsule Take 50,000 Units by mouth every 7 (seven) days.     No current facility-administered medications for this visit.    Allergies:   Latex, Hydrocortisone, Sulfa antibiotics, and Tape   Social History:  The patient  reports that she quit smoking about 27 years ago. Her smoking use included cigarettes. She has a 5.00 pack-year smoking history. She has never used smokeless tobacco. She reports current alcohol use of about 2.0 standard drinks per week. She reports that she does not use drugs.   Family History:   family history includes Asthma in  her daughter; Colon cancer in her mother; Diabetes in an other family member; Heart attack in her brother and father; Heart disease in her father; Prostate cancer in her father; Stroke in her father.   Review of Systems  Constitutional: Negative.   HENT: Negative.    Respiratory: Negative.    Cardiovascular: Negative.   Gastrointestinal: Negative.   Musculoskeletal: Negative.   Neurological:  Positive for dizziness.  Psychiatric/Behavioral: Negative.    All other systems reviewed and are negative.  PHYSICAL EXAM: VS:  BP (!) 132/54 (BP Location: Left Arm, Patient Position: Sitting, Cuff Size: Normal)   Pulse 70   Ht 5' (1.524 m)   Wt 121 lb 6 oz (55.1 kg)   SpO2 96%   BMI 23.70 kg/m  , BMI Body mass index is 23.7 kg/m. Constitutional:  oriented to person, place, and time. No distress.  HENT:  Head: Grossly normal Eyes:  no discharge. No scleral icterus.  Neck: No JVD, no carotid bruits  Cardiovascular: Regular rate and rhythm, no murmurs appreciated Pulmonary/Chest: Clear to auscultation bilaterally, no wheezes or rails Abdominal: Soft.  no distension.  no tenderness.  Musculoskeletal: Normal range of motion Neurological:  normal muscle tone. Coordination normal. No atrophy Skin: Skin  warm and dry Psychiatric: normal affect, pleasant  Recent Labs: 09/29/2020: ALT 14; BUN 10; Creatinine, Ser 0.71; Hemoglobin 15.4; Magnesium 2.1; Platelets 198; Potassium 4.2; Sodium 140; TSH 2.545    Lipid Panel No results found for: CHOL, HDL, LDLCALC, TRIG    Wt Readings from Last 3 Encounters:  12/17/20 121 lb 6 oz (55.1 kg)  09/29/20 118 lb (53.5 kg)  09/28/20 120 lb 3.2 oz (54.5 kg)     ASSESSMENT AND PLAN:  Other secondary hypertension She reports feeling better off the losartan Will continue her other medications as detailed above Hydralazine PRN  Dizziness Chronic issue MRI showing  Strokes, microvascular disease  followed by neurology  Stroke Multiple embolic  strokes on MRI moderate carotid disease on the right followed by Dr. Lucky Cowboy On Plavix, statin/ Lipitor No recent events  Smoking hx Currently a nonsmoker  Type 2 diabetes mellitus with other circulatory complication, without long-term current use of insulin ([place]) Recommend exercise program for stability  Mixed hyperlipidemia Goal LDL less than 70, continue Lipitor    Total encounter time more than 25 minutes  Greater than 50% was spent in counseling and coordination of care with the patient   Orders Placed This Encounter  Procedures   EKG 12-Lead     Signed, Esmond Plants, M.D., Ph.D. 12/17/2020  [facility] [place], [place] [phone]

## 2020-12-28 ENCOUNTER — Other Ambulatory Visit: Payer: Self-pay

## 2020-12-28 ENCOUNTER — Ambulatory Visit (INDEPENDENT_AMBULATORY_CARE_PROVIDER_SITE_OTHER): Payer: Medicare Other

## 2020-12-28 ENCOUNTER — Ambulatory Visit (INDEPENDENT_AMBULATORY_CARE_PROVIDER_SITE_OTHER): Payer: Medicare Other | Admitting: Vascular Surgery

## 2020-12-28 VITALS — BP 157/69 | HR 75 | Ht 60.0 in | Wt 123.0 lb

## 2020-12-28 DIAGNOSIS — I890 Lymphedema, not elsewhere classified: Secondary | ICD-10-CM

## 2020-12-28 DIAGNOSIS — E871 Hypo-osmolality and hyponatremia: Secondary | ICD-10-CM | POA: Diagnosis not present

## 2020-12-28 DIAGNOSIS — E1159 Type 2 diabetes mellitus with other circulatory complications: Secondary | ICD-10-CM | POA: Diagnosis not present

## 2020-12-28 DIAGNOSIS — I6523 Occlusion and stenosis of bilateral carotid arteries: Secondary | ICD-10-CM

## 2020-12-28 DIAGNOSIS — I10 Essential (primary) hypertension: Secondary | ICD-10-CM | POA: Diagnosis not present

## 2020-12-28 NOTE — Assessment & Plan Note (Signed)
Carotid duplex reveals stable 1 to 39% ICA stenosis bilaterally.  No role for intervention.  Recheck in 1 year.

## 2020-12-28 NOTE — Progress Notes (Signed)
MRN : [id number]  Claire Washington is a 87 y.o. (03-05-1925) female who presents with chief complaint of  Chief Complaint  Patient presents with   Follow-up    3 Mo [place]  .  History of Present Illness: Patient returns in follow-up.  She is doing well today.  Her swelling is stable and has not worsened even though she admittedly is not really using her lymphedema pump for compression very much.  No focal neurologic symptoms.  Carotid duplex reveals stable 1 to 39% ICA stenosis bilaterally.  Current Outpatient Medications  Medication Sig Dispense Refill   amLODipine (NORVASC) 5 MG tablet Take 1 tablet (5 mg total) by mouth 2 (two) times daily. 180 tablet 3   atorvastatin (LIPITOR) 10 MG tablet Take 1 tablet (10 mg total) by mouth daily. 90 tablet 3   busPIRone (BUSPAR) 5 MG tablet Take 5 mg by mouth 2 (two) times daily as needed.     clopidogrel (PLAVIX) 75 MG tablet Take 1 tablet (75 mg total) by mouth daily. 90 tablet 3   hydrALAZINE (APRESOLINE) 25 MG tablet TAKE 1 TABLET BY MOUTH 3 TIMES DAILY AS NEEDED FOR SYSTOLIC BLOOD PRESSURE [id number] 90 tablet 3   metoprolol tartrate (LOPRESSOR) 25 MG tablet Take 0.5 tablets (12.5 mg total) by mouth 2 (two) times daily. Take an additional half tablet for top number of blood pressure 160 or greater and/or pulse 90 or greater. 90 tablet 3   Multiple Vitamins-Minerals (CENTRUM SILVER PO) Take 1 tablet by mouth daily. Reported on 06/11/2015     vitamin B-12 (CYANOCOBALAMIN) 1000 MCG tablet Take by mouth daily.      Vitamin D, Ergocalciferol, (DRISDOL) 1.25 MG (50000 UT) CAPS capsule Take 50,000 Units by mouth every 7 (seven) days.     No current facility-administered medications for this visit.    Past Medical History:  Diagnosis Date   Arthritis    Basal cell carcinoma    basel cell   Chronic cystitis    Diabetes mellitus type 2, controlled, without complications ([place])    type 2 or unspecified type diabetes mellitus without mention of  complication, not stated as uncontrolled   Disease of thyroid gland    Embolic stroke ([place])    a. 07/2016 MRI Brain: small right superior cerebellum and left lateral occipital cortex infarcts;  b. 08/2016 Echo: EF 60-65%, Gr1 DD, mild MR;  c. 08/2016 30 Day Event Monitor: No significant tachy/bradyarrhythmias. No afib.   Follicular neoplasm of thyroid    Hemorrhage of rectum and anus    Hyperlipidemia    Hyperlipidemia, unspecified    Hypertension    Leg pain    Osteoarthritis    Pernicious anemia    Small vessel disease ([place])    Spinal stenosis, lumbar region without neurogenic claudication    Subdural hematoma ([place])    a. 2013 in setting of syncope.   Syncope and collapse    a. 2013 - syncope and fall w/ resultant subdural hematoma; b. prev nl echo and stress test @ [facility].   Thyroid nodule    Vertigo & Chronic Dizziness    a. Previously seen by ENT with Physical Therapy.  MRI 2000 small vessel disease    Past Surgical History:  Procedure Laterality Date   BILATERAL SALPINGOOPHORECTOMY     BREAST CYST EXCISION     ? side, yrs ago   COLONOSCOPY  01/26/2009   Diverticulosis   CYSTOSCOPY     HEMORRHOIDECTOMY BY SIMPLE LIGATION  PR COLONOSCOPY FLX W/ENDOSCOPIC MUCOSAL RESECTION  09/02/2014   Procedure: COLONOSCOPY, FLEXIBLE; WITH ENDOSCOPIC MUCOSAL RESECTION; Surgeon: Saunders Revel, MD; [facility]; Service: Gastroenterology   PR [place] FLX W/RMVL OF TUMOR POLYP LESION SNARE TQ  09/02/2014   Procedure: COLONOSCOPY FLEX; W/REMOV TUMOR/LES BY SNARE; Surgeon: Saunders Revel, MD; [facility]; Service: Gastroenterology   SKIN SURGERY     TOTAL ABDOMINAL HYSTERECTOMY W/ BILATERAL SALPINGOOPHORECTOMY  1970s     Social History   Tobacco Use   Smoking status: Former    Packs/day: 0.25    Years: 20.00    Pack years: 5.00    Types: Cigarettes    Quit date: 09/15/1993    Years since quitting: 27.3   Smokeless tobacco:  Never  Vaping Use   Vaping Use: Never used  Substance Use Topics   Alcohol use: Yes    Alcohol/week: 2.0 standard drinks    Types: 1 Glasses of wine, 1 Shots of liquor per week    Comment: occassional   Drug use: No      Family History  Problem Relation Age of Onset   Colon cancer Mother    Heart disease Father    Heart attack Father    Prostate cancer Father    Stroke Father    Heart attack Brother    Asthma Daughter    Diabetes Other        Siblings   Breast cancer Neg Hx      Allergies  Allergen Reactions   Latex Dermatitis   Hydrocortisone Swelling   Sulfa Antibiotics Other (See Comments)    Reaction: unknown   Tape Dermatitis      REVIEW OF SYSTEMS (Negative unless checked)   Constitutional: '[]'$ Weight loss  '[]'$ Fever  '[]'$ Chills Cardiac: '[]'$ Chest pain   '[]'$ Chest pressure   '[]'$ Palpitations   '[]'$ Shortness of breath when laying flat   '[]'$ Shortness of breath at rest   '[]'$ Shortness of breath with exertion. Vascular:  '[]'$ Pain in legs with walking   '[]'$ Pain in legs at rest   '[]'$ Pain in legs when laying flat   '[]'$ Claudication   '[]'$ Pain in feet when walking  '[]'$ Pain in feet at rest  '[]'$ Pain in feet when laying flat   '[]'$ History of DVT   '[]'$ Phlebitis   '[]'$ Swelling in legs   '[]'$ Varicose veins   '[]'$ Non-healing ulcers Pulmonary:   '[]'$ Uses home oxygen   '[]'$ Productive cough   '[]'$ Hemoptysis   '[]'$ Wheeze  '[]'$ COPD   '[]'$ Asthma Neurologic:  '[x]'$ Dizziness  '[]'$ Blackouts   '[]'$ Seizures   '[]'$ History of stroke   '[]'$ History of TIA  '[]'$ Aphasia   '[]'$ Temporary blindness   '[]'$ Dysphagia   '[]'$ Weakness or numbness in arms   '[]'$ Weakness or numbness in legs Musculoskeletal:  '[x]'$ Arthritis   '[]'$ Joint swelling   '[]'$ Joint pain   '[x]'$ Low back pain Hematologic:  '[]'$ Easy bruising  '[]'$ Easy bleeding   '[]'$ Hypercoagulable state   '[x]'$ Anemic  '[]'$ Hepatitis Gastrointestinal:  '[]'$ Blood in stool   '[]'$ Vomiting blood  '[]'$ Gastroesophageal reflux/heartburn   '[]'$ Difficulty swallowing. Genitourinary:  '[]'$ Chronic kidney disease   '[]'$ Difficult urination  '[]'$ Frequent urination   '[]'$ Burning with urination   '[]'$ Blood in urine Skin:  '[]'$ Rashes   '[]'$ Ulcers   '[]'$ Wounds Psychological:  '[]'$ History of anxiety   '[]'$  History of major depression.  Physical Examination  Vitals:   12/28/20 1338  BP: (!) 157/69  Pulse: 75  Weight: 123 lb (55.8 kg)  Height: 5' (1.524 m)   Body mass index is 24.02 kg/m. Gen:  WD/WN, NAD. Appears younger  than stated age. Head: [place]/AT, No temporalis wasting. Ear/Nose/Throat: Hearing grossly intact, nares w/o erythema or drainage, trachea midline Eyes: Conjunctiva clear. Sclera non-icteric Neck: Supple.  No bruit  Pulmonary:  Good air movement, equal and clear to auscultation bilaterally.  Cardiac: RRR, No JVD Vascular:  Vessel Right Left  Radial Palpable Palpable           Musculoskeletal: M/S 5/5 throughout.  No deformity or atrophy. 1+ RLE edema, trace LLE edema. Neurologic: CN 2-12 intact. Sensation grossly intact in extremities.  Symmetrical.  Speech is fluent. Motor exam as listed above. Psychiatric: Judgment intact, Mood & affect appropriate for pt's clinical situation. Dermatologic: No rashes or ulcers noted.  No cellulitis or open wounds.     CBC Lab Results  Component Value Date   WBC 4.7 09/29/2020   HGB 15.4 (H) 09/29/2020   HCT 45.4 09/29/2020   MCV 86.6 09/29/2020   PLT 198 09/29/2020    BMET    Component Value Date/Time   NA 140 09/29/2020 0927   NA 140 07/26/2013 1025   K 4.2 09/29/2020 0927   K 4.4 07/26/2013 1025   CL 102 09/29/2020 0927   CL 107 07/26/2013 1025   CO2 28 09/29/2020 0927   CO2 28 07/26/2013 1025   GLUCOSE 132 (H) 09/29/2020 0927   GLUCOSE 118 (H) 07/26/2013 1025   BUN 10 09/29/2020 0927   BUN 21 (H) 07/26/2013 1025   CREATININE 0.71 09/29/2020 0927   CREATININE 0.95 07/26/2013 1025   CALCIUM 9.0 09/29/2020 0927   CALCIUM 9.3 07/26/2013 1025   GFRNONAA >60 09/29/2020 0927   GFRNONAA 54 (L) 07/26/2013 1025   GFRAA >60 12/30/2018 0358   GFRAA >60 07/26/2013 1025   CrCl cannot be  calculated (Patient's most recent lab result is older than the maximum 21 days allowed.).  COAG Lab Results  Component Value Date   INR 0.91 10/13/2016    Radiology No results found.   Assessment/Plan Hypertension blood pressure control important in reducing the progression of atherosclerotic disease. On appropriate oral medications.     Diabetes mellitus blood glucose control important in reducing the progression of atherosclerotic disease. Also, involved in wound healing. On appropriate medications.   Lymphedema Some swelling but admittedly is not using her lymphedema pump all that much.  If her swelling worsens, she really needs to use this more regularly.   Hyponatremia Electrolyte imbalances can certainly worsen her lower extremity swelling  Carotid stenosis Carotid duplex reveals stable 1 to 39% ICA stenosis bilaterally.  No role for intervention.  Recheck in 1 year.    Leotis Pain, MD  12/28/2020 2:16 PM    This note was created with Dragon medical transcription system.  Any errors from dictation are purely unintentional

## 2021-03-07 ENCOUNTER — Other Ambulatory Visit: Payer: Self-pay

## 2021-03-07 ENCOUNTER — Emergency Department
Admission: EM | Admit: 2021-03-07 | Discharge: 2021-03-07 | Disposition: A | Discharge status: Home / Self Care | Payer: Medicare Other | Attending: Emergency Medicine | Admitting: Emergency Medicine

## 2021-03-07 DIAGNOSIS — Z87891 Personal history of nicotine dependence: Secondary | ICD-10-CM | POA: Diagnosis not present

## 2021-03-07 DIAGNOSIS — Z85828 Personal history of other malignant neoplasm of skin: Secondary | ICD-10-CM | POA: Diagnosis not present

## 2021-03-07 DIAGNOSIS — Z79899 Other long term (current) drug therapy: Secondary | ICD-10-CM | POA: Diagnosis not present

## 2021-03-07 DIAGNOSIS — Z91040 Latex allergy status: Secondary | ICD-10-CM | POA: Diagnosis not present

## 2021-03-07 DIAGNOSIS — R42 Dizziness and giddiness: Secondary | ICD-10-CM | POA: Insufficient documentation

## 2021-03-07 DIAGNOSIS — I10 Essential (primary) hypertension: Secondary | ICD-10-CM | POA: Diagnosis not present

## 2021-03-07 DIAGNOSIS — E119 Type 2 diabetes mellitus without complications: Secondary | ICD-10-CM | POA: Diagnosis not present

## 2021-03-07 DIAGNOSIS — E860 Dehydration: Secondary | ICD-10-CM | POA: Diagnosis present

## 2021-03-07 DIAGNOSIS — R5383 Other fatigue: Secondary | ICD-10-CM | POA: Insufficient documentation

## 2021-03-07 LAB — CBC
HCT: 46.2 % — ABNORMAL HIGH (ref 36.0–46.0)
Hemoglobin: 15.6 g/dL — ABNORMAL HIGH (ref 12.0–15.0)
MCH: 29.3 pg (ref 26.0–34.0)
MCHC: 33.8 g/dL (ref 30.0–36.0)
MCV: 86.7 fL (ref 80.0–100.0)
Platelets: 186 10*3/uL (ref 150–400)
RBC: 5.33 MIL/uL — ABNORMAL HIGH (ref 3.87–5.11)
RDW: 13.2 % (ref 11.5–15.5)
WBC: 7.3 10*3/uL (ref 4.0–10.5)
nRBC: 0 % (ref 0.0–0.2)

## 2021-03-07 LAB — URINALYSIS, ROUTINE W REFLEX MICROSCOPIC
Bacteria, UA: NONE SEEN
Bilirubin Urine: NEGATIVE
Glucose, UA: NEGATIVE mg/dL
Ketones, ur: NEGATIVE mg/dL
Leukocytes,Ua: NEGATIVE
Nitrite: NEGATIVE
Protein, ur: NEGATIVE mg/dL
Specific Gravity, Urine: 1.004 — ABNORMAL LOW (ref 1.005–1.030)
Squamous Epithelial / HPF: NONE SEEN (ref 0–5)
pH: 6 (ref 5.0–8.0)

## 2021-03-07 LAB — BASIC METABOLIC PANEL
Anion gap: 6 (ref 5–15)
BUN: 13 mg/dL (ref 8–23)
CO2: 28 mmol/L (ref 22–32)
Calcium: 9 mg/dL (ref 8.9–10.3)
Chloride: 103 mmol/L (ref 98–111)
Creatinine, Ser: 0.57 mg/dL (ref 0.44–1.00)
GFR, Estimated: >60 mL/min (ref >60)
Glucose, Bld: 131 mg/dL — ABNORMAL HIGH (ref 70–99)
Potassium: 4.6 mmol/L (ref 3.5–5.1)
Sodium: 137 mmol/L (ref 135–145)

## 2021-03-07 MED ORDER — SODIUM CHLORIDE 0.9 % IV BOLUS
500.0000 mL | Freq: Once | INTRAVENOUS | Status: AC
  Administered 2021-03-07: 500 mL via INTRAVENOUS

## 2021-03-07 NOTE — ED Notes (Signed)
Pt able to ambulate to commode with walker.

## 2021-03-07 NOTE — ED Notes (Signed)
Pt denies any CP but states little SOB

## 2021-03-07 NOTE — ED Triage Notes (Signed)
Pt come with c/o weakness, faintness, hypertension and dehydration. Pt states this has been going on for few days.

## 2021-03-07 NOTE — ED Notes (Signed)
Pt to ED for weakness that has been going on for a couple weeks. Pt states she also has had HTN, dizziness, and Nausea from "dehydration". Pt has hx of HTN.  Denies syncopal and any recent falls.   Pt is A&Ox4, NAD.

## 2021-03-07 NOTE — Discharge Instructions (Signed)
As we discussed, though you do have high blood pressure (hypertension), fortunately it is not immediately dangerous at this time and does not need emergency intervention or admission to the hospital.  If we add to or change your regular medications, we may cause more harm than good - it is more appropriate for your primary care doctor to evaluate you in clinic and decide if any medication changes are needed.  Please follow up in clinic as recommended in these papers. ° °Return to the Emergency Department (ED) if you experience any chest pain/pressure/tightness, difficulty breathing, or sudden sweating, or other symptoms that concern you. ° °

## 2021-03-07 NOTE — ED Provider Notes (Signed)
[facility] Emergency Department Provider Note   ____________________________________________   Event Date/Time   First MD Initiated Contact with Patient 03/07/21 1511     (approximate)  I have reviewed the triage vital signs and the nursing notes.   HISTORY  Chief Complaint Dehydration    HPI Claire Washington is a 87 y.o. female with a history of type 2 diabetes previous stroke, hyperlipidemia hypertension anemia subdural hematoma  Patient reports about 4 times a year she will get "dehydrated".  This will consist of her feeling lightheaded, thirsty, and she will try to drink coconut water but will have to come to the emergency department to get IV fluids.  Also when this happens her blood pressure tends to be high and she will sometimes have to take her as needed medicine  The last 2 weeks she is experienced this feeling of dryness, feels like she is dehydrated.  A little bit lightheaded at times.  No chest pains no trouble breathing no fever no cough.  No abdominal pain.  Feel like when she drinks fluids she drink it and then no go right through her, she will often have a loose stool after a meal.  No black or bloody stool.  No abnormal stool odor.   Past Medical History:  Diagnosis Date   Arthritis    Basal cell carcinoma    basel cell   Chronic cystitis    Diabetes mellitus type 2, controlled, without complications ([place])    type 2 or unspecified type diabetes mellitus without mention of complication, not stated as uncontrolled   Disease of thyroid gland    Embolic stroke ([place])    a. 07/2016 MRI Brain: small right superior cerebellum and left lateral occipital cortex infarcts;  b. 08/2016 Echo: EF 60-65%, Gr1 DD, mild MR;  c. 08/2016 30 Day Event Monitor: No significant tachy/bradyarrhythmias. No afib.   Follicular neoplasm of thyroid    Hemorrhage of rectum and anus    Hyperlipidemia    Hyperlipidemia, unspecified    Hypertension    Leg pain     Osteoarthritis    Pernicious anemia    Small vessel disease ([place])    Spinal stenosis, lumbar region without neurogenic claudication    Subdural hematoma    a. 2013 in setting of syncope.   Syncope and collapse    a. 2013 - syncope and fall w/ resultant subdural hematoma; b. prev nl echo and stress test @ [facility].   Thyroid nodule    Vertigo & Chronic Dizziness    a. Previously seen by ENT with Physical Therapy.  MRI 2000 small vessel disease    Patient Active Problem List   Diagnosis Date Noted   Hyponatremia 08/31/2020   Lymphedema 03/09/2019   Swelling of limb 02/28/2019   Cellulitis of left leg 12/31/2018   Hip pain, bilateral 12/31/2018   Cellulitis 12/27/2018   Carotid stenosis 12/28/2017   Vertigo 11/30/2017   Imbalance due to old stroke 11/03/2016   Gait instability 10/19/2016   Ataxia 08/30/2016   History of stroke 08/30/2016   Dizziness 11/03/2015   Abnormal PET scan of colon 06/18/2014   H/O seasonal allergies 06/04/2014   Lung nodule, solitary 31/51/7616   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   Hypertension 12/04/2011   Hyperlipidemia 12/04/2011   Syncope 12/04/2011   Diabetes mellitus ([place]) 12/04/2011   Smoking hx 12/04/2011    Past Surgical History:  Procedure Laterality Date   BILATERAL SALPINGOOPHORECTOMY  BREAST CYST EXCISION     ? side, yrs ago   COLONOSCOPY  01/26/2009   Diverticulosis   CYSTOSCOPY     HEMORRHOIDECTOMY BY SIMPLE LIGATION     PR COLONOSCOPY FLX W/ENDOSCOPIC MUCOSAL RESECTION  09/02/2014   Procedure: COLONOSCOPY, FLEXIBLE; WITH ENDOSCOPIC MUCOSAL RESECTION; Surgeon: Saunders Revel, MD; Location: [facility]; Service: Gastroenterology   PR [place] FLX W/RMVL OF TUMOR POLYP LESION SNARE TQ  09/02/2014   Procedure: COLONOSCOPY FLEX; W/REMOV TUMOR/LES BY SNARE; Surgeon: Saunders Revel, MD; Location: [facility]; Service: Gastroenterology   SKIN SURGERY     TOTAL ABDOMINAL HYSTERECTOMY  W/ BILATERAL SALPINGOOPHORECTOMY  1970s    Prior to Admission medications   Medication Sig Start Date End Date Taking? Authorizing Provider  amLODipine (NORVASC) 5 MG tablet Take 1 tablet (5 mg total) by mouth 2 (two) times daily. 12/17/20   Minna Merritts, MD  atorvastatin (LIPITOR) 10 MG tablet Take 1 tablet (10 mg total) by mouth daily. 12/17/20   Minna Merritts, MD  busPIRone (BUSPAR) 5 MG tablet Take 5 mg by mouth 2 (two) times daily as needed. 12/10/20   [provider]  clopidogrel (PLAVIX) 75 MG tablet Take 1 tablet (75 mg total) by mouth daily. 12/17/20   Minna Merritts, MD  hydrALAZINE (APRESOLINE) 25 MG tablet TAKE 1 TABLET BY MOUTH 3 TIMES DAILY AS NEEDED FOR SYSTOLIC BLOOD PRESSURE >638 12/17/20   Minna Merritts, MD  metoprolol tartrate (LOPRESSOR) 25 MG tablet Take 0.5 tablets (12.5 mg total) by mouth 2 (two) times daily. Take an additional half tablet for top number of blood pressure 160 or greater and/or pulse 90 or greater. 12/17/20 12/12/21  Minna Merritts, MD  Multiple Vitamins-Minerals (CENTRUM SILVER PO) Take 1 tablet by mouth daily. Reported on 06/11/2015    [provider]  vitamin B-12 (CYANOCOBALAMIN) 1000 MCG tablet Take by mouth daily.     [provider]  Vitamin D, Ergocalciferol, (DRISDOL) 1.25 MG (50000 UT) CAPS capsule Take 50,000 Units by mouth every 7 (seven) days.    [provider]    Allergies Latex, Hydrocortisone, Sulfa antibiotics, and Tape  Family History  Problem Relation Age of Onset   Colon cancer Mother    Heart disease Father    Heart attack Father    Prostate cancer Father    Stroke Father    Heart attack Brother    Asthma Daughter    Diabetes Other        Siblings   Breast cancer Neg Hx     Social History Social History   Tobacco Use   Smoking status: Former    Packs/day: 0.25    Years: 20.00    Pack years: 5.00    Types: Cigarettes    Quit date: 09/15/1993    Years since quitting: 27.4    Smokeless tobacco: Never  Vaping Use   Vaping Use: Never used  Substance Use Topics   Alcohol use: Yes    Alcohol/week: 2.0 standard drinks    Types: 1 Glasses of wine, 1 Shots of liquor per week    Comment: occassional   Drug use: No    Review of Systems Constitutional: No fever/chills Eyes: No visual changes. ENT: No sore throat.  Feels dry Cardiovascular: Denies chest pain. Respiratory: Denies shortness of breath. Gastrointestinal: No abdominal pain.  Continues to eat and drink well, often drinking coconut water Genitourinary: Negative for dysuria. Musculoskeletal: Negative for back pain. Skin:  Negative for rash. Neurological: Negative for headaches, areas of focal weakness or numbness.  Feeling a lightheaded the last couple of days    ____________________________________________   PHYSICAL EXAM:  VITAL SIGNS: ED Triage Vitals  Enc Vitals Group     BP 03/07/21 1208 (!) 158/61     Pulse Rate 03/07/21 1208 66     Resp 03/07/21 1208 18     Temp 03/07/21 1208 98.1 F (36.7 C)     Temp src --      SpO2 03/07/21 1208 94 %     Weight --      Height --      Head Circumference --      Peak Flow --      Pain Score 03/07/21 1207 0     Pain Loc --      Pain Edu? --      Excl. in [place]? --     Constitutional: Alert and oriented. Well appearing and in no acute distress.  Pleasant.  Accompanied by her family. Eyes: Conjunctivae are normal. Head: Atraumatic. Nose: No congestion/rhinnorhea. Mouth/Throat: Mucous membranes are slightly dry. Neck: No stridor.  Cardiovascular: Normal rate, regular rhythm. Grossly normal heart sounds.  Good peripheral circulation. Respiratory: Normal respiratory effort.  No retractions. Lungs CTAB. Gastrointestinal: Soft and nontender. No distention.  No rebound or guarding. Musculoskeletal: No lower extremity tenderness nor edema.  She reports normally she has some swelling and lymphedema but lately her legs does not look swollen at all which  make she is her think that she may need some fluid as well Neurologic:  Normal speech and language. No gross focal neurologic deficits are appreciated.  Normal pleasant conversant with normal speech pattern orientation.  Normal cranial nerves. Skin:  Skin is warm, dry and intact. No rash noted. Psychiatric: Mood and affect are normal. Speech and behavior are normal.  She demonstrates 5/5 strength in all extremities.  ____________________________________________   LABS (all labs ordered are listed, but only abnormal results are displayed)  Labs Reviewed  BASIC METABOLIC PANEL - Abnormal; Notable for the following components:      Result Value   Glucose, Bld 131 (*)    All other components within normal limits  CBC - Abnormal; Notable for the following components:   RBC 5.33 (*)    Hemoglobin 15.6 (*)    HCT 46.2 (*)    All other components within normal limits  URINALYSIS, ROUTINE W REFLEX MICROSCOPIC - Abnormal; Notable for the following components:   Color, Urine STRAW (*)    APPearance CLEAR (*)    Specific Gravity, Urine 1.004 (*)    Hgb urine dipstick SMALL (*)    All other components within normal limits  CBG MONITORING, ED   ____________________________________________  EKG  Reviewed inter by me at 1220 Heart rate 79 QRS 79 QTc 429 normal sinus rhythm mild baseline artifact.  No evidence of acute ischemia ____________________________________________  RADIOLOGY   ____________________________________________   PROCEDURES  Procedure(s) performed: None  Procedures  Critical Care performed: No  ____________________________________________   INITIAL IMPRESSION / ASSESSMENT AND PLAN / ED COURSE  Pertinent labs & imaging results that were available during my care of the patient were reviewed by me and considered in my medical decision making (see chart for details).   Patient comes for evaluation of feeling of dehydration, dryness of mouth, and reports normally  she has lower extremity edema which has gone away.  She is had several periods of this happen over  the last several years and reports that she had to come to the ER about 4 times in the year for similar or exact same symptoms.  She denies any infectious symptoms.  Does report loose stool after eating and drinking, but does not sound overtly infectious.  When asked she and her family reports she had cardiac evaluation, also has had stool studies before when this is happened, and she reports she is talked to both Dr. Ginette Pitman and her cardiologist about the same and no one seems to know why this happens.  She does use her as needed hydralazine which she took last night as her blood pressures will occasionally register in the 1 29-5 80 systolic region over the last 5 or so days but sometimes she will have blood pressure readings that are normal as well and she only writes down and records those that are abnormal  Overall very reassuring clinical examination.  [place] slightly dry with regard to mucous membranes diminishment of her typical peripheral edema suggestive of possible intravascular depletion but no hypotension.  Normal neurologic exam no cardiac or pulmonary symptoms.  Eating and drinking reports occasional loose stool but does not sound overtly infectious with no associated abdominal pain.  Plan to check orthostatics, hydrate, reassess for improvement as she reports typically symptomatic improvement after fluids and hydration      Vitals:   03/07/21 1600 03/07/21 1700  BP: (!) 166/59 (!) 169/55  Pulse: 79 77  Resp: 20 18  Temp:    SpO2: 96% 98%    ----------------------------------------- 6:13 PM on 03/07/2021 ----------------------------------------- Patient request to be discharged.  Is completed fluids, blood pressure monitored.  She is comfortable to plan to follow-up closely with Dr. Rockey Situ regarding her blood pressure, and at this point she has an as needed hydralazine as well as her  daily medications at home.  No evidence of hypertensive emergency noted at this time.  We do lengthy discussion about her symptomatology return precautions and recommendations and in some degree I feel like her own stress about her blood pressure may be driving her some of her hypertension and a whitecoat type manner  Patient very pleasant, she and her daughter both very gracious understanding and would like to be able to go home and follow-up closely with Dr. Rockey Situ.  I think is quite acceptable at this time.  She sitting up without difficulty, no longer feeling lightheaded but continues to have concerns around her blood pressure which I think might be quite variable and somewhat hard to make a definitive change in her medication regimen based on her visit today   Return precautions and treatment recommendations and follow-up discussed with the patient who is agreeable with the plan.   ____________________________________________   FINAL CLINICAL IMPRESSION(S) / ED DIAGNOSES  Final diagnoses:  Primary hypertension  Fatigue, unspecified type        Note:  This document was prepared using Dragon voice recognition software and may include unintentional dictation errors       Delman Kitten, MD 03/07/21 1814

## 2021-03-16 ENCOUNTER — Ambulatory Visit: Payer: Medicare Other | Admitting: Medical

## 2021-03-30 ENCOUNTER — Ambulatory Visit: Payer: Medicare Other | Admitting: Cardiovascular Disease

## 2021-04-09 IMAGING — US US EXTREM LOW VENOUS
1 series · 13 of 24 positions shown · non-contrast
Comparison: September 16, 2015.

CLINICAL DATA: Bilateral lower extremity edema.



[Series 1: us extrem low venous · 13 of 54 slices shown]
[im 1/54]
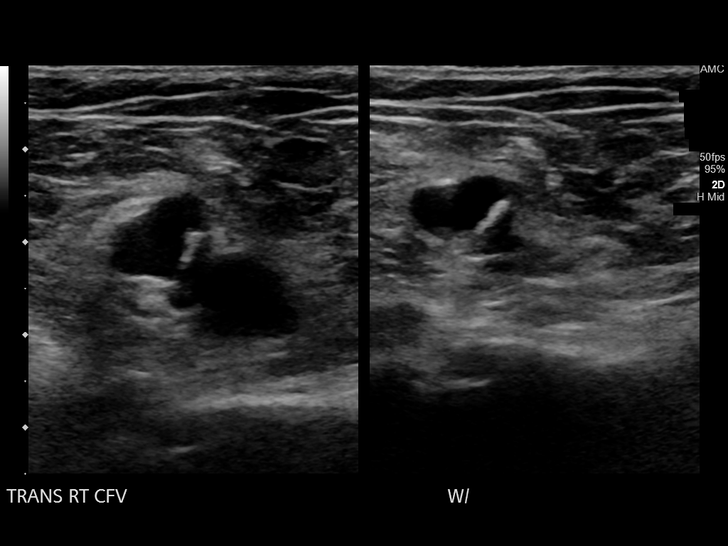
[im 5/54]
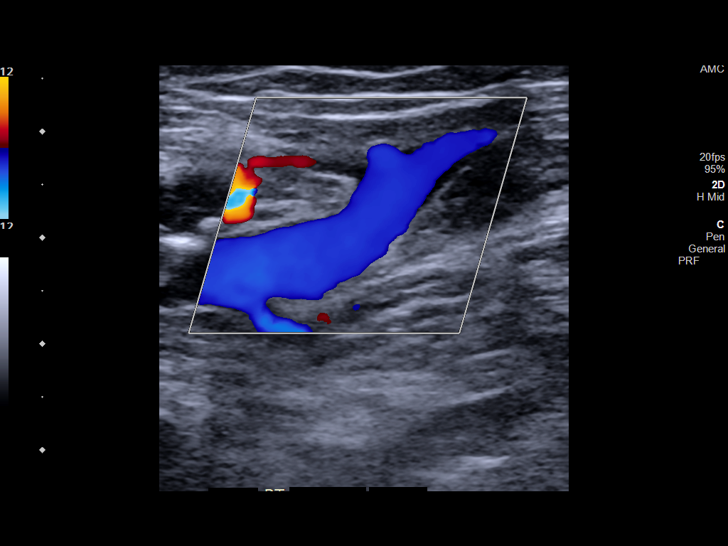
[im 10/54]
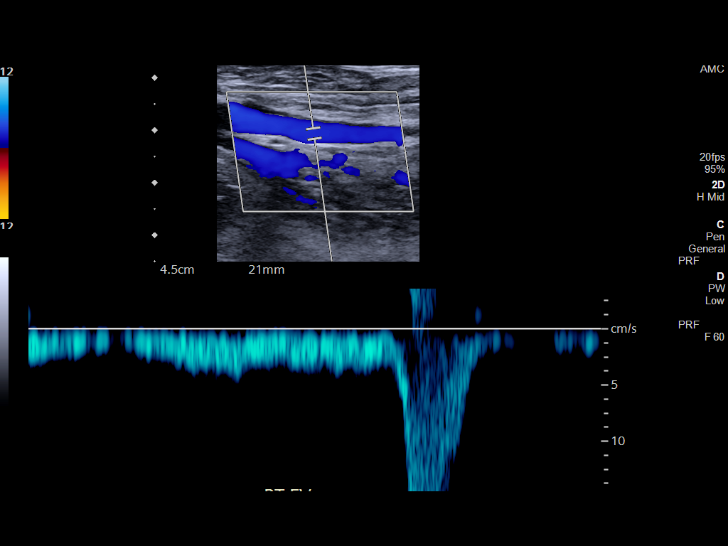
[im 14/54]
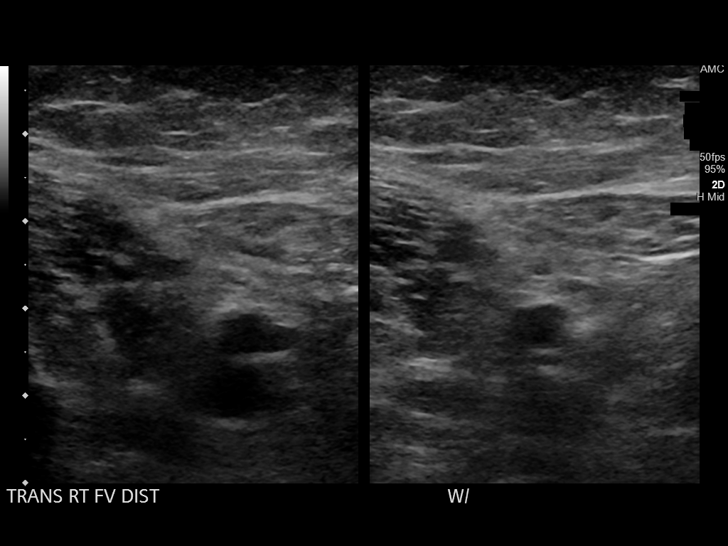
[im 19/54]
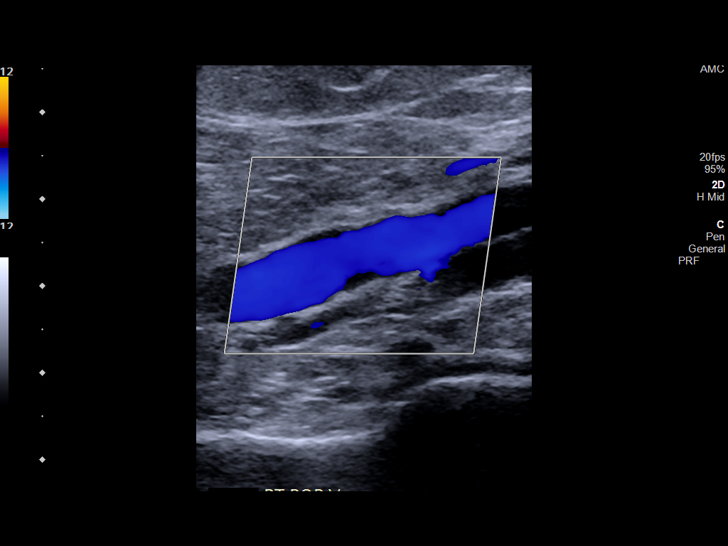
[im 24/54]
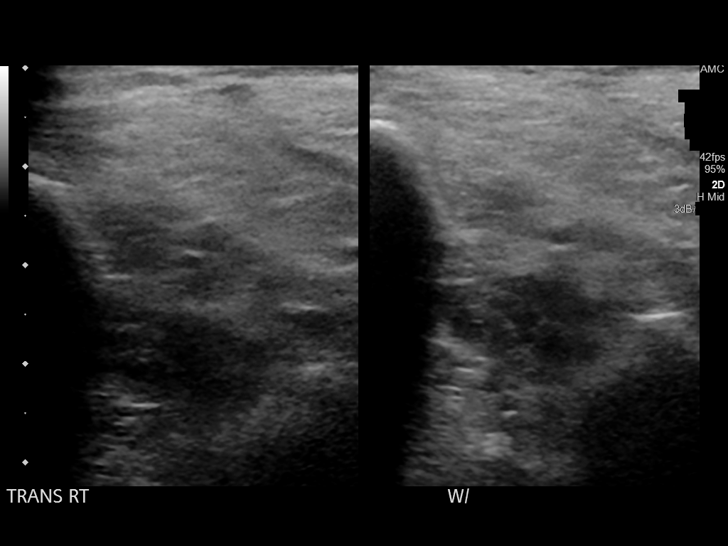
[im 28/54]
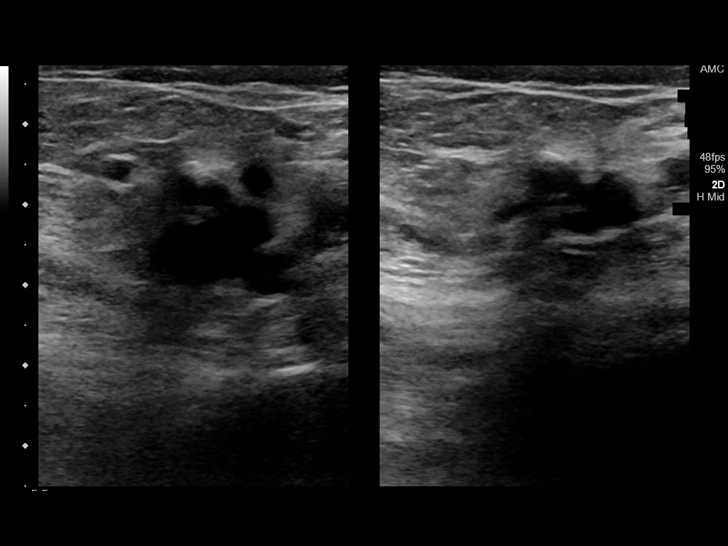
[im 30/54]
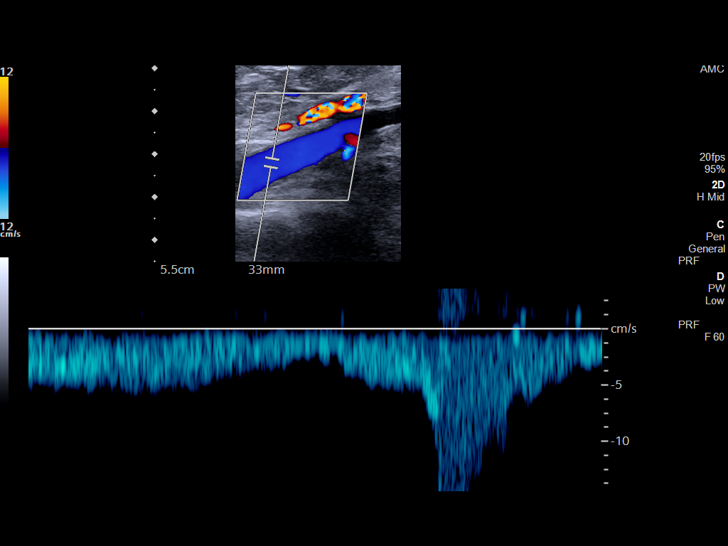
[im 35/54]
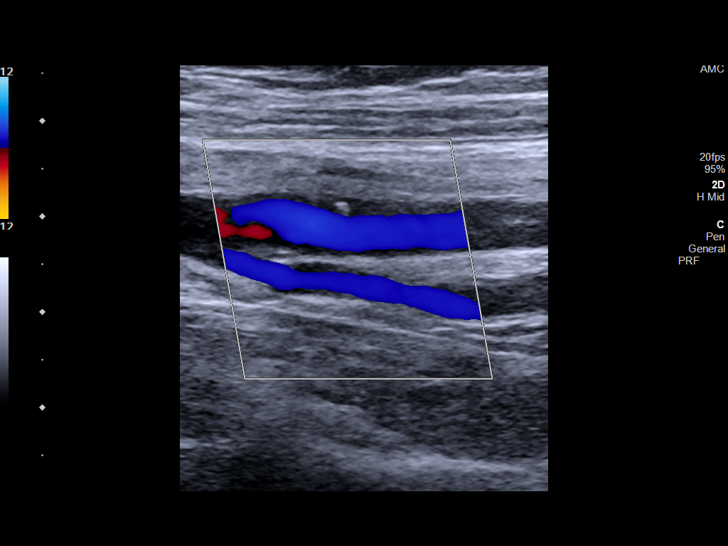
[im 40/54]
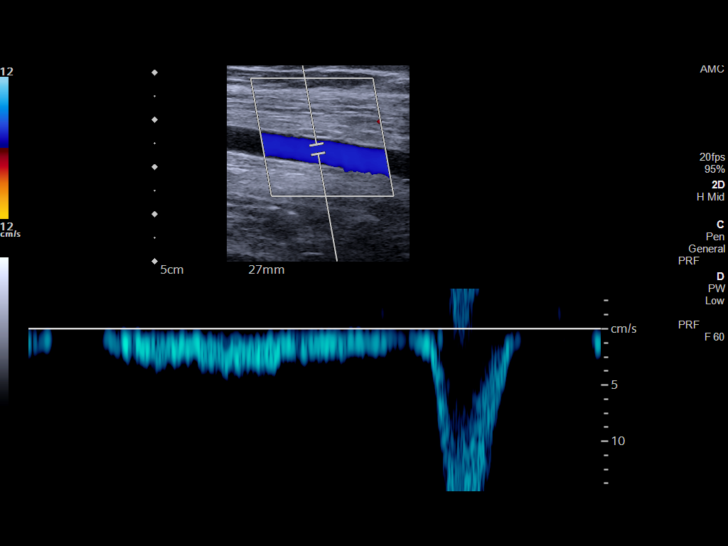
[im 44/54]
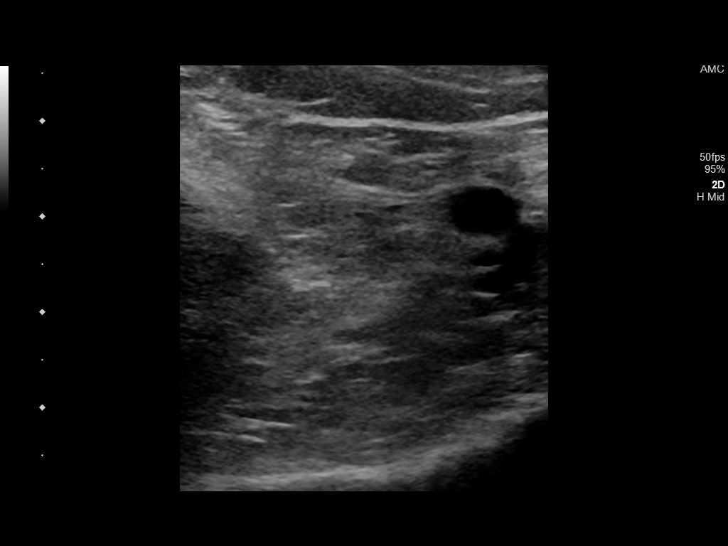
[im 49/54]
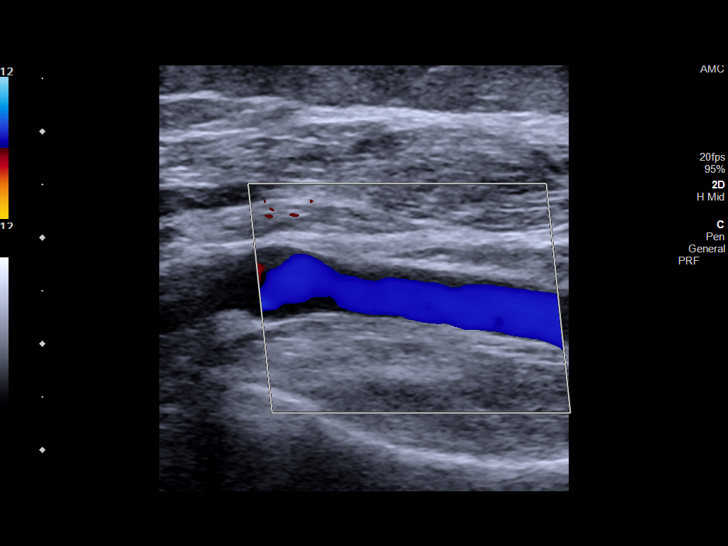
[im 54/54]
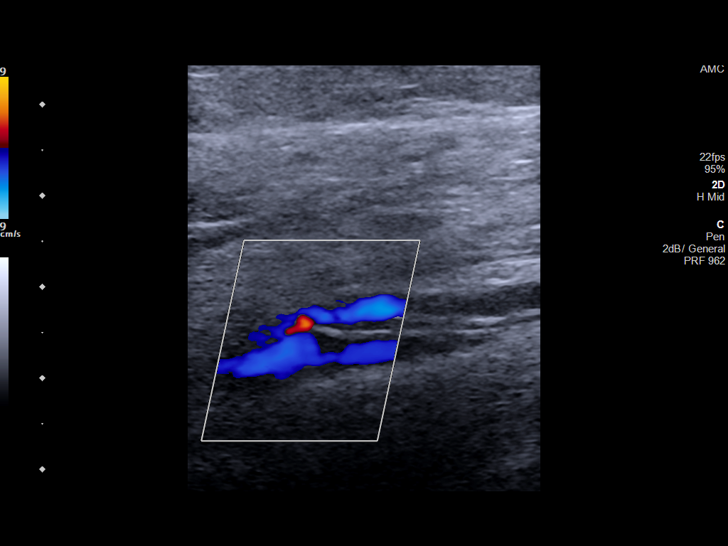

[13 of 24 positions shown; findings below may reference images not displayed]

FINDINGS: RIGHT LOWER EXTREMITY

Common Femoral Vein: No evidence of thrombus. Normal
compressibility, respiratory phasicity and response to augmentation.

Saphenofemoral Junction: No evidence of thrombus. Normal
compressibility and flow on color Doppler imaging.

Profunda Femoral Vein: No evidence of thrombus. Normal
compressibility and flow on color Doppler imaging.

Femoral Vein: No evidence of thrombus. Normal compressibility,
respiratory phasicity and response to augmentation.

Popliteal Vein: No evidence of thrombus. Normal compressibility,
respiratory phasicity and response to augmentation.

Calf Veins: No evidence of thrombus. Normal compressibility and flow
on color Doppler imaging.

Venous Reflux:  None.

Other Findings:  None.

LEFT LOWER EXTREMITY

Common Femoral Vein: No evidence of thrombus. Normal
compressibility, respiratory phasicity and response to augmentation.

Saphenofemoral Junction: No evidence of thrombus. Normal
compressibility and flow on color Doppler imaging.

Profunda Femoral Vein: No evidence of thrombus. Normal
compressibility and flow on color Doppler imaging.

Femoral Vein: No evidence of thrombus. Normal compressibility,
respiratory phasicity and response to augmentation.

Popliteal Vein: No evidence of thrombus. Normal compressibility,
respiratory phasicity and response to augmentation.

Calf Veins: No evidence of thrombus. Normal compressibility and flow
on color Doppler imaging.

Venous Reflux:  None.

Other Findings:  None.
IMPRESSION: No evidence of deep venous thrombosis in either lower extremity.

## 2021-04-14 ENCOUNTER — Emergency Department
Admission: EM | Admit: 2021-04-14 | Discharge: 2021-04-14 | Disposition: A | Discharge status: Home / Self Care | Payer: Medicare Other | Attending: Emergency Medicine | Admitting: Emergency Medicine

## 2021-04-14 ENCOUNTER — Other Ambulatory Visit: Payer: Self-pay

## 2021-04-14 DIAGNOSIS — E119 Type 2 diabetes mellitus without complications: Secondary | ICD-10-CM | POA: Insufficient documentation

## 2021-04-14 DIAGNOSIS — Z79899 Other long term (current) drug therapy: Secondary | ICD-10-CM | POA: Diagnosis not present

## 2021-04-14 DIAGNOSIS — K529 Noninfective gastroenteritis and colitis, unspecified: Secondary | ICD-10-CM | POA: Insufficient documentation

## 2021-04-14 DIAGNOSIS — R55 Syncope and collapse: Secondary | ICD-10-CM | POA: Diagnosis present

## 2021-04-14 DIAGNOSIS — Z91040 Latex allergy status: Secondary | ICD-10-CM | POA: Insufficient documentation

## 2021-04-14 DIAGNOSIS — E860 Dehydration: Secondary | ICD-10-CM | POA: Insufficient documentation

## 2021-04-14 DIAGNOSIS — Z7901 Long term (current) use of anticoagulants: Secondary | ICD-10-CM | POA: Insufficient documentation

## 2021-04-14 DIAGNOSIS — I10 Essential (primary) hypertension: Secondary | ICD-10-CM | POA: Diagnosis not present

## 2021-04-14 DIAGNOSIS — Z87891 Personal history of nicotine dependence: Secondary | ICD-10-CM | POA: Insufficient documentation

## 2021-04-14 DIAGNOSIS — Z20822 Contact with and (suspected) exposure to covid-19: Secondary | ICD-10-CM | POA: Diagnosis not present

## 2021-04-14 DIAGNOSIS — Z85828 Personal history of other malignant neoplasm of skin: Secondary | ICD-10-CM | POA: Insufficient documentation

## 2021-04-14 LAB — BASIC METABOLIC PANEL
Anion gap: 6 (ref 5–15)
BUN: 10 mg/dL (ref 8–23)
CO2: 25 mmol/L (ref 22–32)
Calcium: 8.1 mg/dL — ABNORMAL LOW (ref 8.9–10.3)
Chloride: 104 mmol/L (ref 98–111)
Creatinine, Ser: 0.68 mg/dL (ref 0.44–1.00)
GFR, Estimated: >60 mL/min (ref >60)
Glucose, Bld: 163 mg/dL — ABNORMAL HIGH (ref 70–99)
Potassium: 4.2 mmol/L (ref 3.5–5.1)
Sodium: 135 mmol/L (ref 135–145)

## 2021-04-14 LAB — URINALYSIS, ROUTINE W REFLEX MICROSCOPIC
Bacteria, UA: NONE SEEN
Bilirubin Urine: NEGATIVE
Glucose, UA: NEGATIVE mg/dL
Ketones, ur: NEGATIVE mg/dL
Leukocytes,Ua: NEGATIVE
Nitrite: NEGATIVE
Protein, ur: 30 mg/dL — AB
Specific Gravity, Urine: 1.004 — ABNORMAL LOW (ref 1.005–1.030)
Squamous Epithelial / HPF: NONE SEEN (ref 0–5)
WBC, UA: NONE SEEN WBC/hpf (ref 0–5)
pH: 7 (ref 5.0–8.0)

## 2021-04-14 LAB — CBC
HCT: 43.7 % (ref 36.0–46.0)
Hemoglobin: 14.4 g/dL (ref 12.0–15.0)
MCH: 28.5 pg (ref 26.0–34.0)
MCHC: 33 g/dL (ref 30.0–36.0)
MCV: 86.5 fL (ref 80.0–100.0)
Platelets: 161 10*3/uL (ref 150–400)
RBC: 5.05 MIL/uL (ref 3.87–5.11)
RDW: 13.9 % (ref 11.5–15.5)
WBC: 7.9 10*3/uL (ref 4.0–10.5)
nRBC: 0 % (ref 0.0–0.2)

## 2021-04-14 LAB — RESP PANEL BY RT-PCR (FLU A&B, COVID) ARPGX2
Influenza A by PCR: NEGATIVE
Influenza B by PCR: NEGATIVE
SARS Coronavirus 2 by RT PCR: NEGATIVE

## 2021-04-14 MED ORDER — LACTATED RINGERS IV BOLUS
1000.0000 mL | Freq: Once | INTRAVENOUS | Status: AC
  Administered 2021-04-14: 13:00:00 1000 mL via INTRAVENOUS

## 2021-04-14 NOTE — ED Triage Notes (Signed)
Pt comes via EMs from [employer]. 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 Pt was assisted back to car, EMS arrived pt has near syncopal episode with them.   VSS  140/60 80-HR O2-97% RA  Pt was given fluid bolus. Pt states she feeling better. Pt states dizziness when standing up. Pt states diarrhea for last few days.

## 2021-04-14 NOTE — ED Notes (Signed)
Patient discharged to home per MD order. Patient in stable condition, and deemed medically cleared by ED provider for discharge. Discharge instructions reviewed with patient/family using "Teach Back"; verbalized understanding of medication education and administration, and information about follow-up care. Denies further concerns. ° °

## 2021-04-14 NOTE — ED Provider Notes (Signed)
[facility] Emergency Department Provider Note ____________________________________________   Event Date/Time   First MD Initiated Contact with Patient 04/14/21 1241     (approximate)  I have reviewed the triage vital signs and the nursing notes.  HISTORY  Chief Complaint Loss of Consciousness   HPI Claire Washington is a 87 y.o. femalewho presents to the ED for evaluation of a near syncopal episode.  Chart review indicates HTN, HLD.  Seen as an outpatient a couple weeks ago by PCP with concerns for postural dizziness improved with Pedialyte.  Patient reports greater than 1 month of intermittent presyncopal dizziness.  She further reports greater than 1 year of watery diarrhea, often most mornings she has a couple episodes but less throughout the day.  Denies ever having had melena or hematochezia.  She reports typical diarrhea this morning and generalized weakness with upper respiratory congestion little bit worse over the past 3 days alongside this.  She reports going to a local [employer] to pick up online order of her groceries when she felt very weak and had a near syncopal episode, causing staff to call 911.  She reports not even requesting an ambulance and did not want to be here, but the medics convinced her to do so.  She got some IV fluids with EMS and reports feeling better now.  Reports concern that she has had chronic GI losses with urinary frequency and stool frequency.  Denies recent antibiotics.  Denies fevers or abdominal pain or emesis.  Denies dysuria or foul-smelling urine.  She has not seen GI and reports frustration that her PCP has not referred her to GI yet over the past 1 year.  Past Medical History:  Diagnosis Date   Arthritis    Basal cell carcinoma    basel cell   Chronic cystitis    Diabetes mellitus type 2, controlled, without complications ([place])    type 2 or unspecified type diabetes mellitus without mention of  complication, not stated as uncontrolled   Disease of thyroid gland    Embolic stroke ([place])    a. 07/2016 MRI Brain: small right superior cerebellum and left lateral occipital cortex infarcts;  b. 08/2016 Echo: EF 60-65%, Gr1 DD, mild MR;  c. 08/2016 30 Day Event Monitor: No significant tachy/bradyarrhythmias. No afib.   Follicular neoplasm of thyroid    Hemorrhage of rectum and anus    Hyperlipidemia    Hyperlipidemia, unspecified    Hypertension    Leg pain    Osteoarthritis    Pernicious anemia    Small vessel disease ([place])    Spinal stenosis, lumbar region without neurogenic claudication    Subdural hematoma    a. 2013 in setting of syncope.   Syncope and collapse    a. 2013 - syncope and fall w/ resultant subdural hematoma; b. prev nl echo and stress test @ [facility].   Thyroid nodule    Vertigo & Chronic Dizziness    a. Previously seen by ENT with Physical Therapy.  MRI 2000 small vessel disease    Patient Active Problem List   Diagnosis Date Noted   Hyponatremia 08/31/2020   Lymphedema 03/09/2019   Swelling of limb 02/28/2019   Cellulitis of left leg 12/31/2018   Hip pain, bilateral 12/31/2018   Cellulitis 12/27/2018   Carotid stenosis 12/28/2017   Vertigo 11/30/2017   Imbalance due to old stroke 11/03/2016   Gait instability 10/19/2016   Ataxia 08/30/2016   History of stroke 08/30/2016  Dizziness 11/03/2015   Abnormal PET scan of colon 06/18/2014   H/O seasonal allergies 06/04/2014   Lung nodule, solitary 16/01/9603   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   Hypertension 12/04/2011   Hyperlipidemia 12/04/2011   Syncope 12/04/2011   Diabetes mellitus ([place]) 12/04/2011   Smoking hx 12/04/2011    Past Surgical History:  Procedure Laterality Date   BILATERAL SALPINGOOPHORECTOMY     BREAST CYST EXCISION     ? side, yrs ago   COLONOSCOPY  01/26/2009   Diverticulosis   CYSTOSCOPY     HEMORRHOIDECTOMY BY SIMPLE LIGATION     PR COLONOSCOPY FLX  W/ENDOSCOPIC MUCOSAL RESECTION  09/02/2014   Procedure: COLONOSCOPY, FLEXIBLE; WITH ENDOSCOPIC MUCOSAL RESECTION; Surgeon: Saunders Revel, MD; [facility]; Service: Gastroenterology   PR [place] FLX W/RMVL OF TUMOR POLYP LESION SNARE TQ  09/02/2014   Procedure: COLONOSCOPY FLEX; W/REMOV TUMOR/LES BY SNARE; Surgeon: Saunders Revel, MD; [facility]: [facility]; Service: Gastroenterology   SKIN SURGERY     TOTAL ABDOMINAL HYSTERECTOMY W/ BILATERAL SALPINGOOPHORECTOMY  1970s    Prior to Admission medications   Medication Sig Start Date End Date Taking? Authorizing Provider  amLODipine (NORVASC) 5 MG tablet Take 1 tablet (5 mg total) by mouth 2 (two) times daily. 12/17/20   Minna Merritts, MD  atorvastatin (LIPITOR) 10 MG tablet Take 1 tablet (10 mg total) by mouth daily. 12/17/20   Minna Merritts, MD  busPIRone (BUSPAR) 5 MG tablet Take 5 mg by mouth 2 (two) times daily as needed. 12/10/20   [provider]  clopidogrel (PLAVIX) 75 MG tablet Take 1 tablet (75 mg total) by mouth daily. 12/17/20   Minna Merritts, MD  hydrALAZINE (APRESOLINE) 25 MG tablet TAKE 1 TABLET BY MOUTH 3 TIMES DAILY AS NEEDED FOR SYSTOLIC BLOOD PRESSURE >540 12/17/20   Minna Merritts, MD  metoprolol tartrate (LOPRESSOR) 25 MG tablet Take 0.5 tablets (12.5 mg total) by mouth 2 (two) times daily. Take an additional half tablet for top number of blood pressure 160 or greater and/or pulse 90 or greater. 12/17/20 12/12/21  Minna Merritts, MD  Multiple Vitamins-Minerals (CENTRUM SILVER PO) Take 1 tablet by mouth daily. Reported on 06/11/2015    [provider]  vitamin B-12 (CYANOCOBALAMIN) 1000 MCG tablet Take by mouth daily.     [provider]  Vitamin D, Ergocalciferol, (DRISDOL) 1.25 MG (50000 UT) CAPS capsule Take 50,000 Units by mouth every 7 (seven) days.    [provider]    Allergies Latex, Hydrocortisone, Sulfa antibiotics, and Tape  Family  History  Problem Relation Age of Onset   Colon cancer Mother    Heart disease Father    Heart attack Father    Prostate cancer Father    Stroke Father    Heart attack Brother    Asthma Daughter    Diabetes Other        Siblings   Breast cancer Neg Hx     Social History Social History   Tobacco Use   Smoking status: Former    Packs/day: 0.25    Years: 20.00    Pack years: 5.00    Types: Cigarettes    Quit date: 09/15/1993    Years since quitting: 27.5   Smokeless tobacco: Never  Vaping Use   Vaping Use: Never used  Substance Use Topics   Alcohol use: Yes    Alcohol/week: 2.0 standard drinks    Types: 1 Glasses of wine,  1 Shots of liquor per week    Comment: occassional   Drug use: No    Review of Systems  Constitutional: No fever/chills.  Positive generalized weakness. Eyes: No visual changes. ENT: No sore throat.  Positive for upper respiratory congestion Cardiovascular: Denies chest pain. Respiratory: Denies shortness of breath. Gastrointestinal: No abdominal pain.  No nausea, .  No constipation. Positive for chronic watery diarrhea. Genitourinary: Negative for dysuria. Musculoskeletal: Negative for back pain. Skin: Negative for rash. Neurological: Negative for headaches, focal weakness or numbness.  Positive for dizziness  ____________________________________________   PHYSICAL EXAM:  VITAL SIGNS: Vitals:   04/14/21 1138  BP: (!) 131/51  Pulse: 77  Resp: 18  Temp: 98.2 F (36.8 C)  SpO2: 95%     Constitutional: Alert and oriented. Well appearing and in no acute distress. Eyes: Conjunctivae are normal. PERRL. EOMI. Head: Atraumatic. Nose: No congestion/rhinnorhea. Mouth/Throat: Mucous membranes are moist.  Oropharynx non-erythematous. Neck: No stridor. No cervical spine tenderness to palpation. Cardiovascular: Normal rate, regular rhythm. Good peripheral circulation. Respiratory: Normal respiratory effort.  No retractions. Lungs  CTAB. Gastrointestinal: Soft , nondistended, nontender to palpation.  Musculoskeletal: No joint effusions. No signs of acute trauma. Neurologic:  Normal speech and language. No gross focal neurologic deficits are appreciated.  Cranial nerves II through XII intact 5/5 strength and sensation in all 4 extremities Skin:  Skin is warm, dry and intact. No rash noted. Psychiatric: Mood and affect are normal. Speech and behavior are normal. ____________________________________________   LABS (all labs ordered are listed, but only abnormal results are displayed)  Labs Reviewed  BASIC METABOLIC PANEL - Abnormal; Notable for the following components:      Result Value   Glucose, Bld 163 (*)    Calcium 8.1 (*)    All other components within normal limits  URINALYSIS, ROUTINE W REFLEX MICROSCOPIC - Abnormal; Notable for the following components:   Color, Urine STRAW (*)    APPearance CLEAR (*)    Specific Gravity, Urine 1.004 (*)    Hgb urine dipstick SMALL (*)    Protein, ur 30 (*)    All other components within normal limits  RESP PANEL BY RT-PCR (FLU A&B, COVID) ARPGX2  CBC  CBG MONITORING, ED   ____________________________________________  12 Lead EKG  Sinus rhythm, rate of 81 bpm.  Normal axis and intervals.  2 PVC.  No evidence of acute ischemia. ____________________________________________  RADIOLOGY  ED MD interpretation:    Official radiology report(s): No results found.  ____________________________________________   PROCEDURES and INTERVENTIONS  Procedure(s) performed (including Critical Care):  Procedures  Medications  lactated ringers bolus 1,000 mL (0 mLs Intravenous Stopped 04/14/21 1412)    ____________________________________________   MDM / ED COURSE   87 year old female presents to the ED after an episode of near syncope and weakness in the setting of chronic diarrhea, likely a degree of mild dehydration and suitable for outpatient management.  She  looks clinically well.  Normal vitals and a benign abdomen on examination.  No evidence of neurologic or vascular deficits.  No signs of trauma.  Blood work is benign without electrolyte derangements, kidney dysfunction or stigmata of sepsis.  No recent antibiotics and no suspicion for C. difficile.  Urine without infectious features and she feels better after IV fluids.  We discussed management of chronic diarrhea at home, following up with GI and return precautions for the ED.  Clinical Course as of 04/14/21 1412  Thu Apr 14, 2021  1410 Reassessed.  Feeling  better.  We discussed benign work-up and possible etiologies of her dizziness.  We discussed chronic diarrhea and following up with GI.  We discussed return precautions for the ED.  She is thankful [DS]    Clinical Course User Index [DS] Vladimir Crofts, MD    ____________________________________________   FINAL CLINICAL IMPRESSION(S) / ED DIAGNOSES  Final diagnoses:  Near syncope  Dehydration  Chronic diarrhea     ED Discharge Orders     None        Madailein Londo Tamala Julian   Note:  This document was prepared using Dragon voice recognition software and may include unintentional dictation errors.    Vladimir Crofts, MD 04/14/21 [phone]

## 2021-04-14 NOTE — Discharge Instructions (Signed)
Please reach out to the local GI doctor to discuss your chronic diarrhea  If you have any spells of passing out all the way, fevers with your diarrhea, or blood with her diarrhea, then please return to the ED.

## 2021-06-01 ENCOUNTER — Encounter: Payer: Self-pay | Admitting: Cardiovascular Disease

## 2021-06-01 ENCOUNTER — Ambulatory Visit (INDEPENDENT_AMBULATORY_CARE_PROVIDER_SITE_OTHER): Payer: Medicare Other | Admitting: Cardiovascular Disease

## 2021-06-01 ENCOUNTER — Other Ambulatory Visit: Payer: Self-pay

## 2021-06-01 VITALS — BP 149/75 | HR 67 | Ht 61.0 in | Wt 118.4 lb

## 2021-06-01 DIAGNOSIS — R0989 Other specified symptoms and signs involving the circulatory and respiratory systems: Secondary | ICD-10-CM | POA: Diagnosis not present

## 2021-06-01 DIAGNOSIS — I10 Essential (primary) hypertension: Secondary | ICD-10-CM | POA: Diagnosis not present

## 2021-06-01 DIAGNOSIS — E1159 Type 2 diabetes mellitus with other circulatory complications: Secondary | ICD-10-CM

## 2021-06-01 DIAGNOSIS — I340 Nonrheumatic mitral (valve) insufficiency: Secondary | ICD-10-CM

## 2021-06-01 DIAGNOSIS — I6523 Occlusion and stenosis of bilateral carotid arteries: Secondary | ICD-10-CM | POA: Diagnosis not present

## 2021-06-01 DIAGNOSIS — R0600 Dyspnea, unspecified: Secondary | ICD-10-CM | POA: Diagnosis not present

## 2021-06-01 DIAGNOSIS — Z8673 Personal history of transient ischemic attack (TIA), and cerebral infarction without residual deficits: Secondary | ICD-10-CM

## 2021-06-01 DIAGNOSIS — E785 Hyperlipidemia, unspecified: Secondary | ICD-10-CM

## 2021-06-01 NOTE — Patient Instructions (Addendum)
Apple watch to look at heart rhythm in hope of catching a near syncope or syncope spell  Medication Instructions:  No changes  If you need a refill on your cardiac medications before your next appointment, please call your pharmacy.   Lab work: No new labs needed  Testing/Procedures: No new testing needed  Follow-Up: At [facility], you and your health needs are our priority.  As part of our continuing mission to provide you with exceptional heart care, we have created designated Provider Care Teams.  These Care Teams include your primary Cardiologist (physician) and Advanced Practice Providers (APPs -  Physician Assistants and Nurse Practitioners) who all work together to provide you with the care you need, when you need it.  You will need a follow up appointment in 6 months with APP  Providers on your designated Care Team:   Murray Hodgkins, NP [name], PA-C Cadence Kathlen Mody, [place]  COVID-19 Vaccine Information can be found at: [email or web address] For questions related to vaccine distribution or appointments, please email [email or web address] or call [phone].

## 2021-06-01 NOTE — Progress Notes (Signed)
Cardiology Office Note  Date:  06/01/2021   ID:  Claire Washington, DOB 24-Mar-1925, MRN [id number]  PCP:  Tracie Harrier, MD   Chief Complaint  Patient presents with   Syncope & Weakness     Patient c/o syncopal spells x 3 and feels very weak. Medications reviewed by the patient verbally.     HPI:  Claire Washington  is a very pleasant 88 year-old woman with history of   hypertension, chronic dizziness, benign positional vertigo,  osteoarthritis,  hyperlipidemia,  carotid arterial disease (<39%) chronic cystitis,  diabetes,  Long smoking history until age 56 Previous TIAs per the patient, CVA 2018 Carotid stenosis <39% b/l in 2019 Chronic  dizziness in her head, gait instability, for years, progressive who presents for routine followup of her dizziness, hypertension, TIA/CVAs  Last seen in clinic by myself September 2022  ER September 27, 2020 Weakness, given fluids felt better  ER September 29, 2020 for weakness Given fluids, felt better  Seen in the emergency room March 07, 2021 dehydration Blood pressure 546 systolic up to 568L  Seen in the emergency room April 14, 2021 near syncope 1 year of watery diarrhea 1 month reported near syncope Near syncope when going to pick up groceries at [employer], EMS called, given IV fluids felt better Work-up negative in the ER  For most of her visits above lab work reviewed, normal renal function Only prerenal lab appeared to be July 02, 2020  In the er 09/2020 generalized weakness and dizziness with standing that is been going on for about 4 months, worse in the last few weeks.    Diarrhea better to some degree Last 1.5 wks ago  Tired sleepy, fatigue Stopped losartan , feels dizziness better  Orthostatics negative: today Pressures 120s to 150s heart rate stays in the 70s reports dizziness throughout any position changes  EKG personally reviewed by myself on todays visit Shows normal sinus rhythm rate 67 bpm no significant  ST-T wave changes  Past medical history reviewed Holter monitor reviewed from primary care, this shows predominant normal sinus rhythm, rare episodes of narrow complex tachycardia longest 6 beats, 3 beat run wide-complex  Echo 11/2020 reviewed in detail  1. Left ventricular ejection fraction, by estimation, is 70 to 75%. The  left ventricle has hyperdynamic function. The left ventricle has no  regional wall motion abnormalities. Left ventricular diastolic parameters  are consistent with Grade II diastolic  dysfunction (pseudonormalization).   2. Right ventricular systolic function is normal. The right ventricular  size is normal.   3. The mitral valve is degenerative. Trivial mitral valve regurgitation.   4. The aortic valve was not well visualized. Aortic valve regurgitation  is not visualized.   5. The inferior vena cava is normal in size with greater than 50%  respiratory variability, suggesting right atrial pressure of 3 mmHg.   Previously seen by neurology for chronic dizziness MRI brain without contrast done on 11/30/2017 which showed moderate atrophy and moderate microvascular ischemic changes. --old cerebellar stroke, microvascular ischemic changes, and inner ear issues: worsened.  Echo 08/2016 Normal LV function No significant valve disease No indication from the echo as to cause of dizziness  Event monitor  08/2016 Normal sinus rhythm Rare PVCs, <1% No other significant arrhythmia noted Symptoms of dizziness did not seem to correlate to underlying arrhythmia  Chronic equilibrium and dizziness dating back several years, worse recently Difficult time with equilibrium.  Working with PT   PMH:   has a past  medical history of Arthritis, Basal cell carcinoma, Chronic cystitis, Diabetes mellitus type 2, controlled, without complications ([place]), Disease of thyroid gland, Embolic stroke ([place]), Follicular neoplasm of thyroid, Hemorrhage of rectum and anus, Hyperlipidemia,  Hyperlipidemia, unspecified, Hypertension, Leg pain, Osteoarthritis, Pernicious anemia, Small vessel disease ([place]), Spinal stenosis, lumbar region without neurogenic claudication, Subdural hematoma, Syncope and collapse, Thyroid nodule, and Vertigo & Chronic Dizziness.  PSH:    Past Surgical History:  Procedure Laterality Date   BILATERAL SALPINGOOPHORECTOMY     BREAST CYST EXCISION     ? side, yrs ago   COLONOSCOPY  01/26/2009   Diverticulosis   CYSTOSCOPY     HEMORRHOIDECTOMY BY SIMPLE LIGATION     PR COLONOSCOPY FLX W/ENDOSCOPIC MUCOSAL RESECTION  09/02/2014   Procedure: COLONOSCOPY, FLEXIBLE; WITH ENDOSCOPIC MUCOSAL RESECTION; Surgeon: Saunders Revel, MD; [facility]; Service: Gastroenterology   PR [place] FLX W/RMVL OF TUMOR POLYP LESION SNARE TQ  09/02/2014   Procedure: COLONOSCOPY FLEX; W/REMOV TUMOR/LES BY SNARE; Surgeon: Saunders Revel, MD; Location: [facility]; Service: Gastroenterology   SKIN SURGERY     TOTAL ABDOMINAL HYSTERECTOMY W/ BILATERAL SALPINGOOPHORECTOMY  1970s    Current Outpatient Medications  Medication Sig Dispense Refill   amLODipine (NORVASC) 5 MG tablet Take 1 tablet (5 mg total) by mouth 2 (two) times daily. 180 tablet 3   atorvastatin (LIPITOR) 10 MG tablet Take 1 tablet (10 mg total) by mouth daily. 90 tablet 3   clopidogrel (PLAVIX) 75 MG tablet Take 1 tablet (75 mg total) by mouth daily. 90 tablet 3   hydrALAZINE (APRESOLINE) 25 MG tablet TAKE 1 TABLET BY MOUTH 3 TIMES DAILY AS NEEDED FOR SYSTOLIC BLOOD PRESSURE >235 90 tablet 3   metoprolol tartrate (LOPRESSOR) 25 MG tablet Take 0.5 tablets (12.5 mg total) by mouth 2 (two) times daily. Take an additional half tablet for top number of blood pressure 160 or greater and/or pulse 90 or greater. 90 tablet 3   Multiple Vitamins-Minerals (CENTRUM SILVER PO) Take 1 tablet by mouth daily. Reported on 06/11/2015     vitamin B-12 (CYANOCOBALAMIN) 1000 MCG tablet Take by  mouth daily.      Vitamin D, Ergocalciferol, (DRISDOL) 1.25 MG (50000 UT) CAPS capsule Take 50,000 Units by mouth every 7 (seven) days.     busPIRone (BUSPAR) 5 MG tablet Take 5 mg by mouth 2 (two) times daily as needed. (Patient not taking: Reported on 06/01/2021)     No current facility-administered medications for this visit.    Allergies:   Latex, Hydrocortisone, Sulfa antibiotics, and Tape   Social History:  The patient  reports that she quit smoking about 27 years ago. Her smoking use included cigarettes. She has a 5.00 pack-year smoking history. She has never used smokeless tobacco. She reports current alcohol use of about 2.0 standard drinks per week. She reports that she does not use drugs.   Family History:   family history includes Asthma in her daughter; Colon cancer in her mother; Diabetes in an other family member; Heart attack in her brother and father; Heart disease in her father; Prostate cancer in her father; Stroke in her father.   Review of Systems  Constitutional: Negative.   HENT: Negative.    Respiratory: Negative.    Cardiovascular: Negative.   Gastrointestinal: Negative.   Musculoskeletal: Negative.   Neurological:  Positive for dizziness.  Psychiatric/Behavioral: Negative.    All other systems reviewed and are negative.  PHYSICAL EXAM: VS:  BP (!) 149/75 (  BP Location: Left Arm, Patient Position: Sitting, Cuff Size: Normal)    Pulse 67    Ht 5\' 1"  (1.549 m)    Wt 118 lb 6 oz (53.7 kg)    SpO2 94%    BMI 22.37 kg/m  , BMI Body mass index is 22.37 kg/m. Constitutional:  oriented to person, place, and time. No distress.  HENT:  Head: Grossly normal Eyes:  no discharge. No scleral icterus.  Neck: No JVD, no carotid bruits  Cardiovascular: Regular rate and rhythm, no murmurs appreciated Pulmonary/Chest: Clear to auscultation bilaterally, no wheezes or rails Abdominal: Soft.  no distension.  no tenderness.  Musculoskeletal: Normal range of motion Neurological:   normal muscle tone. Coordination normal. No atrophy Skin: Skin warm and dry Psychiatric: normal affect, pleasant   Recent Labs: 09/29/2020: ALT 14; Magnesium 2.1; TSH 2.545 04/14/2021: BUN 10; Creatinine, Ser 0.68; Hemoglobin 14.4; Platelets 161; Potassium 4.2; Sodium 135    Lipid Panel No results found for: CHOL, HDL, LDLCALC, TRIG    Wt Readings from Last 3 Encounters:  06/01/21 118 lb 6 oz (53.7 kg)  12/28/20 123 lb (55.8 kg)  12/17/20 121 lb 6 oz (55.1 kg)     ASSESSMENT AND PLAN:  Essential hypertension Blood pressure stable range 120s to 150s No medication changes made especially in light of near syncope spells, chronic dizziness  Dizziness Chronic issue MRI showing  Strokes, microvascular disease  followed by neurology Unclear if recent weakness episode secondary to neurologic etiology  Stroke Multiple embolic strokes on MRI carotid disease on the right followed by Dr. Lucky Cowboy Most recent ultrasound reviewed, nonobstructive On Plavix, statin/ Lipitor  Near syncope/syncope Numerous episodes through 2022 Seen in the emergency room, diagnosed with " dehydration" Often feels better with time, IV fluids She does not feel that he should have episode associated with GI/diarrhea episode.  Less likely vasovagal though select episodes may be associated with GI symptoms Some seem to be somewhat better with Pedialyte.  Has been taking this on a regular basis Unable to exclude cardiac arrhythmia ZIO monitor offered, better option may be a Apple Watch given scarcity of episodes Blood pressure does not appear to be an issue  Smoking hx Currently a nonsmoker  Type 2 diabetes mellitus with other circulatory complication, without long-term current use of insulin (HCC) Weight stable Managed by primary care  Mixed hyperlipidemia Goal LDL less than 70, continue Lipitor   Total encounter time more than 40 minutes  Greater than 50% was spent in counseling and coordination of  care with the patient   Orders Placed This Encounter  Procedures   EKG 12-Lead     Signed, Esmond Plants, M.D., Ph.D. 06/01/2021  [place], [place]

## 2021-06-06 ENCOUNTER — Telehealth: Payer: Self-pay | Admitting: Cardiovascular Disease

## 2021-06-06 ENCOUNTER — Ambulatory Visit (INDEPENDENT_AMBULATORY_CARE_PROVIDER_SITE_OTHER): Payer: Medicare Other

## 2021-06-06 DIAGNOSIS — R55 Syncope and collapse: Secondary | ICD-10-CM

## 2021-06-06 DIAGNOSIS — R42 Dizziness and giddiness: Secondary | ICD-10-CM

## 2021-06-06 NOTE — Telephone Encounter (Signed)
Return call to Mrs. Cortinas, discuss ZIO monitor, per Dr. Donivan Scull notes OV 2/15  Near syncope/syncope Numerous episodes through 2022 Seen in the emergency room, diagnosed with " dehydration" Often feels better with time, IV fluids She does not feel that he should have episode associated with GI/diarrhea episode.  Less likely vasovagal though select episodes may be associated with GI symptoms Some seem to be somewhat better with Pedialyte.  Has been taking this on a regular basis Unable to exclude cardiac arrhythmia ZIO monitor offered, better option may be a Apple Watch given scarcity of episodes  Mrs. Henrene Pastor would like to proceed with a monitor, understands will be mailed to her, wear for 14 days (or how long she can take the adhesive), reviewed instructions with her on how to place monitor, the booklet, and how to return monitor. Pt verbalized udnerstanding.   Otherwise all questions were address and no additional concerns at this time. Mrs. Mandley thankful for the return call and advice. Agreeable to plan, will call back for anything further.    Zio order

## 2021-06-06 NOTE — Telephone Encounter (Signed)
Patient states she has decided to go forward with the heart monitor. Please call to discuss

## 2021-06-12 DIAGNOSIS — R55 Syncope and collapse: Secondary | ICD-10-CM

## 2021-06-12 DIAGNOSIS — R42 Dizziness and giddiness: Secondary | ICD-10-CM | POA: Diagnosis not present

## 2021-07-26 ENCOUNTER — Telehealth: Payer: Self-pay | Admitting: Emergency Medicine

## 2021-07-26 NOTE — Telephone Encounter (Signed)
Called and spoke with patient. Results reviewed with patient, pt verbalized understanding,  questions (if any) answered.   ?

## 2021-07-26 NOTE — Telephone Encounter (Signed)
-----   Message from Minna Merritts, MD sent at 07/22/2021  6:19 PM EDT ----- ?Event monitor ?Normal sinus rhythm ?Diary events triggered with PVCs ?PVCs are rare, around 1% of beats ?

## 2021-11-29 ENCOUNTER — Ambulatory Visit (INDEPENDENT_AMBULATORY_CARE_PROVIDER_SITE_OTHER): Payer: Medicare Other | Admitting: Cardiovascular Disease

## 2021-11-29 ENCOUNTER — Encounter: Payer: Self-pay | Admitting: Cardiovascular Disease

## 2021-11-29 VITALS — BP 120/52 | HR 67 | Ht 60.0 in | Wt 122.2 lb

## 2021-11-29 DIAGNOSIS — I340 Nonrheumatic mitral (valve) insufficiency: Secondary | ICD-10-CM

## 2021-11-29 DIAGNOSIS — R55 Syncope and collapse: Secondary | ICD-10-CM | POA: Diagnosis not present

## 2021-11-29 DIAGNOSIS — R0989 Other specified symptoms and signs involving the circulatory and respiratory systems: Secondary | ICD-10-CM

## 2021-11-29 DIAGNOSIS — I6523 Occlusion and stenosis of bilateral carotid arteries: Secondary | ICD-10-CM

## 2021-11-29 DIAGNOSIS — E1159 Type 2 diabetes mellitus with other circulatory complications: Secondary | ICD-10-CM | POA: Diagnosis not present

## 2021-11-29 DIAGNOSIS — E785 Hyperlipidemia, unspecified: Secondary | ICD-10-CM

## 2021-11-29 DIAGNOSIS — I10 Essential (primary) hypertension: Secondary | ICD-10-CM

## 2021-11-29 DIAGNOSIS — R0600 Dyspnea, unspecified: Secondary | ICD-10-CM

## 2021-11-29 DIAGNOSIS — Z8673 Personal history of transient ischemic attack (TIA), and cerebral infarction without residual deficits: Secondary | ICD-10-CM

## 2021-11-29 MED ORDER — HYDRALAZINE HCL 25 MG PO TABS: ORAL_TABLET | ORAL | 3 refills | Status: AC

## 2021-11-29 MED ORDER — AMLODIPINE BESYLATE 5 MG PO TABS: 7.5000 mg | ORAL_TABLET | Freq: Every evening | ORAL | 3 refills | Status: DC

## 2021-11-29 MED ORDER — METOPROLOL TARTRATE 25 MG PO TABS: 25.0000 mg | ORAL_TABLET | Freq: Every evening | ORAL | 3 refills | Status: DC

## 2021-11-29 MED ORDER — METOPROLOL SUCCINATE ER 25 MG PO TB24: 25.0000 mg | ORAL_TABLET | Freq: Every evening | ORAL | 3 refills | Status: DC

## 2021-11-29 NOTE — Patient Instructions (Addendum)
Medication Instructions:  Please stop the metoprolol tartrate Change to the metoprolol succinate 25 mg in the evening  Take amlodipine 5 mg and 2.5 in the evening  Hydralazine 25 mg as needed for pressure >160  Try omeprazole 20 mg daily for nausea, OTC  If you need a refill on your cardiac medications before your next appointment, please call your pharmacy.   Lab work: No new labs needed  Testing/Procedures: No new testing needed  Follow-Up: At [facility] you and your health needs are our priority.  As part of our continuing mission to provide you with exceptional heart care, we have created designated Provider Care Teams.  These Care Teams include your primary Cardiologist (physician) and Advanced Practice Providers (APPs -  Physician Assistants and Nurse Practitioners) who all work together to provide you with the care you need, when you need it.  You will need a follow up appointment in 6 months, APP OK  Providers on your designated Care Team:   Murray Hodgkins, NP [name], PA-C Cadence Kathlen Mody, [place]  COVID-19 Vaccine Information can be found at: [email or web address] For questions related to vaccine distribution or appointments, please email [email or web address] or call [phone].

## 2021-11-29 NOTE — Progress Notes (Signed)
Cardiology Office Note  Date:  11/29/2021   ID:  Claire Washington, DOB 05-22-1924, MRN [id number]  PCP:  Tracie Harrier, MD   Chief Complaint  Patient presents with   Follow-up    6 Month Follow Up,  No new Cardiac concerns     HPI:  Claire Washington  is a 88ear-old woman with history of   hypertension, chronic dizziness, benign positional vertigo,  osteoarthritis,  hyperlipidemia,  carotid arterial disease (<39%) chronic cystitis,  diabetes,  Long smoking history until age 87 Previous TIAs per the patient, CVA 2018 Carotid stenosis <39% b/l in 2019 Chronic  dizziness in her head, gait instability, for years, progressive who presents for routine followup of her dizziness, hypertension, TIA/CVAs  Last seen in clinic by myself February 2023 Does not feel well, "Dehydrated, malaise, weak", legs weak Feel symptoms may be worse after she takes her morning medications Polyuria, feels that fluid she drinks does not stay in her leading to dehydration  Renal function normal in 2023 on multiple lab checks General weakness, not working with PT, only just started  Taking low-dose amlodipine in the morning 2.5 with 5 mg in the evening, metoprolol tartrate 12.5 twice daily Taking hydralazine as needed for high blood pressure Most of her blood pressure measurements at home running 448 up to 185 systolic  Sometimes legs give out for unclear reasons  EKG personally reviewed by myself on todays visit Normal sinus rhythm rate 67 bpm no significant ST-T wave changes  Her past medical history reviewed ER September 27, 2020 Weakness, given fluids felt better  ER September 29, 2020 for weakness Given fluids, felt better  Seen in the emergency room March 07, 2021 dehydration Blood pressure 631 systolic up to 497W  Seen in the emergency room April 14, 2021 near syncope 1 year of watery diarrhea 1 month reported near syncope Near syncope when going to pick up groceries at [employer],  EMS called, given IV fluids felt better Work-up negative in the ER  For most of her visits above lab work reviewed, normal renal function Only prerenal lab appeared to be July 02, 2020  In the er 09/2020 generalized weakness and dizziness with standing that is been going on for about 4 months, worse in the last few weeks.    Diarrhea better to some degree Last 1.5 wks ago  Tired sleepy, fatigue Stopped losartan , feels dizziness better  Orthostatics negative: today Pressures 120s to 150s heart rate stays in the 70s reports dizziness throughout any position changes  EKG personally reviewed by myself on todays visit Shows normal sinus rhythm rate 67 bpm no significant ST-T wave changes  Past medical history reviewed Holter monitor reviewed from primary care, this shows predominant normal sinus rhythm, rare episodes of narrow complex tachycardia longest 6 beats, 3 beat run wide-complex  Echo 11/2020 reviewed in detail  1. Left ventricular ejection fraction, by estimation, is 70 to 75%. The  left ventricle has hyperdynamic function. The left ventricle has no  regional wall motion abnormalities. Left ventricular diastolic parameters  are consistent with Grade II diastolic  dysfunction (pseudonormalization).   2. Right ventricular systolic function is normal. The right ventricular  size is normal.   3. The mitral valve is degenerative. Trivial mitral valve regurgitation.   4. The aortic valve was not well visualized. Aortic valve regurgitation  is not visualized.   5. The inferior vena cava is normal in size with greater than 50%  respiratory variability, suggesting right atrial  pressure of 3 mmHg.   Previously seen by neurology for chronic dizziness MRI brain without contrast done on 11/30/2017 which showed moderate atrophy and moderate microvascular ischemic changes. --old cerebellar stroke, microvascular ischemic changes, and inner ear issues: worsened.  Echo 08/2016 Normal LV  function No significant valve disease No indication from the echo as to cause of dizziness  Event monitor  08/2016 Normal sinus rhythm Rare PVCs, <1% No other significant arrhythmia noted Symptoms of dizziness did not seem to correlate to underlying arrhythmia  Chronic equilibrium and dizziness dating back several years, worse recently Difficult time with equilibrium.  Working with PT   PMH:   has a past medical history of Arthritis, Basal cell carcinoma, Chronic cystitis, Diabetes mellitus type 2, controlled, without complications ([place]), Disease of thyroid gland, Embolic stroke ([place]), Follicular neoplasm of thyroid, Hemorrhage of rectum and anus, Hyperlipidemia, Hyperlipidemia, unspecified, Hypertension, Leg pain, Osteoarthritis, Pernicious anemia, Small vessel disease ([place]), Spinal stenosis, lumbar region without neurogenic claudication, Subdural hematoma ([place]), Syncope and collapse, Thyroid nodule, and Vertigo & Chronic Dizziness.  PSH:    Past Surgical History:  Procedure Laterality Date   BILATERAL SALPINGOOPHORECTOMY     BREAST CYST EXCISION     ? side, yrs ago   COLONOSCOPY  01/26/2009   Diverticulosis   CYSTOSCOPY     HEMORRHOIDECTOMY BY SIMPLE LIGATION     PR COLONOSCOPY FLX W/ENDOSCOPIC MUCOSAL RESECTION  09/02/2014   Procedure: COLONOSCOPY, FLEXIBLE; WITH ENDOSCOPIC MUCOSAL RESECTION; Surgeon: Saunders Revel, MD; [facility]; Service: Gastroenterology   PR [place] FLX W/RMVL OF TUMOR POLYP LESION SNARE TQ  09/02/2014   Procedure: COLONOSCOPY FLEX; W/REMOV TUMOR/LES BY SNARE; Surgeon: Saunders Revel, MD; Location: [facility]; Service: Gastroenterology   SKIN SURGERY     TOTAL ABDOMINAL HYSTERECTOMY W/ BILATERAL SALPINGOOPHORECTOMY  1970s    Current Outpatient Medications  Medication Sig Dispense Refill   atorvastatin (LIPITOR) 10 MG tablet Take 1 tablet (10 mg total) by mouth daily. 90 tablet 3   clopidogrel (PLAVIX) 75 MG  tablet Take 1 tablet (75 mg total) by mouth daily. 90 tablet 3   metoprolol succinate (TOPROL-XL) 25 MG 24 hr tablet Take 1 tablet (25 mg total) by mouth every evening. Take with or immediately following a meal. 90 tablet 3   Multiple Vitamins-Minerals (CENTRUM SILVER PO) Take 1 tablet by mouth daily. Reported on 06/11/2015     vitamin B-12 (CYANOCOBALAMIN) 1000 MCG tablet Take 500 mcg by mouth daily.     Vitamin D, Ergocalciferol, (DRISDOL) 1.25 MG (50000 UT) CAPS capsule Take 50,000 Units by mouth every 7 (seven) days.     amLODipine (NORVASC) 5 MG tablet Take 1.5 tablets (7.5 mg total) by mouth every evening. Take 1 tablet (5 mg) in the morning and 1/2 tablet (2.5 mg) in the evening 135 tablet 3   busPIRone (BUSPAR) 5 MG tablet Take 5 mg by mouth 2 (two) times daily as needed. (Patient not taking: Reported on 06/01/2021)     hydrALAZINE (APRESOLINE) 25 MG tablet TAKE 1 TABLET BY MOUTH 3 TIMES DAILY AS NEEDED FOR SYSTOLIC BLOOD PRESSURE >628 90 tablet 3   mupirocin ointment (BACTROBAN) 2 % Apply topically 2 (two) times daily.     No current facility-administered medications for this visit.    Allergies:   Latex, Hydrocortisone, Sulfa antibiotics, and Tape   Social History:  The patient  reports that she quit smoking about 28 years ago. Her smoking use included cigarettes. She has a  5.00 pack-year smoking history. She has never used smokeless tobacco. She reports current alcohol use of about 2.0 standard drinks of alcohol per week. She reports that she does not use drugs.   Family History:   family history includes Asthma in her daughter; Colon cancer in her mother; Diabetes in an other family member; Heart attack in her brother and father; Heart disease in her father; Prostate cancer in her father; Stroke in her father.   Review of Systems  Constitutional:  Positive for malaise/fatigue.  HENT: Negative.    Respiratory: Negative.    Cardiovascular: Negative.   Gastrointestinal: Negative.    Musculoskeletal: Negative.        Leg weakness  Neurological:  Positive for dizziness.  Psychiatric/Behavioral: Negative.    All other systems reviewed and are negative.   PHYSICAL EXAM: VS:  BP (!) 120/52 (BP Location: Left Arm, Patient Position: Sitting, Cuff Size: Normal)   Pulse 67   Ht 5' (1.524 m)   Wt 122 lb 3.2 oz (55.4 kg)   SpO2 97%   BMI 23.87 kg/m  , BMI Body mass index is 23.87 kg/m. Constitutional:  oriented to person, place, and time. No distress.  HENT:  Head: Grossly normal Eyes:  no discharge. No scleral icterus.  Neck: No JVD, no carotid bruits  Cardiovascular: Regular rate and rhythm, no murmurs appreciated Pulmonary/Chest: Clear to auscultation bilaterally, no wheezes or rails Abdominal: Soft.  no distension.  no tenderness.  Musculoskeletal: Normal range of motion Neurological:  normal muscle tone. Coordination normal. No atrophy Skin: Skin warm and dry Psychiatric: normal affect, pleasant   Recent Labs: 04/14/2021: BUN 10; Creatinine, Ser 0.68; Hemoglobin 14.4; Platelets 161; Potassium 4.2; Sodium 135    Lipid Panel No results found for: "CHOL", "HDL", "LDLCALC", "TRIG"    Wt Readings from Last 3 Encounters:  11/29/21 122 lb 3.2 oz (55.4 kg)  06/01/21 118 lb 6 oz (53.7 kg)  12/28/20 123 lb (55.8 kg)     ASSESSMENT AND PLAN:  Essential hypertension Reports feeling poorly after taking her medications in the morning Typically when she wakes up feels fine then soon after taking medication feels poorly Recommend she change metoprolol to tartrate to metoprolol succinate 25 mg in the p.m. Consider taking amlodipine 7.5 mg in the evening, none in the morning Hydralazine as needed for systolic pressure over 676  Dizziness Chronic issue, does not appear to be a cardiac issue MRI showing  Strokes, microvascular disease  followed by neurology  Chronic weakness Recently started PT Recommended regular walking program, leg strengthening  exercises  Stroke Multiple embolic strokes on MRI carotid disease on the right followed by Dr. Lucky Cowboy Most recent ultrasound reviewed, nonobstructive On Plavix, statin/ Lipitor  Near syncope/syncope Numerous episodes through 2022 Seen in the emergency room, diagnosed with " dehydration" Drinking lots of Pedialyte, coconut water Zio monitor with no significant arrhythmia Blood sugar stable, no indication of hypotension  Smoking hx Currently a nonsmoker  Type 2 diabetes mellitus with other circulatory complication, without long-term current use of insulin (HCC) Weight stable Managed by primary care  Mixed hyperlipidemia Goal LDL less than 70, continue Lipitor   Total encounter time more than 30 minutes  Greater than 50% was spent in counseling and coordination of care with the patient   Orders Placed This Encounter  Procedures   EKG 12-Lead     Signed, Esmond Plants, M.D., Ph.D. 11/29/2021  [place], [place]

## 2021-12-15 ENCOUNTER — Telehealth: Payer: Self-pay | Admitting: Cardiovascular Disease

## 2021-12-15 NOTE — Telephone Encounter (Signed)
Spoke with the patient. Patient sts that she is not requesting that Dr. Rockey Situ write a prescription for the steroidal patch for pain. Pt sts that her Orthopaedic advised her that since she is on plavix that is prescribed by Dr. Rockey Situ they will need Dr. Donivan Scull ok before writing the prescription.  Adv the pt that I will fwd the msg to Dr. Rockey Situ to advise.

## 2021-12-15 NOTE — Telephone Encounter (Signed)
Pt c/o medication issue:  1. Name of Medication: Steroid Patch   2. How are you currently taking this medication (dosage and times per day)?    3. Are you having a reaction (difficulty breathing--STAT)?   4. What is your medication issue? Pt's caregiver states that they were at [facility] today and was told to call cardiologist to request a steroid patch for pain. Requesting a call back to see if this was something that Dr. Rockey Situ could call in for them. Please advise.

## 2021-12-16 NOTE — Telephone Encounter (Signed)
Yes Ortho should be fine to write for steroid patch  Thx  TG   Spoke w/ pt.  She reports that her daughter is a physical therapist and warned her that this can be quite dangerous, so the will not be taking it.  Advised her that she has that right, so will make [facility] aware of her decision.  Asked her to call back if we can be of further assistance.

## 2021-12-18 ENCOUNTER — Other Ambulatory Visit: Payer: Self-pay | Admitting: Cardiovascular Disease

## 2021-12-28 ENCOUNTER — Other Ambulatory Visit (INDEPENDENT_AMBULATORY_CARE_PROVIDER_SITE_OTHER): Payer: Self-pay | Admitting: Vascular Surgery

## 2021-12-28 DIAGNOSIS — I6523 Occlusion and stenosis of bilateral carotid arteries: Secondary | ICD-10-CM

## 2022-01-03 ENCOUNTER — Encounter (INDEPENDENT_AMBULATORY_CARE_PROVIDER_SITE_OTHER): Payer: Medicare Other

## 2022-01-03 ENCOUNTER — Ambulatory Visit (INDEPENDENT_AMBULATORY_CARE_PROVIDER_SITE_OTHER): Payer: Medicare Other | Admitting: Vascular Surgery

## 2022-01-13 ENCOUNTER — Inpatient Hospital Stay
Admission: EM | Admit: 2022-01-13 | Discharge: 2022-01-18 | DRG: 391 | Disposition: A | Discharge status: Home Health | Payer: Medicare Other | Attending: Internal Medicine | Admitting: Internal Medicine

## 2022-01-13 ENCOUNTER — Emergency Department: Payer: Medicare Other

## 2022-01-13 DIAGNOSIS — Z7902 Long term (current) use of antithrombotics/antiplatelets: Secondary | ICD-10-CM

## 2022-01-13 DIAGNOSIS — R197 Diarrhea, unspecified: Secondary | ICD-10-CM | POA: Diagnosis not present

## 2022-01-13 DIAGNOSIS — Z9079 Acquired absence of other genital organ(s): Secondary | ICD-10-CM

## 2022-01-13 DIAGNOSIS — Z20822 Contact with and (suspected) exposure to covid-19: Secondary | ICD-10-CM | POA: Diagnosis present

## 2022-01-13 DIAGNOSIS — Z90710 Acquired absence of both cervix and uterus: Secondary | ICD-10-CM

## 2022-01-13 DIAGNOSIS — Z85828 Personal history of other malignant neoplasm of skin: Secondary | ICD-10-CM

## 2022-01-13 DIAGNOSIS — R112 Nausea with vomiting, unspecified: Secondary | ICD-10-CM | POA: Diagnosis not present

## 2022-01-13 DIAGNOSIS — Z888 Allergy status to other drugs, medicaments and biological substances status: Secondary | ICD-10-CM

## 2022-01-13 DIAGNOSIS — Z79899 Other long term (current) drug therapy: Secondary | ICD-10-CM

## 2022-01-13 DIAGNOSIS — Z90722 Acquired absence of ovaries, bilateral: Secondary | ICD-10-CM

## 2022-01-13 DIAGNOSIS — E119 Type 2 diabetes mellitus without complications: Secondary | ICD-10-CM | POA: Diagnosis present

## 2022-01-13 DIAGNOSIS — M6281 Muscle weakness (generalized): Secondary | ICD-10-CM | POA: Diagnosis present

## 2022-01-13 DIAGNOSIS — Z91040 Latex allergy status: Secondary | ICD-10-CM

## 2022-01-13 DIAGNOSIS — J9601 Acute respiratory failure with hypoxia: Secondary | ICD-10-CM | POA: Diagnosis not present

## 2022-01-13 DIAGNOSIS — Z87820 Personal history of traumatic brain injury: Secondary | ICD-10-CM

## 2022-01-13 DIAGNOSIS — R159 Full incontinence of feces: Secondary | ICD-10-CM | POA: Diagnosis present

## 2022-01-13 DIAGNOSIS — E785 Hyperlipidemia, unspecified: Secondary | ICD-10-CM | POA: Diagnosis present

## 2022-01-13 DIAGNOSIS — Z91048 Other nonmedicinal substance allergy status: Secondary | ICD-10-CM

## 2022-01-13 DIAGNOSIS — Z882 Allergy status to sulfonamides status: Secondary | ICD-10-CM

## 2022-01-13 DIAGNOSIS — K5732 Diverticulitis of large intestine without perforation or abscess without bleeding: Secondary | ICD-10-CM | POA: Diagnosis not present

## 2022-01-13 DIAGNOSIS — Z87891 Personal history of nicotine dependence: Secondary | ICD-10-CM

## 2022-01-13 DIAGNOSIS — Z8673 Personal history of transient ischemic attack (TIA), and cerebral infarction without residual deficits: Secondary | ICD-10-CM

## 2022-01-13 DIAGNOSIS — I10 Essential (primary) hypertension: Secondary | ICD-10-CM | POA: Diagnosis present

## 2022-01-13 DIAGNOSIS — Z881 Allergy status to other antibiotic agents status: Secondary | ICD-10-CM

## 2022-01-13 DIAGNOSIS — K219 Gastro-esophageal reflux disease without esophagitis: Secondary | ICD-10-CM | POA: Diagnosis present

## 2022-01-13 LAB — COMPREHENSIVE METABOLIC PANEL
ALT: 11 U/L (ref 0–44)
AST: 12 U/L — ABNORMAL LOW (ref 15–41)
Albumin: 3.5 g/dL (ref 3.5–5.0)
Alkaline Phosphatase: 54 U/L (ref 38–126)
Anion gap: 9 (ref 5–15)
BUN: 18 mg/dL (ref 8–23)
CO2: 22 mmol/L (ref 22–32)
Calcium: 8.5 mg/dL — ABNORMAL LOW (ref 8.9–10.3)
Chloride: 103 mmol/L (ref 98–111)
Creatinine, Ser: 0.63 mg/dL (ref 0.44–1.00)
GFR, Estimated: >60 mL/min (ref >60)
Glucose, Bld: 178 mg/dL — ABNORMAL HIGH (ref 70–99)
Potassium: 3.7 mmol/L (ref 3.5–5.1)
Sodium: 134 mmol/L — ABNORMAL LOW (ref 135–145)
Total Bilirubin: 1.4 mg/dL — ABNORMAL HIGH (ref 0.3–1.2)
Total Protein: 6.5 g/dL (ref 6.5–8.1)

## 2022-01-13 LAB — RESP PANEL BY RT-PCR (FLU A&B, COVID) ARPGX2
Influenza A by PCR: NEGATIVE
Influenza B by PCR: NEGATIVE
SARS Coronavirus 2 by RT PCR: NEGATIVE

## 2022-01-13 LAB — CBC
HCT: 42.1 % (ref 36.0–46.0)
Hemoglobin: 13.8 g/dL (ref 12.0–15.0)
MCH: 28 pg (ref 26.0–34.0)
MCHC: 32.8 g/dL (ref 30.0–36.0)
MCV: 85.4 fL (ref 80.0–100.0)
Platelets: 202 10*3/uL (ref 150–400)
RBC: 4.93 MIL/uL (ref 3.87–5.11)
RDW: 13.3 % (ref 11.5–15.5)
WBC: 15.1 10*3/uL — ABNORMAL HIGH (ref 4.0–10.5)
nRBC: 0 % (ref 0.0–0.2)

## 2022-01-13 LAB — LIPASE, BLOOD: Lipase: 25 U/L (ref 11–51)

## 2022-01-13 MED ORDER — SODIUM CHLORIDE 0.9 % IV BOLUS
1000.0000 mL | Freq: Once | INTRAVENOUS | Status: AC
  Administered 2022-01-13: 1000 mL via INTRAVENOUS

## 2022-01-13 MED ORDER — METRONIDAZOLE 500 MG/100ML IV SOLN
500.0000 mg | Freq: Once | INTRAVENOUS | Status: AC
  Administered 2022-01-13: 500 mg via INTRAVENOUS
  Filled 2022-01-13: qty 100

## 2022-01-13 MED ORDER — ONDANSETRON HCL 4 MG/2ML IJ SOLN
4.0000 mg | Freq: Once | INTRAMUSCULAR | Status: AC
  Administered 2022-01-13: 4 mg via INTRAVENOUS
  Filled 2022-01-13: qty 2

## 2022-01-13 MED ORDER — IOHEXOL 300 MG/ML  SOLN
75.0000 mL | Freq: Once | INTRAMUSCULAR | Status: AC | PRN
  Administered 2022-01-13: 75 mL via INTRAVENOUS

## 2022-01-13 MED ORDER — ONDANSETRON 4 MG PO TBDP
4.0000 mg | ORAL_TABLET | Freq: Once | ORAL | Status: AC
  Administered 2022-01-13: 4 mg via ORAL
  Filled 2022-01-13: qty 1

## 2022-01-13 MED ORDER — CIPROFLOXACIN IN D5W 400 MG/200ML IV SOLN
400.0000 mg | Freq: Once | INTRAVENOUS | Status: AC
  Administered 2022-01-13: 400 mg via INTRAVENOUS
  Filled 2022-01-13: qty 200

## 2022-01-13 NOTE — ED Provider Notes (Signed)
[facility] Provider Note   Event Date/Time   First MD Initiated Contact with Patient 01/13/22 1950     (approximate) History  Nausea  HPI Claire Washington is a 88 y.o. female with a stated past medical history of hypertension, hyperlipidemia, type 2 diabetes, and intermittent lymphedema who presents for nausea, vomiting, and diarrhea over the last 4 days.  Patient states that the symptoms have been intermittent and began after a meal of questionable fish the patient is concerned she may have gotten food poisoning from as no one else at the same meal is her and she is the only one that is having symptoms.  Patient is having cough incontinence of stool at this point.  Patient is also endorsing bilateral lower quadrant abdominal pain ROS: Patient currently denies any vision changes, tinnitus, difficulty speaking, facial droop, sore throat, chest pain, shortness of breath, dysuria, or weakness/numbness/paresthesias in any extremity   Physical Exam  Triage Vital Signs: ED Triage Vitals  Enc Vitals Group     BP 01/13/22 1710 (!) 163/54     Pulse Rate 01/13/22 1710 94     Resp 01/13/22 1710 18     Temp 01/13/22 1710 98 F (36.7 C)     Temp Source 01/13/22 1836 Oral     SpO2 01/13/22 1710 97 %     Weight 01/13/22 1712 118 lb (53.5 kg)     Height --      Head Circumference --      Peak Flow --      Pain Score 01/13/22 1712 6     Pain Loc --      Pain Edu? --      Excl. in [place]? --    Most recent vital signs: Vitals:   01/13/22 2130 01/13/22 2234  BP: (!) 124/49 (!) 128/54  Pulse: 92 87  Resp: 18 18  Temp:    SpO2: 93% 91%   General: Awake, oriented x4. CV:  Good peripheral perfusion.  Resp:  Normal effort.  Abd:  No distention.  Mild periumbilical tenderness to palpation Other:  Elderly Caucasian female laying in bed in no acute distress ED Results / Procedures / Treatments  Labs (all labs ordered are listed, but only abnormal results are  displayed) Labs Reviewed  COMPREHENSIVE METABOLIC PANEL - Abnormal; Notable for the following components:      Result Value   Sodium 134 (*)    Glucose, Bld 178 (*)    Calcium 8.5 (*)    AST 12 (*)    Total Bilirubin 1.4 (*)    All other components within normal limits  CBC - Abnormal; Notable for the following components:   WBC 15.1 (*)    All other components within normal limits  RESP PANEL BY RT-PCR (FLU A&B, COVID) ARPGX2  CULTURE, BLOOD (ROUTINE X 2)  CULTURE, BLOOD (ROUTINE X 2)  LIPASE, BLOOD  URINALYSIS, ROUTINE W REFLEX MICROSCOPIC  LACTIC ACID, PLASMA  LACTIC ACID, PLASMA  RADIOLOGY ED MD interpretation: CT of the abdomen and pelvis with IV contrast interpreted by me and shows evidence of acute uncomplicated sigmoid diverticulitis. -Agree with radiology assessment Official radiology report(s): CT Abdomen Pelvis W Contrast  Result Date: 01/13/2022 CLINICAL DATA:  Nausea and vomiting for 2 days EXAM: CT ABDOMEN AND PELVIS WITH CONTRAST TECHNIQUE: Multidetector CT imaging of the abdomen and pelvis was performed using the standard protocol following bolus administration of intravenous contrast. RADIATION DOSE REDUCTION: This exam was performed according to  the departmental dose-optimization program which includes automated exposure control, adjustment of the mA and/or kV according to patient size and/or use of iterative reconstruction technique. CONTRAST:  17m OMNIPAQUE IOHEXOL 300 MG/ML  SOLN COMPARISON:  CT abdomen and pelvis 12/29/2014 FINDINGS: Lower chest: Mitral annular calcification. Normal heart size. No pericardial effusion. Hepatobiliary: No suspicious hepatic lesion. Cholelithiasis. No evidence of cholecystitis. No biliary dilation. Pancreas: Unremarkable. Spleen: Unremarkable. Adrenals/Urinary Tract: Unremarkable adrenal glands. Low-attenuation lesions in the kidneys are statistically likely to represent cysts. No follow-up is required. No urinary calculi or  hydronephrosis. Unremarkable bladder. Stomach/Bowel: Extensive sigmoid diverticulosis with adjacent wall thickening and inflammatory fat stranding. No drainable fluid collection or abscess. Normal caliber large and small bowel. Stomach is unremarkable. Vascular/Lymphatic: Aortic atherosclerosis. No enlarged abdominal or pelvic lymph nodes. Reproductive: Hysterectomy. Other: No free intraperitoneal air. Musculoskeletal: Thoracolumbar curve. Thoracolumbar spondylosis. No acute osseous abnormality. IMPRESSION: Acute uncomplicated sigmoid diverticulitis. Electronically Signed   By: TPlacido SouM.D.   On: 01/13/2022 22:30   PROCEDURES: Critical Care performed: No .1-3 Lead EKG Interpretation  Performed by: BNaaman Plummer MD Authorized by: BNaaman Plummer MD     Interpretation: normal     ECG rate:  88   ECG rate assessment: normal     Rhythm: sinus rhythm     Ectopy: none     Conduction: normal    MEDICATIONS ORDERED IN ED: Medications  ciprofloxacin (CIPRO) IVPB 400 mg (400 mg Intravenous New Bag/Given 01/13/22 2308)  metroNIDAZOLE (FLAGYL) IVPB 500 mg (500 mg Intravenous New Bag/Given 01/13/22 2308)  ondansetron (ZOFRAN-ODT) disintegrating tablet 4 mg (4 mg Oral Given 01/13/22 2022)  sodium chloride 0.9 % bolus 1,000 mL (1,000 mLs Intravenous New Bag/Given 01/13/22 2203)  iohexol (OMNIPAQUE) 300 MG/ML solution 75 mL (75 mLs Intravenous Contrast Given 01/13/22 2209)  sodium chloride 0.9 % bolus 1,000 mL (1,000 mLs Intravenous New Bag/Given 01/13/22 2258)  ondansetron (ZOFRAN) injection 4 mg (4 mg Intravenous Given 01/13/22 2303)   IMPRESSION / MDM / ASSESSMENT AND PLAN / ED COURSE  I reviewed the triage vital signs and the nursing notes.                             The patient is on the cardiac monitor to evaluate for evidence of arrhythmia and/or significant heart rate changes. Patient's presentation is most consistent with acute presentation with potential threat to life or bodily  function. Patient has evidence on CT of acute uncomplicated sigmoid diverticulitis Patient has no peritoneal signs or signs of perforation. Patients symptoms not typical for other emergent causes of abdominal pain such as, but not limited to, appendicitis, abdominal aortic aneurysm, surgical biliary disease, acute coronary syndrome, etc.  Given patient's history of incontinence as well as her instability on her feet and orthostatic lightheadedness without any reliable caretakers at home, patient will require admission to the internal medicine service for further evaluation and management.  Dispo: Admit to medicine   FINAL CLINICAL IMPRESSION(S) / ED DIAGNOSES   Final diagnoses:  Sigmoid diverticulitis   Rx / DC Orders   ED Discharge Orders     None      Note:  This document was prepared using Dragon voice recognition software and may include unintentional dictation errors.   BNaaman Plummer MD 01/13/22 [phone]

## 2022-01-13 NOTE — Assessment & Plan Note (Signed)
Pt continued on cipro and flagyl  MIVF. PRN tylenol.

## 2022-01-13 NOTE — Assessment & Plan Note (Signed)
Vitals:   01/13/22 1710 01/13/22 1836 01/13/22 2027 01/13/22 2130  BP: (!) 163/54 (!) 137/53 (!) 127/56 (!) 124/49   01/13/22 2234  BP: (!) 128/54  will continue toprol xl and hydralazine.  Will hold amlodipine for BP being well controlled and to avoid hypotension.

## 2022-01-13 NOTE — ED Triage Notes (Signed)
Pt arrives via EMS from home due to nausea for the last two days. Pt sts that she feels horrible. Pt placed in recliner for comfort.

## 2022-01-13 NOTE — H&P (Signed)
History and Physical    Chief Complaint: N/V/D/ Abdominal pain.   HISTORY OF PRESENT ILLNESS: Claire Washington is an 88 y.o. female  seen in ed for Admission for 4 days of nausea/vomiting/ diarrhea/.  Abd pain.CT abd showed Sigmoid diverticulitis started on t/t with IV abx- cipro flagyl. DMII. WBC count at 15. Lactic is pending. Pt thought she had gi bug and continues to have diarrhea with stool incontinence.  ROS negative for any headache or speech or gait issue , fever chills, bleeding, seizures.   Pt has PMH as below: Past Medical History:  Diagnosis Date   Arthritis    Basal cell carcinoma    basel cell   Chronic cystitis    Diabetes mellitus type 2, controlled, without complications ([place])    type 2 or unspecified type diabetes mellitus without mention of complication, not stated as uncontrolled   Disease of thyroid gland    Embolic stroke ([place])    a. 07/2016 MRI Brain: small right superior cerebellum and left lateral occipital cortex infarcts;  b. 08/2016 Echo: EF 60-65%, Gr1 DD, mild MR;  c. 08/2016 30 Day Event Monitor: No significant tachy/bradyarrhythmias. No afib.   Follicular neoplasm of thyroid    Hemorrhage of rectum and anus    Hyperlipidemia    Hyperlipidemia, unspecified    Hypertension    Leg pain    Osteoarthritis    Pernicious anemia    Small vessel disease ([place])    Spinal stenosis, lumbar region without neurogenic claudication    Subdural hematoma ([place])    a. 2013 in setting of syncope.   Syncope and collapse    a. 2013 - syncope and fall w/ resultant subdural hematoma; b. prev nl echo and stress test @ [facility].   Thyroid nodule    Vertigo & Chronic Dizziness    a. Previously seen by ENT with Physical Therapy.  MRI 2000 small vessel disease    Review Of Systems: Review of Systems  Constitutional:  Positive for fatigue.  Gastrointestinal:  Positive for abdominal pain, diarrhea, nausea and vomiting.  All other systems reviewed and are  negative.    ALLERGIES: Allergies  Allergen Reactions   Latex Dermatitis   Hydrocortisone Swelling   Sulfa Antibiotics Other (See Comments)    Reaction: unknown   Tape Dermatitis    PAST SURGICAL HISTORY: Past Surgical History:  Procedure Laterality Date   BILATERAL SALPINGOOPHORECTOMY     BREAST CYST EXCISION     ? side, yrs ago   COLONOSCOPY  01/26/2009   Diverticulosis   CYSTOSCOPY     HEMORRHOIDECTOMY BY SIMPLE LIGATION     PR COLONOSCOPY FLX W/ENDOSCOPIC MUCOSAL RESECTION  09/02/2014   Procedure: COLONOSCOPY, FLEXIBLE; WITH ENDOSCOPIC MUCOSAL RESECTION; Surgeon: Saunders Revel, MD; [facility]; Service: Gastroenterology   PR [place] FLX W/RMVL OF TUMOR POLYP LESION SNARE TQ  09/02/2014   Procedure: COLONOSCOPY FLEX; W/REMOV TUMOR/LES BY SNARE; Surgeon: Saunders Revel, MD; [facility]: [facility]; Service: Gastroenterology   SKIN SURGERY     TOTAL ABDOMINAL HYSTERECTOMY W/ BILATERAL SALPINGOOPHORECTOMY  1970s     SOCIAL HISTORY: Social History   Socioeconomic History   Marital status: Widowed    Spouse name: Not on file   Number of children: 3   Years of education: Not on file   Highest education level: Not on file  Occupational History   Not on file  Tobacco Use   Smoking status: Former    Packs/day: 0.25  Years: 20.00    Total pack years: 5.00    Types: Cigarettes    Quit date: 09/15/1993    Years since quitting: 28.3   Smokeless tobacco: Never  Vaping Use   Vaping Use: Never used  Substance and Sexual Activity   Alcohol use: Yes    Alcohol/week: 2.0 standard drinks of alcohol    Types: 1 Glasses of wine, 1 Shots of liquor per week    Comment: occassional   Drug use: No   Sexual activity: Never  Other Topics Concern   Not on file  Social History Narrative   Not on file   Social Determinants of Health   Financial Resource Strain: Not on file  Food Insecurity: Not on file  Transportation Needs: Not on  file  Physical Activity: Not on file  Stress: Not on file  Social Connections: Not on file      CURRENT MEDS:     Current Outpatient Medications (Cardiovascular):    amLODipine (NORVASC) 5 MG tablet, Take 1.5 tablets (7.5 mg total) by mouth every evening. Take 1 tablet (5 mg) in the morning and 1/2 tablet (2.5 mg) in the evening   atorvastatin (LIPITOR) 10 MG tablet, TAKE 1 TABLET BY MOUTH EVERY DAY   hydrALAZINE (APRESOLINE) 25 MG tablet, TAKE 1 TABLET BY MOUTH 3 TIMES DAILY AS NEEDED FOR SYSTOLIC BLOOD PRESSURE >563   metoprolol succinate (TOPROL-XL) 25 MG 24 hr tablet, Take 1 tablet (25 mg total) by mouth every evening. Take with or immediately following a meal.       Current Outpatient Medications (Hematological):    clopidogrel (PLAVIX) 75 MG tablet, Take 1 tablet (75 mg total) by mouth daily.   vitamin B-12 (CYANOCOBALAMIN) 1000 MCG tablet, Take 500 mcg by mouth daily.  Current Facility-Administered Medications (Other):    ciprofloxacin (CIPRO) IVPB 400 mg   metroNIDAZOLE (FLAGYL) IVPB 500 mg  Current Outpatient Medications (Other):    busPIRone (BUSPAR) 5 MG tablet, Take 5 mg by mouth 2 (two) times daily as needed. (Patient not taking: Reported on 06/01/2021)   Multiple Vitamins-Minerals (CENTRUM SILVER PO), Take 1 tablet by mouth daily. Reported on 06/11/2015   mupirocin ointment (BACTROBAN) 2 %, Apply topically 2 (two) times daily.   Vitamin D, Ergocalciferol, (DRISDOL) 1.25 MG (50000 UT) CAPS capsule, Take 50,000 Units by mouth every 7 (seven) days.    ED Course: Pt in Ed pt is alert/and gives history. She lives alone at home.  Vitals:   01/13/22 2027 01/13/22 2028 01/13/22 2130 01/13/22 2234  BP: (!) 127/56  (!) 124/49 (!) 128/54  Pulse: 86 88 92 87  Resp: '18  18 18  '$ Temp: 97.6 F (36.4 C)     TempSrc: Oral     SpO2: 94% 95% 93% 91%  Weight:       No intake/output data recorded. SpO2: 91 % Blood work in ed shows: leucocytosis/ elevation of  T.bilirubin. Results for orders placed or performed during the hospital encounter of 01/13/22 (from the past 24 hour(s))  Lipase, blood     Status: None   Collection Time: 01/13/22  5:14 PM  Result Value Ref Range   Lipase 25 11 - 51 U/L  Comprehensive metabolic panel     Status: Abnormal   Collection Time: 01/13/22  5:14 PM  Result Value Ref Range   Sodium 134 (L) 135 - 145 mmol/L   Potassium 3.7 3.5 - 5.1 mmol/L   Chloride 103 98 - 111 mmol/L   CO2 22  22 - 32 mmol/L   Glucose, Bld 178 (H) 70 - 99 mg/dL   BUN 18 8 - 23 mg/dL   Creatinine, Ser 0.63 0.44 - 1.00 mg/dL   Calcium 8.5 (L) 8.9 - 10.3 mg/dL   Total Protein 6.5 6.5 - 8.1 g/dL   Albumin 3.5 3.5 - 5.0 g/dL   AST 12 (L) 15 - 41 U/L   ALT 11 0 - 44 U/L   Alkaline Phosphatase 54 38 - 126 U/L   Total Bilirubin 1.4 (H) 0.3 - 1.2 mg/dL   GFR, Estimated >60 >60 mL/min   Anion gap 9 5 - 15  CBC     Status: Abnormal   Collection Time: 01/13/22  5:14 PM  Result Value Ref Range   WBC 15.1 (H) 4.0 - 10.5 K/uL   RBC 4.93 3.87 - 5.11 MIL/uL   Hemoglobin 13.8 12.0 - 15.0 g/dL   HCT 42.1 36.0 - 46.0 %   MCV 85.4 80.0 - 100.0 fL   MCH 28.0 26.0 - 34.0 pg   MCHC 32.8 30.0 - 36.0 g/dL   RDW 13.3 11.5 - 15.5 %   Platelets 202 150 - 400 K/uL   nRBC 0.0 0.0 - 0.2 %  Resp Panel by RT-PCR (Flu A&B, Covid) Anterior Nasal Swab     Status: None   Collection Time: 01/13/22  8:15 PM   Specimen: Anterior Nasal Swab  Result Value Ref Range   SARS Coronavirus 2 by RT PCR NEGATIVE NEGATIVE   Influenza A by PCR NEGATIVE NEGATIVE   Influenza B by PCR NEGATIVE NEGATIVE   In Ed pt received: Meds ordered this encounter  Medications   ondansetron (ZOFRAN-ODT) disintegrating tablet 4 mg   sodium chloride 0.9 % bolus 1,000 mL   iohexol (OMNIPAQUE) 300 MG/ML solution 75 mL   ciprofloxacin (CIPRO) IVPB 400 mg    Order Specific Question:   Antibiotic Indication:    Answer:   Intra-abdominal Infection   metroNIDAZOLE (FLAGYL) IVPB 500 mg    Order  Specific Question:   Antibiotic Indication:    Answer:   Intra-abdominal Infection   sodium chloride 0.9 % bolus 1,000 mL   ondansetron (ZOFRAN) injection 4 mg    Unresulted Labs (From admission, onward)     Start     Ordered   01/13/22 2327  Blood culture (routine x 2)  BLOOD CULTURE X 2,   STAT      01/13/22 2326   01/13/22 2326  Lactic acid, plasma  Now then every 2 hours,   STAT      01/13/22 2325   01/13/22 2040  Urinalysis, Routine w reflex microscopic  Once,   R        01/13/22 2040             Admission Imaging : CT Abdomen Pelvis W Contrast  Result Date: 01/13/2022 CLINICAL DATA:  Nausea and vomiting for 2 days EXAM: CT ABDOMEN AND PELVIS WITH CONTRAST TECHNIQUE: Multidetector CT imaging of the abdomen and pelvis was performed using the standard protocol following bolus administration of intravenous contrast. RADIATION DOSE REDUCTION: This exam was performed according to the departmental dose-optimization program which includes automated exposure control, adjustment of the mA and/or kV according to patient size and/or use of iterative reconstruction technique. CONTRAST:  72m OMNIPAQUE IOHEXOL 300 MG/ML  SOLN COMPARISON:  CT abdomen and pelvis 12/29/2014 FINDINGS: Lower chest: Mitral annular calcification. Normal heart size. No pericardial effusion. Hepatobiliary: No suspicious hepatic lesion. Cholelithiasis. No evidence of  cholecystitis. No biliary dilation. Pancreas: Unremarkable. Spleen: Unremarkable. Adrenals/Urinary Tract: Unremarkable adrenal glands. Low-attenuation lesions in the kidneys are statistically likely to represent cysts. No follow-up is required. No urinary calculi or hydronephrosis. Unremarkable bladder. Stomach/Bowel: Extensive sigmoid diverticulosis with adjacent wall thickening and inflammatory fat stranding. No drainable fluid collection or abscess. Normal caliber large and small bowel. Stomach is unremarkable. Vascular/Lymphatic: Aortic atherosclerosis. No  enlarged abdominal or pelvic lymph nodes. Reproductive: Hysterectomy. Other: No free intraperitoneal air. Musculoskeletal: Thoracolumbar curve. Thoracolumbar spondylosis. No acute osseous abnormality. IMPRESSION: Acute uncomplicated sigmoid diverticulitis. Electronically Signed   By: Placido Sou M.D.   On: 01/13/2022 22:30      Physical Examination: Vitals:   01/13/22 2027 01/13/22 2028 01/13/22 2130 01/13/22 2234  BP: (!) 127/56  (!) 124/49 (!) 128/54  Pulse: 86 88 92 87  Temp: 97.6 F (36.4 C)     Resp: '18  18 18  '$ Weight:      SpO2: 94% 95% 93% 91%  TempSrc: Oral      Physical Exam Vitals and nursing note reviewed.  Constitutional:      General: She is not in acute distress.    Appearance: She is not ill-appearing, toxic-appearing or diaphoretic.  HENT:     Head: Normocephalic and atraumatic.     Right Ear: Hearing and external ear normal.     Left Ear: Hearing and external ear normal.     Nose: Nose normal. No nasal deformity.     Mouth/Throat:     Lips: Pink.     Mouth: Mucous membranes are moist.     Tongue: No lesions.     Pharynx: Oropharynx is clear.  Eyes:     Extraocular Movements: Extraocular movements intact.  Neck:     Vascular: No carotid bruit.  Cardiovascular:     Rate and Rhythm: Normal rate and regular rhythm.     Pulses: Normal pulses.     Heart sounds: Normal heart sounds.  Pulmonary:     Effort: Pulmonary effort is normal.     Breath sounds: Normal breath sounds.  Abdominal:     General: Bowel sounds are normal. There is distension.     Palpations: Abdomen is soft. There is no mass.     Tenderness: There is abdominal tenderness. There is no guarding.     Hernia: No hernia is present.  Musculoskeletal:     Right lower leg: No edema.     Left lower leg: No edema.  Skin:    General: Skin is warm.  Neurological:     General: No focal deficit present.     Mental Status: She is alert and oriented to person, place, and time.     Cranial Nerves:  Cranial nerves 2-12 are intact.     Motor: Motor function is intact.  Psychiatric:        Attention and Perception: Attention normal.        Mood and Affect: Mood normal.        Speech: Speech normal.        Behavior: Behavior normal. Behavior is cooperative.        Cognition and Memory: Cognition normal.      Assessment and Plan: Sigmoid diverticulitis Pt continued on cipro and flagyl  MIVF. PRN tylenol.     Diabetes mellitus (HCC) Glycemic protocol..     Hypertension Vitals:   01/13/22 1710 01/13/22 1836 01/13/22 2027 01/13/22 2130  BP: (!) 163/54 (!) 137/53 (!) 127/56 (!) 124/49   01/13/22  2234  BP: (!) 128/54  will continue toprol xl and hydralazine.  Will hold amlodipine for BP being well controlled and to avoid hypotension.       DVT prophylaxis:  Heparin   Code Status:  Full code    Family Communication:  Leonel Ramsay (Mother)  [phone] (Mobile   Disposition Plan:  TBD   Consults called:  None   Admission status: Observation.    Unit/ Expected LOS: Med tele   Para Skeans MD [facility]  6 PM- 2 AM. Please contact me via secure Chat 6 PM-2 AM. [phone] ( Pager ) To contact the [facility] Attending or Consulting provider [place] or covering provider during after hours [place], for this patient.   Check the care team in [facility] and look for a) attending/consulting [facility] provider listed and b) the [facility] team listed Log into [email or web address] and use [place]'s universal password to access. If you do not have the password, please contact the hospital operator. Locate the [facility] provider you are looking for under [facility] and page to a number that you can be directly reached. If you still have difficulty reaching the provider, please page the [facility] (Director on Call) for the Hospitalists listed on amion for assistance. [email or web address] 01/13/2022, 11:51 PM

## 2022-01-13 NOTE — Assessment & Plan Note (Addendum)
Glycemic protocol.   

## 2022-01-14 ENCOUNTER — Other Ambulatory Visit: Payer: Self-pay

## 2022-01-14 ENCOUNTER — Encounter: Payer: Self-pay | Admitting: Internal Medicine

## 2022-01-14 DIAGNOSIS — Z881 Allergy status to other antibiotic agents status: Secondary | ICD-10-CM | POA: Diagnosis not present

## 2022-01-14 DIAGNOSIS — E785 Hyperlipidemia, unspecified: Secondary | ICD-10-CM | POA: Diagnosis present

## 2022-01-14 DIAGNOSIS — Z882 Allergy status to sulfonamides status: Secondary | ICD-10-CM | POA: Diagnosis not present

## 2022-01-14 DIAGNOSIS — Z91040 Latex allergy status: Secondary | ICD-10-CM | POA: Diagnosis not present

## 2022-01-14 DIAGNOSIS — R197 Diarrhea, unspecified: Secondary | ICD-10-CM | POA: Diagnosis not present

## 2022-01-14 DIAGNOSIS — R112 Nausea with vomiting, unspecified: Secondary | ICD-10-CM | POA: Diagnosis not present

## 2022-01-14 DIAGNOSIS — Z91048 Other nonmedicinal substance allergy status: Secondary | ICD-10-CM | POA: Diagnosis not present

## 2022-01-14 DIAGNOSIS — J9601 Acute respiratory failure with hypoxia: Secondary | ICD-10-CM | POA: Diagnosis not present

## 2022-01-14 DIAGNOSIS — K5732 Diverticulitis of large intestine without perforation or abscess without bleeding: Secondary | ICD-10-CM | POA: Diagnosis present

## 2022-01-14 DIAGNOSIS — Z85828 Personal history of other malignant neoplasm of skin: Secondary | ICD-10-CM | POA: Diagnosis not present

## 2022-01-14 DIAGNOSIS — E119 Type 2 diabetes mellitus without complications: Secondary | ICD-10-CM | POA: Diagnosis present

## 2022-01-14 DIAGNOSIS — I10 Essential (primary) hypertension: Secondary | ICD-10-CM | POA: Diagnosis present

## 2022-01-14 DIAGNOSIS — Z79899 Other long term (current) drug therapy: Secondary | ICD-10-CM | POA: Diagnosis not present

## 2022-01-14 DIAGNOSIS — Z8673 Personal history of transient ischemic attack (TIA), and cerebral infarction without residual deficits: Secondary | ICD-10-CM | POA: Diagnosis not present

## 2022-01-14 DIAGNOSIS — M6281 Muscle weakness (generalized): Secondary | ICD-10-CM | POA: Diagnosis present

## 2022-01-14 DIAGNOSIS — Z20822 Contact with and (suspected) exposure to covid-19: Secondary | ICD-10-CM | POA: Diagnosis present

## 2022-01-14 DIAGNOSIS — R159 Full incontinence of feces: Secondary | ICD-10-CM | POA: Diagnosis present

## 2022-01-14 DIAGNOSIS — Z87820 Personal history of traumatic brain injury: Secondary | ICD-10-CM | POA: Diagnosis not present

## 2022-01-14 DIAGNOSIS — Z90722 Acquired absence of ovaries, bilateral: Secondary | ICD-10-CM | POA: Diagnosis not present

## 2022-01-14 DIAGNOSIS — K219 Gastro-esophageal reflux disease without esophagitis: Secondary | ICD-10-CM | POA: Diagnosis present

## 2022-01-14 DIAGNOSIS — Z888 Allergy status to other drugs, medicaments and biological substances status: Secondary | ICD-10-CM | POA: Diagnosis not present

## 2022-01-14 DIAGNOSIS — Z7902 Long term (current) use of antithrombotics/antiplatelets: Secondary | ICD-10-CM | POA: Diagnosis not present

## 2022-01-14 DIAGNOSIS — Z9079 Acquired absence of other genital organ(s): Secondary | ICD-10-CM | POA: Diagnosis not present

## 2022-01-14 DIAGNOSIS — Z87891 Personal history of nicotine dependence: Secondary | ICD-10-CM | POA: Diagnosis not present

## 2022-01-14 DIAGNOSIS — Z90710 Acquired absence of both cervix and uterus: Secondary | ICD-10-CM | POA: Diagnosis not present

## 2022-01-14 LAB — COMPREHENSIVE METABOLIC PANEL
ALT: 11 U/L (ref 0–44)
AST: 10 U/L — ABNORMAL LOW (ref 15–41)
Albumin: 2.9 g/dL — ABNORMAL LOW (ref 3.5–5.0)
Alkaline Phosphatase: 52 U/L (ref 38–126)
Anion gap: 4 — ABNORMAL LOW (ref 5–15)
BUN: 11 mg/dL (ref 8–23)
CO2: 24 mmol/L (ref 22–32)
Calcium: 7.9 mg/dL — ABNORMAL LOW (ref 8.9–10.3)
Chloride: 110 mmol/L (ref 98–111)
Creatinine, Ser: 0.59 mg/dL (ref 0.44–1.00)
GFR, Estimated: >60 mL/min (ref >60)
Glucose, Bld: 123 mg/dL — ABNORMAL HIGH (ref 70–99)
Potassium: 3.6 mmol/L (ref 3.5–5.1)
Sodium: 138 mmol/L (ref 135–145)
Total Bilirubin: 1.1 mg/dL (ref 0.3–1.2)
Total Protein: 5.6 g/dL — ABNORMAL LOW (ref 6.5–8.1)

## 2022-01-14 LAB — URINALYSIS, ROUTINE W REFLEX MICROSCOPIC
Bacteria, UA: NONE SEEN
Bilirubin Urine: NEGATIVE
Glucose, UA: NEGATIVE mg/dL
Hgb urine dipstick: NEGATIVE
Ketones, ur: 5 mg/dL — AB
Leukocytes,Ua: NEGATIVE
Nitrite: NEGATIVE
Protein, ur: 30 mg/dL — AB
Specific Gravity, Urine: 1.025 (ref 1.005–1.030)
pH: 6 (ref 5.0–8.0)

## 2022-01-14 LAB — CBC
HCT: 39.7 % (ref 36.0–46.0)
Hemoglobin: 13 g/dL (ref 12.0–15.0)
MCH: 28.2 pg (ref 26.0–34.0)
MCHC: 32.7 g/dL (ref 30.0–36.0)
MCV: 86.1 fL (ref 80.0–100.0)
Platelets: 164 10*3/uL (ref 150–400)
RBC: 4.61 MIL/uL (ref 3.87–5.11)
RDW: 13.5 % (ref 11.5–15.5)
WBC: 12.2 10*3/uL — ABNORMAL HIGH (ref 4.0–10.5)
nRBC: 0 % (ref 0.0–0.2)

## 2022-01-14 LAB — TSH: TSH: 1.841 u[IU]/mL (ref 0.350–4.500)

## 2022-01-14 LAB — LACTIC ACID, PLASMA: Lactic Acid, Venous: 0.8 mmol/L (ref 0.5–1.9)

## 2022-01-14 LAB — T4, FREE: Free T4: 0.84 ng/dL (ref 0.61–1.12)

## 2022-01-14 MED ORDER — METOPROLOL SUCCINATE ER 25 MG PO TB24: 25.0000 mg | ORAL_TABLET | Freq: Every evening | ORAL | Status: DC

## 2022-01-14 MED ORDER — MORPHINE SULFATE (PF) 2 MG/ML IV SOLN
2.0000 mg | INTRAVENOUS | Status: DC | PRN
  Administered 2022-01-16 (×2): 2 mg via INTRAVENOUS
  Filled 2022-01-14 (×2): qty 1

## 2022-01-14 MED ORDER — ACETAMINOPHEN 650 MG RE SUPP: 650.0000 mg | Freq: Four times a day (QID) | RECTAL | Status: DC | PRN

## 2022-01-14 MED ORDER — SODIUM CHLORIDE 0.9 % IV SOLN: INTRAVENOUS | Status: AC

## 2022-01-14 MED ORDER — ONDANSETRON HCL 4 MG/2ML IJ SOLN
4.0000 mg | Freq: Once | INTRAMUSCULAR | Status: AC
  Administered 2022-01-14: 4 mg via INTRAVENOUS
  Filled 2022-01-14: qty 2

## 2022-01-14 MED ORDER — METRONIDAZOLE 500 MG/100ML IV SOLN
500.0000 mg | Freq: Three times a day (TID) | INTRAVENOUS | Status: DC
  Administered 2022-01-14 – 2022-01-17 (×9): 500 mg via INTRAVENOUS
  Filled 2022-01-14 (×10): qty 100

## 2022-01-14 MED ORDER — AMLODIPINE BESYLATE 5 MG PO TABS
7.5000 mg | ORAL_TABLET | Freq: Every evening | ORAL | Status: DC
  Administered 2022-01-14 – 2022-01-17 (×3): 7.5 mg via ORAL
  Filled 2022-01-14 (×3): qty 2

## 2022-01-14 MED ORDER — SODIUM CHLORIDE 0.9 % IV SOLN
12.5000 mg | Freq: Four times a day (QID) | INTRAVENOUS | Status: AC | PRN
  Administered 2022-01-14: 12.5 mg via INTRAVENOUS
  Filled 2022-01-14: qty 0.5

## 2022-01-14 MED ORDER — HYDROCODONE-ACETAMINOPHEN 5-325 MG PO TABS
1.0000 | ORAL_TABLET | ORAL | Status: DC | PRN
  Administered 2022-01-15 – 2022-01-17 (×12): 1 via ORAL
  Filled 2022-01-14 (×13): qty 1

## 2022-01-14 MED ORDER — ACETAMINOPHEN 325 MG PO TABS
650.0000 mg | ORAL_TABLET | Freq: Four times a day (QID) | ORAL | Status: DC | PRN
  Administered 2022-01-14 – 2022-01-17 (×2): 650 mg via ORAL
  Filled 2022-01-14 (×2): qty 2

## 2022-01-14 MED ORDER — PANTOPRAZOLE SODIUM 40 MG IV SOLR
40.0000 mg | Freq: Two times a day (BID) | INTRAVENOUS | Status: DC
  Administered 2022-01-14 – 2022-01-17 (×8): 40 mg via INTRAVENOUS
  Filled 2022-01-14 (×8): qty 10

## 2022-01-14 MED ORDER — CIPROFLOXACIN IN D5W 400 MG/200ML IV SOLN
400.0000 mg | Freq: Two times a day (BID) | INTRAVENOUS | Status: DC
  Administered 2022-01-14 – 2022-01-17 (×7): 400 mg via INTRAVENOUS
  Filled 2022-01-14 (×7): qty 200

## 2022-01-14 MED ORDER — ATORVASTATIN CALCIUM 10 MG PO TABS
10.0000 mg | ORAL_TABLET | Freq: Every day | ORAL | Status: DC
  Filled 2022-01-14 (×3): qty 1

## 2022-01-14 MED ORDER — HEPARIN SODIUM (PORCINE) 5000 UNIT/ML IJ SOLN
5000.0000 [IU] | Freq: Two times a day (BID) | INTRAMUSCULAR | Status: DC
  Administered 2022-01-14 – 2022-01-18 (×10): 5000 [IU] via SUBCUTANEOUS
  Filled 2022-01-14 (×10): qty 1

## 2022-01-14 MED ORDER — CLOPIDOGREL BISULFATE 75 MG PO TABS
75.0000 mg | ORAL_TABLET | Freq: Every day | ORAL | Status: DC
  Administered 2022-01-14 – 2022-01-18 (×5): 75 mg via ORAL
  Filled 2022-01-14 (×5): qty 1

## 2022-01-14 MED ORDER — SODIUM CHLORIDE 0.9% FLUSH
3.0000 mL | Freq: Two times a day (BID) | INTRAVENOUS | Status: DC
  Administered 2022-01-14 – 2022-01-18 (×6): 3 mL via INTRAVENOUS

## 2022-01-14 NOTE — Progress Notes (Signed)
PROGRESS NOTE    Claire Washington  ZDG:[id number] DOB: October 01, 1924 DOA: 01/13/2022 PCP: Tracie Harrier, MD    Brief Narrative:  This 88 years old female with PMH significant for arthritis, basal cell carcinoma, type 2 diabetes, history of embolic stroke, hyperlipidemia, essential hypertension, history of subdural hematoma, presented in the ED with complaints of nausea, vomiting, diarrhea and abdominal pain for 4 days.  Work-up in the ED reveals sigmoid diverticulitis.  Patient is started on IV antibiotics and admitted for further management.  Assessment & Plan:   Principal Problem:   Nausea vomiting and diarrhea Active Problems:   Hypertension   Diabetes mellitus ([place])   Sigmoid diverticulitis   Acute sigmoid diverticulitis: Patient presented with abdominal pain, nausea vomiting and diarrhea for 4 days. CT abdomen and pelvis consistent with acute sigmoid diverticulitis. Patient started on empiric antibiotics ciprofloxacin and Flagyl. Continue IV fluids.  Continue Zofran as needed for nausea and vomiting. Lactic acid 0.8, No signs of sepsis.  Diabetes mellitus type 2: Diet controlled.  Carb modified diet.  Essential hypertension: Continue amlodipine and metoprolol.  Hyperlipidemia: Continue atorvastatin.  GERD: Continue pantoprazole 40 mg daily  DVT prophylaxis: Heparin Code Status: Full code Family Communication: No family at bed side. Disposition Plan:    Status is: Inpatient Remains inpatient appropriate because: Admitted for acute sigmoid diverticulitis requiring IV antibiotics.  Anticipated discharge home in few days.   Consultants:  None  Procedures: CT abdomen and pelvis Antimicrobials: Ciprofloxacin and Flagyl  Subjective: Patient was seen and examined at bedside.  Overnight events noted.   She still reports having some soreness but reports feeling better, reports diarrhea is improved.   Started on clear liquid diet.  Objective: Vitals:    01/14/22 0232 01/14/22 0325 01/14/22 0337 01/14/22 0800  BP: (!) 122/48  (!) 175/61 (!) 135/53  Pulse: 78  92 70  Resp: (!) '24  18 16  '$ Temp: 98.1 F (36.7 C)  98.1 F (36.7 C) 97.6 F (36.4 C)  TempSrc: Oral   Oral  SpO2: 98%  95% 98%  Weight:  53.2 kg    Height:  5' (1.524 m)      Intake/Output Summary (Last 24 hours) at 01/14/2022 1342 Last data filed at 01/14/2022 1037 Gross per 24 hour  Intake 421.57 ml  Output --  Net 421.57 ml   Filed Weights   01/13/22 1712 01/14/22 0325  Weight: 53.5 kg 53.2 kg    Examination:  General exam: Appears comfortable, not in any acute distress.  Deconditioned Respiratory system: CTA bilaterally, respiratory effort normal, RR 15 Cardiovascular system: S1 & S2 heard, regular rate and rhythm, no murmur. Gastrointestinal system: Abdomen soft, non distended, LLQ tenderness noted, BS+ heard. Central nervous system: Alert and oriented x 3. No focal neurological deficits. Extremities: No edema, no cyanosis, no clubbing. Skin: No rashes, lesions or ulcers Psychiatry: Judgement and insight appear normal. Mood & affect appropriate.     Data Reviewed: I have personally reviewed following labs and imaging studies  CBC: Recent Labs  Lab 01/13/22 1714 01/14/22 0651  WBC 15.1* 12.2*  HGB 13.8 13.0  HCT 42.1 39.7  MCV 85.4 86.1  PLT 202 595   Basic Metabolic Panel: Recent Labs  Lab 01/13/22 1714 01/14/22 0651  NA 134* 138  K 3.7 3.6  CL 103 110  CO2 22 24  GLUCOSE 178* 123*  BUN 18 11  CREATININE 0.63 0.59  CALCIUM 8.5* 7.9*   GFR: Estimated Creatinine Clearance: 30.2 mL/min (by C-G formula  based on SCr of 0.59 mg/dL). Liver Function Tests: Recent Labs  Lab 01/13/22 1714 01/14/22 0651  AST 12* 10*  ALT 11 11  ALKPHOS 54 52  BILITOT 1.4* 1.1  PROT 6.5 5.6*  ALBUMIN 3.5 2.9*   Recent Labs  Lab 01/13/22 1714  LIPASE 25   No results for input(s): "AMMONIA" in the last 168 hours. Coagulation Profile: No results for  input(s): "INR", "PROTIME" in the last 168 hours. Cardiac Enzymes: No results for input(s): "CKTOTAL", "CKMB", "CKMBINDEX", "TROPONINI" in the last 168 hours. BNP (last 3 results) No results for input(s): "PROBNP" in the last 8760 hours. HbA1C: No results for input(s): "HGBA1C" in the last 72 hours. CBG: No results for input(s): "GLUCAP" in the last 168 hours. Lipid Profile: No results for input(s): "CHOL", "HDL", "LDLCALC", "TRIG", "CHOLHDL", "LDLDIRECT" in the last 72 hours. Thyroid Function Tests: Recent Labs    01/14/22 0651  TSH 1.841  FREET4 0.84   Anemia Panel: No results for input(s): "VITAMINB12", "FOLATE", "FERRITIN", "TIBC", "IRON", "RETICCTPCT" in the last 72 hours. Sepsis Labs: Recent Labs  Lab 01/14/22 0651  LATICACIDVEN 0.8    Recent Results (from the past 240 hour(s))  Resp Panel by RT-PCR (Flu A&B, Covid) Anterior Nasal Swab     Status: None   Collection Time: 01/13/22  8:15 PM   Specimen: Anterior Nasal Swab  Result Value Ref Range Status   SARS Coronavirus 2 by RT PCR NEGATIVE NEGATIVE Final    Comment: (NOTE) SARS-CoV-2 target nucleic acids are NOT DETECTED.  The SARS-CoV-2 RNA is generally detectable in upper respiratory specimens during the acute phase of infection. The lowest concentration of SARS-CoV-2 viral copies this assay can detect is 138 copies/mL. A negative result does not preclude SARS-Cov-2 infection and should not be used as the sole basis for treatment or other patient management decisions. A negative result may occur with  improper specimen collection/handling, submission of specimen other than nasopharyngeal swab, presence of viral mutation(s) within the areas targeted by this assay, and inadequate number of viral copies(<138 copies/mL). A negative result must be combined with clinical observations, patient history, and epidemiological information. The expected result is Negative.  Fact Sheet for Patients:   EntrepreneurPulse.com.au  Fact Sheet for Healthcare Providers:  IncredibleEmployment.be  This test is no t yet approved or cleared by the [place] FDA and  has been authorized for detection and/or diagnosis of SARS-CoV-2 by FDA under an Emergency Use Authorization (EUA). This EUA will remain  in effect (meaning this test can be used) for the duration of the COVID-19 declaration under Section 564(b)(1) of the Act, 21 U.S.C.section 360bbb-3(b)(1), unless the authorization is terminated  or revoked sooner.       Influenza A by PCR NEGATIVE NEGATIVE Final   Influenza B by PCR NEGATIVE NEGATIVE Final    Comment: (NOTE) The Xpert Xpress SARS-CoV-2/FLU/RSV plus assay is intended as an aid in the diagnosis of influenza from Nasopharyngeal swab specimens and should not be used as a sole basis for treatment. Nasal washings and aspirates are unacceptable for Xpert Xpress SARS-CoV-2/FLU/RSV testing.  Fact Sheet for Patients: EntrepreneurPulse.com.au  Fact Sheet for Healthcare Providers: IncredibleEmployment.be  This test is not yet approved or cleared by the [place] FDA and has been authorized for detection and/or diagnosis of SARS-CoV-2 by FDA under an Emergency Use Authorization (EUA). This EUA will remain in effect (meaning this test can be used) for the duration of the COVID-19 declaration under Section 564(b)(1) of the Act, 21 U.S.C.  section 360bbb-3(b)(1), unless the authorization is terminated or revoked.  Performed at [facility], [address], [place], [place] [address]     Radiology Studies: CT Abdomen Pelvis W Contrast  Result Date: 01/13/2022 CLINICAL DATA:  Nausea and vomiting for 2 days EXAM: CT ABDOMEN AND PELVIS WITH CONTRAST TECHNIQUE: Multidetector CT imaging of the abdomen and pelvis was performed using the standard protocol following bolus administration of intravenous  contrast. RADIATION DOSE REDUCTION: This exam was performed according to the departmental dose-optimization program which includes automated exposure control, adjustment of the mA and/or kV according to patient size and/or use of iterative reconstruction technique. CONTRAST:  17m OMNIPAQUE IOHEXOL 300 MG/ML  SOLN COMPARISON:  CT abdomen and pelvis 12/29/2014 FINDINGS: Lower chest: Mitral annular calcification. Normal heart size. No pericardial effusion. Hepatobiliary: No suspicious hepatic lesion. Cholelithiasis. No evidence of cholecystitis. No biliary dilation. Pancreas: Unremarkable. Spleen: Unremarkable. Adrenals/Urinary Tract: Unremarkable adrenal glands. Low-attenuation lesions in the kidneys are statistically likely to represent cysts. No follow-up is required. No urinary calculi or hydronephrosis. Unremarkable bladder. Stomach/Bowel: Extensive sigmoid diverticulosis with adjacent wall thickening and inflammatory fat stranding. No drainable fluid collection or abscess. Normal caliber large and small bowel. Stomach is unremarkable. Vascular/Lymphatic: Aortic atherosclerosis. No enlarged abdominal or pelvic lymph nodes. Reproductive: Hysterectomy. Other: No free intraperitoneal air. Musculoskeletal: Thoracolumbar curve. Thoracolumbar spondylosis. No acute osseous abnormality. IMPRESSION: Acute uncomplicated sigmoid diverticulitis. Electronically Signed   By: TPlacido SouM.D.   On: 01/13/2022 22:30    Scheduled Meds:  amLODipine  7.5 mg Oral QPM   atorvastatin  10 mg Oral Daily   clopidogrel  75 mg Oral Daily   heparin  5,000 Units Subcutaneous Q12H   metoprolol succinate  25 mg Oral QPM   pantoprazole (PROTONIX) IV  40 mg Intravenous Q12H   sodium chloride flush  3 mL Intravenous Q12H   Continuous Infusions:  sodium chloride Stopped (01/14/22 0430)   ciprofloxacin 400 mg (01/14/22 1041)     LOS: 0 days    Time spent: 50 mins    Claire Klimaszewski, MD [facility]   If 7PM-7AM,  please contact night-coverage

## 2022-01-14 NOTE — Plan of Care (Signed)

## 2022-01-15 DIAGNOSIS — R112 Nausea with vomiting, unspecified: Secondary | ICD-10-CM | POA: Diagnosis not present

## 2022-01-15 DIAGNOSIS — R197 Diarrhea, unspecified: Secondary | ICD-10-CM | POA: Diagnosis not present

## 2022-01-15 LAB — CBC
HCT: 36.5 % (ref 36.0–46.0)
Hemoglobin: 11.8 g/dL — ABNORMAL LOW (ref 12.0–15.0)
MCH: 28.1 pg (ref 26.0–34.0)
MCHC: 32.3 g/dL (ref 30.0–36.0)
MCV: 86.9 fL (ref 80.0–100.0)
Platelets: 162 K/uL (ref 150–400)
RBC: 4.2 MIL/uL (ref 3.87–5.11)
RDW: 13.7 % (ref 11.5–15.5)
WBC: 8.3 K/uL (ref 4.0–10.5)
nRBC: 0 % (ref 0.0–0.2)

## 2022-01-15 MED ORDER — SENNA 8.6 MG PO TABS
1.0000 | ORAL_TABLET | Freq: Every day | ORAL | Status: DC
  Administered 2022-01-15 – 2022-01-18 (×4): 8.6 mg via ORAL
  Filled 2022-01-15 (×4): qty 1

## 2022-01-15 MED ORDER — SODIUM CHLORIDE 0.9 % IV SOLN: INTRAVENOUS | Status: DC

## 2022-01-15 MED ORDER — IPRATROPIUM-ALBUTEROL 0.5-2.5 (3) MG/3ML IN SOLN
3.0000 mL | Freq: Four times a day (QID) | RESPIRATORY_TRACT | Status: DC | PRN
  Administered 2022-01-15 – 2022-01-17 (×4): 3 mL via RESPIRATORY_TRACT
  Filled 2022-01-15 (×5): qty 3

## 2022-01-15 NOTE — Evaluation (Signed)
Physical Therapy Evaluation Patient Details Name: Claire Washington MRN: [id number] DOB: October 07, 1924 Today's Date: 01/15/2022  History of Present Illness  Pt is a 88 y/o F admitted on 01/13/22 after presenting to the ED with c/o N&V & diarrhea, as well as abdominal pain, x 4 days. Work-up reveals sigmoid diverticulitis. PMH arthritis, basal cell carcinoma, DM2, embolic stroke, HLD, essential HTN, SDH  Clinical Impression  Pt seen for PT evaluation with pt agreeable to tx. Pt endorses pain in BLE, neck & back despite being premedicated. Pt reports prior to admission she was mod I with rollator in an ILF, only receiving assistance for transportation & cleaning & hasn't had a fall in ~8 months. On this date, pt is unable to complete supine>sit & PT provides cuing for log rolling with pt requiring mod assist to complete rolling & sidelying sitting with HOB slightly elevated & use of bed rails. Pt requires cuing for safe hand placement & mod assist for STS from [place]. Pt demonstrates significant shaking/jerking posteriorly upon standing. Pt eventually reports she has chronic dizziness during STS 2/2 hx of strokes. Pt requires cuing to open eyes & focused on pursed lip breathing & slight improvement in balance noted. Pt progressed to step pivot bed>recliner with RW & min assist but unable to ambulate further so after rest break pt performed standing marching in place with cuing for increased BLE hip/knee flexion. Pt tolerates marching ~30 seconds before requiring seated rest break. Pt requires seated rest break to increased SpO2 2/2 dropping as low as 84% with mobility. At this time pt is unsafe to d/c home alone. Pt would benefit from STR upon d/c to maximize independence with functional mobility & reduce fall risk prior to return home.    Recommendations for follow up therapy are one component of a multi-disciplinary discharge planning process, led by the attending physician.  Recommendations may be  updated based on patient status, additional functional criteria and insurance authorization.  Follow Up Recommendations Skilled nursing-short term rehab (<3 hours/day) Can patient physically be transported by private vehicle: No    Assistance Recommended at Discharge Frequent or constant Supervision/Assistance  Patient can return home with the following  A lot of help with bathing/dressing/bathroom;A lot of help with walking and/or transfers;Assist for transportation;Assistance with cooking/housework;Help with stairs or ramp for entrance    Equipment Recommendations None recommended by PT  Recommendations for Other Services       Functional Status Assessment Patient has had a recent decline in their functional status and demonstrates the ability to make significant improvements in function in a reasonable and predictable amount of time.     Precautions / Restrictions Precautions Precautions: Fall Restrictions Weight Bearing Restrictions: No      Mobility  Bed Mobility Overal bed mobility: Needs Assistance Bed Mobility: Rolling, Sidelying to Sit Rolling: Mod assist Sidelying to sit: Mod assist       General bed mobility comments: cuing for log rolling, to reach for bed rail with UE, assistance to roll & upright trunk during sidelying>sitting, cuing to push with 1 hand & other elbow to attempt to upright her trunk    Transfers Overall transfer level: Needs assistance Equipment used: Rolling walker (2 wheels) Transfers: Sit to/from Stand, Bed to chair/wheelchair/BSC Sit to Stand: Mod assist (pt with good ability to scoot out to edge of seat, does require educational cuing re: safe hand placement to push to standing)   Step pivot transfers: Min assist  Ambulation/Gait                  Stairs            Wheelchair Mobility    Modified Rankin (Stroke Patients Only)       Balance Overall balance assessment: Needs  assistance Sitting-balance support: Feet supported, Bilateral upper extremity supported Sitting balance-Leahy Scale: Fair Sitting balance - Comments: close supervision static sitting   Standing balance support: During functional activity, Reliant on assistive device for balance, Bilateral upper extremity supported Standing balance-Leahy Scale: Poor                               Pertinent Vitals/Pain Pain Assessment Pain Assessment: Faces Faces Pain Scale: Hurts little more Pain Location: BLE thighs, neck, back Pain Descriptors / Indicators: Aching, Discomfort Pain Intervention(s): Premedicated before session, Monitored during session, Repositioned, Limited activity within patient's tolerance    Home Living Family/patient expects to be discharged to:: Private residence (ILF at within [place]) Living Arrangements: Alone   Type of Home: Apartment Home Access: Level entry       Home Layout: One level Home Equipment: Rollator (4 wheels);Rolling Walker (2 wheels)      Prior Function Prior Level of Function : Independent/Modified Independent             Mobility Comments: Pt reports she's mod I with rollator & hasn't fallen in 8 months. No longer drives. Pt reports hx of chronic dizziness upon STS at baseline 2/2 hx of strokes. ADLs Comments: Cooks for herself, has someone to assist with cleaning.     Hand Dominance        Extremity/Trunk Assessment   Upper Extremity Assessment Upper Extremity Assessment: Generalized weakness    Lower Extremity Assessment Lower Extremity Assessment: Generalized weakness       Communication   Communication: HOH  Cognition Arousal/Alertness: Awake/alert Behavior During Therapy: WFL for tasks assessed/performed, Anxious Overall Cognitive Status: Within Functional Limits for tasks assessed                                          General Comments General comments (skin integrity, edema,  etc.): Pt received on 1.5L/min via nasal cannula, increased to 2L/min after transfer. SpO2 as low as 84% after transfer with PT providing cuing re: pursed lip breathing with pt requiring 2-3 minutes to increase SpO2 to >/= 90%.    Exercises     Assessment/Plan    PT Assessment Patient needs continued PT services  PT Problem List Decreased strength;Decreased coordination;Cardiopulmonary status limiting activity;Pain;Decreased range of motion;Decreased balance;Decreased mobility;Decreased activity tolerance;Decreased safety awareness;Decreased knowledge of use of DME       PT Treatment Interventions Therapeutic exercise;Gait training;DME instruction;Balance training;Neuromuscular re-education;Functional mobility training;Therapeutic activities;Patient/family education;Manual techniques;Modalities    PT Goals (Current goals can be found in the Care Plan section)  Acute Rehab PT Goals Patient Stated Goal: get better PT Goal Formulation: With patient Time For Goal Achievement: 01/29/22 Potential to Achieve Goals: Good    Frequency Min 2X/week     Co-evaluation               AM-PAC PT "6 Clicks" Mobility  Outcome Measure Help needed turning from your back to your side while in a flat bed without using bedrails?: A Lot Help needed moving from lying on  your back to sitting on the side of a flat bed without using bedrails?: A Lot Help needed moving to and from a bed to a chair (including a wheelchair)?: A Little Help needed standing up from a chair using your arms (e.g., wheelchair or bedside chair)?: A Lot Help needed to walk in hospital room?: A Lot Help needed climbing 3-5 steps with a railing? : Total 6 Click Score: 12    End of Session Equipment Utilized During Treatment: Oxygen Activity Tolerance: Patient limited by fatigue;Patient limited by pain (limited by low SpO2)   Nurse Communication: Mobility status (O2) PT Visit Diagnosis: Unsteadiness on feet (R26.81);Difficulty  in walking, not elsewhere classified (R26.2);Muscle weakness (generalized) (M62.81);Other abnormalities of gait and mobility (R26.89)    Time: 6837-2902 PT Time Calculation (min) (ACUTE ONLY): 16 min   Charges:   PT Evaluation $PT Eval Moderate Complexity: 1 Mod PT Treatments $Therapeutic Activity: 8-22 mins        Lavone Nian, PT, DPT 01/15/22, 10:50 AM   Waunita Schooner 01/15/2022, 10:48 AM

## 2022-01-15 NOTE — Progress Notes (Signed)
Patient informed this nurse that she does not take metoprolol at home since it was discontinued by her Dr. She refused to take it last night. MD Dwyane Dee made aware. Per MD ok to DC metoprolol.

## 2022-01-15 NOTE — Progress Notes (Signed)
Patient's urine output 50 mls since 10 am. Bladder scan showed 29 mls. MD Dwyane Dee made aware and received new orders to start fluids IV. See [facility]

## 2022-01-15 NOTE — Progress Notes (Signed)
PROGRESS NOTE    Mileah Hemmer Gadbois  UVO:[id number] DOB: 22-Sep-1924 DOA: 01/13/2022 PCP: Tracie Harrier, MD    Brief Narrative:  This 88 years old female with PMH significant for arthritis, basal cell carcinoma, type 2 diabetes, history of embolic stroke, hyperlipidemia, essential hypertension, history of subdural hematoma, presented in the ED with complaints of nausea, vomiting, diarrhea and abdominal pain for 4 days.  Work-up in the ED reveals sigmoid diverticulitis.  Patient is started on IV antibiotics and admitted for further management.  Assessment & Plan:   Principal Problem:   Nausea vomiting and diarrhea Active Problems:   Hypertension   Diabetes mellitus ([place])   Sigmoid diverticulitis  Acute sigmoid diverticulitis: Patient presented with abdominal pain, nausea, vomiting and diarrhea for 4 days. CT abdomen and pelvis consistent with acute sigmoid diverticulitis. Patient started on empiric antibiotics ( ciprofloxacin and Flagyl) Continue IV fluids.  Continue Zofran as needed for nausea and vomiting. Lactic acid 0.8, No signs of sepsis.  Diabetes mellitus type 2: Diet controlled.  Carb modified diet.  Essential hypertension: Continue amlodipine and metoprolol.  Hyperlipidemia: Continue atorvastatin.  GERD: Continue pantoprazole 40 mg daily.  Generalized weakness: PT and OT recommended SNF.  DVT prophylaxis: Heparin Code Status: Full code Family Communication: No family at bed side. Disposition Plan:    Status is: Inpatient Remains inpatient appropriate because: Admitted for acute sigmoid diverticulitis requiring IV antibiotics.  Anticipated discharge home in few days.   Consultants:  None  Procedures: CT abdomen and pelvis Antimicrobials: Ciprofloxacin and Flagyl  Subjective: Patient was seen and examined at bedside.  Overnight events noted.   Patient reports pain and pressure in the pelvic area.  She is able to urinate. Bladder scan no residual  seen.  Patient tolerated clear liquid diet advance to full liquids.  Objective: Vitals:   01/14/22 1914 01/15/22 0432 01/15/22 0500 01/15/22 0808  BP: (!) 140/59 (!) 130/50  (!) 132/59  Pulse: 80 79  74  Resp: '16 20  16  '$ Temp: 98 F (36.7 C) (!) 97.5 F (36.4 C)  97.6 F (36.4 C)  TempSrc: Oral Oral  Oral  SpO2: 96% 95%  93%  Weight:   54.5 kg   Height:        Intake/Output Summary (Last 24 hours) at 01/15/2022 1244 Last data filed at 01/15/2022 0500 Gross per 24 hour  Intake 1159.77 ml  Output 950 ml  Net 209.77 ml   Filed Weights   01/13/22 1712 01/14/22 0325 01/15/22 0500  Weight: 53.5 kg 53.2 kg 54.5 kg    Examination:  General exam: Appears comfortable, not in any acute distress, deconditioned. Respiratory system: CTA bilaterally, respiratory effort normal, RR 15 Cardiovascular system: S1-S2 heard, regular rate and rhythm, no murmur. Gastrointestinal system: Abdomen is soft, nondistended, LLQ tenderness noted, BS+ Central nervous system: Alert and oriented x 3. No focal neurological deficits. Extremities: No edema, no cyanosis, no clubbing. Skin: No rashes, lesions or ulcers Psychiatry: Judgement and insight appear normal. Mood & affect appropriate.     Data Reviewed: I have personally reviewed following labs and imaging studies  CBC: Recent Labs  Lab 01/13/22 1714 01/14/22 0651 01/15/22 0603  WBC 15.1* 12.2* 8.3  HGB 13.8 13.0 11.8*  HCT 42.1 39.7 36.5  MCV 85.4 86.1 86.9  PLT 202 164 742   Basic Metabolic Panel: Recent Labs  Lab 01/13/22 1714 01/14/22 0651  NA 134* 138  K 3.7 3.6  CL 103 110  CO2 22 24  GLUCOSE 178* 123*  BUN 18 11  CREATININE 0.63 0.59  CALCIUM 8.5* 7.9*   GFR: Estimated Creatinine Clearance: 30.2 mL/min (by C-G formula based on SCr of 0.59 mg/dL). Liver Function Tests: Recent Labs  Lab 01/13/22 1714 01/14/22 0651  AST 12* 10*  ALT 11 11  ALKPHOS 54 52  BILITOT 1.4* 1.1  PROT 6.5 5.6*  ALBUMIN 3.5 2.9*    Recent Labs  Lab 01/13/22 1714  LIPASE 25   No results for input(s): "AMMONIA" in the last 168 hours. Coagulation Profile: No results for input(s): "INR", "PROTIME" in the last 168 hours. Cardiac Enzymes: No results for input(s): "CKTOTAL", "CKMB", "CKMBINDEX", "TROPONINI" in the last 168 hours. BNP (last 3 results) No results for input(s): "PROBNP" in the last 8760 hours. HbA1C: No results for input(s): "HGBA1C" in the last 72 hours. CBG: No results for input(s): "GLUCAP" in the last 168 hours. Lipid Profile: No results for input(s): "CHOL", "HDL", "LDLCALC", "TRIG", "CHOLHDL", "LDLDIRECT" in the last 72 hours. Thyroid Function Tests: Recent Labs    01/14/22 0651  TSH 1.841  FREET4 0.84   Anemia Panel: No results for input(s): "VITAMINB12", "FOLATE", "FERRITIN", "TIBC", "IRON", "RETICCTPCT" in the last 72 hours. Sepsis Labs: Recent Labs  Lab 01/14/22 0651  LATICACIDVEN 0.8    Recent Results (from the past 240 hour(s))  Resp Panel by RT-PCR (Flu A&B, Covid) Anterior Nasal Swab     Status: None   Collection Time: 01/13/22  8:15 PM   Specimen: Anterior Nasal Swab  Result Value Ref Range Status   SARS Coronavirus 2 by RT PCR NEGATIVE NEGATIVE Final    Comment: (NOTE) SARS-CoV-2 target nucleic acids are NOT DETECTED.  The SARS-CoV-2 RNA is generally detectable in upper respiratory specimens during the acute phase of infection. The lowest concentration of SARS-CoV-2 viral copies this assay can detect is 138 copies/mL. A negative result does not preclude SARS-Cov-2 infection and should not be used as the sole basis for treatment or other patient management decisions. A negative result may occur with  improper specimen collection/handling, submission of specimen other than nasopharyngeal swab, presence of viral mutation(s) within the areas targeted by this assay, and inadequate number of viral copies(<138 copies/mL). A negative result must be combined with clinical  observations, patient history, and epidemiological information. The expected result is Negative.  Fact Sheet for Patients:  EntrepreneurPulse.com.au  Fact Sheet for Healthcare Providers:  IncredibleEmployment.be  This test is no t yet approved or cleared by the [place] FDA and  has been authorized for detection and/or diagnosis of SARS-CoV-2 by FDA under an Emergency Use Authorization (EUA). This EUA will remain  in effect (meaning this test can be used) for the duration of the COVID-19 declaration under Section 564(b)(1) of the Act, 21 U.S.C.section 360bbb-3(b)(1), unless the authorization is terminated  or revoked sooner.       Influenza A by PCR NEGATIVE NEGATIVE Final   Influenza B by PCR NEGATIVE NEGATIVE Final    Comment: (NOTE) The Xpert Xpress SARS-CoV-2/FLU/RSV plus assay is intended as an aid in the diagnosis of influenza from Nasopharyngeal swab specimens and should not be used as a sole basis for treatment. Nasal washings and aspirates are unacceptable for Xpert Xpress SARS-CoV-2/FLU/RSV testing.  Fact Sheet for Patients: EntrepreneurPulse.com.au  Fact Sheet for Healthcare Providers: IncredibleEmployment.be  This test is not yet approved or cleared by the [place] FDA and has been authorized for detection and/or diagnosis of SARS-CoV-2 by FDA under an Emergency Use Authorization (EUA). This EUA will remain in  effect (meaning this test can be used) for the duration of the COVID-19 declaration under Section 564(b)(1) of the Act, 21 U.S.C. section 360bbb-3(b)(1), unless the authorization is terminated or revoked.  Performed at [facility], [address] [place], [place] [address]   Blood culture (routine x 2)     Status: None (Preliminary result)   Collection Time: 01/14/22  6:51 AM   Specimen: BLOOD  Result Value Ref Range Status   Specimen Description BLOOD RIGHT  ANTECUBITAL  Final   Special Requests   Final    BOTTLES DRAWN AEROBIC AND ANAEROBIC Blood Culture adequate volume   Culture   Final    NO GROWTH < 24 HOURS Performed at [facility], [address], [place], [place] [address]    Report Status PENDING  Incomplete    Radiology Studies: CT Abdomen Pelvis W Contrast  Result Date: 01/13/2022 CLINICAL DATA:  Nausea and vomiting for 2 days EXAM: CT ABDOMEN AND PELVIS WITH CONTRAST TECHNIQUE: Multidetector CT imaging of the abdomen and pelvis was performed using the standard protocol following bolus administration of intravenous contrast. RADIATION DOSE REDUCTION: This exam was performed according to the departmental dose-optimization program which includes automated exposure control, adjustment of the mA and/or kV according to patient size and/or use of iterative reconstruction technique. CONTRAST:  63m OMNIPAQUE IOHEXOL 300 MG/ML  SOLN COMPARISON:  CT abdomen and pelvis 12/29/2014 FINDINGS: Lower chest: Mitral annular calcification. Normal heart size. No pericardial effusion. Hepatobiliary: No suspicious hepatic lesion. Cholelithiasis. No evidence of cholecystitis. No biliary dilation. Pancreas: Unremarkable. Spleen: Unremarkable. Adrenals/Urinary Tract: Unremarkable adrenal glands. Low-attenuation lesions in the kidneys are statistically likely to represent cysts. No follow-up is required. No urinary calculi or hydronephrosis. Unremarkable bladder. Stomach/Bowel: Extensive sigmoid diverticulosis with adjacent wall thickening and inflammatory fat stranding. No drainable fluid collection or abscess. Normal caliber large and small bowel. Stomach is unremarkable. Vascular/Lymphatic: Aortic atherosclerosis. No enlarged abdominal or pelvic lymph nodes. Reproductive: Hysterectomy. Other: No free intraperitoneal air. Musculoskeletal: Thoracolumbar curve. Thoracolumbar spondylosis. No acute osseous abnormality. IMPRESSION: Acute uncomplicated sigmoid  diverticulitis. Electronically Signed   By: TPlacido SouM.D.   On: 01/13/2022 22:30    Scheduled Meds:  amLODipine  7.5 mg Oral QPM   atorvastatin  10 mg Oral Daily   clopidogrel  75 mg Oral Daily   heparin  5,000 Units Subcutaneous Q12H   metoprolol succinate  25 mg Oral QPM   pantoprazole (PROTONIX) IV  40 mg Intravenous Q12H   sodium chloride flush  3 mL Intravenous Q12H   Continuous Infusions:  ciprofloxacin 400 mg (01/15/22 1041)   metronidazole 500 mg (01/15/22 0625)     LOS: 1 day    Time spent: 35 mins    Keisa Blow, MD [facility]   If 7PM-7AM, please contact night-coverage

## 2022-01-15 NOTE — Evaluation (Signed)
Occupational Therapy Evaluation Patient Details Name: Claire Washington MRN: [id number] DOB: 1924-07-15 Today's Date: 01/15/2022   History of Present Illness Pt is a 88 y/o F admitted on 01/13/22 after presenting to the ED with c/o N&V & diarrhea, as well as abdominal pain, x 4 days. Work-up reveals sigmoid diverticulitis. PMH arthritis, basal cell carcinoma, DM2, embolic stroke, HLD, essential HTN, SDH   Clinical Impression   Patient presenting with decreased endurance, strength, and balance impacting safety and independence in ADLs. Prior to admission pt reports she was Mod I for ADLs, receives assistance for IADLs (transportation, cleaning, meal delivery), and Mod I for functional mobility with use of rollator. Pt coming from an ILF. Patient currently functioning at Mod A for bed mobility, Mod A for simulated toilet transfer using RW, and Max A for LB dressing. Pt experiencing posterior lean in standing requiring assistance to correct and then requesting a seated rest break 2/2 weakness. VSS on 2L O2 via [place] throughout session. Patient will benefit from acute OT to increase overall independence in the areas of ADLs, functional mobility in order to safely discharge to next venue of care. Upon hospital discharge, recommend STR to maximize pt safety and return to PLOF.      Recommendations for follow up therapy are one component of a multi-disciplinary discharge planning process, led by the attending physician.  Recommendations may be updated based on patient status, additional functional criteria and insurance authorization.   Follow Up Recommendations  Skilled nursing-short term rehab (<3 hours/day)    Assistance Recommended at Discharge Frequent or constant Supervision/Assistance  Patient can return home with the following A lot of help with bathing/dressing/bathroom;A lot of help with walking and/or transfers;Assist for transportation;Assistance with cooking/housework;Help with stairs or ramp  for entrance    Functional Status Assessment  Patient has had a recent decline in their functional status and demonstrates the ability to make significant improvements in function in a reasonable and predictable amount of time.  Equipment Recommendations  Other (comment) (defer to next venue of care)    Recommendations for Other Services       Precautions / Restrictions Precautions Precautions: Fall Restrictions Weight Bearing Restrictions: No      Mobility Bed Mobility   Bed Mobility: Rolling, Sidelying to Sit Rolling: Mod assist Sidelying to sit: Mod assist       General bed mobility comments: cuing for log rolling, to reach for bed rail with UE, assistance to roll & upright trunk during sidelying>sitting, cuing to push with 1 hand & other elbow to attempt to upright her trunk    Transfers Overall transfer level: Needs assistance Equipment used: Rolling walker (2 wheels) Transfers: Sit to/from Stand Sit to Stand: Mod assist           General transfer comment: Once sitting EOB, pt able to scoot hips forward with VC. 1st trial STS pt unable to come to fully upright standing position. 2nd trial STS with Mod A, upon standing pt with posterior lean and significant shaking/jerking requiring seated rest break. Pt deferred trying to stand again 2/2 weakness. Required Mod A to scoot hips laterally toward HOB.      Balance Overall balance assessment: Needs assistance Sitting-balance support: Feet supported, Bilateral upper extremity supported Sitting balance-Leahy Scale: Fair Sitting balance - Comments: close supervision static sitting Postural control: Posterior lean Standing balance support: During functional activity, Reliant on assistive device for balance, Bilateral upper extremity supported Standing balance-Leahy Scale: Poor  ADL either performed or assessed with clinical judgement   ADL Overall ADL's : Needs  assistance/impaired     Grooming: Wash/dry face;Set up;Sitting               Lower Body Dressing: Maximal assistance;Sitting/lateral leans Lower Body Dressing Details (indicate cue type and reason): for socks; normally has dizziness during LB dressing when reaching down towards feet Toilet Transfer: Rolling walker (2 wheels);Moderate assistance;Cueing for sequencing;Cueing for safety Toilet Transfer Details (indicate cue type and reason): simulated with STS from [place] and Hygiene: Maximal assistance               Vision Baseline Vision/History: 1 Wears glasses Patient Visual Report: No change from baseline       Perception     Praxis      Pertinent Vitals/Pain Pain Assessment Pain Assessment: Faces Faces Pain Scale: Hurts little more Pain Location: BLE thighs, neck, back Pain Descriptors / Indicators: Aching, Discomfort Pain Intervention(s): Monitored during session, Repositioned, Limited activity within patient's tolerance     Hand Dominance     Extremity/Trunk Assessment Upper Extremity Assessment Upper Extremity Assessment: Generalized weakness   Lower Extremity Assessment Lower Extremity Assessment: Generalized weakness       Communication Communication Communication: HOH   Cognition Arousal/Alertness: Awake/alert Behavior During Therapy: WFL for tasks assessed/performed, Anxious Overall Cognitive Status: Within Functional Limits for tasks assessed                                 General Comments: Pt anxious with OOB mobility, unsure if coming from a fear of falling     General Comments  VSS on 2L O2 via [place]    Exercises Other Exercises Other Exercises: OT provided education re: role of OT, OT POC, post acute recs, sitting up for all meals, EOB/OOB mobility with assistance, home/fall safety, log roll technique    Shoulder Instructions      Home Living Family/patient expects to be discharged to::  Private residence (ILF at within [employer]) Living Arrangements: Alone   Type of Home: Apartment Home Access: Level entry     Home Layout: One level     Bathroom Shower/Tub: Chief Strategy Officer: Rollator (4 wheels);Rolling Walker (2 wheels);Shower seat;Grab bars - toilet;Grab bars - tub/shower;BSC/3in1          Prior Functioning/Environment Prior Level of Function : Independent/Modified Independent             Mobility Comments: Pt reports she's mod I with rollator & hasn't fallen in 8 months. No longer drives. Pt reports hx of chronic dizziness upon STS at baseline 2/2 hx of strokes. ADLs Comments: Cooks for herself, has someone to assist with cleaning, [employer] for grocery delivery        OT Problem List: Decreased strength;Decreased activity tolerance;Impaired balance (sitting and/or standing);Pain;Decreased safety awareness;Decreased knowledge of use of DME or AE;Cardiopulmonary status limiting activity      OT Treatment/Interventions: Self-care/ADL training;Patient/family education;Balance training;Energy conservation;Therapeutic activities;DME and/or AE instruction;Cognitive remediation/compensation    OT Goals(Current goals can be found in the care plan section) Acute Rehab OT Goals Patient Stated Goal: go home OT Goal Formulation: With patient Time For Goal Achievement: 01/29/22 Potential to Achieve Goals: Good ADL Goals Pt Will Perform Grooming: with supervision;standing Pt Will Perform Upper Body Dressing: with supervision;sitting Pt Will Perform Lower Body Dressing: with adaptive equipment;sitting/lateral  leans;sit to/from stand;with min assist Pt Will Transfer to Toilet: with min guard assist;stand pivot transfer;bedside commode Pt Will Perform Toileting - Clothing Manipulation and hygiene: with min assist;with adaptive equipment;sit to/from stand  OT Frequency: Min 2X/week    Co-evaluation              AM-PAC OT  "6 Clicks" Daily Activity     Outcome Measure Help from another person eating meals?: None Help from another person taking care of personal grooming?: A Little Help from another person toileting, which includes using toliet, bedpan, or urinal?: A Lot Help from another person bathing (including washing, rinsing, drying)?: A Lot Help from another person to put on and taking off regular upper body clothing?: A Little Help from another person to put on and taking off regular lower body clothing?: A Lot 6 Click Score: 16   End of Session Equipment Utilized During Treatment: Gait belt;Rolling walker (2 wheels);Oxygen (2L O2 via [place]) Nurse Communication: Mobility status  Activity Tolerance: Patient limited by fatigue;Patient limited by pain Patient left: in bed;with call bell/phone within reach;with bed alarm set  OT Visit Diagnosis: Other abnormalities of gait and mobility (R26.89);Muscle weakness (generalized) (M62.81);Pain;Dizziness and giddiness (R42)                Time: 1700-1749 OT Time Calculation (min): 19 min Charges:  OT General Charges $OT Visit: 1 Visit OT Evaluation $OT Eval Low Complexity: [place] MS, OTR/L ascom [phone]  01/15/22, 6:43 PM

## 2022-01-15 NOTE — TOC Initial Note (Signed)
Transition of Care [facility]) - Initial/Assessment Note    Patient Details  Name: Claire Washington MRN: [id number] Date of Birth: [birth year]  Transition of Care [facility]) CM/SW Contact:    Elliot Gurney [place], [place] Phone Number: 01/15/2022, 2:53 PM  Clinical Narrative:                 Met with patient at bedside and daughter by phone. Therapy recommendations discussed. Patient and daughter declined SNF at this time. Per patient's daughter, patient has a wheelchair, adjustable bed, raised toilet seat, bedside commode, walker and a lift chair. Patient's daughter will be available to assist with her care needs post discharge.Once she returns to work,  patient's son will be available to assist. They also have a CNA that they can hire.  Patient's PCP is Dr. Delton Prairie. Patient and daughter are agreeable to [facility] services and feel that with this combination of support patient's care needs would be met.  Transition of Care to continue to follow to assist with patient's discharge needs   [facility] [employer] Transition of Care [phone]   Expected Discharge Plan: [place] Barriers to Discharge: Continued Medical Work up   Patient Goals and CMS Choice Patient states their goals for this hospitalization and ongoing recovery are:: To return home      Expected Discharge Plan and Services Expected Discharge Plan: [place] In-house Referral: Clinical Social Work   Post Acute Care Choice: Home Health, Resumption of Svcs/PTA Provider Living arrangements for the past 2 months: [place]                                      Prior Living Arrangements/Services Living arrangements for the past 2 months: Single Family Home Lives with:: Self   Do you feel safe going back to the place where you live?: Yes      Need for Family Participation in Patient Care: Yes (Comment) Care giver support system in place?: Yes (comment)   Criminal Activity/Legal  Involvement Pertinent to Current Situation/Hospitalization: No - Comment as needed  Activities of Daily Living Home Assistive Devices/Equipment: CBG Meter, Other (Comment) (lift chair) ADL Screening (condition at time of admission) Patient's cognitive ability adequate to safely complete daily activities?: Yes Is the patient deaf or have difficulty hearing?: No Does the patient have difficulty seeing, even when wearing glasses/contacts?: No Does the patient have difficulty concentrating, remembering, or making decisions?: No Patient able to express need for assistance with ADLs?: Yes Does the patient have difficulty dressing or bathing?: No Independently performs ADLs?: Yes (appropriate for developmental age) Does the patient have difficulty walking or climbing stairs?: No Weakness of Legs: Both Weakness of Arms/Hands: None  Permission Sought/Granted      Share Information with NAME: Dorian Pod     Permission granted to share info w Relationship: daughter  Permission granted to share info w Contact Information: [phone]  Emotional Assessment Appearance:: Appears older than stated age Attitude/Demeanor/Rapport: Engaged Affect (typically observed): Accepting, Adaptable Orientation: : Oriented to Self, Oriented to Place, Oriented to  Time Alcohol / Substance Use: Not Applicable Psych Involvement: No (comment)  Admission diagnosis:  Sigmoid diverticulitis [K57.32] Nausea vomiting and diarrhea [R11.2, R19.7] Patient Active Problem List   Diagnosis Date Noted   Nausea vomiting and diarrhea 01/14/2022   Sigmoid diverticulitis 01/13/2022   Hyponatremia 08/31/2020   Lymphedema 03/09/2019   Swelling of limb  02/28/2019   Cellulitis of left leg 12/31/2018   Hip pain, bilateral 12/31/2018   Cellulitis 12/27/2018   Carotid stenosis 12/28/2017   Vertigo 11/30/2017   Imbalance due to old stroke 11/03/2016   Gait instability 10/19/2016   Ataxia 08/30/2016   History of stroke 08/30/2016    Dizziness 11/03/2015   Abnormal PET scan of colon 06/18/2014   H/O seasonal allergies 06/04/2014   Lung nodule, solitary 04/09/17   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

## 2022-01-15 NOTE — Progress Notes (Signed)
Pt noted to be wheezing. PRN Ned Tx given

## 2022-01-15 NOTE — Progress Notes (Signed)
Pt wheezing this am. She also complains of lower abdominal pain. MD Dwyane Dee made aware.  Bladder scan showed 49ms.

## 2022-01-16 DIAGNOSIS — R112 Nausea with vomiting, unspecified: Secondary | ICD-10-CM | POA: Diagnosis not present

## 2022-01-16 DIAGNOSIS — R197 Diarrhea, unspecified: Secondary | ICD-10-CM | POA: Diagnosis not present

## 2022-01-16 LAB — CBC
HCT: 35.2 % — ABNORMAL LOW (ref 36.0–46.0)
Hemoglobin: 11.6 g/dL — ABNORMAL LOW (ref 12.0–15.0)
MCH: 28.4 pg (ref 26.0–34.0)
MCHC: 33 g/dL (ref 30.0–36.0)
MCV: 86.1 fL (ref 80.0–100.0)
Platelets: 189 10*3/uL (ref 150–400)
RBC: 4.09 MIL/uL (ref 3.87–5.11)
RDW: 13.6 % (ref 11.5–15.5)
WBC: 6.8 10*3/uL (ref 4.0–10.5)
nRBC: 0 % (ref 0.0–0.2)

## 2022-01-16 LAB — BASIC METABOLIC PANEL
Anion gap: 5 (ref 5–15)
BUN: 8 mg/dL (ref 8–23)
CO2: 23 mmol/L (ref 22–32)
Calcium: 7.8 mg/dL — ABNORMAL LOW (ref 8.9–10.3)
Chloride: 108 mmol/L (ref 98–111)
Creatinine, Ser: 0.69 mg/dL (ref 0.44–1.00)
GFR, Estimated: >60 mL/min (ref >60)
Glucose, Bld: 96 mg/dL (ref 70–99)
Potassium: 3.4 mmol/L — ABNORMAL LOW (ref 3.5–5.1)
Sodium: 136 mmol/L (ref 135–145)

## 2022-01-16 LAB — MAGNESIUM: Magnesium: 1.9 mg/dL (ref 1.7–2.4)

## 2022-01-16 MED ORDER — POTASSIUM CHLORIDE 20 MEQ PO PACK
40.0000 meq | PACK | Freq: Once | ORAL | Status: AC
  Administered 2022-01-16: 40 meq via ORAL
  Filled 2022-01-16: qty 2

## 2022-01-16 NOTE — Progress Notes (Signed)
Patient said her doctor took her off of atorvastatin. Dr Dwyane Dee notified. Order given to d/c atorvastatin, IVF, and telemetry

## 2022-01-16 NOTE — TOC Progression Note (Signed)
Transition of Care [facility]) - Progression Note    Patient Details  Name: Claire Washington MRN: [id number] Date of Birth: Oct 14, 1924  Transition of Care [facility]) CM/SW Contact  Beverly Sessions, RN Phone Number: 01/16/2022, 9:59 AM  Clinical Narrative:      Followed up with daughter Dorian Pod and she confirms they are agreeable to home with with a preference of [place] Referral made and accepted by [name] with [facility]  Expected Discharge Plan: [place] Barriers to Discharge: Continued Medical Work up  Expected Discharge Plan and Services Expected Discharge Plan: [place] In-house Referral: Clinical Social Work   Post Acute Care Choice: Home Health, Resumption of Svcs/PTA Provider Living arrangements for the past 2 months: Single Family Home                                       Social Determinants of Health (SDOH) Interventions    Readmission Risk Interventions     No data to display

## 2022-01-16 NOTE — Progress Notes (Signed)
PROGRESS NOTE    Yaneliz Radebaugh Fluegge  ZYS:[id number] DOB: Dec 18, 1924 DOA: 01/13/2022 PCP: Tracie Harrier, MD    Brief Narrative:  This 88 years old female with PMH significant for arthritis, basal cell carcinoma, type 2 diabetes, history of embolic stroke, hyperlipidemia, essential hypertension, history of subdural hematoma, presented in the ED with complaints of nausea, vomiting, diarrhea and abdominal pain for 4 days.  Work-up in the ED reveals sigmoid diverticulitis.  Patient is started on IV antibiotics and admitted for further management.  Assessment & Plan:   Principal Problem:   Nausea vomiting and diarrhea Active Problems:   Hypertension   Diabetes mellitus ([place])   Sigmoid diverticulitis  Acute sigmoid diverticulitis: Patient presented with abdominal pain, nausea, vomiting and diarrhea for 4 days. CT abdomen and pelvis consistent with acute sigmoid diverticulitis. Patient started on empiric antibiotics ( ciprofloxacin and Flagyl) Continue IV fluids.  Continue Zofran as needed for nausea and vomiting. Lactic acid 0.8, No signs of sepsis.  Diabetes mellitus type 2: Diet controlled.  Carb modified diet.  Essential hypertension: Continue amlodipine and metoprolol.  Hyperlipidemia: Continue atorvastatin.  GERD: Continue pantoprazole 40 mg daily.  Generalized weakness: PT and OT recommended SNF.  Wheezing: She denies any history of asthma.  Found to have wheezing. Started on DuoNeb every 6 as needed.  DVT prophylaxis: Heparin Code Status: Full code Family Communication: No family at bed side. Disposition Plan:    Status is: Inpatient Remains inpatient appropriate because: Admitted for acute sigmoid diverticulitis requiring IV antibiotics.  Anticipated discharge home with home health services 01/17/2022.  Consultants:  None  Procedures: CT abdomen and pelvis Antimicrobials: Ciprofloxacin and Flagyl  Subjective: Patient was seen and examined at  bedside.  Overnight events noted.   Patient reports having pain in the pelvic area, she denies any burning urination. Patient is tolerating soft bland diet, states feeling weak and not able to go home today  Objective: Vitals:   01/16/22 0427 01/16/22 0454 01/16/22 0816 01/16/22 1017  BP: (!) 124/48  (!) 135/45   Pulse: 79  84   Resp: 16  18   Temp: (!) 97.5 F (36.4 C)  98.2 F (36.8 C)   TempSrc: Oral  Oral   SpO2: 93%  91% 95%  Weight:  59 kg    Height:        Intake/Output Summary (Last 24 hours) at 01/16/2022 1159 Last data filed at 01/16/2022 1056 Gross per 24 hour  Intake 2237.91 ml  Output 1050 ml  Net 1187.91 ml   Filed Weights   01/14/22 0325 01/15/22 0500 01/16/22 0454  Weight: 53.2 kg 54.5 kg 59 kg    Examination:  General exam: Appears comfortable, not in any distress, deconditioned. Respiratory system: CTA bilaterally, respiratory effort normal, RR 15 Cardiovascular system: S1-S2 heard, regular rate and rhythm, no murmur. Gastrointestinal system: Abdomen is soft, non tender, non distended, BS+ Central nervous system: Alert and oriented x3, no focal neurological deficits. Extremities: No edema, no cyanosis, no clubbing. Skin: No rashes, lesions or ulcers Psychiatry: Judgement and insight appear normal. Mood & affect appropriate.     Data Reviewed: I have personally reviewed following labs and imaging studies  CBC: Recent Labs  Lab 01/13/22 1714 01/14/22 0651 01/15/22 0603 01/16/22 0621  WBC 15.1* 12.2* 8.3 6.8  HGB 13.8 13.0 11.8* 11.6*  HCT 42.1 39.7 36.5 35.2*  MCV 85.4 86.1 86.9 86.1  PLT 202 164 162 932   Basic Metabolic Panel: Recent Labs  Lab 01/13/22 1714 01/14/22  1962 01/16/22 0621  NA 134* 138 136  K 3.7 3.6 3.4*  CL 103 110 108  CO2 '22 24 23  '$ GLUCOSE 178* 123* 96  BUN '18 11 8  '$ CREATININE 0.63 0.59 0.69  CALCIUM 8.5* 7.9* 7.8*  MG  --   --  1.9   GFR: Estimated Creatinine Clearance: 33.8 mL/min (by C-G formula based on  SCr of 0.69 mg/dL). Liver Function Tests: Recent Labs  Lab 01/13/22 1714 01/14/22 0651  AST 12* 10*  ALT 11 11  ALKPHOS 54 52  BILITOT 1.4* 1.1  PROT 6.5 5.6*  ALBUMIN 3.5 2.9*   Recent Labs  Lab 01/13/22 1714  LIPASE 25   No results for input(s): "AMMONIA" in the last 168 hours. Coagulation Profile: No results for input(s): "INR", "PROTIME" in the last 168 hours. Cardiac Enzymes: No results for input(s): "CKTOTAL", "CKMB", "CKMBINDEX", "TROPONINI" in the last 168 hours. BNP (last 3 results) No results for input(s): "PROBNP" in the last 8760 hours. HbA1C: No results for input(s): "HGBA1C" in the last 72 hours. CBG: No results for input(s): "GLUCAP" in the last 168 hours. Lipid Profile: No results for input(s): "CHOL", "HDL", "LDLCALC", "TRIG", "CHOLHDL", "LDLDIRECT" in the last 72 hours. Thyroid Function Tests: Recent Labs    01/14/22 0651  TSH 1.841  FREET4 0.84   Anemia Panel: No results for input(s): "VITAMINB12", "FOLATE", "FERRITIN", "TIBC", "IRON", "RETICCTPCT" in the last 72 hours. Sepsis Labs: Recent Labs  Lab 01/14/22 0651  LATICACIDVEN 0.8    Recent Results (from the past 240 hour(s))  Resp Panel by RT-PCR (Flu A&B, Covid) Anterior Nasal Swab     Status: None   Collection Time: 01/13/22  8:15 PM   Specimen: Anterior Nasal Swab  Result Value Ref Range Status   SARS Coronavirus 2 by RT PCR NEGATIVE NEGATIVE Final    Comment: (NOTE) SARS-CoV-2 target nucleic acids are NOT DETECTED.  The SARS-CoV-2 RNA is generally detectable in upper respiratory specimens during the acute phase of infection. The lowest concentration of SARS-CoV-2 viral copies this assay can detect is 138 copies/mL. A negative result does not preclude SARS-Cov-2 infection and should not be used as the sole basis for treatment or other patient management decisions. A negative result may occur with  improper specimen collection/handling, submission of specimen other than  nasopharyngeal swab, presence of viral mutation(s) within the areas targeted by this assay, and inadequate number of viral copies(<138 copies/mL). A negative result must be combined with clinical observations, patient history, and epidemiological information. The expected result is Negative.  Fact Sheet for Patients:  EntrepreneurPulse.com.au  Fact Sheet for Healthcare Providers:  IncredibleEmployment.be  This test is no t yet approved or cleared by the [place] FDA and  has been authorized for detection and/or diagnosis of SARS-CoV-2 by FDA under an Emergency Use Authorization (EUA). This EUA will remain  in effect (meaning this test can be used) for the duration of the COVID-19 declaration under Section 564(b)(1) of the Act, 21 U.S.C.section 360bbb-3(b)(1), unless the authorization is terminated  or revoked sooner.       Influenza A by PCR NEGATIVE NEGATIVE Final   Influenza B by PCR NEGATIVE NEGATIVE Final    Comment: (NOTE) The Xpert Xpress SARS-CoV-2/FLU/RSV plus assay is intended as an aid in the diagnosis of influenza from Nasopharyngeal swab specimens and should not be used as a sole basis for treatment. Nasal washings and aspirates are unacceptable for Xpert Xpress SARS-CoV-2/FLU/RSV testing.  Fact Sheet for Patients: EntrepreneurPulse.com.au  Fact Sheet  for Healthcare Providers: IncredibleEmployment.be  This test is not yet approved or cleared by the [place] and has been authorized for detection and/or diagnosis of SARS-CoV-2 by FDA under an Emergency Use Authorization (EUA). This EUA will remain in effect (meaning this test can be used) for the duration of the COVID-19 declaration under Section 564(b)(1) of the Act, 21 U.S.C. section 360bbb-3(b)(1), unless the authorization is terminated or revoked.  Performed at [facility], [address] [place], [place]  [address]   Blood culture (routine x 2)     Status: None (Preliminary result)   Collection Time: 01/14/22  6:51 AM   Specimen: BLOOD  Result Value Ref Range Status   Specimen Description BLOOD RIGHT ANTECUBITAL  Final   Special Requests   Final    BOTTLES DRAWN AEROBIC AND ANAEROBIC Blood Culture adequate volume   Culture   Final    NO GROWTH < 24 HOURS Performed at [facility], [address] [place], [place] [address]    Report Status PENDING  Incomplete    Radiology Studies: No results found.  Scheduled Meds:  amLODipine  7.5 mg Oral QPM   clopidogrel  75 mg Oral Daily   heparin  5,000 Units Subcutaneous Q12H   pantoprazole (PROTONIX) IV  40 mg Intravenous Q12H   senna  1 tablet Oral Daily   sodium chloride flush  3 mL Intravenous Q12H   Continuous Infusions:  ciprofloxacin 200 mL/hr at 01/16/22 1056   metronidazole Stopped (01/16/22 0707)     LOS: 2 days    Time spent: 35 mins    Gurtaj Ruz, MD [facility]   If 7PM-7AM, please contact night-coverage

## 2022-01-16 NOTE — Progress Notes (Signed)
Occupational Therapy Treatment Patient Details Name: Claire Washington MRN: [id number] DOB: Mar 02, 1925 Today's Date: 01/16/2022   History of present illness Pt is a 88 y/o F admitted on 01/13/22 after presenting to the ED with c/o N&V & diarrhea, as well as abdominal pain, x 4 days. Work-up reveals sigmoid diverticulitis. PMH arthritis, basal cell carcinoma, DM2, embolic stroke, HLD, essential HTN, SDH   OT comments  Upon entering session, pt resting in bed and agreeable to OT. RN entering room to give pain meds and assist with mobility. Tx session targeted improving functional balance during ADL tasks in preparation for improved functional transfers and ADL task completion. Pt required Max A for bed mobility using log roll technique, Min guard for static sitting balance, Min A +2 to stand from EOB, and Min guard +2 to take steps toward recliner using RW. Pt continues to experience shaking/jerking upon standing (pt reports this is baseline), however, improves with time. Pt left sitting in recliner with all needs in reach. Pt is making progress toward goal completion. D/C recommendation remains appropriate. OT will continue to follow acutely.    Recommendations for follow up therapy are one component of a multi-disciplinary discharge planning process, led by the attending physician.  Recommendations may be updated based on patient status, additional functional criteria and insurance authorization.    Follow Up Recommendations  Skilled nursing-short term rehab (<3 hours/day)    Assistance Recommended at Discharge Frequent or constant Supervision/Assistance  Patient can return home with the following  A lot of help with bathing/dressing/bathroom;A lot of help with walking and/or transfers;Assist for transportation;Assistance with cooking/housework;Help with stairs or ramp for entrance   Equipment Recommendations  Other (comment) (defer to next venue of care)    Recommendations for Other Services       Precautions / Restrictions Precautions Precautions: Fall Restrictions Weight Bearing Restrictions: No       Mobility Bed Mobility Overal bed mobility: Needs Assistance Bed Mobility: Rolling, Sidelying to Sit Rolling: Max assist Sidelying to sit: Max assist            Transfers Overall transfer level: Needs assistance Equipment used: Rolling walker (2 wheels) Transfers: Sit to/from Stand, Bed to chair/wheelchair/BSC Sit to Stand: Min assist, +2 physical assistance     Step pivot transfers: Min guard, +2 physical assistance     General transfer comment: Min A to scoot hips forward at EOB, VC for hand placement, posterior lean in standing with jerking/shaking (pt reports baseline) - assist to keep RW on the ground, improved with time     Balance Overall balance assessment: Needs assistance Sitting-balance support: Feet supported, Bilateral upper extremity supported Sitting balance-Leahy Scale: Fair Sitting balance - Comments: Min guard static sitting balance   Postural control: Posterior lean Standing balance support: Bilateral upper extremity supported, Reliant on assistive device for balance Standing balance-Leahy Scale: Poor                             ADL either performed or assessed with clinical judgement   ADL Overall ADL's : Needs assistance/impaired                     Lower Body Dressing: Maximal assistance;Sitting/lateral leans               Functional mobility during ADLs: Min guard;+2 for physical assistance;Rolling walker (2 wheels) (to take ~4 steps toward recliner, Min A for RW management) General ADL Comments:  Pt deferred further grooming tasks 2/2 fatigue and pain    Extremity/Trunk Assessment Upper Extremity Assessment Upper Extremity Assessment: Generalized weakness   Lower Extremity Assessment Lower Extremity Assessment: Generalized weakness        Vision Baseline Vision/History: 1 Wears glasses Patient  Visual Report: No change from baseline     Perception     Praxis      Cognition Arousal/Alertness: Awake/alert Behavior During Therapy: WFL for tasks assessed/performed, Anxious Overall Cognitive Status: Within Functional Limits for tasks assessed                                          Exercises      Shoulder Instructions       General Comments Pt with wheezing in supine and after activity - RN contacted respiratory. VC for pursed lip breathing    Pertinent Vitals/ Pain       Pain Assessment Pain Assessment: Faces Faces Pain Scale: Hurts little more Pain Location: BLE thighs, neck, back Pain Descriptors / Indicators: Aching, Discomfort Pain Intervention(s): Limited activity within patient's tolerance, Repositioned (RN gave pain meds during session)  Home Living                                          Prior Functioning/Environment              Frequency  Min 2X/week        Progress Toward Goals  OT Goals(current goals can now be found in the care plan section)  Progress towards OT goals: Progressing toward goals  Acute Rehab OT Goals Patient Stated Goal: go home OT Goal Formulation: With patient Time For Goal Achievement: 01/29/22 Potential to Achieve Goals: Good  Plan Discharge plan remains appropriate;Frequency remains appropriate    Co-evaluation                 AM-PAC OT "6 Clicks" Daily Activity     Outcome Measure   Help from another person eating meals?: None Help from another person taking care of personal grooming?: A Little Help from another person toileting, which includes using toliet, bedpan, or urinal?: A Lot Help from another person bathing (including washing, rinsing, drying)?: A Lot Help from another person to put on and taking off regular upper body clothing?: A Little Help from another person to put on and taking off regular lower body clothing?: A Lot 6 Click Score: 16    End of  Session Equipment Utilized During Treatment: Rolling walker (2 wheels);Oxygen;Gait belt (2L O2 via )  OT Visit Diagnosis: Other abnormalities of gait and mobility (R26.89);Muscle weakness (generalized) (M62.81);Pain;Dizziness and giddiness (R42)   Activity Tolerance Patient limited by fatigue;Patient limited by pain   Patient Left in chair;with call bell/phone within reach;with chair alarm set   Nurse Communication Mobility status        Time: 0950-1010 OT Time Calculation (min): 20 min  Charges: OT General Charges $OT Visit: 1 Visit OT Treatments $Self Care/Home Management : 8-22 mins  [facility] MS, OTR/L ascom [phone]  01/16/22, 1:49 PM

## 2022-01-16 NOTE — Progress Notes (Signed)
Patient continue to have wheezing during the shift. PRN breathing treatment given and Incentive spirometer activity completed x 3 during the shift. No signs of acute distress.

## 2022-01-16 NOTE — Progress Notes (Signed)
Physical Therapy Treatment Patient Details Name: Claire Washington MRN: [id number] DOB: May 28, 1924 Today's Date: 01/16/2022   History of Present Illness Pt is a 88 y/o F admitted on 01/13/22 after presenting to the ED with c/o N&V & diarrhea, as well as abdominal pain, x 4 days. Work-up reveals sigmoid diverticulitis. PMH arthritis, basal cell carcinoma, DM2, embolic stroke, HLD, essential HTN, SDH    PT Comments    Pt is making gradual progress towards goals with ability to ambulate very short distances (few feet forward/backward) using RW. Post lean and ataxic pattern noted. Pt able to perform seated there-ex and was overall pleased of her progress. Will continue to progress as able. Performed 10 reps of IS reaching 375 mL consistently.   Recommendations for follow up therapy are one component of a multi-disciplinary discharge planning process, led by the attending physician.  Recommendations may be updated based on patient status, additional functional criteria and insurance authorization.  Follow Up Recommendations  Skilled nursing-short term rehab (<3 hours/day) (pt plans to go home with assist from family, recommend HHPT with max services) Can patient physically be transported by private vehicle: No   Assistance Recommended at Discharge Frequent or constant Supervision/Assistance  Patient can return home with the following A lot of help with bathing/dressing/bathroom;A lot of help with walking and/or transfers;Assist for transportation;Assistance with cooking/housework;Help with stairs or ramp for entrance   Equipment Recommendations  None recommended by PT    Recommendations for Other Services       Precautions / Restrictions Precautions Precautions: Fall Restrictions Weight Bearing Restrictions: No     Mobility  Bed Mobility               General bed mobility comments: received in recliner    Transfers Overall transfer level: Needs assistance Equipment used:  Rolling walker (2 wheels) Transfers: Sit to/from Stand Sit to Stand: Mod assist           General transfer comment: needs cues for scoot towards edge of chair and for hand placement. Post lean during standing with jerking. daughter reports this is baseline. once standing, upright posture noted    Ambulation/Gait Ambulation/Gait assistance: Min assist Gait Distance (Feet): 5 Feet Assistive device: Rolling walker (2 wheels) Gait Pattern/deviations: Step-to pattern       General Gait Details: ambulated with step to gait pattern with ability to take a few steps forward/backward x 2 reps. RW used with intermittent jerking and post lean. All mobility performed on O2. Quick fatigue   Stairs             Wheelchair Mobility    Modified Rankin (Stroke Patients Only)       Balance Overall balance assessment: Needs assistance Sitting-balance support: Feet supported, Bilateral upper extremity supported Sitting balance-Leahy Scale: Fair     Standing balance support: Bilateral upper extremity supported Standing balance-Leahy Scale: Poor Standing balance comment: post leaning                            Cognition Arousal/Alertness: Awake/alert Behavior During Therapy: WFL for tasks assessed/performed, Anxious Overall Cognitive Status: Within Functional Limits for tasks assessed                                 General Comments: pleasant and agreeable to session        Exercises Other Exercises Other Exercises: seated ther-ex performed on  B LE including SLRs, hip abd/add, SAQ, hip add squeezes, quad sets, and AP. 10 reps with cues for sequencing.    General Comments        Pertinent Vitals/Pain Pain Assessment Pain Assessment: Faces Faces Pain Scale: Hurts little more Pain Location: BLE thighs, neck, back Pain Descriptors / Indicators: Aching, Discomfort Pain Intervention(s): Limited activity within patient's tolerance, Repositioned     Home Living                          Prior Function            PT Goals (current goals can now be found in the care plan section) Acute Rehab PT Goals Patient Stated Goal: get better PT Goal Formulation: With patient Time For Goal Achievement: 01/29/22 Potential to Achieve Goals: Good Progress towards PT goals: Progressing toward goals    Frequency    Min 2X/week      PT Plan Current plan remains appropriate    Co-evaluation              AM-PAC PT "6 Clicks" Mobility   Outcome Measure  Help needed turning from your back to your side while in a flat bed without using bedrails?: A Lot Help needed moving from lying on your back to sitting on the side of a flat bed without using bedrails?: A Lot Help needed moving to and from a bed to a chair (including a wheelchair)?: A Little Help needed standing up from a chair using your arms (e.g., wheelchair or bedside chair)?: A Lot Help needed to walk in hospital room?: A Lot Help needed climbing 3-5 steps with a railing? : Total 6 Click Score: 12    End of Session Equipment Utilized During Treatment: Gait belt;Oxygen Activity Tolerance: Patient limited by fatigue Patient left: in chair;with chair alarm set;with family/visitor present Nurse Communication: Mobility status PT Visit Diagnosis: Unsteadiness on feet (R26.81);Difficulty in walking, not elsewhere classified (R26.2);Muscle weakness (generalized) (M62.81);Other abnormalities of gait and mobility (R26.89)     Time: 6546-5035 PT Time Calculation (min) (ACUTE ONLY): 20 min  Charges:  $Therapeutic Exercise: 8-22 mins                     Greggory Stallion, PT, DPT, GCS [phone]    Claire Washington 01/16/2022, 1:37 PM

## 2022-01-17 DIAGNOSIS — R112 Nausea with vomiting, unspecified: Secondary | ICD-10-CM | POA: Diagnosis not present

## 2022-01-17 DIAGNOSIS — R197 Diarrhea, unspecified: Secondary | ICD-10-CM | POA: Diagnosis not present

## 2022-01-17 LAB — BASIC METABOLIC PANEL
Anion gap: 4 — ABNORMAL LOW (ref 5–15)
BUN: 6 mg/dL — ABNORMAL LOW (ref 8–23)
CO2: 24 mmol/L (ref 22–32)
Calcium: 8 mg/dL — ABNORMAL LOW (ref 8.9–10.3)
Chloride: 110 mmol/L (ref 98–111)
Creatinine, Ser: 0.67 mg/dL (ref 0.44–1.00)
GFR, Estimated: >60 mL/min (ref >60)
Glucose, Bld: 115 mg/dL — ABNORMAL HIGH (ref 70–99)
Potassium: 3.9 mmol/L (ref 3.5–5.1)
Sodium: 138 mmol/L (ref 135–145)

## 2022-01-17 MED ORDER — AMOXICILLIN-POT CLAVULANATE 875-125 MG PO TABS
1.0000 | ORAL_TABLET | Freq: Two times a day (BID) | ORAL | Status: DC
  Administered 2022-01-17 – 2022-01-18 (×2): 1 via ORAL
  Filled 2022-01-17 (×2): qty 1

## 2022-01-17 NOTE — Progress Notes (Addendum)
PROGRESS NOTE    Claire Washington  GGY:[id number] DOB: 1925/01/07 DOA: 01/13/2022  PCP: Tracie Harrier, MD    Brief Narrative:  This 88 years old female with PMH significant for arthritis, basal cell carcinoma, type 2 diabetes, history of embolic stroke, hyperlipidemia, essential hypertension, history of subdural hematoma, presented in the ED with complaints of nausea, vomiting, diarrhea and abdominal pain for 4 days.  Work-up in the ED reveals sigmoid diverticulitis.  Patient is started on IV antibiotics and admitted for further management.  Assessment & Plan:   Principal Problem:   Nausea vomiting and diarrhea Active Problems:   Hypertension   Diabetes mellitus ([place])   Sigmoid diverticulitis  Acute sigmoid diverticulitis: Patient presented with abdominal pain, nausea, vomiting and diarrhea for 4 days. CT abdomen and pelvis consistent with acute sigmoid diverticulitis. Patient started on empiric antibiotics ( ciprofloxacin and Flagyl) Continue IV fluids.  Continue Zofran as needed for nausea and vomiting. Lactic acid 0.8, No signs of sepsis. Needs total 10 days of Antibiotics.   Diabetes mellitus type 2: Diet controlled.  Carb modified diet.  Essential hypertension: Continue amlodipine. Hold metoprolol due to wheezing and low BP.  Hyperlipidemia: Continue atorvastatin.  GERD: Continue pantoprazole 40 mg daily.  Generalized weakness: PT and OT recommended SNF. Patient and family refuses to go to SNF, they wants to go home. Home health services been arranged.  There is no family member available today.  Wheezing: She denies any history of asthma.  Found to have wheezing. Started on DuoNeb every 6 as needed. Needs albuterol inhaler at discharge.  DVT prophylaxis: Heparin Code Status: Full code Family Communication: No family at bed side. Disposition Plan:    Status is: Inpatient Remains inpatient appropriate because: Admitted for acute sigmoid  diverticulitis requiring IV antibiotics.  Anticipated discharge home with home health services 01/18/2022.  Consultants:  None  Procedures: CT abdomen and pelvis Antimicrobials: Ciprofloxacin and Flagyl  Subjective: Patient was seen and examined at bedside.  Overnight events noted.   Patient reports pain has improved in the left flank, reports discomfort in the pelvic area. Patient is tolerating soft bland diet, states feeling weak and not able to go home today.  Objective: Vitals:   01/17/22 0255 01/17/22 0301 01/17/22 0742 01/17/22 0755  BP: (!) 128/45  (!) 118/51   Pulse: 86  80   Resp: 16  12   Temp: 98.4 F (36.9 C)  98.1 F (36.7 C)   TempSrc: Oral     SpO2: 93%  (!) 89% 92%  Weight:  59.1 kg    Height:        Intake/Output Summary (Last 24 hours) at 01/17/2022 1101 Last data filed at 01/17/2022 1007 Gross per 24 hour  Intake 183 ml  Output 650 ml  Net -467 ml   Filed Weights   01/15/22 0500 01/16/22 0454 01/17/22 0301  Weight: 54.5 kg 59 kg 59.1 kg    Examination:  General exam: Appears comfortable, not in any acute distress, deconditioned. Respiratory system: CTA bilaterally, respiratory effort normal, RR 15 Cardiovascular system: S1-S2 heard, regular rate and rhythm, no murmur. Gastrointestinal system: Abdomen is soft, non tender, non distended, BS+ Central nervous system: Alert and oriented x3, no focal neurological deficits. Extremities: No edema, no cyanosis, no clubbing. Skin: No rashes, lesions or ulcers Psychiatry: Judgement and insight appear normal. Mood & affect appropriate.     Data Reviewed: I have personally reviewed following labs and imaging studies  CBC: Recent Labs  Lab 01/13/22 1714  01/14/22 0651 01/15/22 0603 01/16/22 0621  WBC 15.1* 12.2* 8.3 6.8  HGB 13.8 13.0 11.8* 11.6*  HCT 42.1 39.7 36.5 35.2*  MCV 85.4 86.1 86.9 86.1  PLT 202 164 162 409   Basic Metabolic Panel: Recent Labs  Lab 01/13/22 1714 01/14/22 0651  01/16/22 0621 01/17/22 0903  NA 134* 138 136 138  K 3.7 3.6 3.4* 3.9  CL 103 110 108 110  CO2 '22 24 23 24  '$ GLUCOSE 178* 123* 96 115*  BUN '18 11 8 '$ 6*  CREATININE 0.63 0.59 0.69 0.67  CALCIUM 8.5* 7.9* 7.8* 8.0*  MG  --   --  1.9  --    GFR: Estimated Creatinine Clearance: 33.8 mL/min (by C-G formula based on SCr of 0.67 mg/dL). Liver Function Tests: Recent Labs  Lab 01/13/22 1714 01/14/22 0651  AST 12* 10*  ALT 11 11  ALKPHOS 54 52  BILITOT 1.4* 1.1  PROT 6.5 5.6*  ALBUMIN 3.5 2.9*   Recent Labs  Lab 01/13/22 1714  LIPASE 25   No results for input(s): "AMMONIA" in the last 168 hours. Coagulation Profile: No results for input(s): "INR", "PROTIME" in the last 168 hours. Cardiac Enzymes: No results for input(s): "CKTOTAL", "CKMB", "CKMBINDEX", "TROPONINI" in the last 168 hours. BNP (last 3 results) No results for input(s): "PROBNP" in the last 8760 hours. HbA1C: No results for input(s): "HGBA1C" in the last 72 hours. CBG: No results for input(s): "GLUCAP" in the last 168 hours. Lipid Profile: No results for input(s): "CHOL", "HDL", "LDLCALC", "TRIG", "CHOLHDL", "LDLDIRECT" in the last 72 hours. Thyroid Function Tests: No results for input(s): "TSH", "T4TOTAL", "FREET4", "T3FREE", "THYROIDAB" in the last 72 hours.  Anemia Panel: No results for input(s): "VITAMINB12", "FOLATE", "FERRITIN", "TIBC", "IRON", "RETICCTPCT" in the last 72 hours. Sepsis Labs: Recent Labs  Lab 01/14/22 0651  LATICACIDVEN 0.8    Recent Results (from the past 240 hour(s))  Resp Panel by RT-PCR (Flu A&B, Covid) Anterior Nasal Swab     Status: None   Collection Time: 01/13/22  8:15 PM   Specimen: Anterior Nasal Swab  Result Value Ref Range Status   SARS Coronavirus 2 by RT PCR NEGATIVE NEGATIVE Final    Comment: (NOTE) SARS-CoV-2 target nucleic acids are NOT DETECTED.  The SARS-CoV-2 RNA is generally detectable in upper respiratory specimens during the acute phase of infection. The  lowest concentration of SARS-CoV-2 viral copies this assay can detect is 138 copies/mL. A negative result does not preclude SARS-Cov-2 infection and should not be used as the sole basis for treatment or other patient management decisions. A negative result may occur with  improper specimen collection/handling, submission of specimen other than nasopharyngeal swab, presence of viral mutation(s) within the areas targeted by this assay, and inadequate number of viral copies(<138 copies/mL). A negative result must be combined with clinical observations, patient history, and epidemiological information. The expected result is Negative.  Fact Sheet for Patients:  EntrepreneurPulse.com.au  Fact Sheet for Healthcare Providers:  IncredibleEmployment.be  This test is no t yet approved or cleared by the [place] FDA and  has been authorized for detection and/or diagnosis of SARS-CoV-2 by FDA under an Emergency Use Authorization (EUA). This EUA will remain  in effect (meaning this test can be used) for the duration of the COVID-19 declaration under Section 564(b)(1) of the Act, 21 U.S.C.section 360bbb-3(b)(1), unless the authorization is terminated  or revoked sooner.       Influenza A by PCR NEGATIVE NEGATIVE Final   Influenza B  by PCR NEGATIVE NEGATIVE Final    Comment: (NOTE) The Xpert Xpress SARS-CoV-2/FLU/RSV plus assay is intended as an aid in the diagnosis of influenza from Nasopharyngeal swab specimens and should not be used as a sole basis for treatment. Nasal washings and aspirates are unacceptable for Xpert Xpress SARS-CoV-2/FLU/RSV testing.  Fact Sheet for Patients: EntrepreneurPulse.com.au  Fact Sheet for Healthcare Providers: IncredibleEmployment.be  This test is not yet approved or cleared by the [place] FDA and has been authorized for detection and/or diagnosis of SARS-CoV-2 by FDA under  an Emergency Use Authorization (EUA). This EUA will remain in effect (meaning this test can be used) for the duration of the COVID-19 declaration under Section 564(b)(1) of the Act, 21 U.S.C. section 360bbb-3(b)(1), unless the authorization is terminated or revoked.  Performed at [facility], [address] [place], [place] [address]   Blood culture (routine x 2)     Status: None (Preliminary result)   Collection Time: 01/14/22  6:51 AM   Specimen: BLOOD  Result Value Ref Range Status   Specimen Description BLOOD RIGHT ANTECUBITAL  Final   Special Requests   Final    BOTTLES DRAWN AEROBIC AND ANAEROBIC Blood Culture adequate volume   Culture   Final    NO GROWTH 3 DAYS Performed at [facility], [address] [place], [place] [address]    Report Status PENDING  Incomplete  Culture, blood (Routine X 2) w Reflex to ID Panel     Status: None (Preliminary result)   Collection Time: 01/15/22  5:29 PM   Specimen: BLOOD RIGHT ARM  Result Value Ref Range Status   Specimen Description BLOOD RIGHT ARM  Final   Special Requests   Final    BOTTLES DRAWN AEROBIC AND ANAEROBIC Blood Culture adequate volume   Culture   Final    NO GROWTH 2 DAYS Performed at [facility], [address], [place], [place] [address]    Report Status PENDING  Incomplete    Radiology Studies: No results found.  Scheduled Meds:  amLODipine  7.5 mg Oral QPM   clopidogrel  75 mg Oral Daily   heparin  5,000 Units Subcutaneous Q12H   pantoprazole (PROTONIX) IV  40 mg Intravenous Q12H   senna  1 tablet Oral Daily   sodium chloride flush  3 mL Intravenous Q12H   Continuous Infusions:  ciprofloxacin 400 mg (01/17/22 1010)   metronidazole 500 mg (01/17/22 0611)     LOS: 3 days    Time spent: 35 mins   Genelle Economou, MD [facility]   If 7PM-7AM, please contact night-coverage

## 2022-01-17 NOTE — Care Management Important Message (Signed)
Important Message  Patient Details  Name: Claire Washington MRN: [id number] Date of Birth: July 22, 1924   Medicare Important Message Given:  Yes     Dannette Barbara 01/17/2022, 11:23 AM

## 2022-01-17 NOTE — Progress Notes (Signed)
PRN Norco admin at 1136 for pain per pt request. 1250 pt c/o seeing people on her bed and in her room, but states she thinks it is a drug reaction from pain medication. Pt declines PRN Tylenol at this time for pain control. RN educated and encouraged use of PRN Tylenol for pain. Pt and daughter verbalized understanding. Shawna Clamp, MD made aware. No new orders.

## 2022-01-17 NOTE — TOC Progression Note (Signed)
Transition of Care [facility]) - Progression Note    Patient Details  Name: Claire Washington MRN: [id number] Date of Birth: Jan 12, 1925  [facility]) CM/SW Contact  Beverly Sessions, RN Phone Number: 01/17/2022, 2:04 PM  Clinical Narrative:    Daughter Marcie Bal at bedside requesting SNF placement  Confirmed with patient and daughter Dorian Pod that patient will be going home,  Dorian Pod will be staying with patient at discharge.   Marcie Bal was present for the conversation and is aware patient will discharge home  Referral made to [facility] with adapt for home O2    Expected Discharge Plan: [place] Barriers to Discharge: Continued Medical Work up  Expected Discharge Plan and Services Expected Discharge Plan: [place] In-house Referral: Clinical Social Work   Post Acute Care Choice: Home Health, Resumption of Svcs/PTA Provider Living arrangements for the past 2 months: Single Family Home                                       Social Determinants of Health (SDOH) Interventions    Readmission Risk Interventions     No data to display

## 2022-01-17 NOTE — Progress Notes (Signed)
Physical Therapy Treatment Patient Details Name: Claire Washington MRN: [id number] DOB: Dec 08, 1924 Today's Date: 01/17/2022   History of Present Illness Pt is a 88 y/o F admitted on 01/13/22 after presenting to the ED with c/o N&V & diarrhea, as well as abdominal pain, x 4 days. Work-up reveals sigmoid diverticulitis. PMH arthritis, basal cell carcinoma, DM2, embolic stroke, HLD, essential HTN, SDH    PT Comments    Pt is making limited progress towards goals and is frustrated that she isn't getting better quick enough. Demonstrates some anxiety with discharge to home. All mobility performed on RA with sats decreasing, needing to be returned to 1L of O2. Able to sit at EOB for extended time for med pass before ambulation to recliner. Reports she is unable to ambulate further distance at this time. Poor endurance with IS, unable to perform as well as yesterday. Will continue to progress. SaO2 on room air at rest = 84% SaO2 on room air while ambulating = n/a% SaO2 on 1 liters of O2 while ambulating = 89%    Recommendations for follow up therapy are one component of a multi-disciplinary discharge planning process, led by the attending physician.  Recommendations may be updated based on patient status, additional functional criteria and insurance authorization.  Follow Up Recommendations  Skilled nursing-short term rehab (<3 hours/day) Can patient physically be transported by private vehicle: No   Assistance Recommended at Discharge Frequent or constant Supervision/Assistance  Patient can return home with the following A lot of help with bathing/dressing/bathroom;A lot of help with walking and/or transfers;Assist for transportation;Assistance with cooking/housework;Help with stairs or ramp for entrance   Equipment Recommendations  None recommended by PT    Recommendations for Other Services       Precautions / Restrictions Precautions Precautions: Fall Restrictions Weight Bearing  Restrictions: No     Mobility  Bed Mobility Overal bed mobility: Needs Assistance Bed Mobility: Supine to Sit     Supine to sit: Min assist     General bed mobility comments: needed assist for trunkal elevation. Once seated at EOB, able to scoot towards EOB.    Transfers Overall transfer level: Needs assistance Equipment used: Rolling walker (2 wheels) Transfers: Sit to/from Stand, Bed to chair/wheelchair/BSC Sit to Stand: Min assist           General transfer comment: improved stand, however once comes upright, begins to jerk and demonstrate post lean. Needs constant assist to maintain upright until it subsides.    Ambulation/Gait Ambulation/Gait assistance: Min assist Gait Distance (Feet): 5 Feet Assistive device: Rolling walker (2 wheels) Gait Pattern/deviations: Step-to pattern       General Gait Details: ambulated over to recliner with seated rest break. Encouraged pt to ambulate further, however pt reports she is unable.   Stairs             Wheelchair Mobility    Modified Rankin (Stroke Patients Only)       Balance Overall balance assessment: Needs assistance Sitting-balance support: Feet supported, Bilateral upper extremity supported Sitting balance-Leahy Scale: Fair   Postural control: Posterior lean Standing balance support: Bilateral upper extremity supported, Reliant on assistive device for balance Standing balance-Leahy Scale: Poor                              Cognition Arousal/Alertness: Awake/alert Behavior During Therapy: WFL for tasks assessed/performed, Anxious Overall Cognitive Status: Within Functional Limits for tasks assessed  General Comments: pleasant and agreeable, now has concerns about discharging to home        Exercises Other Exercises Other Exercises: seated ther-ex performed on B LE including alt marching and LAQ. 10  reps with supervision    General  Comments        Pertinent Vitals/Pain Pain Assessment Pain Assessment: Faces Faces Pain Scale: Hurts little more Pain Location: B LE and back Pain Descriptors / Indicators: Aching, Discomfort Pain Intervention(s): Limited activity within patient's tolerance, Repositioned    Home Living                          Prior Function            PT Goals (current goals can now be found in the care plan section) Acute Rehab PT Goals Patient Stated Goal: get better PT Goal Formulation: With patient Time For Goal Achievement: 01/29/22 Potential to Achieve Goals: Good Progress towards PT goals: Progressing toward goals    Frequency    Min 2X/week      PT Plan Current plan remains appropriate    Co-evaluation              AM-PAC PT "6 Clicks" Mobility   Outcome Measure  Help needed turning from your back to your side while in a flat bed without using bedrails?: A Lot Help needed moving from lying on your back to sitting on the side of a flat bed without using bedrails?: A Lot Help needed moving to and from a bed to a chair (including a wheelchair)?: A Little Help needed standing up from a chair using your arms (e.g., wheelchair or bedside chair)?: A Lot Help needed to walk in hospital room?: A Lot Help needed climbing 3-5 steps with a railing? : Total 6 Click Score: 12    End of Session Equipment Utilized During Treatment: Gait belt;Oxygen Activity Tolerance: Patient limited by fatigue Patient left: in chair;with chair alarm set;with family/visitor present Nurse Communication: Mobility status PT Visit Diagnosis: Unsteadiness on feet (R26.81);Difficulty in walking, not elsewhere classified (R26.2);Muscle weakness (generalized) (M62.81);Other abnormalities of gait and mobility (R26.89)     Time: 1000-1025 PT Time Calculation (min) (ACUTE ONLY): 25 min  Charges:  $Gait Training: 8-22 mins $Therapeutic Exercise: 8-22 mins                     Greggory Stallion, PT, DPT, GCS [phone]    Jakaiden Fill 01/17/2022, 10:53 AM

## 2022-01-17 NOTE — Plan of Care (Signed)
  Problem: Pain Managment: Goal: General experience of comfort will improve Outcome: Progressing   Problem: Safety: Goal: Ability to remain free from injury will improve Outcome: Progressing   Problem: Skin Integrity: Goal: Risk for impaired skin integrity will decrease Outcome: Progressing   

## 2022-01-18 DIAGNOSIS — J9601 Acute respiratory failure with hypoxia: Secondary | ICD-10-CM | POA: Diagnosis present

## 2022-01-18 DIAGNOSIS — R112 Nausea with vomiting, unspecified: Secondary | ICD-10-CM | POA: Diagnosis not present

## 2022-01-18 DIAGNOSIS — R197 Diarrhea, unspecified: Secondary | ICD-10-CM | POA: Diagnosis not present

## 2022-01-18 MED ORDER — SENNA 8.6 MG PO TABS: 1.0000 | ORAL_TABLET | Freq: Every day | ORAL | 0 refills | Status: DC

## 2022-01-18 MED ORDER — ALBUTEROL SULFATE HFA 108 (90 BASE) MCG/ACT IN AERS: 2.0000 | INHALATION_SPRAY | Freq: Four times a day (QID) | RESPIRATORY_TRACT | 2 refills | Status: DC | PRN

## 2022-01-18 MED ORDER — ACETAMINOPHEN 325 MG PO TABS: 650.0000 mg | ORAL_TABLET | Freq: Four times a day (QID) | ORAL | Status: AC | PRN

## 2022-01-18 MED ORDER — AMOXICILLIN-POT CLAVULANATE 875-125 MG PO TABS: 1.0000 | ORAL_TABLET | Freq: Two times a day (BID) | ORAL | 0 refills | Status: AC

## 2022-01-18 NOTE — Progress Notes (Signed)
Occupational Therapy Treatment Patient Details Name: Claire Washington MRN: [id number] DOB: [birth year] Today's Date: 01/18/2022   History of present illness Pt is a 88 y/o F admitted on 01/13/22 after presenting to the ED with c/o N&V & diarrhea, as well as abdominal pain, x 4 days. Work-up reveals sigmoid diverticulitis. PMH arthritis, basal cell carcinoma, DM2, embolic stroke, HLD, essential HTN, SDH   OT comments  Upon entering session, pt sitting in recliner and agreeable to OT. Son present in room. Pt agreeable to complete seated grooming tasks. Required set up A to [place] (for opening toothpaste). Pt deferred further grooming and toileting tasks 2/2 just transferring to recliner with PT. Pt left as received with all needs in reach. Pt is making progress toward goal completion. D/C recommendation remains appropriate. OT will continue to follow acutely.      Recommendations for follow up therapy are one component of a multi-disciplinary discharge planning process, led by the attending physician.  Recommendations may be updated based on patient status, additional functional criteria and insurance authorization.    Follow Up Recommendations  Skilled nursing-short term rehab (<3 hours/day)    Assistance Recommended at Discharge Frequent or constant Supervision/Assistance  Patient can return home with the following  A lot of help with bathing/dressing/bathroom;A lot of help with walking and/or transfers;Assist for transportation;Assistance with cooking/housework;Help with stairs or ramp for entrance   Equipment Recommendations  Other (comment) (defer to next venue of care)    Recommendations for Other Services      Precautions / Restrictions Precautions Precautions: Fall Restrictions Weight Bearing Restrictions: No       Mobility Bed Mobility Overal bed mobility: Needs Assistance             General bed mobility comments: received in chair    Transfers Overall transfer  level: Needs assistance                 General transfer comment: pt deferred     Balance Overall balance assessment: Needs assistance                                         ADL either performed or assessed with clinical judgement   ADL Overall ADL's : Needs assistance/impaired     Grooming: Wash/dry face;Set up;Sitting;Oral care;Minimal assistance                                 General ADL Comments: Pt deferred further grooming and toileting tasks 2/2 just transferring to recliner with PT.    Extremity/Trunk Assessment Upper Extremity Assessment Upper Extremity Assessment: Generalized weakness   Lower Extremity Assessment Lower Extremity Assessment: Generalized weakness        Vision Baseline Vision/History: 1 Wears glasses Patient Visual Report: No change from baseline     Perception     Praxis      Cognition Arousal/Alertness: Awake/alert Behavior During Therapy: WFL for tasks assessed/performed, Anxious Overall Cognitive Status: Within Functional Limits for tasks assessed                                          Exercises      Shoulder Instructions       General Comments  Pertinent Vitals/ Pain       Pain Assessment Pain Assessment: No/denies pain  Home Living                                          Prior Functioning/Environment              Frequency  Min 2X/week        Progress Toward Goals  OT Goals(current goals can now be found in the care plan section)  Progress towards OT goals: Progressing toward goals  Acute Rehab OT Goals Patient Stated Goal: go home OT Goal Formulation: With patient Time For Goal Achievement: 01/29/22 Potential to Achieve Goals: Good  Plan Discharge plan remains appropriate;Frequency remains appropriate    Co-evaluation                 AM-PAC OT "6 Clicks" Daily Activity     Outcome Measure   Help from another  person eating meals?: None Help from another person taking care of personal grooming?: A Little Help from another person toileting, which includes using toliet, bedpan, or urinal?: A Lot Help from another person bathing (including washing, rinsing, drying)?: A Lot Help from another person to put on and taking off regular upper body clothing?: A Little Help from another person to put on and taking off regular lower body clothing?: A Lot 6 Click Score: 16    End of Session Equipment Utilized During Treatment: Oxygen (2L O2 via [place])  OT Visit Diagnosis: Other abnormalities of gait and mobility (R26.89);Muscle weakness (generalized) (M62.81);Pain;Dizziness and giddiness (R42)   Activity Tolerance Patient limited by fatigue   Patient Left in chair;with call bell/phone within reach;with chair alarm set;with family/visitor present   Nurse Communication Mobility status        Time: 0981-1914 OT Time Calculation (min): 10 min  Charges: OT General Charges $OT Visit: 1 Visit OT Treatments $Self Care/Home Management : 8-22 mins  [facility] MS, OTR/L ascom [phone]  01/18/22, 1:42 PM

## 2022-01-18 NOTE — Progress Notes (Signed)
Mobility Specialist - Progress Note   01/18/22 1000  Mobility  Activity Ambulated with assistance in room;Transferred from bed to chair  Activity Response Tolerated well  Distance Ambulated (ft) 4 ft  $Mobility charge 1 Mobility  Level of Assistance Contact guard assist, steadying assist  Assistive Device Front wheel walker  Mobility Referral Yes     Pt lying in bed upon arrival, utilizing 2L. Pt able to come into sitting EOB with minA. CGA to stand---noted post lean and jerking upon standing that requires extra time to subside. VC for sequencing steps to recliner. Pt appears mildly anxious with transfer but calming technique and pursed-lip breathing utilized throughout session. Pt left in chair with alarm set, needs in reach, family at bedside.    Kathee Delton Mobility Specialist 01/18/22, 11:12 AM

## 2022-01-18 NOTE — Progress Notes (Signed)
Patient is alert and oriented x4. Around midnight, patient called out stating that she was having delusions. What she described to me sounded like she was having a dream. Stated that when she closed her eyes, she saw herself driving through a tunnel or cave. Left night light on and door open. Patient finally went to sleep for the night. Denied pain.

## 2022-01-18 NOTE — Discharge Summary (Signed)
Physician Discharge Summary   Patient: Claire Washington MRN: [id number] DOB: [birth year]  Admit date:     01/13/2022  Discharge date: 01/18/22  Discharge Physician: Ezekiel Slocumb   PCP: Tracie Harrier, MD   Recommendations at discharge:   Follow up with PCP in 1 week Repeat BMP, CBC in 1 week Follow up on oxygen requirement, pt discharging with home O2 Consider referral to pulmonology for evaluation of hypoxia and wheezing Follow up on completion of Augmentin for diverticulitis and monitor for signs of recurrence.   Discharge Diagnoses: Principal Problem:   Nausea vomiting and diarrhea Active Problems:   Hypertension   Diabetes mellitus ([place])   Sigmoid diverticulitis   Acute respiratory failure with hypoxia (HCC)  Resolved Problems:   * No resolved hospital problems. [facility] Course: This 88 years old female with PMH significant for arthritis, basal cell carcinoma, type 2 diabetes, history of embolic stroke, hyperlipidemia, essential hypertension, history of subdural hematoma, presented in the ED with complaints of nausea, vomiting, diarrhea and abdominal pain for 4 days.  Work-up in the ED reveals sigmoid diverticulitis.  Patient is started on IV antibiotics and admitted for further management.    Assessment and Plan: Acute sigmoid diverticulitis: Patient presented with abdominal pain, nausea, vomiting and diarrhea for 4 days. CT abdomen and pelvis consistent with acute sigmoid diverticulitis. Patient started on empiric antibiotics ( ciprofloxacin and Flagyl) Treated with IV fluids, Zofran as needed for nausea and vomiting. Lactic acid 0.8, No signs of sepsis. Needs total 10 days of Antibiotics - Augmentin 5 more days at discharge.    Diabetes mellitus type 2: Diet controlled.  Carb modified diet.   Essential hypertension: Continue amlodipine. We held metoprolol due to wheezing and low BP. PCP follow up.   Hyperlipidemia: Continue atorvastatin.    GERD: Continue pantoprazole 40 mg daily.   Generalized weakness: PT and OT recommended SNF, declined by pt and family, they want pt to go home. Home health services been arranged.     Wheezing: She denies any history of asthma.  Found to have wheezing. Started on DuoNeb every 6 as needed. Prescribed albuterol inhaler at discharge. Recommend pulmonology referral as outpatient for further evaluation.       Consultants: none Procedures performed: none  Disposition: Home health Diet recommendation:  Cardiac diet DISCHARGE MEDICATION: Allergies as of 01/18/2022       Reactions   Latex Dermatitis   Hydrocortisone Swelling   Sulfa Antibiotics Other (See Comments)   Reaction: unknown Other reaction(s): Not available   Tape Dermatitis        Medication List     STOP taking these medications    atorvastatin 10 MG tablet Commonly known as: LIPITOR   busPIRone 5 MG tablet Commonly known as: BUSPAR   metoprolol succinate 25 MG 24 hr tablet Commonly known as: TOPROL-XL   mupirocin ointment 2 % Commonly known as: BACTROBAN       TAKE these medications    acetaminophen 325 MG tablet Commonly known as: TYLENOL Take 2 tablets (650 mg total) by mouth every 6 (six) hours as needed for mild pain, moderate pain, fever or headache (or Fever >/= 101).   albuterol 108 (90 Base) MCG/ACT inhaler Commonly known as: VENTOLIN HFA Inhale 2 puffs into the lungs every 6 (six) hours as needed for wheezing or shortness of breath.   amLODipine 5 MG tablet Commonly known as: NORVASC Take 1.5 tablets (7.5 mg total) by mouth every evening. Take 1 tablet (5  mg) in the morning and 1/2 tablet (2.5 mg) in the evening   amoxicillin-clavulanate 875-125 MG tablet Commonly known as: AUGMENTIN Take 1 tablet by mouth 2 (two) times daily for 5 days.   CENTRUM SILVER PO Take 1 tablet by mouth daily. Reported on 06/11/2015   clopidogrel 75 MG tablet Commonly known as: PLAVIX Take 1 tablet  (75 mg total) by mouth daily.   cyanocobalamin 1000 MCG tablet Commonly known as: VITAMIN B12 Take 500 mcg by mouth daily.   hydrALAZINE 25 MG tablet Commonly known as: APRESOLINE TAKE 1 TABLET BY MOUTH 3 TIMES DAILY AS NEEDED FOR SYSTOLIC BLOOD PRESSURE >124   senna 8.6 MG Tabs tablet Commonly known as: SENOKOT Take 1 tablet (8.6 mg total) by mouth daily. Hold if having loose or frequent stools. Start taking on: January 19, 2022   Vitamin D (Ergocalciferol) 1.25 MG (50000 UNIT) Caps capsule Commonly known as: DRISDOL Take 50,000 Units by mouth every 7 (seven) days.               Durable Medical Equipment  (From admission, onward)           Start     Ordered   01/17/22 1402  For home use only DME oxygen  Once       Question Answer Comment  Length of Need 12 Months   Mode or (Route) Mask   Liters per Minute 1   Frequency Continuous (stationary and portable oxygen unit needed)   Oxygen conserving device No   Oxygen delivery system Gas      01/17/22 1402            Discharge Exam: Filed Weights   01/16/22 0454 01/17/22 0301 01/18/22 0500  Weight: 59 kg 59.1 kg 58.9 kg   General exam: awake, alert, no acute distress, frail HEENT: atraumatic, clear conjunctiva, anicteric sclera, moist mucus membranes, hearing grossly normal  Respiratory system: CTAB, no wheezes, rales or rhonchi, normal respiratory effort. Cardiovascular system: normal S1/S2, RRR, no pedal edema.   Gastrointestinal system: soft, NT, ND Central nervous system: A&Ox3 no gross focal neurologic deficits, normal speech Extremities: moves all, no edema, normal tone Skin: dry, intact, normal temperature, normal color, No rashes, lesions or ulcers Psychiatry: normal mood, congruent affect, judgement and insight appear normal   Condition at discharge: stable  The results of significant diagnostics from this hospitalization (including imaging, microbiology, ancillary and laboratory) are listed  below for reference.   Imaging Studies: CT Abdomen Pelvis W Contrast  Result Date: 01/13/2022 CLINICAL DATA:  Nausea and vomiting for 2 days EXAM: CT ABDOMEN AND PELVIS WITH CONTRAST TECHNIQUE: Multidetector CT imaging of the abdomen and pelvis was performed using the standard protocol following bolus administration of intravenous contrast. RADIATION DOSE REDUCTION: This exam was performed according to the departmental dose-optimization program which includes automated exposure control, adjustment of the mA and/or kV according to patient size and/or use of iterative reconstruction technique. CONTRAST:  94m OMNIPAQUE IOHEXOL 300 MG/ML  SOLN COMPARISON:  CT abdomen and pelvis 12/29/2014 FINDINGS: Lower chest: Mitral annular calcification. Normal heart size. No pericardial effusion. Hepatobiliary: No suspicious hepatic lesion. Cholelithiasis. No evidence of cholecystitis. No biliary dilation. Pancreas: Unremarkable. Spleen: Unremarkable. Adrenals/Urinary Tract: Unremarkable adrenal glands. Low-attenuation lesions in the kidneys are statistically likely to represent cysts. No follow-up is required. No urinary calculi or hydronephrosis. Unremarkable bladder. Stomach/Bowel: Extensive sigmoid diverticulosis with adjacent wall thickening and inflammatory fat stranding. No drainable fluid collection or abscess. Normal caliber large and small bowel.  Stomach is unremarkable. Vascular/Lymphatic: Aortic atherosclerosis. No enlarged abdominal or pelvic lymph nodes. Reproductive: Hysterectomy. Other: No free intraperitoneal air. Musculoskeletal: Thoracolumbar curve. Thoracolumbar spondylosis. No acute osseous abnormality. IMPRESSION: Acute uncomplicated sigmoid diverticulitis. Electronically Signed   By: Placido Sou M.D.   On: 01/13/2022 22:30    Microbiology: Results for orders placed or performed during the hospital encounter of 01/13/22  Resp Panel by RT-PCR (Flu A&B, Covid) Anterior Nasal Swab     Status: None    Collection Time: 01/13/22  8:15 PM   Specimen: Anterior Nasal Swab  Result Value Ref Range Status   SARS Coronavirus 2 by RT PCR NEGATIVE NEGATIVE Final    Comment: (NOTE) SARS-CoV-2 target nucleic acids are NOT DETECTED.  The SARS-CoV-2 RNA is generally detectable in upper respiratory specimens during the acute phase of infection. The lowest concentration of SARS-CoV-2 viral copies this assay can detect is 138 copies/mL. A negative result does not preclude SARS-Cov-2 infection and should not be used as the sole basis for treatment or other patient management decisions. A negative result may occur with  improper specimen collection/handling, submission of specimen other than nasopharyngeal swab, presence of viral mutation(s) within the areas targeted by this assay, and inadequate number of viral copies(<138 copies/mL). A negative result must be combined with clinical observations, patient history, and epidemiological information. The expected result is Negative.  Fact Sheet for Patients:  EntrepreneurPulse.com.au  Fact Sheet for Healthcare Providers:  IncredibleEmployment.be  This test is no t yet approved or cleared by the [place] FDA and  has been authorized for detection and/or diagnosis of SARS-CoV-2 by FDA under an Emergency Use Authorization (EUA). This EUA will remain  in effect (meaning this test can be used) for the duration of the COVID-19 declaration under Section 564(b)(1) of the Act, 21 U.S.C.section 360bbb-3(b)(1), unless the authorization is terminated  or revoked sooner.       Influenza A by PCR NEGATIVE NEGATIVE Final   Influenza B by PCR NEGATIVE NEGATIVE Final    Comment: (NOTE) The Xpert Xpress SARS-CoV-2/FLU/RSV plus assay is intended as an aid in the diagnosis of influenza from Nasopharyngeal swab specimens and should not be used as a sole basis for treatment. Nasal washings and aspirates are unacceptable for  Xpert Xpress SARS-CoV-2/FLU/RSV testing.  Fact Sheet for Patients: EntrepreneurPulse.com.au  Fact Sheet for Healthcare Providers: IncredibleEmployment.be  This test is not yet approved or cleared by the [place] FDA and has been authorized for detection and/or diagnosis of SARS-CoV-2 by FDA under an Emergency Use Authorization (EUA). This EUA will remain in effect (meaning this test can be used) for the duration of the COVID-19 declaration under Section 564(b)(1) of the Act, 21 U.S.C. section 360bbb-3(b)(1), unless the authorization is terminated or revoked.  Performed at [facility], [address] [place], [place] [address]   Blood culture (routine x 2)     Status: None (Preliminary result)   Collection Time: 01/14/22  6:51 AM   Specimen: BLOOD  Result Value Ref Range Status   Specimen Description BLOOD RIGHT ANTECUBITAL  Final   Special Requests   Final    BOTTLES DRAWN AEROBIC AND ANAEROBIC Blood Culture adequate volume   Culture   Final    NO GROWTH 4 DAYS Performed at [facility], [address] [place], [place] [address]    Report Status PENDING  Incomplete  Culture, blood (Routine X 2) w Reflex to ID Panel     Status: None (Preliminary result)  Collection Time: 01/15/22  5:29 PM   Specimen: BLOOD RIGHT ARM  Result Value Ref Range Status   Specimen Description BLOOD RIGHT ARM  Final   Special Requests   Final    BOTTLES DRAWN AEROBIC AND ANAEROBIC Blood Culture adequate volume   Culture   Final    NO GROWTH 3 DAYS Performed at [facility], [address] [place], [place] [address]    Report Status PENDING  Incomplete    Labs: CBC: Recent Labs  Lab 01/13/22 1714 01/14/22 0651 01/15/22 0603 01/16/22 0621  WBC 15.1* 12.2* 8.3 6.8  HGB 13.8 13.0 11.8* 11.6*  HCT 42.1 39.7 36.5 35.2*  MCV 85.4 86.1 86.9 86.1  PLT 202 164 162 893   Basic Metabolic Panel: Recent Labs  Lab  01/13/22 1714 01/14/22 0651 01/16/22 0621 01/17/22 0903  NA 134* 138 136 138  K 3.7 3.6 3.4* 3.9  CL 103 110 108 110  CO2 '22 24 23 24  '$ GLUCOSE 178* 123* 96 115*  BUN '18 11 8 '$ 6*  CREATININE 0.63 0.59 0.69 0.67  CALCIUM 8.5* 7.9* 7.8* 8.0*  MG  --   --  1.9  --    Liver Function Tests: Recent Labs  Lab 01/13/22 1714 01/14/22 0651  AST 12* 10*  ALT 11 11  ALKPHOS 54 52  BILITOT 1.4* 1.1  PROT 6.5 5.6*  ALBUMIN 3.5 2.9*   CBG: No results for input(s): "GLUCAP" in the last 168 hours.  Discharge time spent: less than 30 minutes.  Signed: Ezekiel Slocumb, DO [facility] 01/18/2022

## 2022-01-18 NOTE — TOC Transition Note (Signed)
Transition of Care [facility]) - CM/SW Discharge Note   Patient Details  Name: Claire Washington MRN: [id number] Date of Birth: [birth year]  Transition of Care [facility]) CM/SW Contact:  Beverly Sessions, RN Phone Number: 01/18/2022, 11:50 AM   Clinical Narrative:    Patient to discharge today [name] with [facility] notified of discharge Per conversation with family yesterday daughter Dorian Pod to transport   Final next level of care: [place] Barriers to Discharge: Continued Medical Work up   Patient Goals and CMS Choice Patient states their goals for this hospitalization and ongoing recovery are:: To return home      Discharge Placement                       Discharge Plan and Services In-house Referral: Clinical Social Work   Post Acute Care Choice: Home Health, Resumption of Svcs/PTA Provider                               Social Determinants of Health (SDOH) Interventions     Readmission Risk Interventions     No data to display

## 2022-01-18 NOTE — TOC Progression Note (Signed)
Transition of Care [facility]) - Progression Note    Patient Details  Name: Kenniyah Sasaki MRN: [id number] Date of Birth: May 26, 1924  Transition of Care [facility]) CM/SW Contact  Beverly Sessions, RN Phone Number: 01/18/2022, 9:30 AM  Clinical Narrative:      Notification from [facility] with adapt that portable O2 was delivered to room   Expected Discharge Plan: [place] Barriers to Discharge: Continued Medical Work up  Expected Discharge Plan and Services Expected Discharge Plan: [name] In-house Referral: Clinical Social Work   Post Acute Care Choice: Home Health, Resumption of Svcs/PTA Provider Living arrangements for the past 2 months: Single Family Home                                       Social Determinants of Health (SDOH) Interventions    Readmission Risk Interventions     No data to display

## 2022-01-18 NOTE — Progress Notes (Signed)
Physical Therapy Treatment Patient Details Name: Claire Washington MRN: [id number] DOB: 06/06/1924 Today's Date: 01/18/2022   History of Present Illness Pt is a 88 y/o F admitted on 01/13/22 after presenting to the ED with c/o N&V & diarrhea, as well as abdominal pain, x 4 days. Work-up reveals sigmoid diverticulitis. PMH arthritis, basal cell carcinoma, DM2, embolic stroke, HLD, essential HTN, SDH    PT Comments    Pt is making good progress towards goals with ability to ambulate slightly increased distance this date. Decreased jerking episodes noted and improved functional independence. All mobility performed on O2. IS performed with pt able to consistently reach 576m. Will continue to progress as able.   Recommendations for follow up therapy are one component of a multi-disciplinary discharge planning process, led by the attending physician.  Recommendations may be updated based on patient status, additional functional criteria and insurance authorization.  Follow Up Recommendations  Home health PT Can patient physically be transported by private vehicle: Yes   Assistance Recommended at Discharge Frequent or constant Supervision/Assistance  Patient can return home with the following A lot of help with bathing/dressing/bathroom;A lot of help with walking and/or transfers;Assist for transportation;Assistance with cooking/housework;Help with stairs or ramp for entrance   Equipment Recommendations  None recommended by PT    Recommendations for Other Services       Precautions / Restrictions Precautions Precautions: Fall Restrictions Weight Bearing Restrictions: No     Mobility  Bed Mobility               General bed mobility comments: received in chair    Transfers Overall transfer level: Needs assistance Equipment used: Rolling walker (2 wheels) Transfers: Sit to/from Stand, Bed to chair/wheelchair/BSC Sit to Stand: Min guard           General transfer  comment: pushing from seated surface and demonstrating upright posture. No jerking noted this date and pt pleased to be doing better    Ambulation/Gait Ambulation/Gait assistance: Min guard Gait Distance (Feet): 10 Feet Assistive device: Rolling walker (2 wheels) Gait Pattern/deviations: Step-to pattern       General Gait Details: ambulated in room, somewhat self limiting. All mobility performed on O2. Very slow gait pattern and fearful of turns, needing cues for sequencing. Offered another opportunity for gait training, however pt declined due to fatigue   Stairs             Wheelchair Mobility    Modified Rankin (Stroke Patients Only)       Balance Overall balance assessment: Needs assistance Sitting-balance support: Feet supported, Bilateral upper extremity supported Sitting balance-Leahy Scale: Fair   Postural control: Posterior lean Standing balance support: Bilateral upper extremity supported, Reliant on assistive device for balance Standing balance-Leahy Scale: Poor                              Cognition Arousal/Alertness: Awake/alert Behavior During Therapy: WFL for tasks assessed/performed, Anxious Overall Cognitive Status: Within Functional Limits for tasks assessed                                 General Comments: pleasant and agreeable. Seems to be in good spirits today        Exercises      General Comments        Pertinent Vitals/Pain Pain Assessment Pain Assessment: Faces Faces Pain Scale: Hurts a  little bit Pain Location: B LE and back Pain Descriptors / Indicators: Aching, Discomfort Pain Intervention(s): Limited activity within patient's tolerance    Home Living                          Prior Function            PT Goals (current goals can now be found in the care plan section) Acute Rehab PT Goals Patient Stated Goal: get better PT Goal Formulation: With patient Time For Goal Achievement:  01/29/22 Potential to Achieve Goals: Good Progress towards PT goals: Progressing toward goals    Frequency    Min 2X/week      PT Plan Current plan remains appropriate    Co-evaluation              AM-PAC PT "6 Clicks" Mobility   Outcome Measure  Help needed turning from your back to your side while in a flat bed without using bedrails?: A Lot Help needed moving from lying on your back to sitting on the side of a flat bed without using bedrails?: A Lot Help needed moving to and from a bed to a chair (including a wheelchair)?: A Little Help needed standing up from a chair using your arms (e.g., wheelchair or bedside chair)?: A Little Help needed to walk in hospital room?: A Little Help needed climbing 3-5 steps with a railing? : A Lot 6 Click Score: 15    End of Session Equipment Utilized During Treatment: Gait belt;Oxygen Activity Tolerance: Patient limited by fatigue Patient left: in chair;with chair alarm set;with family/visitor present Nurse Communication: Mobility status PT Visit Diagnosis: Unsteadiness on feet (R26.81);Difficulty in walking, not elsewhere classified (R26.2);Muscle weakness (generalized) (M62.81);Other abnormalities of gait and mobility (R26.89)     Time: 6812-7517 PT Time Calculation (min) (ACUTE ONLY): 13 min  Charges:  $Gait Training: 8-22 mins                     Greggory Stallion, PT, DPT, GCS [phone]    Jeff Frieden 01/18/2022, 1:07 PM

## 2022-01-19 LAB — CULTURE, BLOOD (ROUTINE X 2)
Culture: NO GROWTH
Special Requests: ADEQUATE

## 2022-01-20 LAB — CULTURE, BLOOD (ROUTINE X 2)
Culture: NO GROWTH
Special Requests: ADEQUATE

## 2022-02-02 ENCOUNTER — Other Ambulatory Visit: Payer: Self-pay | Admitting: Cardiovascular Disease

## 2022-02-13 ENCOUNTER — Encounter (INDEPENDENT_AMBULATORY_CARE_PROVIDER_SITE_OTHER): Payer: Self-pay

## 2022-02-17 ENCOUNTER — Other Ambulatory Visit: Payer: Self-pay | Admitting: Cardiovascular Disease

## 2022-03-29 ENCOUNTER — Other Ambulatory Visit: Payer: Self-pay | Admitting: Cardiovascular Disease

## 2022-03-29 MED ORDER — AMLODIPINE BESYLATE 2.5 MG PO TABS: 2.5000 mg | ORAL_TABLET | Freq: Every evening | ORAL | 0 refills | Status: DC

## 2022-05-16 ENCOUNTER — Telehealth: Payer: Self-pay | Admitting: Cardiovascular Disease

## 2022-05-16 MED ORDER — CLOPIDOGREL BISULFATE 75 MG PO TABS: 75.0000 mg | ORAL_TABLET | Freq: Every day | ORAL | 0 refills | Status: DC

## 2022-05-16 NOTE — Telephone Encounter (Signed)
*  STAT* If patient is at the pharmacy, call can be transferred to refill team.   1. Which medications need to be refilled? (please list name of each medication and dose if known) clopidogrel (PLAVIX) 75 MG tablet   2. Which pharmacy/location (including street and city if local pharmacy) is medication to be sent to?  [id number]   3. Do they need a 30 day or 90 day supply? 30 day  Patient has been out of medication for 2 days.

## 2022-05-28 NOTE — Progress Notes (Unsigned)
Cardiology Office Note  Date:  05/29/2022   ID:  Claire Washington, DOB 06-18-1924, MRN [id number]  PCP:  Tracie Harrier, MD   Chief Complaint  Patient presents with   6 month follow up     "Doing well." Medications reviewed by the patient verbally.     HPI:  Claire Washington  is a 89ear-old woman with history of   hypertension, benign positional vertigo,  osteoarthritis,  hyperlipidemia,  chronic cystitis,  diabetes,  Long smoking history until age 89 Previous TIAs per the patient, CVA 2018 Carotid stenosis <39% b/l in 2019 Chronic  dizziness in her head, gait instability, for years, progressive who presents for routine followup of her dizziness, hypertension, TIA/CVAs  Last seen by myself in clinic August 2023 Lives at the [facility]  October 2023 in the hospital 5 days in hospital with nausea vomiting diarrhea,  Sigmoid diverticulitis, was very weak Discharged home with PT  Has power chair, walker, feels her weakness is slowly improving  Labs reviewed: HGB 15 CR 0.6 Total chol 273, LDL 171  Off lipitor, total chol 270, Was 170 on lipitior  Renal function normal in 2023 on multiple lab checks Taking amlodipine 7.5 mg in the evening Reports that she is off her metoprolol and not on Lipitor Family prefers her not to be on a statin Jump in cholesterol from 170 up to 270 without the medication Details discussed with her  EKG personally reviewed by myself on todays visit Normal sinus rhythm rate 79 bpm nonspecific ST abnormality  past medical history reviewed ER September 27, 2020 Weakness, given fluids felt better  ER September 29, 2020 for weakness Given fluids, felt better  Seen in the emergency room March 07, 2021 dehydration Blood pressure 0000000 systolic up to [id number]  Seen in the emergency room April 14, 2021 near syncope 1 year of watery diarrhea 1 month reported near syncope Near syncope when going to pick up groceries at [employer], EMS called, given IV  fluids felt better Work-up negative in the ER  For most of her visits above lab work reviewed, normal renal function Only prerenal lab appeared to be July 02, 2020  In the er 09/2020 generalized weakness and dizziness with standing that is been going on for about 4 months, worse in the last few weeks.    Diarrhea better to some degree Last 1.5 wks ago  Tired sleepy, fatigue Stopped losartan , feels dizziness better  Orthostatics negative: today Pressures 120s to 150s heart rate stays in the 70s reports dizziness throughout any position changes  EKG personally reviewed by myself on todays visit Shows normal sinus rhythm rate 67 bpm no significant ST-T wave changes  Past medical history reviewed Holter monitor reviewed from primary care, this shows predominant normal sinus rhythm, rare episodes of narrow complex tachycardia longest 6 beats, 3 beat run wide-complex  Echo 11/2020 reviewed in detail  1. Left ventricular ejection fraction, by estimation, is 70 to 75%. The  left ventricle has hyperdynamic function. The left ventricle has no  regional wall motion abnormalities. Left ventricular diastolic parameters  are consistent with Grade II diastolic  dysfunction (pseudonormalization).   2. Right ventricular systolic function is normal. The right ventricular  size is normal.   3. The mitral valve is degenerative. Trivial mitral valve regurgitation.   4. The aortic valve was not well visualized. Aortic valve regurgitation  is not visualized.   5. The inferior vena cava is normal in size with greater than 50%  respiratory variability, suggesting right atrial pressure of 3 mmHg.   Previously seen by neurology for chronic dizziness MRI brain without contrast done on 11/30/2017 which showed moderate atrophy and moderate microvascular ischemic changes. --old cerebellar stroke, microvascular ischemic changes, and inner ear issues: worsened.  Echo 08/2016 Normal LV function No  significant valve disease No indication from the echo as to cause of dizziness  Event monitor  08/2016 Normal sinus rhythm Rare PVCs, <1% No other significant arrhythmia noted Symptoms of dizziness did not seem to correlate to underlying arrhythmia  Chronic equilibrium and dizziness dating back several years, worse recently Difficult time with equilibrium.  Working with PT   PMH:   has a past medical history of Arthritis, Basal cell carcinoma, Chronic cystitis, Diabetes mellitus type 2, controlled, without complications ([place]), Disease of thyroid gland, Diverticulitis, Embolic stroke ([place]), Follicular neoplasm of thyroid, Hemorrhage of rectum and anus, Hyperlipidemia, Hyperlipidemia, unspecified, Hypertension, Leg pain, Osteoarthritis, Pernicious anemia, Small vessel disease ([place]), Spinal stenosis, lumbar region without neurogenic claudication, Subdural hematoma ([place]), Syncope and collapse, Thyroid nodule, and Vertigo & Chronic Dizziness.  PSH:    Past Surgical History:  Procedure Laterality Date   BILATERAL SALPINGOOPHORECTOMY     BREAST CYST EXCISION     ? side, yrs ago   COLONOSCOPY  01/26/2009   Diverticulosis   CYSTOSCOPY     HEMORRHOIDECTOMY BY SIMPLE LIGATION     PR COLONOSCOPY FLX W/ENDOSCOPIC MUCOSAL RESECTION  09/02/2014   Procedure: COLONOSCOPY, FLEXIBLE; WITH ENDOSCOPIC MUCOSAL RESECTION; Surgeon: Saunders Revel, MD; [facility]; Service: Gastroenterology   PR [place] FLX W/RMVL OF TUMOR POLYP LESION SNARE TQ  09/02/2014   Procedure: COLONOSCOPY FLEX; W/REMOV TUMOR/LES BY SNARE; Surgeon: Saunders Revel, MD; [facility]; Service: Gastroenterology   SKIN SURGERY     TOTAL ABDOMINAL HYSTERECTOMY W/ BILATERAL SALPINGOOPHORECTOMY  1970s    Current Outpatient Medications  Medication Sig Dispense Refill   acetaminophen (TYLENOL) 325 MG tablet Take 2 tablets (650 mg total) by mouth every 6 (six) hours as needed for mild pain,  moderate pain, fever or headache (or Fever >/= 101).     albuterol (VENTOLIN HFA) 108 (90 Base) MCG/ACT inhaler Inhale 2 puffs into the lungs every 6 (six) hours as needed for wheezing or shortness of breath. 8 g 2   amLODipine (NORVASC) 2.5 MG tablet Take 1 tablet (2.5 mg total) by mouth every evening. 90 tablet 0   amLODipine (NORVASC) 5 MG tablet Take 5 mg by mouth every evening.     clopidogrel (PLAVIX) 75 MG tablet Take 1 tablet (75 mg total) by mouth daily. 90 tablet 0   hydrALAZINE (APRESOLINE) 25 MG tablet TAKE 1 TABLET BY MOUTH 3 TIMES DAILY AS NEEDED FOR SYSTOLIC BLOOD PRESSURE [id number] 90 tablet 3   Multiple Vitamins-Minerals (CENTRUM SILVER PO) Take 1 tablet by mouth daily. Reported on 06/11/2015     senna (SENOKOT) 8.6 MG TABS tablet Take 1 tablet (8.6 mg total) by mouth daily. Hold if having loose or frequent stools. 120 tablet 0   vitamin B-12 (CYANOCOBALAMIN) 1000 MCG tablet Take 500 mcg by mouth daily.     Vitamin D, Ergocalciferol, (DRISDOL) 1.25 MG (50000 UT) CAPS capsule Take 50,000 Units by mouth every 7 (seven) days.     No current facility-administered medications for this visit.    Allergies:   Latex, Hydrocortisone, Sulfa antibiotics, and Tape   Social History:  The patient  reports that she quit smoking about 28 years  ago. Her smoking use included cigarettes. She has a 5.00 pack-year smoking history. She has never used smokeless tobacco. She reports current alcohol use of about 2.0 standard drinks of alcohol per week. She reports that she does not use drugs.   Family History:   family history includes Asthma in her daughter; Colon cancer in her mother; Diabetes in an other family member; Heart attack in her brother and father; Heart disease in her father; Prostate cancer in her father; Stroke in her father.   Review of Systems  Constitutional: Negative.   HENT: Negative.    Respiratory: Negative.    Cardiovascular: Negative.   Gastrointestinal: Negative.    Musculoskeletal: Negative.        Leg weakness  Neurological: Negative.   Psychiatric/Behavioral: Negative.    All other systems reviewed and are negative.   PHYSICAL EXAM: VS:  BP (!) 122/46 (BP Location: Left Arm, Patient Position: Sitting, Cuff Size: Normal)   Pulse 79   Ht 5' 1"$  (1.549 m)   Wt 119 lb (54 kg)   SpO2 95%   BMI 22.48 kg/m  , BMI Body mass index is 22.48 kg/m. Constitutional:  oriented to person, place, and time. No distress.  HENT:  Head: Grossly normal Eyes:  no discharge. No scleral icterus.  Neck: No JVD, no carotid bruits  Cardiovascular: Regular rate and rhythm, no murmurs appreciated Pulmonary/Chest: Clear to auscultation bilaterally, no wheezes or rails Abdominal: Soft.  no distension.  no tenderness.  Musculoskeletal: Normal range of motion Neurological:  normal muscle tone. Coordination normal. No atrophy Skin: Skin warm and dry Psychiatric: normal affect, pleasant  Recent Labs: 01/14/2022: ALT 11; TSH 1.841 01/16/2022: Hemoglobin 11.6; Magnesium 1.9; Platelets 189 01/17/2022: BUN 6; Creatinine, Ser 0.67; Potassium 3.9; Sodium 138    Lipid Panel No results found for: "CHOL", "HDL", "LDLCALC", "TRIG"    Wt Readings from Last 3 Encounters:  05/29/22 119 lb (54 kg)  01/18/22 129 lb 13.6 oz (58.9 kg)  11/29/21 122 lb 3.2 oz (55.4 kg)     ASSESSMENT AND PLAN:  Essential hypertension Reports she no longer takes metoprolol succinate She is taking amlodipine 7.5 mg in the evening, does not check blood pressure at home  Dizziness Chronic issue, does not appear to be a cardiac issue MRI showing  Strokes, microvascular disease  followed by neurology No further workup at this time  Chronic weakness Working with PT, uses a walker, has motorized scooter Lives at the [place]  Stroke Multiple embolic strokes on MRI carotid disease on the right followed by Dr. Lucky Cowboy Most recent ultrasound reviewed, nonobstructive On Plavix,  Chosen to stop her  statin,  encouraged by family to stop it  Near syncope/syncope Numerous episodes through 2022 Seen in the emergency room, diagnosed with " dehydration" Drinking lots of Pedialyte, coconut water Zio monitor with no significant arrhythmia Blood sugar stable, no indication of hypotension  Smoking hx Currently a nonsmoker  Type 2 diabetes mellitus with other circulatory complication, without long-term current use of insulin ([place]) Managed by primary care  Mixed hyperlipidemia Goal LDL less than 70,  Prefers not to be on Lipitor, cholesterol 170 now running to 70 without medication, details discussed with her   Total encounter time more than 30 minutes  Greater than 50% was spent in counseling and coordination of care with the patient   No orders of the defined types were placed in this encounter.    Signed, Esmond Plants, M.D., Ph.D. 05/29/2022  [place]  [place], [place]

## 2022-05-29 ENCOUNTER — Encounter: Payer: Self-pay | Admitting: Cardiovascular Disease

## 2022-05-29 ENCOUNTER — Ambulatory Visit: Payer: Medicare Other | Attending: Cardiovascular Disease | Admitting: Cardiovascular Disease

## 2022-05-29 VITALS — BP 122/46 | HR 79 | Ht 61.0 in | Wt 119.0 lb

## 2022-05-29 DIAGNOSIS — R42 Dizziness and giddiness: Secondary | ICD-10-CM

## 2022-05-29 DIAGNOSIS — E1159 Type 2 diabetes mellitus with other circulatory complications: Secondary | ICD-10-CM | POA: Diagnosis not present

## 2022-05-29 DIAGNOSIS — I10 Essential (primary) hypertension: Secondary | ICD-10-CM | POA: Diagnosis present

## 2022-05-29 DIAGNOSIS — Z8673 Personal history of transient ischemic attack (TIA), and cerebral infarction without residual deficits: Secondary | ICD-10-CM

## 2022-05-29 DIAGNOSIS — E785 Hyperlipidemia, unspecified: Secondary | ICD-10-CM

## 2022-05-29 DIAGNOSIS — R55 Syncope and collapse: Secondary | ICD-10-CM

## 2022-05-29 DIAGNOSIS — R0600 Dyspnea, unspecified: Secondary | ICD-10-CM

## 2022-05-29 DIAGNOSIS — R0989 Other specified symptoms and signs involving the circulatory and respiratory systems: Secondary | ICD-10-CM | POA: Diagnosis not present

## 2022-05-29 DIAGNOSIS — I340 Nonrheumatic mitral (valve) insufficiency: Secondary | ICD-10-CM

## 2022-05-29 DIAGNOSIS — I6523 Occlusion and stenosis of bilateral carotid arteries: Secondary | ICD-10-CM | POA: Diagnosis not present

## 2022-05-29 NOTE — Patient Instructions (Signed)
Medication Instructions:  No changes  If you need a refill on your cardiac medications before your next appointment, please call your pharmacy.   Lab work: No new labs needed  Testing/Procedures: No new testing needed  Follow-Up: At [facility], you and your health needs are our priority.  As part of our continuing mission to provide you with exceptional heart care, we have created designated Provider Care Teams.  These Care Teams include your primary Cardiologist (physician) and Advanced Practice Providers (APPs -  Physician Assistants and Nurse Practitioners) who all work together to provide you with the care you need, when you need it.  You will need a follow up appointment in 12 months  Providers on your designated Care Team:   Christopher Berge, NP Ryan Dunn, PA-C Cadence Furth, PA-C  COVID-19 Vaccine Information can be found at: [email or web address] For questions related to vaccine distribution or appointments, please email [email or web address] or call [phone].   

## 2022-06-27 ENCOUNTER — Telehealth: Payer: Self-pay | Admitting: Cardiovascular Disease

## 2022-06-27 MED ORDER — AMLODIPINE BESYLATE 2.5 MG PO TABS: 2.5000 mg | ORAL_TABLET | Freq: Every evening | ORAL | 2 refills | Status: DC

## 2022-06-27 NOTE — Telephone Encounter (Signed)
Requested Prescriptions   Signed Prescriptions Disp Refills   amLODipine (NORVASC) 2.5 MG tablet 90 tablet 2    Sig: Take 1 tablet (2.5 mg total) by mouth every evening.    Authorizing Provider: Minna Merritts    Ordering User: Raelene Bott, Tejon Gracie L

## 2022-06-27 NOTE — Telephone Encounter (Signed)
*  STAT* If patient is at the pharmacy, call can be transferred to refill team.   1. Which medications need to be refilled? (please list name of each medication and dose if known) amLODipine (NORVASC) 2.5 MG tablet   2. Which pharmacy/location (including street and city if local pharmacy) is medication to be sent to?  [id number] [address], [place]    3. Do they need a 30 day or 90 day supply? 90 day

## 2022-08-10 ENCOUNTER — Other Ambulatory Visit: Payer: Self-pay | Admitting: Cardiovascular Disease

## 2022-09-23 IMAGING — US US PELVIS COMPLETE
1 series · 15 of 16 positions shown · non-contrast
Comparison: Prior CT from 12/27/2018

CLINICAL DATA: Initial evaluation for pelvic pain in female.
History of prior hysterectomy.

EXAM:
TRANSABDOMINAL ULTRASOUND OF PELVIS
TECHNIQUE: Transabdominal ultrasound examination of the pelvis was performed
including evaluation of the uterus, ovaries, adnexal regions, and
pelvic cul-de-sac.

[Series 1: us pelvis complete · 15 of 16 slices shown]
[im 1/16]
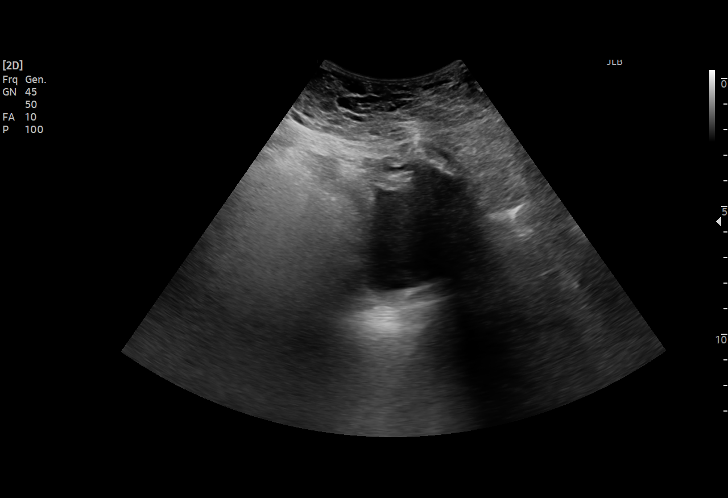
[im 2/16]
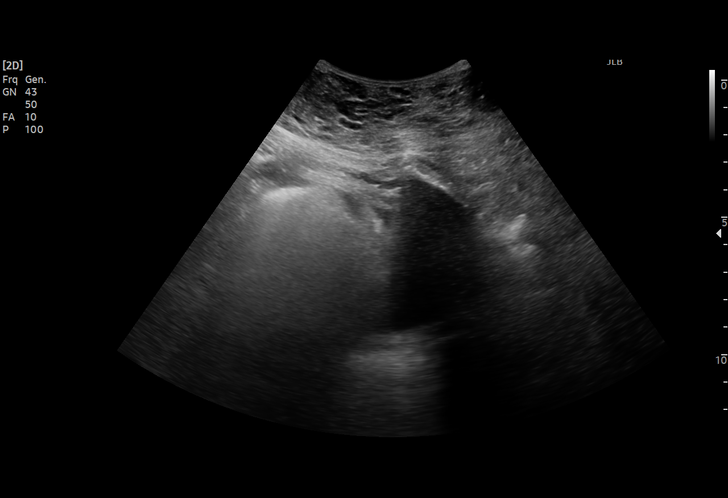
[im 3/16]
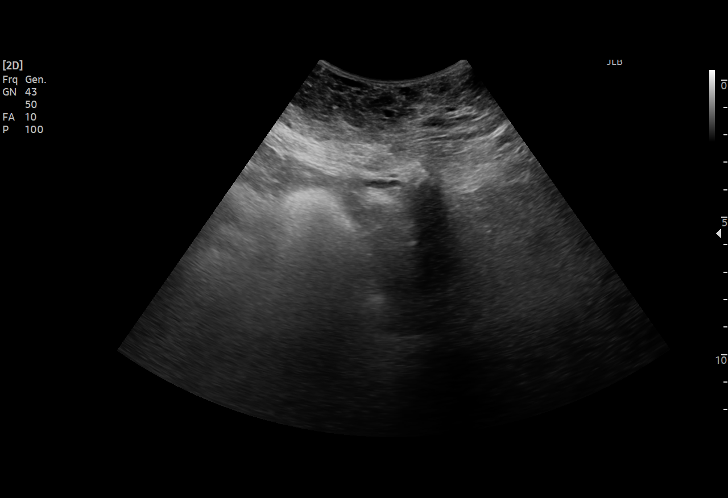
[im 4/16]
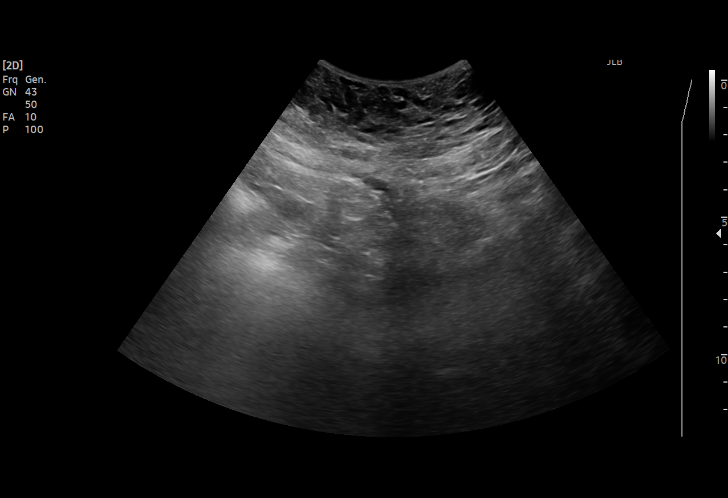
[im 5/16]
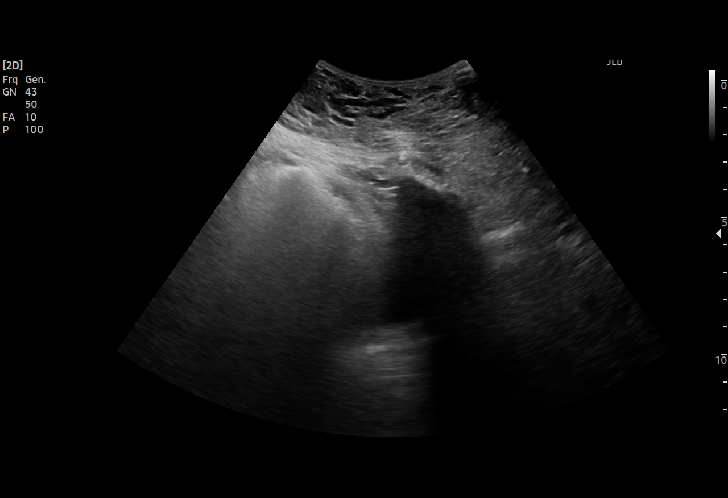
[im 6/16]
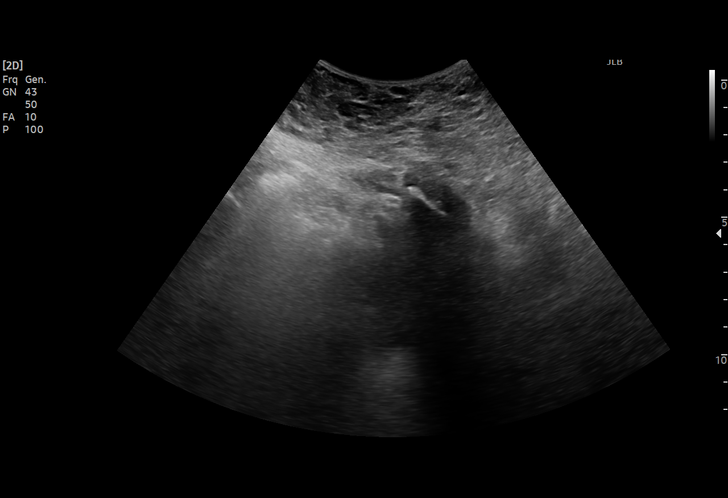
[im 7/16]
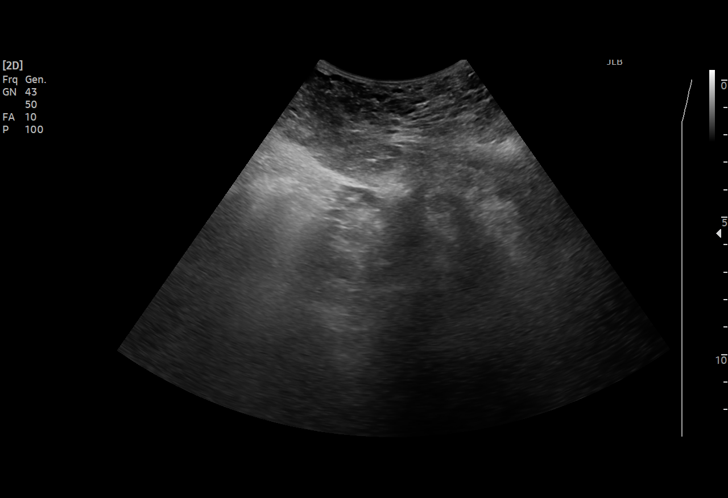
[im 9/16]
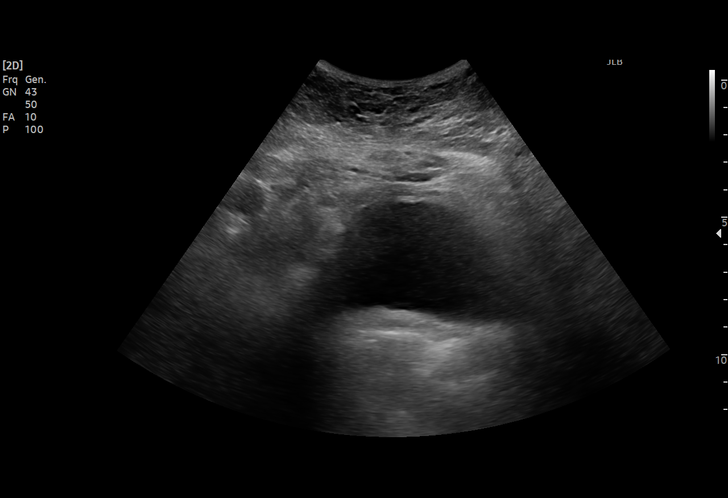
[im 10/16]
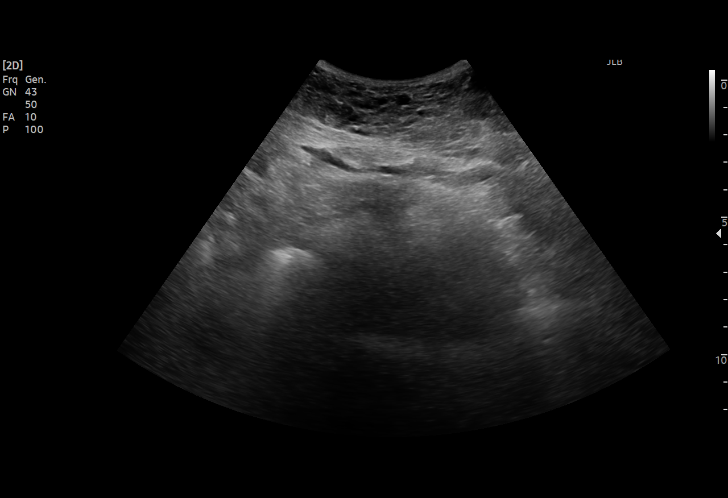
[im 11/16]
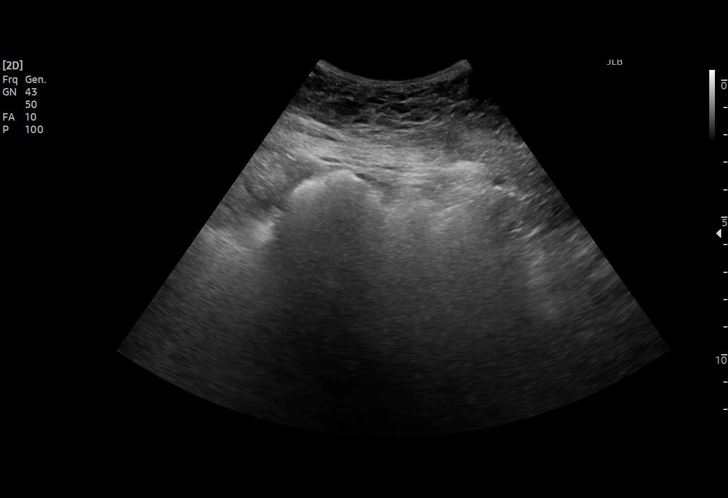
[im 12/16]
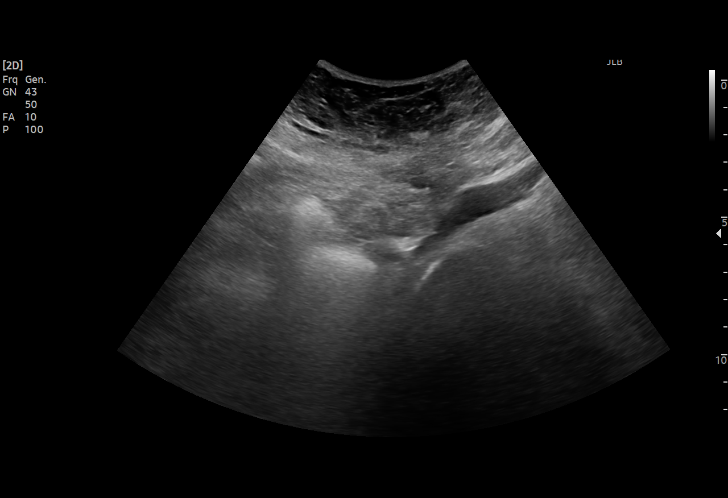
[im 13/16]
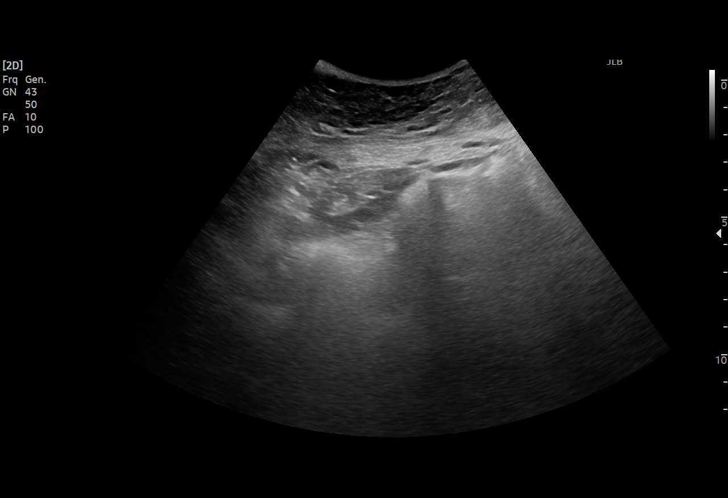
[im 14/16]
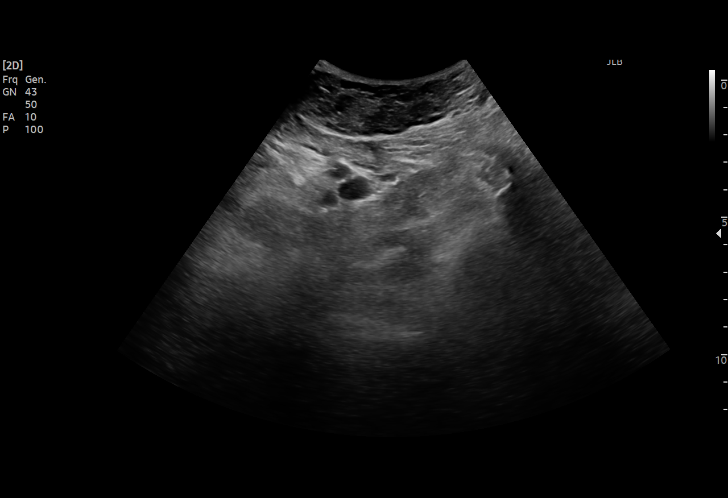
[im 15/16]
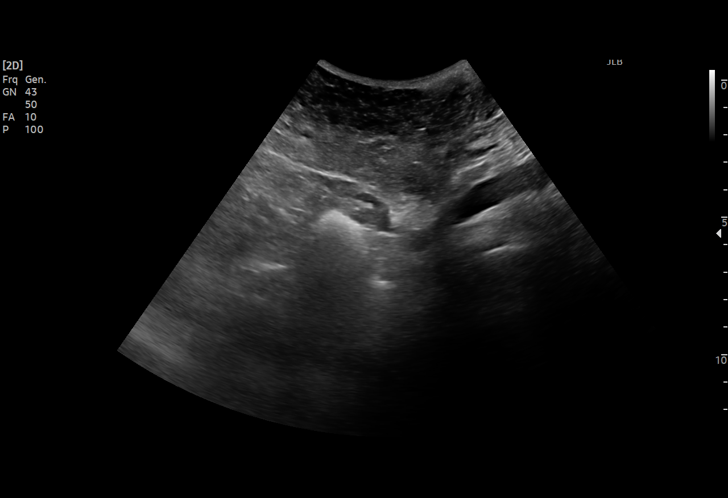
[im 16/16]
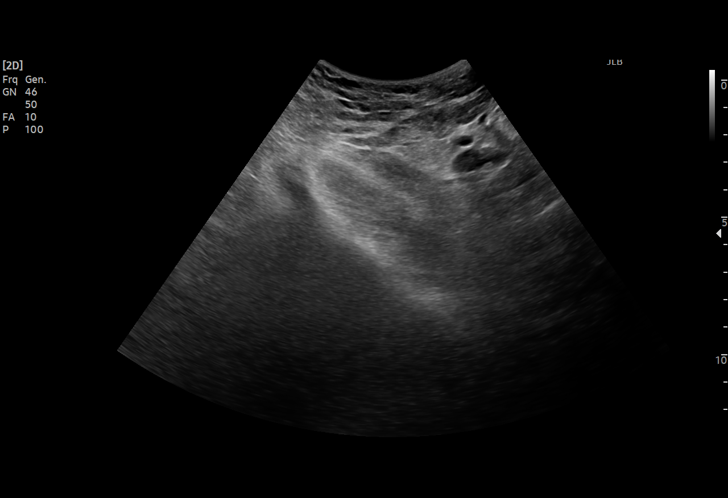

[15 of 16 positions shown; findings below may reference images not displayed]

FINDINGS: Uterus

Prior hysterectomy.  No abnormality about the vaginal cuff.

Endometrium

Surgically absent.

Right ovary

Not visualized.  No adnexal mass.

Left ovary

Not visualized.  No adnexal mass.

Other findings:  No abnormal free fluid.
IMPRESSION: 1. Prior hysterectomy with nonvisualization of either ovary. No
pelvic or adnexal mass or abnormal free fluid.
2. No other acute abnormality or findings to explain patient's
symptoms.

## 2022-12-10 ENCOUNTER — Other Ambulatory Visit: Payer: Self-pay

## 2022-12-10 ENCOUNTER — Emergency Department
Admission: EM | Admit: 2022-12-10 | Discharge: 2022-12-10 | Disposition: A | Discharge status: Home / Self Care | Payer: Medicare Other | Source: Home / Self Care | Attending: Emergency Medicine | Admitting: Emergency Medicine

## 2022-12-10 ENCOUNTER — Emergency Department: Payer: Medicare Other

## 2022-12-10 DIAGNOSIS — K5792 Diverticulitis of intestine, part unspecified, without perforation or abscess without bleeding: Secondary | ICD-10-CM | POA: Diagnosis not present

## 2022-12-10 DIAGNOSIS — K5732 Diverticulitis of large intestine without perforation or abscess without bleeding: Secondary | ICD-10-CM | POA: Insufficient documentation

## 2022-12-10 DIAGNOSIS — E119 Type 2 diabetes mellitus without complications: Secondary | ICD-10-CM | POA: Insufficient documentation

## 2022-12-10 LAB — URINALYSIS, ROUTINE W REFLEX MICROSCOPIC
Bacteria, UA: NONE SEEN
Bilirubin Urine: NEGATIVE
Glucose, UA: NEGATIVE mg/dL
Ketones, ur: NEGATIVE mg/dL
Nitrite: NEGATIVE
Protein, ur: 100 mg/dL — AB
Specific Gravity, Urine: 1.016 (ref 1.005–1.030)
pH: 6 (ref 5.0–8.0)

## 2022-12-10 LAB — COMPREHENSIVE METABOLIC PANEL
ALT: 12 U/L (ref 0–44)
AST: 15 U/L (ref 15–41)
Albumin: 3.8 g/dL (ref 3.5–5.0)
Alkaline Phosphatase: 49 U/L (ref 38–126)
Anion gap: 8 (ref 5–15)
BUN: 13 mg/dL (ref 8–23)
CO2: 23 mmol/L (ref 22–32)
Calcium: 8.6 mg/dL — ABNORMAL LOW (ref 8.9–10.3)
Chloride: 103 mmol/L (ref 98–111)
Creatinine, Ser: 0.6 mg/dL (ref 0.44–1.00)
GFR, Estimated: >60 mL/min (ref >60)
Glucose, Bld: 123 mg/dL — ABNORMAL HIGH (ref 70–99)
Potassium: 3.8 mmol/L (ref 3.5–5.1)
Sodium: 134 mmol/L — ABNORMAL LOW (ref 135–145)
Total Bilirubin: 0.7 mg/dL (ref 0.3–1.2)
Total Protein: 6.7 g/dL (ref 6.5–8.1)

## 2022-12-10 LAB — CBC
HCT: 50.7 % — ABNORMAL HIGH (ref 36.0–46.0)
Hemoglobin: 16.3 g/dL — ABNORMAL HIGH (ref 12.0–15.0)
MCH: 27.8 pg (ref 26.0–34.0)
MCHC: 32.1 g/dL (ref 30.0–36.0)
MCV: 86.5 fL (ref 80.0–100.0)
Platelets: 204 10*3/uL (ref 150–400)
RBC: 5.86 MIL/uL — ABNORMAL HIGH (ref 3.87–5.11)
RDW: 13.1 % (ref 11.5–15.5)
WBC: 11.7 10*3/uL — ABNORMAL HIGH (ref 4.0–10.5)
nRBC: 0 % (ref 0.0–0.2)

## 2022-12-10 LAB — LIPASE, BLOOD: Lipase: 32 U/L (ref 11–51)

## 2022-12-10 MED ORDER — CEPHALEXIN 500 MG PO CAPS: 500.0000 mg | ORAL_CAPSULE | Freq: Four times a day (QID) | ORAL | 0 refills | Status: DC

## 2022-12-10 MED ORDER — MORPHINE SULFATE (PF) 2 MG/ML IV SOLN
2.0000 mg | Freq: Once | INTRAVENOUS | Status: DC
  Filled 2022-12-10: qty 1

## 2022-12-10 MED ORDER — IOHEXOL 300 MG/ML  SOLN
65.0000 mL | Freq: Once | INTRAMUSCULAR | Status: AC | PRN
  Administered 2022-12-10: 65 mL via INTRAVENOUS

## 2022-12-10 MED ORDER — METRONIDAZOLE 500 MG PO TABS: 500.0000 mg | ORAL_TABLET | Freq: Three times a day (TID) | ORAL | 0 refills | Status: DC

## 2022-12-10 MED ORDER — ONDANSETRON HCL 4 MG/2ML IJ SOLN
4.0000 mg | Freq: Once | INTRAMUSCULAR | Status: AC
  Administered 2022-12-10: 4 mg via INTRAVENOUS
  Filled 2022-12-10: qty 2

## 2022-12-10 MED ORDER — SODIUM CHLORIDE 0.9 % IV BOLUS
1000.0000 mL | Freq: Once | INTRAVENOUS | Status: AC
  Administered 2022-12-10: 1000 mL via INTRAVENOUS

## 2022-12-10 NOTE — ED Provider Notes (Signed)
[facility] Provider Note    Event Date/Time   First MD Initiated Contact with Patient 12/10/22 1022     (approximate)   History   Abdominal Pain   HPI  Claire Washington is a 89 y.o. female with history of diabetes, recurrent diverticulitis presenting to the emergency department for evaluation of abdominal pain.  On 8/12, patient presented with left lower quadrant pain and was seen at a walk-in clinic and prescribed Augmentin.  Has previously had CT confirmed diverticulitis.  Since that time, she has continued to have abdominal pain with development of multiple episodes of nonbloody diarrhea.  More recently, her pain has extended across her lower abdomen.  No fevers or chills.  Does report nausea without significant vomiting.  In the setting of this, presents to the ER.  No chest pain or shortness of breath.     Physical Exam   Triage Vital Signs: ED Triage Vitals [12/10/22 1002]  Encounter Vitals Group     BP (!) 149/71     Systolic BP Percentile      Diastolic BP Percentile      Pulse Rate 85     Resp 20     Temp 98.4 F (36.9 C)     Temp Source Oral     SpO2      Weight      Height      Head Circumference      Peak Flow      Pain Score 5     Pain Loc      Pain Education      Exclude from Growth Chart     Most recent vital signs: Vitals:   12/10/22 1002  BP: (!) 149/71  Pulse: 85  Resp: 20  Temp: 98.4 F (36.9 C)     General: Awake, interactive  CV:  Regular rate, good peripheral perfusion.  Resp:  Lungs clear, unlabored respirations.  Abd:  Soft, nondistended, tenderness to palpation throughout the lower abdomen without rebound or guarding Neuro:  Symmetric facial movement, fluid speech   ED Results / Procedures / Treatments   Labs (all labs ordered are listed, but only abnormal results are displayed) Labs Reviewed  COMPREHENSIVE METABOLIC PANEL - Abnormal; Notable for the following components:      Result Value    Sodium 134 (*)    Glucose, Bld 123 (*)    Calcium 8.6 (*)    All other components within normal limits  CBC - Abnormal; Notable for the following components:   WBC 11.7 (*)    RBC 5.86 (*)    Hemoglobin 16.3 (*)    HCT 50.7 (*)    All other components within normal limits  URINALYSIS, ROUTINE W REFLEX MICROSCOPIC - Abnormal; Notable for the following components:   Color, Urine YELLOW (*)    APPearance CLEAR (*)    Hgb urine dipstick SMALL (*)    Protein, ur 100 (*)    Leukocytes,Ua TRACE (*)    All other components within normal limits  LIPASE, BLOOD     EKG EKG independently reviewed interpreted by myself (ER attending) demonstrates:    RADIOLOGY Imaging independently reviewed and interpreted by myself demonstrates:  CT without bowel obstruction, radiology notes mild diverticulitis  PROCEDURES:  Critical Care performed: No  Procedures   MEDICATIONS ORDERED IN ED: Medications  morphine (PF) 2 MG/ML injection 2 mg (2 mg Intravenous Not Given 12/10/22 1115)  ondansetron (ZOFRAN) injection 4 mg (4 mg Intravenous  Given 12/10/22 1114)  sodium chloride 0.9 % bolus 1,000 mL (1,000 mLs Intravenous New Bag/Given 12/10/22 1115)  iohexol (OMNIPAQUE) 300 MG/ML solution 65 mL (65 mLs Intravenous Contrast Given 12/10/22 1129)     IMPRESSION / MDM / ASSESSMENT AND PLAN / ED COURSE  I reviewed the triage vital signs and the nursing notes.  Differential diagnosis includes, but is not limited to, diverticulitis, potentially with complication including abscess, perforation, colitis, other acute intra-abdominal process, UTI  Patient's presentation is most consistent with acute presentation with potential threat to life or bodily function.  88 year old female presenting to the emergency department for ongoing abdominal pain.  Labs, CT ordered to further evaluate.  Symptomatic treatment with IV fluids, Zofran ordered.  Labs not severe derangement.  Slightly continues as needed.  Normal  lipase.  Urine not suggestive of infection.  CT abdomen pelvis with mild diverticulitis.  Patient reevaluated.  Reports improvement in her symptoms.  Update of the results of her workup.  Has completed recent antibiotic course with Augmentin and is concerned about ongoing symptoms.  Given incomplete resolution, will send her home with a short course of Keflex and Flagyl.  Offered nausea medicine, patient declines.  Strict return precautions provided.  Patient discharged stable condition.     FINAL CLINICAL IMPRESSION(S) / ED DIAGNOSES   Final diagnoses:  Acute diverticulitis     Rx / DC Orders   ED Discharge Orders          Ordered    cephALEXin (KEFLEX) 500 MG capsule  4 times daily        12/10/22 1307    metroNIDAZOLE (FLAGYL) 500 MG tablet  3 times daily        12/10/22 1307             Note:  This document was prepared using Dragon voice recognition software and may include unintentional dictation errors.   Trinna Post, MD 12/10/22 [phone]

## 2022-12-10 NOTE — ED Triage Notes (Signed)
Pt to ED via POV from home. Pt reports continued lower abdominal pain/pelvic pain. Pt reports dx with diverticulitis and UTI. Pt reports finished antibiotics on Thursday. Pt also reports N/D.

## 2022-12-10 NOTE — ED Notes (Signed)
Pt placed on monitor. Primary RN made aware

## 2022-12-10 NOTE — Discharge Instructions (Signed)
Your testing today showed uncomplicated diverticulitis.  I sent a prescription for antibiotics to your pharmacy.  Please take these as directed.  Follow-up your primary care actor for further evaluation.  Return to the ER for new or worsening symptoms.

## 2022-12-11 ENCOUNTER — Emergency Department: Payer: Medicare Other

## 2022-12-11 ENCOUNTER — Inpatient Hospital Stay
Admission: EM | Admit: 2022-12-11 | Discharge: 2022-12-15 | DRG: 392 | Disposition: A | Discharge status: Home Health | Payer: Medicare Other | Attending: Hospitalist | Admitting: Hospitalist

## 2022-12-11 ENCOUNTER — Other Ambulatory Visit: Payer: Self-pay

## 2022-12-11 DIAGNOSIS — Z90710 Acquired absence of both cervix and uterus: Secondary | ICD-10-CM | POA: Diagnosis not present

## 2022-12-11 DIAGNOSIS — I700 Atherosclerosis of aorta: Secondary | ICD-10-CM | POA: Diagnosis present

## 2022-12-11 DIAGNOSIS — Z1152 Encounter for screening for COVID-19: Secondary | ICD-10-CM | POA: Diagnosis not present

## 2022-12-11 DIAGNOSIS — K5732 Diverticulitis of large intestine without perforation or abscess without bleeding: Principal | ICD-10-CM | POA: Diagnosis present

## 2022-12-11 DIAGNOSIS — E876 Hypokalemia: Secondary | ICD-10-CM | POA: Diagnosis present

## 2022-12-11 DIAGNOSIS — Z86010 Personal history of colonic polyps: Secondary | ICD-10-CM

## 2022-12-11 DIAGNOSIS — E785 Hyperlipidemia, unspecified: Secondary | ICD-10-CM | POA: Diagnosis present

## 2022-12-11 DIAGNOSIS — R112 Nausea with vomiting, unspecified: Secondary | ICD-10-CM

## 2022-12-11 DIAGNOSIS — I10 Essential (primary) hypertension: Secondary | ICD-10-CM | POA: Diagnosis present

## 2022-12-11 DIAGNOSIS — Z87891 Personal history of nicotine dependence: Secondary | ICD-10-CM | POA: Diagnosis not present

## 2022-12-11 DIAGNOSIS — Z85828 Personal history of other malignant neoplasm of skin: Secondary | ICD-10-CM | POA: Diagnosis not present

## 2022-12-11 DIAGNOSIS — Z79899 Other long term (current) drug therapy: Secondary | ICD-10-CM | POA: Diagnosis not present

## 2022-12-11 DIAGNOSIS — K5900 Constipation, unspecified: Secondary | ICD-10-CM | POA: Diagnosis present

## 2022-12-11 DIAGNOSIS — Z8673 Personal history of transient ischemic attack (TIA), and cerebral infarction without residual deficits: Secondary | ICD-10-CM

## 2022-12-11 DIAGNOSIS — K5792 Diverticulitis of intestine, part unspecified, without perforation or abscess without bleeding: Principal | ICD-10-CM

## 2022-12-11 DIAGNOSIS — Z7902 Long term (current) use of antithrombotics/antiplatelets: Secondary | ICD-10-CM | POA: Diagnosis not present

## 2022-12-11 DIAGNOSIS — Z66 Do not resuscitate: Secondary | ICD-10-CM | POA: Diagnosis present

## 2022-12-11 DIAGNOSIS — Z91040 Latex allergy status: Secondary | ICD-10-CM

## 2022-12-11 DIAGNOSIS — Z8603 Personal history of neoplasm of uncertain behavior: Secondary | ICD-10-CM

## 2022-12-11 DIAGNOSIS — Z8249 Family history of ischemic heart disease and other diseases of the circulatory system: Secondary | ICD-10-CM

## 2022-12-11 DIAGNOSIS — R059 Cough, unspecified: Secondary | ICD-10-CM | POA: Diagnosis present

## 2022-12-11 DIAGNOSIS — Z90722 Acquired absence of ovaries, bilateral: Secondary | ICD-10-CM

## 2022-12-11 DIAGNOSIS — Z8719 Personal history of other diseases of the digestive system: Secondary | ICD-10-CM

## 2022-12-11 DIAGNOSIS — K8020 Calculus of gallbladder without cholecystitis without obstruction: Secondary | ICD-10-CM | POA: Diagnosis present

## 2022-12-11 DIAGNOSIS — M1990 Unspecified osteoarthritis, unspecified site: Secondary | ICD-10-CM | POA: Diagnosis present

## 2022-12-11 DIAGNOSIS — D72829 Elevated white blood cell count, unspecified: Secondary | ICD-10-CM | POA: Diagnosis present

## 2022-12-11 DIAGNOSIS — Z888 Allergy status to other drugs, medicaments and biological substances status: Secondary | ICD-10-CM

## 2022-12-11 DIAGNOSIS — A419 Sepsis, unspecified organism: Secondary | ICD-10-CM

## 2022-12-11 DIAGNOSIS — Z800 Family history of malignant neoplasm of digestive organs: Secondary | ICD-10-CM

## 2022-12-11 DIAGNOSIS — Z825 Family history of asthma and other chronic lower respiratory diseases: Secondary | ICD-10-CM

## 2022-12-11 DIAGNOSIS — Z823 Family history of stroke: Secondary | ICD-10-CM

## 2022-12-11 DIAGNOSIS — Z8042 Family history of malignant neoplasm of prostate: Secondary | ICD-10-CM

## 2022-12-11 DIAGNOSIS — M48061 Spinal stenosis, lumbar region without neurogenic claudication: Secondary | ICD-10-CM | POA: Diagnosis present

## 2022-12-11 DIAGNOSIS — Z8639 Personal history of other endocrine, nutritional and metabolic disease: Secondary | ICD-10-CM

## 2022-12-11 DIAGNOSIS — E119 Type 2 diabetes mellitus without complications: Secondary | ICD-10-CM | POA: Diagnosis present

## 2022-12-11 DIAGNOSIS — Z9889 Other specified postprocedural states: Secondary | ICD-10-CM

## 2022-12-11 DIAGNOSIS — Z91048 Other nonmedicinal substance allergy status: Secondary | ICD-10-CM

## 2022-12-11 DIAGNOSIS — Z882 Allergy status to sulfonamides status: Secondary | ICD-10-CM

## 2022-12-11 DIAGNOSIS — Z833 Family history of diabetes mellitus: Secondary | ICD-10-CM

## 2022-12-11 LAB — CBC WITH DIFFERENTIAL/PLATELET
Abs Immature Granulocytes: 0.05 10*3/uL (ref 0.00–0.07)
Basophils Absolute: 0 10*3/uL (ref 0.0–0.1)
Basophils Relative: 0 %
Eosinophils Absolute: 0 10*3/uL (ref 0.0–0.5)
Eosinophils Relative: 0 %
HCT: 42.5 % (ref 36.0–46.0)
Hemoglobin: 14.1 g/dL (ref 12.0–15.0)
Immature Granulocytes: 0 %
Lymphocytes Relative: 6 %
Lymphs Abs: 0.9 10*3/uL (ref 0.7–4.0)
MCH: 28.4 pg (ref 26.0–34.0)
MCHC: 33.2 g/dL (ref 30.0–36.0)
MCV: 85.5 fL (ref 80.0–100.0)
Monocytes Absolute: 0.8 10*3/uL (ref 0.1–1.0)
Monocytes Relative: 5 %
Neutro Abs: 12.2 10*3/uL — ABNORMAL HIGH (ref 1.7–7.7)
Neutrophils Relative %: 89 %
Platelets: 174 10*3/uL (ref 150–400)
RBC: 4.97 MIL/uL (ref 3.87–5.11)
RDW: 13.1 % (ref 11.5–15.5)
WBC: 13.9 10*3/uL — ABNORMAL HIGH (ref 4.0–10.5)
nRBC: 0 % (ref 0.0–0.2)

## 2022-12-11 LAB — COMPREHENSIVE METABOLIC PANEL
ALT: 10 U/L (ref 0–44)
AST: 13 U/L — ABNORMAL LOW (ref 15–41)
Albumin: 3.2 g/dL — ABNORMAL LOW (ref 3.5–5.0)
Alkaline Phosphatase: 45 U/L (ref 38–126)
Anion gap: 10 (ref 5–15)
BUN: 8 mg/dL (ref 8–23)
CO2: 24 mmol/L (ref 22–32)
Calcium: 7.9 mg/dL — ABNORMAL LOW (ref 8.9–10.3)
Chloride: 101 mmol/L (ref 98–111)
Creatinine, Ser: 0.59 mg/dL (ref 0.44–1.00)
GFR, Estimated: >60 mL/min (ref >60)
Glucose, Bld: 197 mg/dL — ABNORMAL HIGH (ref 70–99)
Potassium: 3.3 mmol/L — ABNORMAL LOW (ref 3.5–5.1)
Sodium: 135 mmol/L (ref 135–145)
Total Bilirubin: 1 mg/dL (ref 0.3–1.2)
Total Protein: 5.7 g/dL — ABNORMAL LOW (ref 6.5–8.1)

## 2022-12-11 LAB — TROPONIN I (HIGH SENSITIVITY)
Troponin I (High Sensitivity): 7 ng/L (ref <18)
Troponin I (High Sensitivity): 7 ng/L (ref <18)

## 2022-12-11 LAB — LACTIC ACID, PLASMA
Lactic Acid, Venous: 1.2 mmol/L (ref 0.5–1.9)
Lactic Acid, Venous: 1 mmol/L (ref 0.5–1.9)

## 2022-12-11 LAB — RESP PANEL BY RT-PCR (RSV, FLU A&B, COVID)  RVPGX2
Influenza A by PCR: NEGATIVE
Influenza B by PCR: NEGATIVE
Resp Syncytial Virus by PCR: NEGATIVE
SARS Coronavirus 2 by RT PCR: NEGATIVE

## 2022-12-11 MED ORDER — ONDANSETRON HCL 4 MG/2ML IJ SOLN
4.0000 mg | Freq: Once | INTRAMUSCULAR | Status: AC
  Administered 2022-12-11: 4 mg via INTRAVENOUS
  Filled 2022-12-11: qty 2

## 2022-12-11 MED ORDER — LACTATED RINGERS IV SOLN
150.0000 mL/h | INTRAVENOUS | Status: DC
  Administered 2022-12-12 (×2): 150 mL/h via INTRAVENOUS

## 2022-12-11 MED ORDER — SODIUM CHLORIDE 0.9 % IV SOLN: 2.0000 g | Freq: Once | INTRAVENOUS | Status: DC

## 2022-12-11 MED ORDER — ADULT MULTIVITAMIN W/MINERALS CH
1.0000 | ORAL_TABLET | Freq: Every day | ORAL | Status: DC
  Administered 2022-12-12 – 2022-12-15 (×4): 1 via ORAL
  Filled 2022-12-11 (×4): qty 1

## 2022-12-11 MED ORDER — CYANOCOBALAMIN 500 MCG PO TABS
500.0000 ug | ORAL_TABLET | Freq: Every day | ORAL | Status: DC
  Administered 2022-12-12 – 2022-12-15 (×4): 500 ug via ORAL
  Filled 2022-12-11 (×4): qty 1

## 2022-12-11 MED ORDER — TRAZODONE HCL 50 MG PO TABS: 25.0000 mg | ORAL_TABLET | Freq: Every evening | ORAL | Status: DC | PRN

## 2022-12-11 MED ORDER — AMLODIPINE BESYLATE 5 MG PO TABS: 5.0000 mg | ORAL_TABLET | Freq: Every evening | ORAL | Status: DC

## 2022-12-11 MED ORDER — ONDANSETRON HCL 4 MG PO TABS: 4.0000 mg | ORAL_TABLET | Freq: Four times a day (QID) | ORAL | Status: DC | PRN

## 2022-12-11 MED ORDER — MORPHINE SULFATE (PF) 2 MG/ML IV SOLN: 2.0000 mg | INTRAVENOUS | Status: DC | PRN

## 2022-12-11 MED ORDER — METRONIDAZOLE 500 MG/100ML IV SOLN: 500.0000 mg | Freq: Once | INTRAVENOUS | Status: DC

## 2022-12-11 MED ORDER — HYDRALAZINE HCL 50 MG PO TABS
25.0000 mg | ORAL_TABLET | Freq: Three times a day (TID) | ORAL | Status: DC | PRN
  Administered 2022-12-15: 25 mg via ORAL
  Filled 2022-12-11: qty 1

## 2022-12-11 MED ORDER — ENOXAPARIN SODIUM 30 MG/0.3ML IJ SOSY
30.0000 mg | PREFILLED_SYRINGE | INTRAMUSCULAR | Status: DC
  Administered 2022-12-11 – 2022-12-14 (×4): 30 mg via SUBCUTANEOUS
  Filled 2022-12-11 (×5): qty 0.3

## 2022-12-11 MED ORDER — SODIUM CHLORIDE 0.9 % IV SOLN
2.0000 g | INTRAVENOUS | Status: DC
  Administered 2022-12-11: 2 g via INTRAVENOUS
  Filled 2022-12-11: qty 20

## 2022-12-11 MED ORDER — ACETAMINOPHEN 650 MG RE SUPP: 650.0000 mg | Freq: Four times a day (QID) | RECTAL | Status: DC | PRN

## 2022-12-11 MED ORDER — LACTATED RINGERS IV BOLUS
1000.0000 mL | Freq: Once | INTRAVENOUS | Status: AC
  Administered 2022-12-11: 1000 mL via INTRAVENOUS

## 2022-12-11 MED ORDER — METRONIDAZOLE 500 MG/100ML IV SOLN
500.0000 mg | Freq: Two times a day (BID) | INTRAVENOUS | Status: DC
  Administered 2022-12-11: 500 mg via INTRAVENOUS
  Filled 2022-12-11 (×2): qty 100

## 2022-12-11 MED ORDER — PANTOPRAZOLE SODIUM 40 MG IV SOLR
40.0000 mg | Freq: Once | INTRAVENOUS | Status: AC
  Administered 2022-12-11: 40 mg via INTRAVENOUS
  Filled 2022-12-11: qty 10

## 2022-12-11 MED ORDER — ALBUTEROL SULFATE (2.5 MG/3ML) 0.083% IN NEBU: 2.5000 mg | INHALATION_SOLUTION | Freq: Four times a day (QID) | RESPIRATORY_TRACT | Status: DC | PRN

## 2022-12-11 MED ORDER — ACETAMINOPHEN 325 MG PO TABS: 650.0000 mg | ORAL_TABLET | Freq: Four times a day (QID) | ORAL | Status: DC | PRN

## 2022-12-11 MED ORDER — ONDANSETRON HCL 4 MG/2ML IJ SOLN: 4.0000 mg | Freq: Four times a day (QID) | INTRAMUSCULAR | Status: DC | PRN

## 2022-12-11 NOTE — ED Provider Notes (Signed)
[facility] Provider Note    Event Date/Time   First MD Initiated Contact with Patient 12/11/22 2100     (approximate)   History   Chief Complaint: GI Problem   HPI  Claire Washington is a 89 y.o. female with a past history of hypertension, diabetes who comes ED complaining of generalized abdominal pain nausea and vomiting for the last 24 hours.  Was seen in the ED yesterday, diagnosed with early diverticulitis based on CT scan, discharged home on antibiotics.  Unfortunately, condition has continued to worsen and patient has not been able to tolerate oral intake at home.  Also reports worsening generalized weakness and cough.     Physical Exam   Triage Vital Signs: ED Triage Vitals  Encounter Vitals Group     BP 12/11/22 2107 (!) 141/53     Systolic BP Percentile --      Diastolic BP Percentile --      Pulse Rate 12/11/22 2107 90     Resp 12/11/22 2107 (!) 26     Temp 12/11/22 2110 98.2 F (36.8 C)     Temp Source 12/11/22 2110 Oral     SpO2 12/11/22 2107 90 %     Weight 12/11/22 2057 119 lb 11.4 oz (54.3 kg)     Height 12/11/22 2057 5' (1.524 m)     Head Circumference --      Peak Flow --      Pain Score 12/11/22 2056 0     Pain Loc --      Pain Education --      Exclude from Growth Chart --     Most recent vital signs: Vitals:   12/11/22 2110 12/11/22 2110  BP:    Pulse:    Resp:    Temp:  98.2 F (36.8 C)  SpO2: 96%     General: Awake, no distress.  CV:  Good peripheral perfusion.  Regular rate rhythm Resp:  Normal effort.  Clear to auscultation bilaterally, without crackles Abd:  No distention.  Soft with diffuse lower abdominal tenderness Other:  Dry oral mucosa   ED Results / Procedures / Treatments   Labs (all labs ordered are listed, but only abnormal results are displayed) Labs Reviewed  CBC WITH DIFFERENTIAL/PLATELET - Abnormal; Notable for the following components:      Result Value   WBC 13.9 (*)    Neutro  Abs 12.2 (*)    All other components within normal limits  COMPREHENSIVE METABOLIC PANEL - Abnormal; Notable for the following components:   Potassium 3.3 (*)    Glucose, Bld 197 (*)    Calcium 7.9 (*)    Total Protein 5.7 (*)    Albumin 3.2 (*)    AST 13 (*)    All other components within normal limits  CULTURE, BLOOD (ROUTINE X 2)  CULTURE, BLOOD (ROUTINE X 2)  RESP PANEL BY RT-PCR (RSV, FLU A&B, COVID)  [id number]  LACTIC ACID, PLASMA  LACTIC ACID, PLASMA  URINALYSIS, ROUTINE W REFLEX MICROSCOPIC  TROPONIN I (HIGH SENSITIVITY)  TROPONIN I (HIGH SENSITIVITY)     EKG    RADIOLOGY Chest x-ray interpreted by me, appears normal.  Radiology report reviewed   PROCEDURES:  Procedures   MEDICATIONS ORDERED IN ED: Medications  cefTRIAXone (ROCEPHIN) 2 g in sodium chloride 0.9 % 100 mL IVPB (has no administration in time range)  metroNIDAZOLE (FLAGYL) IVPB 500 mg (has no administration in time range)  lactated ringers bolus 1,000  mL (1,000 mLs Intravenous New Bag/Given 12/11/22 2208)  pantoprazole (PROTONIX) injection 40 mg (40 mg Intravenous Given 12/11/22 2208)  ondansetron (ZOFRAN) injection 4 mg (4 mg Intravenous Given 12/11/22 2208)     IMPRESSION / MDM / ASSESSMENT AND PLAN / ED COURSE  I reviewed the triage vital signs and the nursing notes.  DDx: Electrolyte abnormality, diverticulitis, AKI, UTI, COVID, influenza, dehydration  Patient's presentation is most consistent with acute presentation with potential threat to life or bodily function.  Elderly patient returns to the ED due to vomiting and inability to take her oral medication after starting treatment for diverticulitis yesterday.  She has a low bit of a leukocytosis.  Lactate is normal.  Will give Rocephin and Flagyl IV, IV fluids, plan to admit.  ----------------------------------------- 10:50 PM on 12/11/2022 ----------------------------------------- Case discussed with hospitalist.       FINAL CLINICAL  IMPRESSION(S) / ED DIAGNOSES   Final diagnoses:  Diverticulitis  Nausea and vomiting, unspecified vomiting type     Rx / DC Orders   ED Discharge Orders     None        Note:  This document was prepared using Dragon voice recognition software and may include unintentional dictation errors.   Sharman Cheek, MD 12/11/22 2251

## 2022-12-11 NOTE — Progress Notes (Signed)
Anticoagulation monitoring(Lovenox):  89 yo  female ordered Lovenox 40 mg Q24h    Filed Weights   12/11/22 2057  Weight: 54.3 kg (119 lb 11.4 oz)   BMI 23.4   Lab Results  Component Value Date   CREATININE 0.59 12/11/2022   CREATININE 0.60 12/10/2022   CREATININE 0.67 01/17/2022   Estimated Creatinine Clearance: 29.5 mL/min (by C-G formula based on SCr of 0.59 mg/dL). Hemoglobin & Hematocrit     Component Value Date/Time   HGB 14.1 12/11/2022 2128   HGB 15.2 07/26/2013 1025   HCT 42.5 12/11/2022 2128   HCT 47.7 (H) 07/26/2013 1025     Per Protocol for Patient with estCrcl < 30 ml/min and BMI < 30, will transition to Lovenox 30 mg Q24h.

## 2022-12-11 NOTE — ED Triage Notes (Addendum)
Pt arrives via [facility] from home with CC of N/V r/t diverticulitis dx yesterday. EMS started 18g IV in R AC an gave 4mg  Zofran and some fluid. Pt appears weak but is alert and oriented at this time. EMS reports concern for aspiration r/t vomiting - SpO2 90% RA. Pt placed on 2L [place] and SpO2 improved to 96%.

## 2022-12-11 NOTE — H&P (Signed)
[place]   PATIENT NAME: Claire Washington    MR#:  [id number]  DATE OF BIRTH:  01/27/1925  DATE OF ADMISSION:  12/11/2022  PRIMARY CARE PHYSICIAN: Barbette Reichmann, MD   Patient is coming from: Home  REQUESTING/REFERRING PHYSICIAN: Alfonse Flavors, MD  CHIEF COMPLAINT:   Chief Complaint  Patient presents with   GI Problem    HISTORY OF PRESENT ILLNESS:  Claire Washington is a 89 y.o. Caucasian female with medical history significant for type II diabetes mellitus, hypertension, dyslipidemia, osteoarthritis, who presented to the emergency room with acute onset of worsening abdominal pain that was initially in the left lower quadrant and became generalized.  She was seen on an outpatient basis on 8/11 and managed outpatient for diverticulitis.  Due to persistent symptoms she came to the ER yesterday and was given Keflex and Flagyl for acute diverticulitis on abdominal and pelvic CT scan.  She started having vomiting today and felt significantly weak with worsening abdominal pain.  She denies any chest pain or palpitations.  No cough or wheezing or dyspnea.  No dysuria, oliguria or hematuria or flank pain.  ED Course: When she came to the ER, BP was 141/53 with respiratory rate of 26 and heart rate 91.  Labs revealed mild hypokalemia of 3.3 and a blood Kos 197 with albumin 3.2 and total protein 5.7.  High sensitive troponin I was 7 twice and lactic acid 1.2 then 1.  CBC showed leukocytosis 13.9 with neutrophilia.  Respiratory panel with COVID-19, RSV and influenza PCR's came back negative.  She had a negative UA yesterday.  Blood cultures were drawn tonight. EKG as reviewed by me : None Imaging: Portable chest x-ray showed no acute cardiopulmonary disease.  Abdominal and pelvic CT scan yesterday revealed sigmoid colon subtle acute uncomplicated-colitis with cholelithiasis and aortic atherosclerosis.  The patient was given IV Rocephin and Flagyl, 1 L bolus of IV lactated Ringer, 40  mg of IV Protonix and 4 mg of IV Zofran.  She will be admitted to a medical telemetry bed for further evaluation and management. PAST MEDICAL HISTORY:   Past Medical History:  Diagnosis Date   Arthritis    Basal cell carcinoma    basel cell   Chronic cystitis    Diabetes mellitus type 2, controlled, without complications (HCC)    type 2 or unspecified type diabetes mellitus without mention of complication, not stated as uncontrolled   Disease of thyroid gland    Diverticulitis    Embolic stroke (HCC)    a. 07/2016 MRI Brain: small right superior cerebellum and left lateral occipital cortex infarcts;  b. 08/2016 Echo: EF 60-65%, Gr1 DD, mild MR;  c. 08/2016 30 Day Event Monitor: No significant tachy/bradyarrhythmias. No afib.   Follicular neoplasm of thyroid    Hemorrhage of rectum and anus    Hyperlipidemia    Hyperlipidemia, unspecified    Hypertension    Leg pain    Osteoarthritis    Pernicious anemia    Small vessel disease (HCC)    Spinal stenosis, lumbar region without neurogenic claudication    Subdural hematoma (HCC)    a. 2013 in setting of syncope.   Syncope and collapse    a. 2013 - syncope and fall w/ resultant subdural hematoma; b. prev nl echo and stress test @ [facility].   Thyroid nodule    Vertigo & Chronic Dizziness    a. Previously seen by ENT with Physical Therapy.  MRI 2000 small  vessel disease    PAST SURGICAL HISTORY:   Past Surgical History:  Procedure Laterality Date   BILATERAL SALPINGOOPHORECTOMY     BREAST CYST EXCISION     ? side, yrs ago   COLONOSCOPY  01/26/2009   Diverticulosis   CYSTOSCOPY     HEMORRHOIDECTOMY BY SIMPLE LIGATION     PR COLONOSCOPY FLX W/ENDOSCOPIC MUCOSAL RESECTION  09/02/2014   Procedure: COLONOSCOPY, FLEXIBLE; WITH ENDOSCOPIC MUCOSAL RESECTION; Surgeon: Mayford Knife, MD; [facility]; Service: Gastroenterology   PR COLSC FLX W/RMVL OF TUMOR POLYP LESION SNARE TQ  09/02/2014   Procedure:  COLONOSCOPY FLEX; W/REMOV TUMOR/LES BY SNARE; Surgeon: Mayford Knife, MD; Location[facility]; Service: Gastroenterology   SKIN SURGERY     TOTAL ABDOMINAL HYSTERECTOMY W/ BILATERAL SALPINGOOPHORECTOMY  1970s    SOCIAL HISTORY:   Social History   Tobacco Use   Smoking status: Former    Current packs/day: 0.00    Average packs/day: 0.3 packs/day for 20.0 years (5.0 ttl pk-yrs)    Types: Cigarettes    Start date: 09/15/1973    Quit date: 09/15/1993    Years since quitting: 29.2   Smokeless tobacco: Never  Substance Use Topics   Alcohol use: Yes    Alcohol/week: 2.0 standard drinks of alcohol    Types: 1 Glasses of wine, 1 Shots of liquor per week    Comment: occassional    FAMILY HISTORY:   Family History  Problem Relation Age of Onset   Colon cancer Mother    Heart disease Father    Heart attack Father    Prostate cancer Father    Stroke Father    Heart attack Brother    Asthma Daughter    Diabetes Other        Siblings   Breast cancer Neg Hx     DRUG ALLERGIES:   Allergies  Allergen Reactions   Latex Dermatitis   Hydrocortisone Swelling   Sulfa Antibiotics Other (See Comments)    Reaction: unknown Other reaction(s): Not available   Tape Dermatitis    REVIEW OF SYSTEMS:   ROS As per history of present illness. All pertinent systems were reviewed above. Constitutional, HEENT, cardiovascular, respiratory, GI, GU, musculoskeletal, neuro, psychiatric, endocrine, integumentary and hematologic systems were reviewed and are otherwise negative/unremarkable except for positive findings mentioned above in the HPI.   MEDICATIONS AT HOME:   Prior to Admission medications   Medication Sig Start Date End Date Taking? Authorizing Provider  acetaminophen (TYLENOL) 325 MG tablet Take 2 tablets (650 mg total) by mouth every 6 (six) hours as needed for mild pain, moderate pain, fever or headache (or Fever >/= 101). 01/18/22   Pennie Banter, DO  albuterol  (VENTOLIN HFA) 108 (90 Base) MCG/ACT inhaler Inhale 2 puffs into the lungs every 6 (six) hours as needed for wheezing or shortness of breath. 01/18/22   Esaw Grandchild A, DO  amLODipine (NORVASC) 2.5 MG tablet Take 1 tablet (2.5 mg total) by mouth every evening. 06/27/22 09/25/22  Antonieta Iba, MD  amLODipine (NORVASC) 5 MG tablet Take 5 mg by mouth every evening.    [provider]  cephALEXin (KEFLEX) 500 MG capsule Take 1 capsule (500 mg total) by mouth 4 (four) times daily for 5 days. 12/10/22 12/15/22  Trinna Post, MD  clopidogrel (PLAVIX) 75 MG tablet TAKE 1 TABLET BY MOUTH EVERY DAY 08/11/22   Antonieta Iba, MD  hydrALAZINE (APRESOLINE) 25 MG tablet TAKE 1 TABLET  BY MOUTH 3 TIMES DAILY AS NEEDED FOR SYSTOLIC BLOOD PRESSURE >160 11/29/21   Antonieta Iba, MD  metroNIDAZOLE (FLAGYL) 500 MG tablet Take 1 tablet (500 mg total) by mouth 3 (three) times daily for 5 days. 12/10/22 12/15/22  Trinna Post, MD  Multiple Vitamins-Minerals (CENTRUM SILVER PO) Take 1 tablet by mouth daily. Reported on 06/11/2015    [provider]  senna (SENOKOT) 8.6 MG TABS tablet Take 1 tablet (8.6 mg total) by mouth daily. Hold if having loose or frequent stools. 01/19/22   Pennie Banter, DO  vitamin B-12 (CYANOCOBALAMIN) 1000 MCG tablet Take 500 mcg by mouth daily.    [provider]  Vitamin D, Ergocalciferol, (DRISDOL) 1.25 MG (50000 UT) CAPS capsule Take 50,000 Units by mouth every 7 (seven) days.    [provider]      VITAL SIGNS:  Blood pressure (!) 131/54, pulse 85, temperature (!) 97.5 F (36.4 C), temperature source Oral, resp. rate (!) 22, height 5' (1.524 m), weight 54.3 kg, SpO2 96%.  PHYSICAL EXAMINATION:  Physical Exam  GENERAL:  89 y.o.-year-old Caucasian female patient lying in the bed with no acute distress.  EYES: Pupils equal, round, reactive to light and accommodation. No scleral icterus. Extraocular muscles intact.  HEENT: Head atraumatic,  normocephalic. Oropharynx and nasopharynx clear.  NECK:  Supple, no jugular venous distention. No thyroid enlargement, no tenderness.  LUNGS: Normal breath sounds bilaterally, no wheezing, rales,rhonchi or crepitation. No use of accessory muscles of respiration.  CARDIOVASCULAR: Regular rate and rhythm, S1, S2 normal. No murmurs, rubs, or gallops.  ABDOMEN: Soft, nondistended, with left lower quadrant tenderness without rebound tenderness guarding or rigidity.  Bowel sounds present. No organomegaly or mass.  EXTREMITIES: No pedal edema, cyanosis, or clubbing.  NEUROLOGIC: Cranial nerves II through XII are intact. Muscle strength 5/5 in all extremities. Sensation intact. Gait not checked.  PSYCHIATRIC: The patient is alert and oriented x 3.  Normal affect and good eye contact. SKIN: No obvious rash, lesion, or ulcer.   LABORATORY PANEL:   CBC Recent Labs  Lab 12/11/22 2128  WBC 13.9*  HGB 14.1  HCT 42.5  PLT 174   ------------------------------------------------------------------------------------------------------------------  Chemistries  Recent Labs  Lab 12/11/22 2128  NA 135  K 3.3*  CL 101  CO2 24  GLUCOSE 197*  BUN 8  CREATININE 0.59  CALCIUM 7.9*  AST 13*  ALT 10  ALKPHOS 45  BILITOT 1.0   ------------------------------------------------------------------------------------------------------------------  Cardiac Enzymes No results for input(s): "TROPONINI" in the last 168 hours. ------------------------------------------------------------------------------------------------------------------  RADIOLOGY:  DG Chest Portable 1 View  Result Date: 12/11/2022 CLINICAL DATA:  Shortness of breath and vomiting EXAM: PORTABLE CHEST 1 VIEW COMPARISON:  09/29/2020 FINDINGS: Cardiac shadow is stable. Aortic calcifications are again seen. The lungs are well aerated bilaterally. Previously seen right lung nodule is again seen and stable. No new focal abnormality is noted. No  bony abnormality is seen. IMPRESSION: No acute abnormality noted. Electronically Signed   By: Alcide Clever M.D.   On: 12/11/2022 21:58   CT ABDOMEN PELVIS W CONTRAST  Result Date: 12/10/2022 CLINICAL DATA:  Left lower quadrant abdominal pain. EXAM: CT ABDOMEN AND PELVIS WITH CONTRAST TECHNIQUE: Multidetector CT imaging of the abdomen and pelvis was performed using the standard protocol following bolus administration of intravenous contrast. RADIATION DOSE REDUCTION: This exam was performed according to the departmental dose-optimization program which includes automated exposure control, adjustment of the mA and/or kV according to patient size and/or use of iterative  reconstruction technique. CONTRAST:  65mL OMNIPAQUE IOHEXOL 300 MG/ML  SOLN COMPARISON:  CT abdomen and pelvis 01/13/2022 FINDINGS: Lower chest: Mild chronic subpleural reticulation in the lung bases. No acute basilar lung consolidation or pleural effusion. Hepatobiliary: No focal liver abnormality. Persistent 1.9 cm stone in the gallbladder. No acute pericholecystic inflammation or biliary dilatation. Pancreas: Unremarkable. Spleen: Unremarkable. Adrenals/Urinary Tract: Unremarkable adrenal glands. No renal calculi or hydronephrosis. Similar appearance of subcentimeter hypodense lesions in the kidneys, too small to fully characterize and with no follow-up imaging recommended. Unremarkable bladder. Stomach/Bowel: The stomach is unremarkable. There is no evidence of bowel obstruction. There is prominent diverticulosis of the sigmoid colon with question of subtle acute inflammation (for example series 6, image 73). No extraluminal gas or fluid collection is identified. Vascular/Lymphatic: Extensive abdominal aortic atherosclerosis without aneurysm. No enlarged lymph nodes. Reproductive: Status post hysterectomy. No adnexal masses. Other: No ascites or pneumoperitoneum. Musculoskeletal: Diffuse osteopenia. Levoscoliosis and advanced disc and facet  degeneration in the lumbar spine. IMPRESSION: 1. Sigmoid colon diverticulosis with possible subtle findings of acute uncomplicated diverticulitis. 2. Cholelithiasis. 3.  Aortic Atherosclerosis (ICD10-I70.0). Electronically Signed   By: Sebastian Ache M.D.   On: 12/10/2022 12:23      IMPRESSION AND PLAN:  Assessment and Plan: * Acute diverticulitis - She is being admitted due to failure of outpatient antibiotic therapy for her sigmoid acute diverticulitis. - She will be admitted to medical telemetry bed. - We will continue antibiotic therapy with IV Rocephin and Flagyl. - Pain management will be provided. - Will be hydrated with IV normal saline. - We will hold off Plavix given high risk of bleeding.  Sepsis due to undetermined organism [facility]) - This is clearly secondary to #1. - This is manifested by leukocytosis, tachycardia and tachypnea. - Management as above. - We will follow blood cultures.  Type 2 diabetes mellitus without complications (HCC) - The will be placed on supplemental coverage with NovoLog.  Essential hypertension - We will continue her antihypertensive therapy.   DVT prophylaxis: Lovenox. Advanced Care Planning:  Code Status: The patient is DNR and DNI. Family Communication:  The plan of care was discussed in details with the patient (and family). I answered all questions. The patient agreed to proceed with the above mentioned plan. Further management will depend upon hospital course. Disposition Plan: Back to previous home environment Consults called: none. All the records are reviewed and case discussed with ED provider.  Status is: Inpatient    At the time of the admission, it appears that the appropriate admission status for this patient is inpatient.  This is judged to be reasonable and necessary in order to provide the required intensity of service to ensure the patient's safety given the presenting symptoms, physical exam findings and initial radiographic  and laboratory data in the context of comorbid conditions.  The patient requires inpatient status due to high intensity of service, high risk of further deterioration and high frequency of surveillance required.  I certify that at the time of admission, it is my clinical judgment that the patient will require inpatient hospital care extending more than 2 midnights.                            Dispo: The patient is from: Home              Anticipated d/c is to: Home              Patient  currently is not medically stable to d/c.              Difficult to place patient: No  Hannah Beat M.D on 12/12/2022 at 12:41 AM  [facility]   From 7 PM-7 AM, contact night-coverage [email or web address]  CC: Primary care physician; Barbette Reichmann, MD

## 2022-12-12 DIAGNOSIS — A419 Sepsis, unspecified organism: Secondary | ICD-10-CM

## 2022-12-12 DIAGNOSIS — E119 Type 2 diabetes mellitus without complications: Secondary | ICD-10-CM

## 2022-12-12 DIAGNOSIS — K5792 Diverticulitis of intestine, part unspecified, without perforation or abscess without bleeding: Secondary | ICD-10-CM | POA: Diagnosis not present

## 2022-12-12 DIAGNOSIS — I10 Essential (primary) hypertension: Secondary | ICD-10-CM | POA: Diagnosis not present

## 2022-12-12 LAB — URINALYSIS, ROUTINE W REFLEX MICROSCOPIC
Bilirubin Urine: NEGATIVE
Glucose, UA: NEGATIVE mg/dL
Hgb urine dipstick: NEGATIVE
Ketones, ur: NEGATIVE mg/dL
Leukocytes,Ua: NEGATIVE
Nitrite: NEGATIVE
Protein, ur: NEGATIVE mg/dL
Specific Gravity, Urine: 1.003 — ABNORMAL LOW (ref 1.005–1.030)
pH: 7 (ref 5.0–8.0)

## 2022-12-12 LAB — BASIC METABOLIC PANEL
Anion gap: 4 — ABNORMAL LOW (ref 5–15)
BUN: 7 mg/dL — ABNORMAL LOW (ref 8–23)
CO2: 28 mmol/L (ref 22–32)
Calcium: 7.8 mg/dL — ABNORMAL LOW (ref 8.9–10.3)
Chloride: 107 mmol/L (ref 98–111)
Creatinine, Ser: 0.54 mg/dL (ref 0.44–1.00)
GFR, Estimated: >60 mL/min (ref >60)
Glucose, Bld: 103 mg/dL — ABNORMAL HIGH (ref 70–99)
Potassium: 3.5 mmol/L (ref 3.5–5.1)
Sodium: 139 mmol/L (ref 135–145)

## 2022-12-12 LAB — CBC
HCT: 38.4 % (ref 36.0–46.0)
Hemoglobin: 12.9 g/dL (ref 12.0–15.0)
MCH: 28.5 pg (ref 26.0–34.0)
MCHC: 33.6 g/dL (ref 30.0–36.0)
MCV: 84.8 fL (ref 80.0–100.0)
Platelets: 162 10*3/uL (ref 150–400)
RBC: 4.53 MIL/uL (ref 3.87–5.11)
RDW: 13.1 % (ref 11.5–15.5)
WBC: 9.2 10*3/uL (ref 4.0–10.5)
nRBC: 0 % (ref 0.0–0.2)

## 2022-12-12 LAB — PROCALCITONIN: Procalcitonin: 0.69 ng/mL

## 2022-12-12 LAB — PROTIME-INR
INR: 1.2 (ref 0.8–1.2)
Prothrombin Time: 15.7 seconds — ABNORMAL HIGH (ref 11.4–15.2)

## 2022-12-12 LAB — CORTISOL-AM, BLOOD: Cortisol - AM: 6.2 ug/dL — ABNORMAL LOW (ref 6.7–22.6)

## 2022-12-12 MED ORDER — AMLODIPINE BESYLATE 5 MG PO TABS
5.0000 mg | ORAL_TABLET | Freq: Every day | ORAL | Status: DC
  Administered 2022-12-12 – 2022-12-14 (×3): 5 mg via ORAL
  Filled 2022-12-12 (×3): qty 1

## 2022-12-12 MED ORDER — LACTATED RINGERS IV SOLN: INTRAVENOUS | Status: AC

## 2022-12-12 MED ORDER — LACTATED RINGERS IV SOLN: Freq: Once | INTRAVENOUS | Status: AC

## 2022-12-12 MED ORDER — CLOPIDOGREL BISULFATE 75 MG PO TABS
75.0000 mg | ORAL_TABLET | Freq: Every day | ORAL | Status: DC
  Administered 2022-12-12 – 2022-12-14 (×3): 75 mg via ORAL
  Filled 2022-12-12 (×3): qty 1

## 2022-12-12 MED ORDER — TRAMADOL HCL 50 MG PO TABS: 50.0000 mg | ORAL_TABLET | Freq: Four times a day (QID) | ORAL | Status: DC | PRN

## 2022-12-12 MED ORDER — MORPHINE SULFATE (PF) 2 MG/ML IV SOLN: 1.0000 mg | Freq: Four times a day (QID) | INTRAVENOUS | Status: DC | PRN

## 2022-12-12 MED ORDER — ENSURE ENLIVE PO LIQD
237.0000 mL | Freq: Two times a day (BID) | ORAL | Status: DC
  Administered 2022-12-12 – 2022-12-15 (×6): 237 mL via ORAL

## 2022-12-12 MED ORDER — CLOPIDOGREL BISULFATE 75 MG PO TABS
75.0000 mg | ORAL_TABLET | Freq: Every day | ORAL | Status: DC
  Filled 2022-12-12: qty 1

## 2022-12-12 MED ORDER — SENNA 8.6 MG PO TABS
1.0000 | ORAL_TABLET | Freq: Every day | ORAL | Status: DC
  Administered 2022-12-12: 8.6 mg via ORAL
  Filled 2022-12-12: qty 1

## 2022-12-12 MED ORDER — PIPERACILLIN-TAZOBACTAM 3.375 G IVPB
3.3750 g | Freq: Three times a day (TID) | INTRAVENOUS | Status: DC
  Administered 2022-12-12 – 2022-12-15 (×10): 3.375 g via INTRAVENOUS
  Filled 2022-12-12 (×9): qty 50

## 2022-12-12 NOTE — Assessment & Plan Note (Signed)
-   This is clearly secondary to #1. - This is manifested by leukocytosis, tachycardia and tachypnea. - Management as above. - We will follow blood cultures.

## 2022-12-12 NOTE — Progress Notes (Signed)
[facility]  - [place] at [facility]   PATIENT NAME: Claire Washington    MR#:  [id number]  DATE OF BIRTH:  August 01, 1924  SUBJECTIVE:  no family at bedside. Patient came in with ongoing issues with abdominal pain more so pain in the lower abdomen. Patient tells her belly feels bloated. She finished two rounds of antibiotic forest with 10 day of Augmentin and second Keflex with Flagyl for five days for recurrent diverticulitis. Poor appetite last several days. Patient is otherwise fairly active at home.   VITALS:  Blood pressure (!) 128/57, pulse 72, temperature 98 F (36.7 C), temperature source Oral, resp. rate 17, height 5' (1.524 m), weight 54.3 kg, SpO2 94%.  PHYSICAL EXAMINATION:   GENERAL:  89 y.o.-year-old patient with no acute distress.  LUNGS: Normal breath sounds bilaterally, no wheezing CARDIOVASCULAR: S1, S2 normal. No murmur   ABDOMEN: Soft, diffusely tender lower abdomen, nondistended. Bowel sounds present.  EXTREMITIES: No  edema b/l.    NEUROLOGIC: nonfocal  patient is alert and awake  LABORATORY PANEL:  CBC Recent Labs  Lab 12/12/22 0536  WBC 9.2  HGB 12.9  HCT 38.4  PLT 162    Chemistries  Recent Labs  Lab 12/11/22 2128 12/12/22 0536  NA 135 139  K 3.3* 3.5  CL 101 107  CO2 24 28  GLUCOSE 197* 103*  BUN 8 7*  CREATININE 0.59 0.54  CALCIUM 7.9* 7.8*  AST 13*  --   ALT 10  --   ALKPHOS 45  --   BILITOT 1.0  --    Cardiac Enzymes No results for input(s): "TROPONINI" in the last 168 hours. RADIOLOGY:  DG Chest Portable 1 View  Result Date: 12/11/2022 CLINICAL DATA:  Shortness of breath and vomiting EXAM: PORTABLE CHEST 1 VIEW COMPARISON:  09/29/2020 FINDINGS: Cardiac shadow is stable. Aortic calcifications are again seen. The lungs are well aerated bilaterally. Previously seen right lung nodule is again seen and stable. No new focal abnormality is noted. No bony abnormality is seen. IMPRESSION: No acute abnormality noted.  Electronically Signed   By: Claire Washington M.D.   On: 12/11/2022 21:58    Assessment and Plan Claire Washington is a 89 y.o. Caucasian female with medical history significant for type II diabetes mellitus, hypertension, dyslipidemia, osteoarthritis, who presented to the emergency room with acute onset of worsening abdominal pain that was initially in the left lower quadrant and became generalized.  She was seen on an outpatient basis on 8/11 and managed outpatient for diverticulitis with Augmentin for 10 days .  Due to persistent symptoms she came to the ER yesterday and was given Keflex and Flagyl for acute diverticulitis on abdominal and pelvic CT scan.  She started having vomiting today and felt significantly weak with worsening abdominal pain.   CT scan abdomen revealed sigmoid colon subtle acute uncomplicated-colitis with cholelithiasis and aortic atherosclerosis.   Acute diverticulitis, recurrent - She is being admitted due to failure of outpatient antibiotic therapy for her sigmoid acute diverticulitis. - IV zosyn - Pain management prn - IVF -- Patient follows with G.I. Dr. Timothy Lasso as outpatient. Consider G.I. consultation if no improvement.   Sepsis due to undetermined organism (HCC) -  secondary to #1. - leukocytosis, tachycardia and tachypnea. - Management as above. -  follow blood cultures negative   Type 2 diabetes mellitus without complications (HCC) - on supplemental coverage with NovoLog.   Essential hypertension -  continue her antihypertensive therapy.  Evaluated by PT--. Recommends  home health. Patient fairly active.     DVT prophylaxis: Lovenox. Advanced Care Planning:  Code Status: The patient is DNR and DNI. Family communication :none today Consults :none DVT Prophylaxis :enoxaparin Level of care: Telemetry Medical Status is: Inpatient Remains inpatient appropriate because: acute recurrent diverticulitis    TOTAL TIME TAKING CARE OF THIS PATIENT: 35 minutes.   >50% time spent on counselling and coordination of care  Note: This dictation was prepared with Dragon dictation along with smaller phrase technology. Any transcriptional errors that result from this process are unintentional.  Enedina Finner M.D    [facility]   CC: Primary care physician; Barbette Reichmann, MD

## 2022-12-12 NOTE — Assessment & Plan Note (Signed)
-  We will continue her antihypertensive therapy 

## 2022-12-12 NOTE — Progress Notes (Signed)
Mobility Specialist - Progress Note   12/12/22 1553  Mobility  Activity Ambulated with assistance in room;Ambulated with assistance in hallway  Level of Assistance Contact guard assist, steadying assist  Assistive Device  (Rollator)  Distance Ambulated (ft) 40 ft  Activity Response Tolerated well  $Mobility charge 1 Mobility  Mobility Specialist Start Time (ACUTE ONLY) 1537  Mobility Specialist Stop Time (ACUTE ONLY) 1550  Mobility Specialist Time Calculation (min) (ACUTE ONLY) 13 min   Pt sitting in the recliner upon entry, utilizing RA. Pt voices concerns of dizziness, however states she is not currently dizzy. Pt STS to RW ModI-- extra time required for standing. Pt amb to the door in the room, some unsteadiness present, no LOB or dizziness. Pt amb ~6 ft outside of the room before expressing "weakness" in BLE. Pt returned to the room, left supine with alarm set and needs within reach. RN notified.  Zetta Bills Mobility Specialist 12/12/22 4:02 PM

## 2022-12-12 NOTE — Progress Notes (Signed)
Initial Nutrition Assessment  DOCUMENTATION CODES:   Not applicable  INTERVENTION:   Ensure Enlive po BID, each supplement provides 350 kcal and 20 grams of protein.  Magic cup TID with meals, each supplement provides 290 kcal and 9 grams of protein  MVI po daily   Pt at high refeed risk; recommend monitor potassium, magnesium and phosphorus labs daily until stable  Daily weights  Diverticulitis diet education   NUTRITION DIAGNOSIS:   Inadequate oral intake related to acute illness as evidenced by per patient/family report.  GOAL:   Patient will meet greater than or equal to 90% of their needs  MONITOR:   PO intake, Supplement acceptance, Labs, Weight trends, I & O's, Skin  REASON FOR ASSESSMENT:   Malnutrition Screening Tool    ASSESSMENT:   89 y/o female with h/o HTN, DM, HLD, stroke, SDH and diverticulosis who is admitted with acute diverticulitis and sepsis.  Met with pt in room today. Pt reports decreased appetite and oral intake for several days pta r/t nausea and abdominal pain. Pt reports fair appetite and oral intake in hospital; pt eating 50% of meals. Pt reports that she drinks Premier Protein at home. Pt is willing to drink Ensure in hospital. RD will add supplements to help pt meet her estimated needs. Pt is likely at refeed risk. RD discussed with pt the importance of adequate nutrition needed to preserve lean muscle. RD provided pt with diverticulitis diet education today.   Per chart, pt appears fairly weight stable at baseline.    Medications reviewed and include: plavix, B12, lovenox, MVI, senokot, LRS @50ml /hr, zosyn  Labs reviewed: K 3.5 wnl, BUN 7(L)  NUTRITION - FOCUSED PHYSICAL EXAM:  Flowsheet Row Most Recent Value  Orbital Region No depletion  Upper Arm Region Moderate depletion  Thoracic and Lumbar Region No depletion  Buccal Region No depletion  Temple Region Mild depletion  Clavicle Bone Region Moderate depletion  Clavicle and  Acromion Bone Region Moderate depletion  Scapular Bone Region Mild depletion  Dorsal Hand Moderate depletion  Patellar Region Moderate depletion  Anterior Thigh Region Moderate depletion  Posterior Calf Region Moderate depletion  Edema (RD Assessment) None  Hair Reviewed  Eyes Reviewed  Mouth Reviewed  Skin Reviewed  Nails Reviewed   Diet Order:   Diet Order             DIET SOFT Room service appropriate? Yes; Fluid consistency: Thin  Diet effective now                  EDUCATION NEEDS:   Education needs have been addressed  Skin:  Skin Assessment: Reviewed RN Assessment (ecchymosis)  Last BM:  pta  Height:   Ht Readings from Last 1 Encounters:  12/11/22 5' (1.524 m)    Weight:   Wt Readings from Last 1 Encounters:  12/11/22 54.3 kg    Ideal Body Weight:  45.4 kg  BMI:  Body mass index is 23.38 kg/m.  Estimated Nutritional Needs:   Kcal:  1200-1400kcal/day  Protein:  60-70g/day  Fluid:  1.2-1.4L/day  Betsey Holiday MS, RD, LDN Please refer to [facility] for RD and/or RD on-call/weekend/after hours pager

## 2022-12-12 NOTE — Evaluation (Signed)
Physical Therapy Evaluation Patient Details Name: Claire Washington MRN: [id number] DOB: 04-11-1925 Today's Date: 12/12/2022  History of Present Illness  Patient is a 89 year old female with nausea and vomiting with worsening generalized weakness and cough, acute diverticulitis. History of diabetes mellitus, hypertension, dyslipidemia, osteoarthritis  Clinical Impression  Patient is agreeable to PT evaluation and eager to get up and try walking. She lives alone at baseline in an independent living community. She uses a 4 wheeled walker at baseline.  The patient reports feeling generalized weakness today. She required contact guard assistance for short distance ambulation using rolling walker. Activity tolerance is limited by fatigue. Sp02 94% on room air. Recommend PT follow up to maximize independence and to decrease caregiver burden. The patient is hopeful to return home with the assistance from her daughter who is also a physical therapist.        If plan is discharge home, recommend the following: A little help with walking and/or transfers;A little help with bathing/dressing/bathroom;Assistance with cooking/housework;Assist for transportation   Can travel by private vehicle        Equipment Recommendations None recommended by PT  Recommendations for Other Services       Functional Status Assessment Patient has had a recent decline in their functional status and demonstrates the ability to make significant improvements in function in a reasonable and predictable amount of time.     Precautions / Restrictions Precautions Precautions: Fall Restrictions Weight Bearing Restrictions: No      Mobility  Bed Mobility Overal bed mobility: Needs Assistance Bed Mobility: Supine to Sit, Sit to Supine     Supine to sit: Min assist Sit to supine: Min assist   General bed mobility comments: assistance for trunk support to sit upright. assistance for LE support to return to bed.  verbal cues for technique    Transfers Overall transfer level: Needs assistance Equipment used: Rolling walker (2 wheels) Transfers: Sit to/from Stand Sit to Stand: Contact guard assist           General transfer comment: CGA for safety.    Ambulation/Gait Ambulation/Gait assistance: Contact guard assist Gait Distance (Feet): 40 Feet Assistive device: Rolling walker (2 wheels) Gait Pattern/deviations: Decreased stride length, Narrow base of support Gait velocity: decreased     General Gait Details: CGA and occasional verbal cues for safety. activity tolerance is limited by fatigue. several standing rest breaks required. patient prefers to use her 4 wheeled walker and her daughter plans to bring from home for next session  Stairs            Wheelchair Mobility     Tilt Bed    Modified Rankin (Stroke Patients Only)       Balance Overall balance assessment: Needs assistance Sitting-balance support: Feet supported Sitting balance-Leahy Scale: Good     Standing balance support: Bilateral upper extremity supported, Reliant on assistive device for balance Standing balance-Leahy Scale: Poor Standing balance comment: initial Min A required for safety.                             Pertinent Vitals/Pain Pain Assessment Pain Assessment: No/denies pain    Home Living Family/patient expects to be discharged to:: Private residence (independent living) Living Arrangements: Alone Available Help at Discharge: Family Type of Home: Apartment Home Access: Level entry       Home Layout: One level Home Equipment: Rollator (4 wheels) Additional Comments: patient reports she has not  had therapy recently    Prior Function Prior Level of Function : Independent/Modified Independent             Mobility Comments: using 4 wheeled walker       Extremity/Trunk Assessment   Upper Extremity Assessment Upper Extremity Assessment: Overall WFL for tasks  assessed    Lower Extremity Assessment Lower Extremity Assessment: Generalized weakness       Communication   Communication Communication: No apparent difficulties  Cognition Arousal: Alert Behavior During Therapy: WFL for tasks assessed/performed Overall Cognitive Status: Within Functional Limits for tasks assessed                                          General Comments General comments (skin integrity, edema, etc.): Sp02 94% on room air.    Exercises     Assessment/Plan    PT Assessment Patient needs continued PT services  PT Problem List Decreased strength;Decreased activity tolerance;Decreased balance;Decreased mobility;Decreased safety awareness       PT Treatment Interventions DME instruction;Gait training;Functional mobility training;Therapeutic activities;Therapeutic exercise;Neuromuscular re-education;Balance training;Patient/family education    PT Goals (Current goals can be found in the Care Plan section)  Acute Rehab PT Goals Patient Stated Goal: to return home with 24/7 support from daughter PT Goal Formulation: With patient/family Time For Goal Achievement: 12/26/22 Potential to Achieve Goals: Good    Frequency Min 1X/week     Co-evaluation               AM-PAC PT "6 Clicks" Mobility  Outcome Measure Help needed turning from your back to your side while in a flat bed without using bedrails?: None Help needed moving from lying on your back to sitting on the side of a flat bed without using bedrails?: A Little Help needed moving to and from a bed to a chair (including a wheelchair)?: A Little Help needed standing up from a chair using your arms (e.g., wheelchair or bedside chair)?: A Little Help needed to walk in hospital room?: A Little Help needed climbing 3-5 steps with a railing? : A Lot 6 Click Score: 18    End of Session Equipment Utilized During Treatment: Gait belt Activity Tolerance: Patient tolerated treatment  well Patient left: in bed;with call bell/phone within reach;with bed alarm set Nurse Communication: Mobility status PT Visit Diagnosis: Unsteadiness on feet (R26.81);Muscle weakness (generalized) (M62.81)    Time: 1610-9604 PT Time Calculation (min) (ACUTE ONLY): 22 min   Charges:   PT Evaluation $PT Eval Low Complexity: 1 Low PT Treatments $Therapeutic Activity: 8-22 mins PT General Charges $$ ACUTE PT VISIT: 1 Visit         Donna Bernard, PT, MPT   Ina Homes 12/12/2022, 2:07 PM

## 2022-12-12 NOTE — Assessment & Plan Note (Addendum)
-   She is being admitted due to failure of outpatient antibiotic therapy for her sigmoid acute diverticulitis. - She will be admitted to medical telemetry bed. - We will continue antibiotic therapy with IV Rocephin and Flagyl. - Pain management will be provided. - Will be hydrated with IV normal saline. - We will hold off Plavix given high risk of bleeding.

## 2022-12-12 NOTE — Assessment & Plan Note (Signed)
-   The will be placed on supplemental coverage with [employer].

## 2022-12-13 DIAGNOSIS — K5792 Diverticulitis of intestine, part unspecified, without perforation or abscess without bleeding: Secondary | ICD-10-CM | POA: Diagnosis not present

## 2022-12-13 MED ORDER — AMLODIPINE BESYLATE 5 MG PO TABS
2.5000 mg | ORAL_TABLET | Freq: Every day | ORAL | Status: DC
  Administered 2022-12-13 – 2022-12-14 (×2): 2.5 mg via ORAL
  Filled 2022-12-13 (×2): qty 1

## 2022-12-13 MED ORDER — POLYETHYLENE GLYCOL 3350 17 G PO PACK
17.0000 g | PACK | Freq: Every day | ORAL | Status: DC
  Administered 2022-12-14: 17 g via ORAL
  Filled 2022-12-13: qty 1

## 2022-12-13 NOTE — Progress Notes (Signed)
  PROGRESS NOTE    Claire Washington  NWG:[id number] DOB: 04/06/1925 DOA: 12/11/2022 PCP: Barbette Reichmann, MD  122A/122A-AA  LOS: 2 days   Brief hospital course:   Assessment & Plan: Claire Washington is a 89 y.o. Caucasian female with medical history significant for type II diabetes mellitus, hypertension, dyslipidemia, osteoarthritis, who presented to the emergency room with acute onset of worsening abdominal pain that was initially in the left lower quadrant and became generalized.  She was seen on an outpatient basis on 8/11 and managed outpatient for diverticulitis with Augmentin for 10 days .  Due to persistent symptoms she came to the ER yesterday and was given Keflex and Flagyl for acute diverticulitis on abdominal and pelvic CT scan.  She started having vomiting today and felt significantly weak with worsening abdominal pain.    CT scan abdomen revealed sigmoid colon subtle acute uncomplicated-colitis with cholelithiasis and aortic atherosclerosis.    Acute diverticulitis, recurrent - Pt still had abdominal pain despite outpatient tx with abx --started on ceftriaxone and flagyl and switched to zosyn Plan: --cont zosyn for now  Abdominal pain --possible from constipation. --start miralax   Sepsis due to diverticulitis (HCC) -  secondary to #1. - leukocytosis, tachycardia and tachypnea. - Management as above.   Type 2 diabetes mellitus without complications (HCC) --no need for BG checks   Essential hypertension --cont home amlodipine     DVT prophylaxis: Lovenox SQ Code Status: DNR  Family Communication: daughter updated at bedside today Level of care: Telemetry Medical Dispo:   The patient is from: home Anticipated d/c is to: home Anticipated d/c date is: 1-2 days   Subjective and Interval History:  Pt reported intermittent abdominal pain across lower abdomen, feeling of pressure and gas.   Objective: Vitals:   12/13/22 0500 12/13/22 0501 12/13/22  0810 12/13/22 1524  BP:  (!) 143/73 (!) 159/59 (!) 153/61  Pulse:  79 79 79  Resp:  19 18 16   Temp:  97.6 F (36.4 C) 97.9 F (36.6 C) 98.2 F (36.8 C)  TempSrc:  Oral    SpO2:  93% 95% 93%  Weight: 52.8 kg     Height:        Intake/Output Summary (Last 24 hours) at 12/13/2022 1959 Last data filed at 12/13/2022 1013 Gross per 24 hour  Intake 360 ml  Output --  Net 360 ml   Filed Weights   12/11/22 2057 12/13/22 0500  Weight: 54.3 kg 52.8 kg    Examination:   Constitutional: NAD, AAOx3 HEENT: conjunctivae and lids normal, EOMI CV: No cyanosis.   RESP: normal respiratory effort, on RA Abdomen: tender to palpation over lower abdomen Neuro: II - XII grossly intact.   Psych: Normal mood and affect.  Appropriate judgement and reason   Data Reviewed: I have personally reviewed labs and imaging studies  Time spent: 50 minutes  Darlin Priestly, MD [facility] If 7PM-7AM, please contact night-coverage 12/13/2022, 7:59 PM

## 2022-12-13 NOTE — Progress Notes (Signed)
Physical Therapy Treatment Patient Details Name: Claire Washington MRN: [id number] DOB: [birth year] Today's Date: 12/13/2022   History of Present Illness Patient is a 89 year old female with nausea and vomiting with worsening generalized weakness and cough, acute diverticulitis. History of diabetes mellitus, hypertension, dyslipidemia, osteoarthritis    PT Comments  Pt was long sitting, in bed, finishing breakfast upon arrival. She is alert and agreeable to PT. Was able to safely exit bed, stand to rollator and ambulate short distance. Gait distance limited by pt. Overall, she is slightly self limiting but tolerated session well. " Im planning to go home (ALF)." Pt is progressing however would benefit form continued skilled PT to maximize her safety during all ADLs.    If plan is discharge home, recommend the following: A little help with walking and/or transfers;A little help with bathing/dressing/bathroom;Assistance with cooking/housework;Assist for transportation     Equipment Recommendations  None recommended by PT       Precautions / Restrictions Precautions Precautions: Fall Restrictions Weight Bearing Restrictions: No     Mobility  Bed Mobility Overal bed mobility: Needs Assistance Bed Mobility: Supine to Sit  Supine to sit: Min assist  General bed mobility comments: Min assist + increased time. did c/o dizziness however BP stable." Ive been dizzy like this for awhile. its not new."    Transfers Overall transfer level: Needs assistance Equipment used: Rollator (4 wheels) Transfers: Sit to/from Stand Sit to Stand: Contact guard assist  General transfer comment: CGA for safety with Vcs for imporved technique for additional safety    Ambulation/Gait Ambulation/Gait assistance: Contact guard assist Gait Distance (Feet): 70 Feet Assistive device: Rollator (4 wheels) Gait Pattern/deviations: Decreased stride length, Narrow base of support Gait velocity: decreased   General Gait Details: CGA for safety. pt c/o increased dizziness during gait however BP remained stable. Pt somewhat self limiting.   Balance Overall balance assessment: Needs assistance Sitting-balance support: Feet supported Sitting balance-Leahy Scale: Good     Standing balance support: Bilateral upper extremity supported, During functional activity Standing balance-Leahy Scale: Poor Standing balance comment: pt is at high fall risk    Cognition Arousal: Alert Behavior During Therapy: WFL for tasks assessed/performed Overall Cognitive Status: Within Functional Limits for tasks assessed  General Comments: Pt is A and cooperative. Seems to be good historian of past medical history.            Pertinent Vitals/Pain Pain Assessment Pain Assessment: No/denies pain     PT Goals (current goals can now be found in the care plan section) Acute Rehab PT Goals Patient Stated Goal: " Get better so I can go back to my assisted living." Progress towards PT goals: Progressing toward goals    Frequency    Min 1X/week       AM-PAC PT "6 Clicks" Mobility   Outcome Measure  Help needed turning from your back to your side while in a flat bed without using bedrails?: None Help needed moving from lying on your back to sitting on the side of a flat bed without using bedrails?: A Little Help needed moving to and from a bed to a chair (including a wheelchair)?: A Little Help needed standing up from a chair using your arms (e.g., wheelchair or bedside chair)?: A Little Help needed to walk in hospital room?: A Little Help needed climbing 3-5 steps with a railing? : A Lot 6 Click Score: 18    End of Session Equipment Utilized During Treatment: Gait belt Activity Tolerance: Patient  tolerated treatment well Patient left: in chair;with call bell/phone within reach;with chair alarm set Nurse Communication: Mobility status PT Visit Diagnosis: Unsteadiness on feet (R26.81);Muscle weakness  (generalized) (M62.81)     Time: 6045-4098 PT Time Calculation (min) (ACUTE ONLY): 23 min  Charges:    $Gait Training: 8-22 mins $Therapeutic Activity: 8-22 mins PT General Charges $$ ACUTE PT VISIT: 1 Visit                     Jetta Lout PTA 12/13/22, 11:01 AM

## 2022-12-14 DIAGNOSIS — K5792 Diverticulitis of intestine, part unspecified, without perforation or abscess without bleeding: Secondary | ICD-10-CM | POA: Diagnosis not present

## 2022-12-14 NOTE — Care Management Important Message (Signed)
Important Message  Patient Details  Name: Claire Washington MRN: [id number] Date of Birth: 05/16/1924   Medicare Important Message Given:  Yes     Olegario Messier A Anupama Piehl 12/14/2022, 11:23 AM

## 2022-12-14 NOTE — TOC Initial Note (Addendum)
Transition of Care [facility]) - Initial/Assessment Note    Patient Details  Name: Claire Washington MRN: [id number] Date of Birth: 06/29/1924  Transition of Care [facility]) CM/SW Contact:    Garret Reddish, RN Phone Number: 12/14/2022, 10:52 AM  Clinical Narrative:      Chart reviewed.  Noted that patient was admitted with   Acute diverticulitis.  Patient failed outpatient treatment and is currently on IV Zosyn.  Patient also with constipation and has been started on Miralax.    I have spoken with Mrs. Rosiak.  She informs me that prior to admission she lives at home by herself.  She has two supportive daughters.  She was able to get around her home with a  4 wheeled rolling walker.         Patient plans on returning back to her home when she is stable for discharge.  I have spoken to patient about PT recommendation for home health PT on discharge.  Mrs. Vanbrocklin reports that she has used [facility] in the past and would like to use their services again on discharge.  Patient reports that she does have a rolling walker at home.   Patient has 2 supportive daughter that assist with needs at home and appointments.    I have asked Barbara Cower with [facility] to accept home health referral.    TOC will continue to follow for discharge planning.    Expected Discharge Plan: Home w Home Health Services Barriers to Discharge: Continued Medical Work up   Patient Goals and CMS Choice   CMS Medicare.gov Compare Post Acute Care list provided to:: Patient Choice offered to / list presented to : Patient      Expected Discharge Plan and Services   Discharge Planning Services: CM Consult Post Acute Care Choice: Home Health Living arrangements for the past 2 months: Single Family Home                           HH Arranged: PT, OT HH Agency: [facility] ([facility]) Date HH Agency Contacted: 12/13/22 Time HH Agency Contacted: 1000 Representative spoke with at [facility] Agency: Barbara Cower  Prior Living  Arrangements/Services Living arrangements for the past 2 months: Single Family Home Lives with:: Self Patient language and need for interpreter reviewed:: Yes Do you feel safe going back to the place where you live?: Yes      Need for Family Participation in Patient Care: Yes (Comment) Care giver support system in place?: Yes (comment) (Patient has supportive daughters) Current home services: DME    Activities of Daily Living Home Assistive Devices/Equipment: Environmental consultant (specify type), Bedside commode/3-in-1 ADL Screening (condition at time of admission) Patient's cognitive ability adequate to safely complete daily activities?: Yes Is the patient deaf or have difficulty hearing?: Yes Does the patient have difficulty seeing, even when wearing glasses/contacts?: No Does the patient have difficulty concentrating, remembering, or making decisions?: No Patient able to express need for assistance with ADLs?: Yes Does the patient have difficulty dressing or bathing?: No Independently performs ADLs?: Yes (appropriate for developmental age) Does the patient have difficulty walking or climbing stairs?: Yes Weakness of Legs: Both Weakness of Arms/Hands: Both  Permission Sought/Granted                  Emotional Assessment Appearance:: Appears stated age Attitude/Demeanor/Rapport: Engaged Affect (typically observed): Appropriate Orientation: : Oriented to Self, Oriented to Place, Oriented to  Time, Oriented to Situation  Admission diagnosis:  Diverticulitis [K57.92] Acute diverticulitis [K57.92] Nausea and vomiting, unspecified vomiting type [R11.2] Patient Active Problem List   Diagnosis Date Noted   Sepsis due to undetermined organism (HCC) 12/12/2022   Type 2 diabetes mellitus without complications (HCC) 12/12/2022   Acute diverticulitis 12/11/2022   Acute respiratory failure with hypoxia (HCC) 01/18/2022   Nausea vomiting and diarrhea 01/14/2022   Sigmoid diverticulitis  01/13/2022   Hyponatremia 08/31/2020   Lymphedema 03/09/2019   Swelling of limb 02/28/2019   Cellulitis of left leg 12/31/2018   Hip pain, bilateral 12/31/2018   Cellulitis 12/27/2018   Carotid stenosis 12/28/2017   Vertigo 11/30/2017   Imbalance due to old stroke 11/03/2016   Gait instability 10/19/2016   Ataxia 08/30/2016   History of stroke 08/30/2016   Dizziness 11/03/2015   Abnormal PET scan of colon 06/18/2014   H/O seasonal allergies 06/04/2014   Lung nodule, solitary 06/04/2014   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 [place] [address] Phone: [phone] Fax: [phone]  [facility] - [place], [place] - [address] [place] [address] Phone: [phone] Fax: [phone]     Social Determinants of Health (SDOH) Social History: SDOH Screenings   Food Insecurity: No Food Insecurity (12/12/2022)  Housing: Low Risk  (12/12/2022)  Transportation Needs: No Transportation Needs (12/12/2022)  Utilities: Not At Risk (12/12/2022)  Financial Resource Strain: Low Risk  (08/31/2022)   Received from [facility]  Tobacco Use: Medium Risk (12/11/2022)   SDOH Interventions: Utilities Interventions: Patient Declined   Readmission Risk Interventions     No data to display

## 2022-12-14 NOTE — Progress Notes (Signed)
  PROGRESS NOTE    Claire Washington  GUY:[id number] DOB: 1925/02/09 DOA: 12/11/2022 PCP: Barbette Reichmann, MD  122A/122A-AA  LOS: 3 days   Brief hospital course:   Assessment & Plan: Claire Washington is a 89 y.o. Caucasian female with medical history significant for type II diabetes mellitus, hypertension, dyslipidemia, osteoarthritis, who presented to the emergency room with acute onset of worsening abdominal pain that was initially in the left lower quadrant and became generalized.  She was seen on an outpatient basis on 8/11 and managed outpatient for diverticulitis with Augmentin for 10 days .  Due to persistent symptoms she came to the ER yesterday and was given Keflex and Flagyl for acute diverticulitis on abdominal and pelvic CT scan.  She started having vomiting today and felt significantly weak with worsening abdominal pain.    CT scan abdomen revealed sigmoid colon subtle acute uncomplicated-colitis with cholelithiasis and aortic atherosclerosis.    Acute diverticulitis, recurrent - Pt still had abdominal pain despite outpatient tx with abx --started on ceftriaxone and flagyl and switched to zosyn Plan: --cont zosyn for now  Abdominal pain --more likely from constipation than diverticulitis --had multiple BM's with bowel regimen --cont scheduled Miralax daily   Sepsis due to diverticulitis (HCC) -  secondary to #1. - leukocytosis, tachycardia and tachypnea. - Management as above.   Type 2 diabetes mellitus without complications (HCC) --no need for BG checks   Essential hypertension --cont home amlodipine    DVT prophylaxis: Lovenox SQ Code Status: DNR  Family Communication: daughter updated on the phone today Level of care: Telemetry Medical Dispo:   The patient is from: home Anticipated d/c is to: home Anticipated d/c date is: tomorrow   Subjective and Interval History:  Pt reported several BM's of liquid stool for the past day.  Abdominal pain  resolved.   Objective: Vitals:   12/14/22 0454 12/14/22 0500 12/14/22 0743 12/14/22 1554  BP: (!) 162/66  (!) 159/69 (!) 149/65  Pulse: 78  73 80  Resp: 18  18 17   Temp: 97.9 F (36.6 C)  97.8 F (36.6 C) 98 F (36.7 C)  TempSrc:      SpO2: 95%  95% 97%  Weight:  53.6 kg    Height:        Intake/Output Summary (Last 24 hours) at 12/14/2022 1925 Last data filed at 12/14/2022 1917 Gross per 24 hour  Intake 360 ml  Output --  Net 360 ml   Filed Weights   12/11/22 2057 12/13/22 0500 12/14/22 0500  Weight: 54.3 kg 52.8 kg 53.6 kg    Examination:   Constitutional: NAD, AAOx3 HEENT: conjunctivae and lids normal, EOMI CV: No cyanosis.   RESP: normal respiratory effort, on RA Neuro: II - XII grossly intact.   Psych: Normal mood and affect.  Appropriate judgement and reason   Data Reviewed: I have personally reviewed labs and imaging studies  Time spent: 35 minutes  Darlin Priestly, MD [facility] If 7PM-7AM, please contact night-coverage 12/14/2022, 7:25 PM

## 2022-12-14 NOTE — Progress Notes (Signed)
Physical Therapy Treatment Patient Details Name: Claire Washington MRN: [id number] DOB: [birth year] Today's Date: 12/14/2022   History of Present Illness Patient is a 89 year old female with nausea and vomiting with worsening generalized weakness and cough, acute diverticulitis. History of diabetes mellitus, hypertension, dyslipidemia, osteoarthritis    PT Comments  Pt was long sitting in bed. Agreeable to session and remained cooperative. Demonstrates safe abilities to exit bed, stand to rollator, and tolerate ambulation ~ 120 ft. No LOB noted. Pt is planning to DC home with daughter (PT) to assist her at least through the weekend. DC recs remain appropriate.     If plan is discharge home, recommend the following: A little help with walking and/or transfers;A little help with bathing/dressing/bathroom;Assistance with cooking/housework;Assist for transportation     Equipment Recommendations  None recommended by PT       Precautions / Restrictions Precautions Precautions: Fall Restrictions Weight Bearing Restrictions: No     Mobility  Bed Mobility Overal bed mobility: Needs Assistance Bed Mobility: Supine to Sit  Supine to sit: Supervision   Transfers Overall transfer level: Needs assistance Equipment used: Rollator (4 wheels) Transfers: Sit to/from Stand Sit to Stand: Contact guard assist   Ambulation/Gait Ambulation/Gait assistance: Supervision Gait Distance (Feet): 120 Feet Assistive device: Rollator (4 wheels) Gait Pattern/deviations: Decreased stride length, Narrow base of support Gait velocity: decreased  General Gait Details: supervision for safety     Balance Overall balance assessment: Needs assistance Sitting-balance support: Feet supported Sitting balance-Leahy Scale: Good     Standing balance support: Bilateral upper extremity supported, During functional activity Standing balance-Leahy Scale: Poor Standing balance comment: high fall risk        Cognition Arousal: Alert Behavior During Therapy: WFL for tasks assessed/performed Overall Cognitive Status: Within Functional Limits for tasks assessed    General Comments: Pt is A and cooperative. Seems to be good historian of past medical history.               Pertinent Vitals/Pain Pain Assessment Pain Assessment: No/denies pain     PT Goals (current goals can now be found in the care plan section) Acute Rehab PT Goals Patient Stated Goal: go home tomorrow Progress towards PT goals: Progressing toward goals    Frequency    Min 1X/week       AM-PAC PT "6 Clicks" Mobility   Outcome Measure  Help needed turning from your back to your side while in a flat bed without using bedrails?: None Help needed moving from lying on your back to sitting on the side of a flat bed without using bedrails?: A Little Help needed moving to and from a bed to a chair (including a wheelchair)?: A Little Help needed standing up from a chair using your arms (e.g., wheelchair or bedside chair)?: A Little Help needed to walk in hospital room?: A Little Help needed climbing 3-5 steps with a railing? : A Lot 6 Click Score: 18    End of Session Equipment Utilized During Treatment: Gait belt Activity Tolerance: Patient tolerated treatment well Patient left: in chair;with call bell/phone within reach;with chair alarm set Nurse Communication: Mobility status PT Visit Diagnosis: Unsteadiness on feet (R26.81);Muscle weakness (generalized) (M62.81)     Time: 7829-5621 PT Time Calculation (min) (ACUTE ONLY): 24 min  Charges:    $Gait Training: 8-22 mins $Therapeutic Activity: 8-22 mins PT General Charges $$ ACUTE PT VISIT: 1 Visit  Jetta Lout PTA 12/14/22, 1:04 PM

## 2022-12-14 NOTE — Progress Notes (Signed)
Nutrition Follow Up Note   DOCUMENTATION CODES:   Not applicable  INTERVENTION:   Ensure Enlive po BID, each supplement provides 350 kcal and 20 grams of protein.  Magic cup TID with meals, each supplement provides 290 kcal and 9 grams of protein  MVI po daily   Pt remains at high refeed risk; recommend monitor potassium, magnesium and phosphorus labs daily until stable  Daily weights  Diverticulitis diet education   NUTRITION DIAGNOSIS:   Inadequate oral intake related to acute illness as evidenced by per patient/family report.  GOAL:   Patient will meet greater than or equal to 90% of their needs -not met   MONITOR:   PO intake, Supplement acceptance, Labs, Weight trends, I & O's, Skin  ASSESSMENT:   89 y/o female with h/o HTN, DM, HLD, stroke, SDH and diverticulosis who is admitted with acute diverticulitis and sepsis.  Met with pt in room today. Pt reports continued poor appetite and oral intake in hospital; pt documented to be eating <25% of meals. Pt ate 25% of a sandwich for lunch today and ate 100% of a [employer]. Pt was working on an Ensure at time of RD visit. Pt with numerous questions regarding her diet. RD provided additional diverticulitis diet education today and provided handouts with supporting information. Pt remains at high refeed risk. Per chart, pt appears fairly weight stable since admission.    Medications reviewed and include: plavix, B12, lovenox, MVI, miralax, zosyn  Labs reviewed: K 3.5 wnl, BUN 7(L)- 8/27  Diet Order:   Diet Order             DIET SOFT Room service appropriate? Yes; Fluid consistency: Thin  Diet effective now                  EDUCATION NEEDS:   Education needs have been addressed  Skin:  Skin Assessment: Reviewed RN Assessment (ecchymosis)  Last BM:  8/29- type 6  Height:   Ht Readings from Last 1 Encounters:  12/11/22 5' (1.524 m)    Weight:   Wt Readings from Last 1 Encounters:  12/14/22 53.6 kg     Ideal Body Weight:  45.4 kg  BMI:  Body mass index is 23.08 kg/m.  Estimated Nutritional Needs:   Kcal:  1200-1400kcal/day  Protein:  60-70g/day  Fluid:  1.2-1.4L/day  Betsey Holiday MS, RD, LDN Please refer to [facility] for RD and/or RD on-call/weekend/after hours pager

## 2022-12-14 NOTE — Progress Notes (Signed)
Mobility Specialist - Progress Note   12/14/22 1400  Mobility  Activity Ambulated with assistance in hallway  Level of Assistance Standby assist, set-up cues, supervision of patient - no hands on  Assistive Device Four wheel walker  Distance Ambulated (ft) 95 ft  Activity Response Tolerated well  $Mobility charge 1 Mobility     Pt lying in bed upon arrival, utilizing RA. Pt agreeable to activity. Completed bed mobility and STS with minA. Ambulated in hallway with 4WW + minG. No complaints. Pt returned to bed with assist onto LE. Alarm set, needs in reach.    Filiberto Pinks Mobility Specialist 12/14/22, 2:18 PM

## 2022-12-15 ENCOUNTER — Inpatient Hospital Stay: Payer: Medicare Other

## 2022-12-15 DIAGNOSIS — K5792 Diverticulitis of intestine, part unspecified, without perforation or abscess without bleeding: Secondary | ICD-10-CM | POA: Diagnosis not present

## 2022-12-15 LAB — BASIC METABOLIC PANEL
Anion gap: 15 (ref 5–15)
BUN: 15 mg/dL (ref 8–23)
CO2: 28 mmol/L (ref 22–32)
Calcium: 9.1 mg/dL (ref 8.9–10.3)
Chloride: 101 mmol/L (ref 98–111)
Creatinine, Ser: 0.45 mg/dL (ref 0.44–1.00)
GFR, Estimated: >60 mL/min (ref >60)
Glucose, Bld: 119 mg/dL — ABNORMAL HIGH (ref 70–99)
Potassium: 3.1 mmol/L — ABNORMAL LOW (ref 3.5–5.1)
Sodium: 144 mmol/L (ref 135–145)

## 2022-12-15 LAB — PHOSPHORUS: Phosphorus: 3.2 mg/dL (ref 2.5–4.6)

## 2022-12-15 LAB — MAGNESIUM: Magnesium: 2.3 mg/dL (ref 1.7–2.4)

## 2022-12-15 MED ORDER — POTASSIUM CHLORIDE CRYS ER 20 MEQ PO TBCR
40.0000 meq | EXTENDED_RELEASE_TABLET | Freq: Once | ORAL | Status: AC
  Administered 2022-12-15: 40 meq via ORAL
  Filled 2022-12-15: qty 2

## 2022-12-15 MED ORDER — ENSURE ENLIVE PO LIQD: 237.0000 mL | Freq: Two times a day (BID) | ORAL | Status: AC

## 2022-12-15 NOTE — TOC Transition Note (Signed)
Transition of Care [facility]) - CM/SW Discharge Note   Patient Details  Name: Claire Washington MRN: [id number] Date of Birth: 1924/08/25  Transition of Care [facility]) CM/SW Contact:  Garret Reddish, RN Phone Number: 12/15/2022, 10:16 AM   Clinical Narrative:   Chart reviewed.  I spoke with patient's daughter Kelle Darting on yesterday.  I informed [name] that PT has recommended Home Health PT on discharge.  I have made [name] aware that [name] requested to use [facility] for Home Health PT and OT services.  I have explained to [name] about the services that home health services can provide ane the time frame that they are usually in the home.  I have informed [name] that the provider would call and update her on the status on discharge. I [name] that possibly a tentative discharge for 12-15-22.  I have reviewed chart.  Noted that patient has orders for discharge today. I have informed Morrie Sheldon with [facility] that patient would be a discharge for today.  I have asked Morrie Sheldon to make [name] aware when the first home visit will take place.  Morrie Sheldon reports that she will check with the branch and provide and update to [name].    I have informed staff nurse of the above information.     Final next level of care: Home w Home Health Services Barriers to Discharge: No Barriers Identified   Patient Goals and CMS Choice CMS Medicare.gov Compare Post Acute Care list provided to:: Patient Choice offered to / list presented to : Patient  Discharge Placement                    Name of family member notified: Kelle Darting Patient and family notified of of transfer: 12/15/22  Discharge Plan and Services Additional resources added to the After Visit Summary for     Discharge Planning Services: CM Consult Post Acute Care Choice: Home Health                    HH Arranged: PT, OT [facility] Agency: [facility] Date [facility] Agency Contacted: 12/15/22 Time HH  Agency Contacted: 1000 Representative spoke with at [facility] Agency: Morrie Sheldon (I have made Morrie Sheldon aware that patient will be a discharge for today.)  Social Determinants of Health (SDOH) Interventions SDOH Screenings   Food Insecurity: No Food Insecurity (12/12/2022)  Housing: Low Risk  (12/12/2022)  Transportation Needs: No Transportation Needs (12/12/2022)  Utilities: Not At Risk (12/12/2022)  Financial Resource Strain: Low Risk  (08/31/2022)   Received from [facility]  Tobacco Use: Medium Risk (12/11/2022)     Readmission Risk Interventions     No data to display

## 2022-12-16 LAB — CULTURE, BLOOD (ROUTINE X 2)
Culture: NO GROWTH
Culture: NO GROWTH
Special Requests: ADEQUATE
Special Requests: ADEQUATE

## 2022-12-21 NOTE — Discharge Summary (Addendum)
Physician Discharge Summary   Claire Washington  female DOB: 1925-04-07  RUE:[id number]  PCP: Barbette Reichmann, MD  Admit date: 12/11/2022 Discharge date: 12/15/2022  Admitted From: home Disposition:  home Home Health: Yes CODE STATUS: DNR   Hospital Course:  For full details, please see H&P, progress notes, consult notes and ancillary notes.  Briefly,  Claire Washington is a 89 y.o. Caucasian female with medical history significant for type II diabetes mellitus, hypertension, who presented to the emergency room with acute onset of worsening abdominal pain that was initially in the left lower quadrant and became generalized.    She was seen on an outpatient basis on 8/11 and managed outpatient for diverticulitis with Augmentin for 10 days .  Due to persistent symptoms she came to the ER yesterday and was given Keflex and Flagyl for acute diverticulitis on abdominal and pelvic CT scan.  She started having vomiting today and felt significantly weak with worsening abdominal pain.    CT scan abdomen revealed Sigmoid colon diverticulosis with possible subtle findings of acute uncomplicated diverticulitis.  Cholelithiasis with no acute pericholecystic inflammation or biliary dilatation.   Abdominal pain --more likely from constipation than diverticulitis, as pt had already been properly treated for diverticulitis and CT imaging showed mild disease. --Pt had multiple BM's with Senna, and improvement of abdominal pain. --cont scheduled Miralax daily after discharge to prevent constipation.  Acute diverticulitis --Pt was started on ceftriaxone and flagyl on presentation and switched to zosyn.  Abx was not continued after discharge since pt had already received appropriate abx PTA. --Pt and family would like to follow up with GI MD as outpatient.  I relayed the message to Dr. Timothy Lasso.     Sepsis, ruled out   Type 2 diabetes mellitus without complications (HCC) --not on hypoglycemics  at home.  no need for BG checks   Essential hypertension --cont home amlodipine --cont home oral hydralazine PRN   Unless noted above, medications under "STOP" list are ones pt was not taking PTA.  Discharge Diagnoses:  Principal Problem:   Acute diverticulitis Active Problems:   Sepsis due to undetermined organism (HCC)   Type 2 diabetes mellitus without complications (HCC)   Essential hypertension   30 Day Unplanned Readmission Risk Score    Flowsheet Row ED to Hosp-Admission (Discharged) from 12/11/2022 in [facility] 1C MEDICAL TELEMETRY  30 Day Unplanned Readmission Risk Score (%) 13.09 Filed at 12/15/2022 1600       This score is the patient's risk of an unplanned readmission within 30 days of being discharged (0 -100%). The score is based on dignosis, age, lab data, medications, orders, and past utilization.   Low:  0-14.9   Medium: 15-21.9   High: 22-29.9   Extreme: 30 and above         Discharge Instructions:  Allergies as of 12/15/2022       Reactions   Latex Dermatitis   Hydrocortisone Swelling   Sulfa Antibiotics Other (See Comments)   Reaction: unknown Other reaction(s): Not available   Tape Dermatitis        Medication List     STOP taking these medications    albuterol 108 (90 Base) MCG/ACT inhaler Commonly known as: VENTOLIN HFA   cephALEXin 500 MG capsule Commonly known as: KEFLEX   metroNIDAZOLE 500 MG tablet Commonly known as: Flagyl   senna 8.6 MG Tabs tablet Commonly known as: SENOKOT       TAKE these medications  acetaminophen 325 MG tablet Commonly known as: TYLENOL Take 2 tablets (650 mg total) by mouth every 6 (six) hours as needed for mild pain, moderate pain, fever or headache (or Fever >/= 101).   amLODipine 5 MG tablet Commonly known as: NORVASC Take 5 mg by mouth every evening. What changed: Another medication with the same name was changed. Make sure you understand how and when to take  each.   amLODipine 2.5 MG tablet Commonly known as: NORVASC Take 1 tablet (2.5 mg total) by mouth every evening. What changed: when to take this   CENTRUM SILVER PO Take 1 tablet by mouth daily. Reported on 06/11/2015   clopidogrel 75 MG tablet Commonly known as: PLAVIX TAKE 1 TABLET BY MOUTH EVERY DAY What changed: when to take this   cyanocobalamin 1000 MCG tablet Commonly known as: VITAMIN B12 Take 500 mcg by mouth daily.   feeding supplement Liqd Take 237 mLs by mouth 2 (two) times daily between meals.   hydrALAZINE 25 MG tablet Commonly known as: APRESOLINE TAKE 1 TABLET BY MOUTH 3 TIMES DAILY AS NEEDED FOR SYSTOLIC BLOOD PRESSURE >160   magnesium hydroxide 400 MG/5ML suspension Commonly known as: MILK OF MAGNESIA Take 5 mLs by mouth daily as needed for mild constipation.   polyethylene glycol 17 g packet Commonly known as: MIRALAX / GLYCOLAX Take 17 g by mouth daily.   Vitamin D (Ergocalciferol) 1.25 MG (50000 UNIT) Caps capsule Commonly known as: DRISDOL Take 50,000 Units by mouth every 7 (seven) days.         Follow-up Information     Barbette Reichmann, MD Follow up in 1 week(s).   Specialty: Internal Medicine Contact information: [facility] [address] [place] [address] [phone]         Jaynie Collins, DO Follow up in 2 week(s).   Specialty: Gastroenterology Contact information: [address] [facility] [place] [address] [phone]                 Allergies  Allergen Reactions   Latex Dermatitis   Hydrocortisone Swelling   Sulfa Antibiotics Other (See Comments)    Reaction: unknown Other reaction(s): Not available   Tape Dermatitis     The results of significant diagnostics from this hospitalization (including imaging, microbiology, ancillary and laboratory) are listed below for reference.   Consultations:   Procedures/Studies: DG Abd 1 View  Result  Date: 12/15/2022 CLINICAL DATA:  Diarrhea.  History of diverticulitis. EXAM: ABDOMEN - 1 VIEW COMPARISON:  CT abdomen and pelvis 12/10/2022 FINDINGS: Gas is present in nondilated loops of small and large bowel as well as in the rectum without evidence of obstruction. A 1.9 cm gallstone is again noted. The bones are diffusely osteopenic. There is moderate lumbar levoscoliosis. IMPRESSION: Nonobstructed bowel gas pattern. Cholelithiasis. Electronically Signed   By: Sebastian Ache M.D.   On: 12/15/2022 09:07   DG Chest Portable 1 View  Result Date: 12/11/2022 CLINICAL DATA:  Shortness of breath and vomiting EXAM: PORTABLE CHEST 1 VIEW COMPARISON:  09/29/2020 FINDINGS: Cardiac shadow is stable. Aortic calcifications are again seen. The lungs are well aerated bilaterally. Previously seen right lung nodule is again seen and stable. No new focal abnormality is noted. No bony abnormality is seen. IMPRESSION: No acute abnormality noted. Electronically Signed   By: Alcide Clever M.D.   On: 12/11/2022 21:58   CT ABDOMEN PELVIS W CONTRAST  Result Date: 12/10/2022 CLINICAL DATA:  Left lower quadrant abdominal pain.  EXAM: CT ABDOMEN AND PELVIS WITH CONTRAST TECHNIQUE: Multidetector CT imaging of the abdomen and pelvis was performed using the standard protocol following bolus administration of intravenous contrast. RADIATION DOSE REDUCTION: This exam was performed according to the departmental dose-optimization program which includes automated exposure control, adjustment of the mA and/or kV according to patient size and/or use of iterative reconstruction technique. CONTRAST:  65mL OMNIPAQUE IOHEXOL 300 MG/ML  SOLN COMPARISON:  CT abdomen and pelvis 01/13/2022 FINDINGS: Lower chest: Mild chronic subpleural reticulation in the lung bases. No acute basilar lung consolidation or pleural effusion. Hepatobiliary: No focal liver abnormality. Persistent 1.9 cm stone in the gallbladder. No acute pericholecystic inflammation or  biliary dilatation. Pancreas: Unremarkable. Spleen: Unremarkable. Adrenals/Urinary Tract: Unremarkable adrenal glands. No renal calculi or hydronephrosis. Similar appearance of subcentimeter hypodense lesions in the kidneys, too small to fully characterize and with no follow-up imaging recommended. Unremarkable bladder. Stomach/Bowel: The stomach is unremarkable. There is no evidence of bowel obstruction. There is prominent diverticulosis of the sigmoid colon with question of subtle acute inflammation (for example series 6, image 73). No extraluminal gas or fluid collection is identified. Vascular/Lymphatic: Extensive abdominal aortic atherosclerosis without aneurysm. No enlarged lymph nodes. Reproductive: Status post hysterectomy. No adnexal masses. Other: No ascites or pneumoperitoneum. Musculoskeletal: Diffuse osteopenia. Levoscoliosis and advanced disc and facet degeneration in the lumbar spine. IMPRESSION: 1. Sigmoid colon diverticulosis with possible subtle findings of acute uncomplicated diverticulitis. 2. Cholelithiasis. 3.  Aortic Atherosclerosis (ICD10-I70.0). Electronically Signed   By: Sebastian Ache M.D.   On: 12/10/2022 12:23      Labs: BNP (last 3 results) No results for input(s): "BNP" in the last 8760 hours. Basic Metabolic Panel: Recent Labs  Lab 12/15/22 0350  NA 144  K 3.1*  CL 101  CO2 28  GLUCOSE 119*  BUN 15  CREATININE 0.45  CALCIUM 9.1  MG 2.3  PHOS 3.2   Liver Function Tests: No results for input(s): "AST", "ALT", "ALKPHOS", "BILITOT", "PROT", "ALBUMIN" in the last 168 hours. No results for input(s): "LIPASE", "AMYLASE" in the last 168 hours. No results for input(s): "AMMONIA" in the last 168 hours. CBC: No results for input(s): "WBC", "NEUTROABS", "HGB", "HCT", "MCV", "PLT" in the last 168 hours. Cardiac Enzymes: No results for input(s): "CKTOTAL", "CKMB", "CKMBINDEX", "TROPONINI" in the last 168 hours. BNP: Invalid input(s): "POCBNP" CBG: No results for  input(s): "GLUCAP" in the last 168 hours. D-Dimer No results for input(s): "DDIMER" in the last 72 hours. Hgb A1c No results for input(s): "HGBA1C" in the last 72 hours. Lipid Profile No results for input(s): "CHOL", "HDL", "LDLCALC", "TRIG", "CHOLHDL", "LDLDIRECT" in the last 72 hours. Thyroid function studies No results for input(s): "TSH", "T4TOTAL", "T3FREE", "THYROIDAB" in the last 72 hours.  Invalid input(s): "FREET3" Anemia work up No results for input(s): "VITAMINB12", "FOLATE", "FERRITIN", "TIBC", "IRON", "RETICCTPCT" in the last 72 hours. Urinalysis    Component Value Date/Time   COLORURINE STRAW (A) 12/12/2022 1127   APPEARANCEUR CLEAR (A) 12/12/2022 1127   LABSPEC 1.003 (L) 12/12/2022 1127   PHURINE 7.0 12/12/2022 1127   GLUCOSEU NEGATIVE 12/12/2022 1127   HGBUR NEGATIVE 12/12/2022 1127   BILIRUBINUR NEGATIVE 12/12/2022 1127   KETONESUR NEGATIVE 12/12/2022 1127   PROTEINUR NEGATIVE 12/12/2022 1127   NITRITE NEGATIVE 12/12/2022 1127   LEUKOCYTESUR NEGATIVE 12/12/2022 1127   Sepsis Labs No results for input(s): "WBC" in the last 168 hours.  Invalid input(s): "PROCALCITONIN", "LACTICIDVEN" Microbiology No results found for this or any previous visit (from the past 240 hour(s)).  Total time spend on discharging this patient, including the last patient exam, discussing the hospital stay, instructions for ongoing care as it relates to all pertinent caregivers, as well as preparing the medical discharge records, prescriptions, and/or referrals as applicable, is 35 minutes.    Darlin Priestly, MD  [facility] 12/21/2022, 10:49 PM

## 2022-12-25 ENCOUNTER — Inpatient Hospital Stay
Admission: EM | Admit: 2022-12-25 | Discharge: 2022-12-31 | DRG: 871 | Disposition: A | Discharge status: Home Health | Payer: Medicare Other | Attending: Internal Medicine | Admitting: Internal Medicine

## 2022-12-25 ENCOUNTER — Emergency Department: Payer: Medicare Other

## 2022-12-25 ENCOUNTER — Encounter: Payer: Self-pay | Admitting: Emergency Medicine

## 2022-12-25 ENCOUNTER — Other Ambulatory Visit: Payer: Self-pay

## 2022-12-25 DIAGNOSIS — Z800 Family history of malignant neoplasm of digestive organs: Secondary | ICD-10-CM

## 2022-12-25 DIAGNOSIS — Z7902 Long term (current) use of antithrombotics/antiplatelets: Secondary | ICD-10-CM

## 2022-12-25 DIAGNOSIS — K5792 Diverticulitis of intestine, part unspecified, without perforation or abscess without bleeding: Secondary | ICD-10-CM | POA: Diagnosis not present

## 2022-12-25 DIAGNOSIS — Z823 Family history of stroke: Secondary | ICD-10-CM

## 2022-12-25 DIAGNOSIS — Z8042 Family history of malignant neoplasm of prostate: Secondary | ICD-10-CM

## 2022-12-25 DIAGNOSIS — Z8673 Personal history of transient ischemic attack (TIA), and cerebral infarction without residual deficits: Secondary | ICD-10-CM

## 2022-12-25 DIAGNOSIS — Z8249 Family history of ischemic heart disease and other diseases of the circulatory system: Secondary | ICD-10-CM

## 2022-12-25 DIAGNOSIS — G319 Degenerative disease of nervous system, unspecified: Secondary | ICD-10-CM

## 2022-12-25 DIAGNOSIS — Z79899 Other long term (current) drug therapy: Secondary | ICD-10-CM

## 2022-12-25 DIAGNOSIS — Z833 Family history of diabetes mellitus: Secondary | ICD-10-CM

## 2022-12-25 DIAGNOSIS — E785 Hyperlipidemia, unspecified: Secondary | ICD-10-CM | POA: Diagnosis present

## 2022-12-25 DIAGNOSIS — E860 Dehydration: Secondary | ICD-10-CM | POA: Diagnosis present

## 2022-12-25 DIAGNOSIS — A419 Sepsis, unspecified organism: Secondary | ICD-10-CM | POA: Diagnosis not present

## 2022-12-25 DIAGNOSIS — Z825 Family history of asthma and other chronic lower respiratory diseases: Secondary | ICD-10-CM

## 2022-12-25 DIAGNOSIS — Z882 Allergy status to sulfonamides status: Secondary | ICD-10-CM

## 2022-12-25 DIAGNOSIS — R197 Diarrhea, unspecified: Secondary | ICD-10-CM | POA: Insufficient documentation

## 2022-12-25 DIAGNOSIS — K5900 Constipation, unspecified: Secondary | ICD-10-CM | POA: Diagnosis present

## 2022-12-25 DIAGNOSIS — Z85828 Personal history of other malignant neoplasm of skin: Secondary | ICD-10-CM

## 2022-12-25 DIAGNOSIS — E871 Hypo-osmolality and hyponatremia: Secondary | ICD-10-CM | POA: Diagnosis present

## 2022-12-25 DIAGNOSIS — E43 Unspecified severe protein-calorie malnutrition: Secondary | ICD-10-CM | POA: Insufficient documentation

## 2022-12-25 DIAGNOSIS — Z87891 Personal history of nicotine dependence: Secondary | ICD-10-CM

## 2022-12-25 DIAGNOSIS — Z6821 Body mass index (BMI) 21.0-21.9, adult: Secondary | ICD-10-CM

## 2022-12-25 DIAGNOSIS — I739 Peripheral vascular disease, unspecified: Secondary | ICD-10-CM | POA: Insufficient documentation

## 2022-12-25 DIAGNOSIS — Z90710 Acquired absence of both cervix and uterus: Secondary | ICD-10-CM

## 2022-12-25 DIAGNOSIS — I10 Essential (primary) hypertension: Secondary | ICD-10-CM | POA: Diagnosis present

## 2022-12-25 DIAGNOSIS — Z888 Allergy status to other drugs, medicaments and biological substances status: Secondary | ICD-10-CM

## 2022-12-25 DIAGNOSIS — K5732 Diverticulitis of large intestine without perforation or abscess without bleeding: Secondary | ICD-10-CM | POA: Diagnosis present

## 2022-12-25 DIAGNOSIS — R109 Unspecified abdominal pain: Secondary | ICD-10-CM

## 2022-12-25 DIAGNOSIS — Z90722 Acquired absence of ovaries, bilateral: Secondary | ICD-10-CM

## 2022-12-25 DIAGNOSIS — R112 Nausea with vomiting, unspecified: Secondary | ICD-10-CM | POA: Diagnosis present

## 2022-12-25 DIAGNOSIS — Z91040 Latex allergy status: Secondary | ICD-10-CM

## 2022-12-25 DIAGNOSIS — E119 Type 2 diabetes mellitus without complications: Secondary | ICD-10-CM

## 2022-12-25 DIAGNOSIS — Z9079 Acquired absence of other genital organ(s): Secondary | ICD-10-CM

## 2022-12-25 DIAGNOSIS — E1151 Type 2 diabetes mellitus with diabetic peripheral angiopathy without gangrene: Secondary | ICD-10-CM | POA: Diagnosis present

## 2022-12-25 LAB — COMPREHENSIVE METABOLIC PANEL
ALT: 14 U/L (ref 0–44)
AST: 14 U/L — ABNORMAL LOW (ref 15–41)
Albumin: 3.7 g/dL (ref 3.5–5.0)
Alkaline Phosphatase: 57 U/L (ref 38–126)
Anion gap: 11 (ref 5–15)
BUN: 20 mg/dL (ref 8–23)
CO2: 24 mmol/L (ref 22–32)
Calcium: 9 mg/dL (ref 8.9–10.3)
Chloride: 97 mmol/L — ABNORMAL LOW (ref 98–111)
Creatinine, Ser: 0.6 mg/dL (ref 0.44–1.00)
GFR, Estimated: >60 mL/min (ref >60)
Glucose, Bld: 181 mg/dL — ABNORMAL HIGH (ref 70–99)
Potassium: 4.1 mmol/L (ref 3.5–5.1)
Sodium: 132 mmol/L — ABNORMAL LOW (ref 135–145)
Total Bilirubin: 0.9 mg/dL (ref 0.3–1.2)
Total Protein: 6.7 g/dL (ref 6.5–8.1)

## 2022-12-25 LAB — CBC
HCT: 44.2 % (ref 36.0–46.0)
Hemoglobin: 14.8 g/dL (ref 12.0–15.0)
MCH: 28.4 pg (ref 26.0–34.0)
MCHC: 33.5 g/dL (ref 30.0–36.0)
MCV: 84.8 fL (ref 80.0–100.0)
Platelets: 267 10*3/uL (ref 150–400)
RBC: 5.21 MIL/uL — ABNORMAL HIGH (ref 3.87–5.11)
RDW: 13.2 % (ref 11.5–15.5)
WBC: 16.5 10*3/uL — ABNORMAL HIGH (ref 4.0–10.5)
nRBC: 0 % (ref 0.0–0.2)

## 2022-12-25 LAB — LIPASE, BLOOD: Lipase: 24 U/L (ref 11–51)

## 2022-12-25 MED ORDER — IOHEXOL 300 MG/ML  SOLN
75.0000 mL | Freq: Once | INTRAMUSCULAR | Status: AC | PRN
  Administered 2022-12-25: 75 mL via INTRAVENOUS

## 2022-12-25 NOTE — ED Provider Notes (Signed)
[facility] Provider Note    Event Date/Time   First MD Initiated Contact with Patient 12/25/22 2224     (approximate)  History   Chief Complaint: Abdominal Pain  HPI  Claire Washington is a 89 y.o. female with a past medical history of diabetes, hyperlipidemia, presents to the emergency department for lower abdominal pain.  According to the patient and record review from her discharge summary 12/15/2022 patient was admitted to the hospital for approximately 4 days for left lower quadrant abdominal pain found to be due to diverticulitis.  Patient was initially seen as an outpatient on 811 and placed on Augmentin for 10 days.  Had CT evidence of acute diverticulitis with abdominal pain thought to be due to diverticulitis and constipation.  Patient states since going home she has been feeling better however over the past 2 days she has began experiencing lower abdominal pain once again and states it is now worse at times and it was when she initially presented.  Patient denies any fever.  Does state nausea and vomiting today.  Patient states she has taken MiraLAX and has had several loose bowel movements denies constipation.  No black or bloody stool.  No urinary symptoms.  Physical Exam   Triage Vital Signs: ED Triage Vitals  Encounter Vitals Group     BP 12/25/22 2028 (!) 168/63     Systolic BP Percentile --      Diastolic BP Percentile --      Pulse Rate 12/25/22 2028 (!) 105     Resp 12/25/22 2028 20     Temp 12/25/22 2028 98.1 F (36.7 C)     Temp Source 12/25/22 2028 Oral     SpO2 12/25/22 2028 94 %     Weight 12/25/22 2026 108 lb (49 kg)     Height 12/25/22 2026 5' (1.524 m)     Head Circumference --      Peak Flow --      Pain Score 12/25/22 2026 6     Pain Loc --      Pain Education --      Exclude from Growth Chart --     Most recent vital signs: Vitals:   12/25/22 2028  BP: (!) 168/63  Pulse: (!) 105  Resp: 20  Temp: 98.1 F (36.7 C)   SpO2: 94%    General: Awake, no distress.  CV:  Good peripheral perfusion.  Regular rate and rhythm  Resp:  Normal effort.  Equal breath sounds bilaterally.  Abd:  No distention.  Soft, mild diffuse tenderness more so in the left lower quadrant.  No rebound or guarding.   ED Results / Procedures / Treatments   RADIOLOGY  CT pending   MEDICATIONS ORDERED IN ED: Medications - No data to display   IMPRESSION / MDM / ASSESSMENT AND PLAN / ED COURSE  I reviewed the triage vital signs and the nursing notes.  Patient's presentation is most consistent with acute presentation with potential threat to life or bodily function.  Patient presents the emergency department for worsening lower abdominal pain over the last 2 days.  Patient states positive nausea and vomiting as well.  Took MiraLAX and did have loose stool denies constipation.  No urinary symptoms.  Patient's lab work shows leukocytosis of 16,000 which is increased from prior results.  Chemistry shows no significant finding, normal LFTs and lipase.  Given the patient's lower abdominal pain leukocytosis and history of diverticulitis we will repeat  a CT scan abdomen and pelvis to further evaluate, to help rule out recurrent diverticulitis, perforation or abscess, or other intra-abdominal pathology.  FINAL CLINICAL IMPRESSION(S) / ED DIAGNOSES   Lower abdominal pain   Note:  This document was prepared using Dragon voice recognition software and may include unintentional dictation errors.   Minna Antis, MD 12/25/22 2231

## 2022-12-25 NOTE — ED Triage Notes (Addendum)
Pt presents to triage via [facility] with complaints of lower abdominal pain with associated N/V x2 days. Pt rates the pain 6/10 without radiation. A&Ox4 at this time. Denies hematochezia, constipation, diarrhea, CP or SOB.

## 2022-12-25 NOTE — ED Provider Notes (Signed)
11:50 PM  Assumed care at shift change.  Patient here with abdominal pain x 2 days with nausea and vomiting today.  History of recent diverticulitis requiring hospitalization.  Now has a leukocytosis of 16,000.  CT abdomen pelvis pending.  12:22 AM  Pt's CT scan reviewed and interpreted by myself and the radiologist.  Patient has moderate to markedly inflamed diverticuli within the sigmoid colon without evidence of perforation or abscess.  Will admit to the hospitalist for diverticulitis.  Will give IV Zosyn.   Paulette Rockford, Layla Maw, DO 12/26/22 0022

## 2022-12-25 NOTE — ED Notes (Signed)
First Nurse note: pt BIB ems from home with c/o abd pain x2 days, and 1-2 hrs of n/v.  Pt admitted at hospital recently and dx with diverticulitis. Pt otherwise A&O x4.  20g RFA  4 mg zofran  90% on RA, placed om 2L w/ems 90 HR

## 2022-12-26 ENCOUNTER — Encounter: Payer: Self-pay | Admitting: Family Medicine

## 2022-12-26 DIAGNOSIS — Z90722 Acquired absence of ovaries, bilateral: Secondary | ICD-10-CM | POA: Diagnosis not present

## 2022-12-26 DIAGNOSIS — Z888 Allergy status to other drugs, medicaments and biological substances status: Secondary | ICD-10-CM | POA: Diagnosis not present

## 2022-12-26 DIAGNOSIS — Z7902 Long term (current) use of antithrombotics/antiplatelets: Secondary | ICD-10-CM | POA: Diagnosis not present

## 2022-12-26 DIAGNOSIS — Z8673 Personal history of transient ischemic attack (TIA), and cerebral infarction without residual deficits: Secondary | ICD-10-CM | POA: Diagnosis not present

## 2022-12-26 DIAGNOSIS — I10 Essential (primary) hypertension: Secondary | ICD-10-CM

## 2022-12-26 DIAGNOSIS — E119 Type 2 diabetes mellitus without complications: Secondary | ICD-10-CM | POA: Diagnosis not present

## 2022-12-26 DIAGNOSIS — E43 Unspecified severe protein-calorie malnutrition: Secondary | ICD-10-CM | POA: Diagnosis present

## 2022-12-26 DIAGNOSIS — Z800 Family history of malignant neoplasm of digestive organs: Secondary | ICD-10-CM | POA: Diagnosis not present

## 2022-12-26 DIAGNOSIS — E785 Hyperlipidemia, unspecified: Secondary | ICD-10-CM | POA: Diagnosis present

## 2022-12-26 DIAGNOSIS — Z833 Family history of diabetes mellitus: Secondary | ICD-10-CM | POA: Diagnosis not present

## 2022-12-26 DIAGNOSIS — Z8042 Family history of malignant neoplasm of prostate: Secondary | ICD-10-CM | POA: Diagnosis not present

## 2022-12-26 DIAGNOSIS — Z87891 Personal history of nicotine dependence: Secondary | ICD-10-CM | POA: Diagnosis not present

## 2022-12-26 DIAGNOSIS — A419 Sepsis, unspecified organism: Secondary | ICD-10-CM

## 2022-12-26 DIAGNOSIS — Z823 Family history of stroke: Secondary | ICD-10-CM | POA: Diagnosis not present

## 2022-12-26 DIAGNOSIS — Z90710 Acquired absence of both cervix and uterus: Secondary | ICD-10-CM | POA: Diagnosis not present

## 2022-12-26 DIAGNOSIS — K5732 Diverticulitis of large intestine without perforation or abscess without bleeding: Secondary | ICD-10-CM | POA: Diagnosis present

## 2022-12-26 DIAGNOSIS — I739 Peripheral vascular disease, unspecified: Secondary | ICD-10-CM | POA: Insufficient documentation

## 2022-12-26 DIAGNOSIS — Z91040 Latex allergy status: Secondary | ICD-10-CM | POA: Diagnosis not present

## 2022-12-26 DIAGNOSIS — Z9079 Acquired absence of other genital organ(s): Secondary | ICD-10-CM | POA: Diagnosis not present

## 2022-12-26 DIAGNOSIS — E860 Dehydration: Secondary | ICD-10-CM | POA: Diagnosis present

## 2022-12-26 DIAGNOSIS — Z8249 Family history of ischemic heart disease and other diseases of the circulatory system: Secondary | ICD-10-CM | POA: Diagnosis not present

## 2022-12-26 DIAGNOSIS — K5792 Diverticulitis of intestine, part unspecified, without perforation or abscess without bleeding: Secondary | ICD-10-CM | POA: Diagnosis present

## 2022-12-26 DIAGNOSIS — Z882 Allergy status to sulfonamides status: Secondary | ICD-10-CM | POA: Diagnosis not present

## 2022-12-26 DIAGNOSIS — E871 Hypo-osmolality and hyponatremia: Secondary | ICD-10-CM | POA: Diagnosis present

## 2022-12-26 DIAGNOSIS — Z6821 Body mass index (BMI) 21.0-21.9, adult: Secondary | ICD-10-CM | POA: Diagnosis not present

## 2022-12-26 DIAGNOSIS — Z85828 Personal history of other malignant neoplasm of skin: Secondary | ICD-10-CM | POA: Diagnosis not present

## 2022-12-26 DIAGNOSIS — E1151 Type 2 diabetes mellitus with diabetic peripheral angiopathy without gangrene: Secondary | ICD-10-CM | POA: Diagnosis present

## 2022-12-26 LAB — URINALYSIS, ROUTINE W REFLEX MICROSCOPIC
Bilirubin Urine: NEGATIVE
Glucose, UA: NEGATIVE mg/dL
Hgb urine dipstick: NEGATIVE
Ketones, ur: 5 mg/dL — AB
Nitrite: NEGATIVE
Protein, ur: 30 mg/dL — AB
Specific Gravity, Urine: 1.021 (ref 1.005–1.030)
pH: 5 (ref 5.0–8.0)

## 2022-12-26 LAB — CBC
HCT: 40.3 % (ref 36.0–46.0)
Hemoglobin: 13.4 g/dL (ref 12.0–15.0)
MCH: 28 pg (ref 26.0–34.0)
MCHC: 33.3 g/dL (ref 30.0–36.0)
MCV: 84.3 fL (ref 80.0–100.0)
Platelets: 254 10*3/uL (ref 150–400)
RBC: 4.78 MIL/uL (ref 3.87–5.11)
RDW: 13.2 % (ref 11.5–15.5)
WBC: 12.8 10*3/uL — ABNORMAL HIGH (ref 4.0–10.5)
nRBC: 0 % (ref 0.0–0.2)

## 2022-12-26 LAB — BASIC METABOLIC PANEL
Anion gap: 6 (ref 5–15)
BUN: 16 mg/dL (ref 8–23)
CO2: 27 mmol/L (ref 22–32)
Calcium: 8.5 mg/dL — ABNORMAL LOW (ref 8.9–10.3)
Chloride: 102 mmol/L (ref 98–111)
Creatinine, Ser: 0.53 mg/dL (ref 0.44–1.00)
GFR, Estimated: >60 mL/min (ref >60)
Glucose, Bld: 109 mg/dL — ABNORMAL HIGH (ref 70–99)
Potassium: 4 mmol/L (ref 3.5–5.1)
Sodium: 135 mmol/L (ref 135–145)

## 2022-12-26 LAB — PROCALCITONIN: Procalcitonin: 0.35 ng/mL

## 2022-12-26 LAB — PROTIME-INR
INR: 1.2 (ref 0.8–1.2)
Prothrombin Time: 15 s (ref 11.4–15.2)

## 2022-12-26 LAB — CORTISOL-AM, BLOOD: Cortisol - AM: 15.8 ug/dL (ref 6.7–22.6)

## 2022-12-26 MED ORDER — ACETAMINOPHEN 325 MG PO TABS: 650.0000 mg | ORAL_TABLET | Freq: Four times a day (QID) | ORAL | Status: DC | PRN

## 2022-12-26 MED ORDER — TRAZODONE HCL 50 MG PO TABS
25.0000 mg | ORAL_TABLET | Freq: Every evening | ORAL | Status: DC | PRN
  Administered 2022-12-26 – 2022-12-27 (×2): 25 mg via ORAL
  Filled 2022-12-26 (×2): qty 1

## 2022-12-26 MED ORDER — MAGNESIUM HYDROXIDE 400 MG/5ML PO SUSP: 5.0000 mL | Freq: Every day | ORAL | Status: DC | PRN

## 2022-12-26 MED ORDER — VITAMIN D (ERGOCALCIFEROL) 1.25 MG (50000 UNIT) PO CAPS
50000.0000 [IU] | ORAL_CAPSULE | ORAL | Status: DC
  Administered 2022-12-29: 50000 [IU] via ORAL
  Filled 2022-12-26: qty 1

## 2022-12-26 MED ORDER — LACTATED RINGERS IV SOLN
150.0000 mL/h | INTRAVENOUS | Status: DC
  Administered 2022-12-26: 150 mL/h via INTRAVENOUS

## 2022-12-26 MED ORDER — ONDANSETRON HCL 4 MG PO TABS: 4.0000 mg | ORAL_TABLET | Freq: Four times a day (QID) | ORAL | Status: DC | PRN

## 2022-12-26 MED ORDER — SODIUM CHLORIDE 0.9 % IV SOLN: INTRAVENOUS | Status: DC | PRN

## 2022-12-26 MED ORDER — VITAMIN B-12 1000 MCG PO TABS
500.0000 ug | ORAL_TABLET | Freq: Every day | ORAL | Status: DC
  Administered 2022-12-26 – 2022-12-31 (×6): 500 ug via ORAL
  Filled 2022-12-26 (×6): qty 1

## 2022-12-26 MED ORDER — ENSURE ENLIVE PO LIQD
237.0000 mL | Freq: Two times a day (BID) | ORAL | Status: DC
  Administered 2022-12-26: 237 mL via ORAL

## 2022-12-26 MED ORDER — ADULT MULTIVITAMIN W/MINERALS CH
1.0000 | ORAL_TABLET | Freq: Every day | ORAL | Status: DC
  Administered 2022-12-26 – 2022-12-30 (×4): 1 via ORAL
  Filled 2022-12-26 (×6): qty 1

## 2022-12-26 MED ORDER — INFLUENZA VAC A&B SURF ANT ADJ 0.5 ML IM SUSY
0.5000 mL | PREFILLED_SYRINGE | INTRAMUSCULAR | Status: DC
  Filled 2022-12-26: qty 0.5

## 2022-12-26 MED ORDER — HYDRALAZINE HCL 25 MG PO TABS: 25.0000 mg | ORAL_TABLET | Freq: Three times a day (TID) | ORAL | Status: DC | PRN

## 2022-12-26 MED ORDER — METRONIDAZOLE 500 MG/100ML IV SOLN
500.0000 mg | Freq: Two times a day (BID) | INTRAVENOUS | Status: DC
  Administered 2022-12-26 – 2022-12-27 (×4): 500 mg via INTRAVENOUS
  Filled 2022-12-26 (×4): qty 100

## 2022-12-26 MED ORDER — PIPERACILLIN-TAZOBACTAM 3.375 G IVPB 30 MIN
3.3750 g | Freq: Once | INTRAVENOUS | Status: AC
  Administered 2022-12-26: 3.375 g via INTRAVENOUS
  Filled 2022-12-26: qty 50

## 2022-12-26 MED ORDER — ACETAMINOPHEN 650 MG RE SUPP: 650.0000 mg | Freq: Four times a day (QID) | RECTAL | Status: DC | PRN

## 2022-12-26 MED ORDER — SODIUM CHLORIDE 0.9 % IV SOLN
2.0000 g | INTRAVENOUS | Status: DC
  Administered 2022-12-26 – 2022-12-27 (×2): 2 g via INTRAVENOUS
  Filled 2022-12-26 (×2): qty 20

## 2022-12-26 MED ORDER — ONDANSETRON HCL 4 MG/2ML IJ SOLN: 4.0000 mg | Freq: Four times a day (QID) | INTRAMUSCULAR | Status: DC | PRN

## 2022-12-26 MED ORDER — MAGNESIUM HYDROXIDE 400 MG/5ML PO SUSP: 30.0000 mL | Freq: Every day | ORAL | Status: DC | PRN

## 2022-12-26 MED ORDER — POLYETHYLENE GLYCOL 3350 17 G PO PACK
17.0000 g | PACK | Freq: Every day | ORAL | Status: DC
  Administered 2022-12-26 – 2022-12-27 (×2): 17 g via ORAL
  Filled 2022-12-26 (×2): qty 1

## 2022-12-26 MED ORDER — POLYETHYLENE GLYCOL 3350 17 G PO PACK
17.0000 g | PACK | Freq: Once | ORAL | Status: AC
  Administered 2022-12-26: 17 g via ORAL
  Filled 2022-12-26: qty 1

## 2022-12-26 MED ORDER — ENSURE ENLIVE PO LIQD
237.0000 mL | Freq: Three times a day (TID) | ORAL | Status: DC
  Administered 2022-12-26 – 2022-12-31 (×7): 237 mL via ORAL

## 2022-12-26 MED ORDER — CLOPIDOGREL BISULFATE 75 MG PO TABS
75.0000 mg | ORAL_TABLET | Freq: Every day | ORAL | Status: DC
  Administered 2022-12-26 – 2022-12-30 (×6): 75 mg via ORAL
  Filled 2022-12-26 (×6): qty 1

## 2022-12-26 MED ORDER — ENOXAPARIN SODIUM 30 MG/0.3ML IJ SOSY
30.0000 mg | PREFILLED_SYRINGE | INTRAMUSCULAR | Status: DC
  Administered 2022-12-26 – 2022-12-31 (×6): 30 mg via SUBCUTANEOUS
  Filled 2022-12-26 (×6): qty 0.3

## 2022-12-26 MED ORDER — AMLODIPINE BESYLATE 5 MG PO TABS
2.5000 mg | ORAL_TABLET | Freq: Every day | ORAL | Status: DC
  Administered 2022-12-26: 2.5 mg via ORAL
  Filled 2022-12-26: qty 1

## 2022-12-26 NOTE — Assessment & Plan Note (Addendum)
Sodium normalized

## 2022-12-26 NOTE — Assessment & Plan Note (Signed)
Continue Norvasc

## 2022-12-26 NOTE — Progress Notes (Signed)
Initial Nutrition Assessment  DOCUMENTATION CODES:   Severe malnutrition in context of acute illness/injury  INTERVENTION:   Ensure Enlive po TID, each supplement provides 350 kcal and 20 grams of protein.  Magic cup TID with meals, each supplement provides 290 kcal and 9 grams of protein  MVI po daily   Pt at high refeed risk; recommend monitor potassium, magnesium and phosphorus labs daily until stable  Daily weights   NUTRITION DIAGNOSIS:   Severe Malnutrition related to acute illness as evidenced by moderate fat depletion, moderate muscle depletion, 7 percent weight loss in 2 weeks.  GOAL:   Patient will meet greater than or equal to 90% of their needs  MONITOR:   PO intake, Supplement acceptance, Labs, Weight trends, I & O's, Skin  REASON FOR ASSESSMENT:   Malnutrition Screening Tool    ASSESSMENT:   89 y/o female with h/o HTN, DM, HLD, stroke, SDH, diverticulosis and recent admission for acute diverticulitis and sepsis and who is now admitted with recurrent diverticulitis.  Met with patient in room today. Pt is well known to this RD from a recent previous admission. Pt reports continued poor appetite and oral intake since her last discharge. Pt reports that she has been trying to eat as much as she can but reports her appetite is poor. Pt reports that she continues to drink the Premier Protein at home. Pt reports that she has been avoiding fiber. Per chart, pt is down 9lbs(7%) over the past two weeks; this is significant weight loss. Pt with increased fat and muscle depletions on exam today and meets criteria for malnutrition. RD discussed with pt the importance of adequate nutrition needed to preserve lean muscle. RD will add supplements to help pt meet her estimated needs. Discussed ways of adding extra calories and protein to meals. Pt is at high refeed risk.   Medications reviewed and include: plavix, B12, lovenox, MVI, miralax, vitamin D, ceftriaxone,  metronidazole  Labs reviewed: K 4.0 wnl, Wbc- 12.8(H)  NUTRITION - FOCUSED PHYSICAL EXAM:  Flowsheet Row Most Recent Value  Orbital Region Moderate depletion  Upper Arm Region Mild depletion  Thoracic and Lumbar Region Mild depletion  Buccal Region Moderate depletion  Temple Region Moderate depletion  Clavicle Bone Region Severe depletion  Clavicle and Acromion Bone Region Severe depletion  Scapular Bone Region Mild depletion  Dorsal Hand Moderate depletion  Patellar Region Moderate depletion  Anterior Thigh Region Moderate depletion  Posterior Calf Region Moderate depletion  Edema (RD Assessment) None  Hair Reviewed  Eyes Reviewed  Mouth Reviewed  Skin Reviewed  Nails Reviewed   Diet Order:   Diet Order             DIET SOFT Room service appropriate? Yes; Fluid consistency: Thin  Diet effective now                  EDUCATION NEEDS:   Education needs have been addressed  Skin:  Skin Assessment: Reviewed RN Assessment (ecchymosis)  Last BM:  9/10- type 7  Height:   Ht Readings from Last 1 Encounters:  12/25/22 5' (1.524 m)    Weight:   Wt Readings from Last 1 Encounters:  12/26/22 49.7 kg    Ideal Body Weight:  45.45 kg  BMI:  Body mass index is 21.4 kg/m.  Estimated Nutritional Needs:   Kcal:  1200-1400kcal/day  Protein:  60-70g/day  Fluid:  1.2-1.4L/day  Betsey Holiday MS, RD, LDN Please refer to [facility] for RD and/or RD on-call/weekend/after hours  pager

## 2022-12-26 NOTE — Evaluation (Signed)
Physical Therapy Evaluation Patient Details Name: Claire Washington MRN: [id number] DOB: January 20, 1925 Today's Date: 12/26/2022  History of Present Illness  Pt is a 89 y.o. female with medical history significant for type diabetes mellitus, hypertension, dyslipidemia and other medical problems as mentioned below, who presented to the emergency room with acute onset of recurrent nausea and vomiting with associated lower abdominal pain mainly in the left lower quadrant with flatulence.  She denies any fever or chills.  No chest pain or dyspnea or palpitations or cough.  No dysuria, oliguria or hematuria or flank pain.  Clinical Impression   Pt is received in bed, she is agreeable to PT session. Pt performs bed mobility sup A and transfer CGA for safety. Pt reports attempting to walk to bathroom earlier today using RW but was unable to secondary to LE weakness. Pt able to step pivot transfer from bed to recliner using RW and frequent cuing for technique and safety. Slight reports of dizziness upon standing but Pt reports that "is normal for me". Unable to progress mobility at this time due to Pt request and increased fatigue following transfer. Pt would benefit from skilled PT to address above deficits and promote optimal return to PLOF.         If plan is discharge home, recommend the following: A little help with walking and/or transfers;A little help with bathing/dressing/bathroom;Assistance with cooking/housework;Assist for transportation   Can travel by private vehicle        Equipment Recommendations None recommended by PT  Recommendations for Other Services       Functional Status Assessment Patient has had a recent decline in their functional status and demonstrates the ability to make significant improvements in function in a reasonable and predictable amount of time.     Precautions / Restrictions Precautions Precautions: Fall Restrictions Weight Bearing Restrictions: No       Mobility  Bed Mobility Overal bed mobility: Needs Assistance Bed Mobility: Supine to Sit     Supine to sit: Supervision, HOB elevated, Used rails     General bed mobility comments: Able to perform bed mobility to EOB but reports slight onset of dizziness adding that at baseline she gets slight dizziness    Transfers Overall transfer level: Needs assistance Equipment used: Rolling walker (2 wheels) Transfers: Bed to chair/wheelchair/BSC     Step pivot transfers: Contact guard assist       General transfer comment: CGA for safety with min cuing for imporved technique for additional safety    Ambulation/Gait               General Gait Details: Not assessed at this time due to Pt reports increased fatigue  Stairs            Wheelchair Mobility     Tilt Bed    Modified Rankin (Stroke Patients Only)       Balance Overall balance assessment: Needs assistance Sitting-balance support: Feet supported Sitting balance-Leahy Scale: Good Sitting balance - Comments: Able to maintain seated EOB balance during functional activities   Standing balance support: Bilateral upper extremity supported, During functional activity Standing balance-Leahy Scale: Fair Standing balance comment: Slight shuffling gait during step pivot transfer into recliner; cuing for foot clearance for safety                             Pertinent Vitals/Pain Pain Assessment Pain Assessment: No/denies pain    Home Living Family/patient expects to  be discharged to:: Private residence Living Arrangements: Alone Available Help at Discharge: Family Type of Home: Apartment Home Access: Level entry       Home Layout: One level Home Equipment: Rollator (4 wheels);Wheelchair - manual Additional Comments: Pt report beginning OT/PT but then got admitted to hospital    Prior Function Prior Level of Function : Independent/Modified Independent             Mobility Comments:  using 4 wheeled walker and wheelchair for prolonged activities ADLs Comments: Cooks for herself, has someone to assist with cleaning, [employer] for grocery delivery     Extremity/Trunk Assessment   Upper Extremity Assessment Upper Extremity Assessment: Generalized weakness    Lower Extremity Assessment Lower Extremity Assessment: Generalized weakness       Communication   Communication Communication: No apparent difficulties Cueing Techniques: Verbal cues  Cognition Arousal: Alert Behavior During Therapy: WFL for tasks assessed/performed Overall Cognitive Status: Within Functional Limits for tasks assessed                                 General Comments: Pt is A and cooperative. Seems to be good historian of past medical history.        General Comments      Exercises     Assessment/Plan    PT Assessment Patient needs continued PT services  PT Problem List Decreased strength;Decreased activity tolerance;Decreased balance;Decreased mobility;Decreased safety awareness       PT Treatment Interventions DME instruction;Gait training;Functional mobility training;Therapeutic activities;Therapeutic exercise;Neuromuscular re-education;Balance training;Patient/family education    PT Goals (Current goals can be found in the Care Plan section)  Acute Rehab PT Goals Patient Stated Goal: to go home PT Goal Formulation: With patient Time For Goal Achievement: 01/09/23 Potential to Achieve Goals: Good    Frequency Min 1X/week     Co-evaluation               AM-PAC PT "6 Clicks" Mobility  Outcome Measure Help needed turning from your back to your side while in a flat bed without using bedrails?: None Help needed moving from lying on your back to sitting on the side of a flat bed without using bedrails?: None Help needed moving to and from a bed to a chair (including a wheelchair)?: A Little Help needed standing up from a chair using your arms (e.g.,  wheelchair or bedside chair)?: A Little Help needed to walk in hospital room?: A Little Help needed climbing 3-5 steps with a railing? : A Little 6 Click Score: 20    End of Session   Activity Tolerance: Patient limited by fatigue Patient left: in chair;with call bell/phone within reach Nurse Communication: Mobility status PT Visit Diagnosis: Unsteadiness on feet (R26.81);Muscle weakness (generalized) (M62.81);Difficulty in walking, not elsewhere classified (R26.2)    Time: 3244-0102 PT Time Calculation (min) (ACUTE ONLY): 10 min   Charges:                 Elmon Else, SPT   Meila Berke 12/26/2022, 2:34 PM

## 2022-12-26 NOTE — H&P (Signed)
[place]   PATIENT NAME: Claire Washington    MR#:  [id number]  DATE OF BIRTH:  [birth year]  DATE OF ADMISSION:  12/25/2022  PRIMARY CARE PHYSICIAN: Barbette Reichmann, MD   Patient is coming from: Home  REQUESTING/REFERRING PHYSICIAN: Ward, Layla Maw, DO  CHIEF COMPLAINT:   Chief Complaint  Patient presents with   Abdominal Pain    HISTORY OF PRESENT ILLNESS:  Claire Washington is a 89 y.o. female with medical history significant for type diabetes mellitus, hypertension, dyslipidemia and other medical problems as mentioned below, who presented to the emergency room with acute onset of recurrent nausea and vomiting with associated lower abdominal pain mainly in the left lower quadrant with flatulence.  She denies any fever or chills.  No chest pain or dyspnea or palpitations or cough.  No dysuria, oliguria or hematuria or flank pain.  ED Course: When she came to the ER, BP was 168/63 with a heart rate of 105 with otherwise normal vital signs.  Labs revealed blood glucose of 181 with sodium 132 and chloride 97 and otherwise unremarkable CMP.  CBC showed leukocytosis 16.5. EKG as reviewed by me : None. Imaging: Abdominal pelvic CT scan revealed sigmoid diverticulitis without abscess or perforation.  The patient was given IV Zosyn.  She will be admitted to a surgical telemetry bed for further evaluation and management. PAST MEDICAL HISTORY:   Past Medical History:  Diagnosis Date   Arthritis    Basal cell carcinoma    basel cell   Chronic cystitis    Diabetes mellitus type 2, controlled, without complications (HCC)    type 2 or unspecified type diabetes mellitus without mention of complication, not stated as uncontrolled   Disease of thyroid gland    Diverticulitis    Embolic stroke (HCC)    a. 07/2016 MRI Brain: small right superior cerebellum and left lateral occipital cortex infarcts;  b. 08/2016 Echo: EF 60-65%, Gr1 DD, mild MR;  c. 08/2016 30 Day Event Monitor: No  significant tachy/bradyarrhythmias. No afib.   Follicular neoplasm of thyroid    Hemorrhage of rectum and anus    Hyperlipidemia    Hyperlipidemia, unspecified    Hypertension    Leg pain    Osteoarthritis    Pernicious anemia    Small vessel disease (HCC)    Spinal stenosis, lumbar region without neurogenic claudication    Subdural hematoma (HCC)    a. 2013 in setting of syncope.   Syncope and collapse    a. 2013 - syncope and fall w/ resultant subdural hematoma; b. prev nl echo and stress test @ [facility].   Thyroid nodule    Vertigo & Chronic Dizziness    a. Previously seen by ENT with Physical Therapy.  MRI 2000 small vessel disease    PAST SURGICAL HISTORY:   Past Surgical History:  Procedure Laterality Date   BILATERAL SALPINGOOPHORECTOMY     BREAST CYST EXCISION     ? side, yrs ago   COLONOSCOPY  01/26/2009   Diverticulosis   CYSTOSCOPY     HEMORRHOIDECTOMY BY SIMPLE LIGATION     PR COLONOSCOPY FLX W/ENDOSCOPIC MUCOSAL RESECTION  09/02/2014   Procedure: COLONOSCOPY, FLEXIBLE; WITH ENDOSCOPIC MUCOSAL RESECTION; Surgeon: Mayford Knife, MD; [facility]; Service: Gastroenterology   PR COLSC FLX W/RMVL OF TUMOR POLYP LESION SNARE TQ  09/02/2014   Procedure: COLONOSCOPY FLEX; W/REMOV TUMOR/LES BY SNARE; Surgeon: Mayford Knife, MD; [facility];  Service: Gastroenterology   SKIN SURGERY     TOTAL ABDOMINAL HYSTERECTOMY W/ BILATERAL SALPINGOOPHORECTOMY  1970s    SOCIAL HISTORY:   Social History   Tobacco Use   Smoking status: Former    Current packs/day: 0.00    Average packs/day: 0.3 packs/day for 20.0 years (5.0 ttl pk-yrs)    Types: Cigarettes    Start date: 09/15/1973    Quit date: 09/15/1993    Years since quitting: 29.2   Smokeless tobacco: Never  Substance Use Topics   Alcohol use: Yes    Alcohol/week: 2.0 standard drinks of alcohol    Types: 1 Glasses of wine, 1 Shots of liquor per week    Comment:  occassional    FAMILY HISTORY:   Family History  Problem Relation Age of Onset   Colon cancer Mother    Heart disease Father    Heart attack Father    Prostate cancer Father    Stroke Father    Heart attack Brother    Asthma Daughter    Diabetes Other        Siblings   Breast cancer Neg Hx     DRUG ALLERGIES:   Allergies  Allergen Reactions   Latex Dermatitis   Hydrocortisone Swelling   Sulfa Antibiotics Other (See Comments)    Reaction: unknown Other reaction(s): Not available   Tape Dermatitis    REVIEW OF SYSTEMS:   ROS As per history of present illness. All pertinent systems were reviewed above. Constitutional, HEENT, cardiovascular, respiratory, GI, GU, musculoskeletal, neuro, psychiatric, endocrine, integumentary and hematologic systems were reviewed and are otherwise negative/unremarkable except for positive findings mentioned above in the HPI.   MEDICATIONS AT HOME:   Prior to Admission medications   Medication Sig Start Date End Date Taking? Authorizing Provider  acetaminophen (TYLENOL) 325 MG tablet Take 2 tablets (650 mg total) by mouth every 6 (six) hours as needed for mild pain, moderate pain, fever or headache (or Fever >/= 101). 01/18/22  Yes Esaw Grandchild A, DO  amLODipine (NORVASC) 2.5 MG tablet Take 1 tablet (2.5 mg total) by mouth every evening. Patient taking differently: Take 2.5 mg by mouth at bedtime. 06/27/22 12/26/22 Yes Gollan, Tollie Pizza, MD  amLODipine (NORVASC) 5 MG tablet Take 5 mg by mouth at bedtime.   Yes [provider]  clopidogrel (PLAVIX) 75 MG tablet TAKE 1 TABLET BY MOUTH EVERY DAY Patient taking differently: Take 75 mg by mouth at bedtime. 08/11/22  Yes Gollan, Tollie Pizza, MD  hydrALAZINE (APRESOLINE) 25 MG tablet TAKE 1 TABLET BY MOUTH 3 TIMES DAILY AS NEEDED FOR SYSTOLIC BLOOD PRESSURE >160 11/29/21  Yes Gollan, Tollie Pizza, MD  magnesium hydroxide (MILK OF MAGNESIA) 400 MG/5ML suspension Take 5 mLs by mouth daily as needed  for mild constipation.   Yes [provider]  Multiple Vitamins-Minerals (CENTRUM SILVER PO) Take 1 tablet by mouth daily. Reported on 06/11/2015   Yes [provider]  polyethylene glycol (MIRALAX / GLYCOLAX) 17 g packet Take 17 g by mouth daily.   Yes [provider]  vitamin B-12 (CYANOCOBALAMIN) 1000 MCG tablet Take 500 mcg by mouth daily.   Yes [provider]  Vitamin D, Ergocalciferol, (DRISDOL) 1.25 MG (50000 UT) CAPS capsule Take 50,000 Units by mouth every 7 (seven) days.   Yes [provider]  feeding supplement (ENSURE ENLIVE / ENSURE PLUS) LIQD Take 237 mLs by mouth 2 (two) times daily between meals. 12/15/22   Darlin Priestly, MD  VITAL SIGNS:  Blood pressure (!) 130/42, pulse 79, temperature 97.8 F (36.6 C), temperature source Oral, resp. rate (!) 22, height 5' (1.524 m), weight 49 kg, SpO2 94%.  PHYSICAL EXAMINATION:  Physical Exam  GENERAL:  89 y.o.-year-old patient lying in the bed with no acute distress.  EYES: Pupils equal, round, reactive to light and accommodation. No scleral icterus. Extraocular muscles intact.  HEENT: Head atraumatic, normocephalic. Oropharynx and nasopharynx clear.  NECK:  Supple, no jugular venous distention. No thyroid enlargement, no tenderness.  LUNGS: Normal breath sounds bilaterally, no wheezing, rales,rhonchi or crepitation. No use of accessory muscles of respiration.  CARDIOVASCULAR: Regular rate and rhythm, S1, S2 normal. No murmurs, rubs, or gallops.  ABDOMEN: Soft, nondistended, with lower abdominal tenderness mainly in the left lower quadrant without rebound tenderness guarding or rigidity.. Bowel sounds present. No organomegaly or mass.  EXTREMITIES: No pedal edema, cyanosis, or clubbing.  NEUROLOGIC: Cranial nerves II through XII are intact. Muscle strength 5/5 in all extremities. Sensation intact. Gait not checked.  PSYCHIATRIC: The patient is alert and oriented x 3.  Normal affect and good eye  contact. SKIN: No obvious rash, lesion, or ulcer.   LABORATORY PANEL:   CBC Recent Labs  Lab 12/26/22 0520  WBC 12.8*  HGB 13.4  HCT 40.3  PLT 254   ------------------------------------------------------------------------------------------------------------------  Chemistries  Recent Labs  Lab 12/25/22 2026 12/26/22 0520  NA 132* 135  K 4.1 4.0  CL 97* 102  CO2 24 27  GLUCOSE 181* 109*  BUN 20 16  CREATININE 0.60 0.53  CALCIUM 9.0 8.5*  AST 14*  --   ALT 14  --   ALKPHOS 57  --   BILITOT 0.9  --    ------------------------------------------------------------------------------------------------------------------  Cardiac Enzymes No results for input(s): "TROPONINI" in the last 168 hours. ------------------------------------------------------------------------------------------------------------------  RADIOLOGY:  CT ABDOMEN PELVIS W CONTRAST  Result Date: 12/26/2022 CLINICAL DATA:  Lower abdominal pain. EXAM: CT ABDOMEN AND PELVIS WITH CONTRAST TECHNIQUE: Multidetector CT imaging of the abdomen and pelvis was performed using the standard protocol following bolus administration of intravenous contrast. RADIATION DOSE REDUCTION: This exam was performed according to the departmental dose-optimization program which includes automated exposure control, adjustment of the mA and/or kV according to patient size and/or use of iterative reconstruction technique. CONTRAST:  75mL OMNIPAQUE IOHEXOL 300 MG/ML  SOLN COMPARISON:  December 10, 2022 FINDINGS: Lower chest: No acute abnormality. Hepatobiliary: No focal liver abnormality is seen. A solitary 1.9 cm gallstone is seen within the lumen of an otherwise normal-appearing gallbladder. Pancreas: A subcentimeter simple cyst is seen within the pancreatic tail. No pancreatic ductal dilatation or surrounding inflammatory changes. Spleen: Normal in size without focal abnormality. Adrenals/Urinary Tract: Adrenal glands are unremarkable. Kidneys  are normal in size, without renal calculi or hydronephrosis. Multiple subcentimeter bilateral simple renal cysts are seen. Bladder is unremarkable. Stomach/Bowel: Stomach is within normal limits. Appendix appears normal. No evidence of bowel dilatation. Moderate to markedly inflamed diverticula are seen within the mid sigmoid colon. There is no evidence of associated perforation or abscess. Vascular/Lymphatic: Aortic atherosclerosis. No enlarged abdominal or pelvic lymph nodes. Reproductive: Status post hysterectomy. No adnexal masses. Other: No abdominal wall hernia or abnormality. No abdominopelvic ascites. Musculoskeletal: Multilevel degenerative changes are seen throughout the lumbar spine Electronically Signed   By: Aram Candela M.D.   On: 12/26/2022 00:12      IMPRESSION AND PLAN:  Assessment and Plan: * Sepsis due to undetermined organism [facility]) - This is secondary to acute diverticulitis. -  It is manifested by leukocytosis and tachypnea. - She will be admitted to the surgical telemetry bed. - We will continue antibiotic therapy with IV Rocephin and Flagyl. - Pain management will be provided.  Sigmoid diverticulitis - Management as above.  Hyponatremia - It is likely hypovolemic due to dehydration. - Will hydrate with IV normal saline and follow sodium level.  Type 2 diabetes mellitus without complications (HCC) - The patient will be placed on supplemental coverage with NovoLog.  PAD (peripheral artery disease) (HCC) - We will continue Plavix.  Essential hypertension - We will continue antihypertensive therapy.       DVT prophylaxis: Lovenox.  Advanced Care Planning:  Code Status: full code.  This was discussed with her. Family Communication:  The plan of care was discussed in details with the patient (and family). I answered all questions. The patient agreed to proceed with the above mentioned plan. Further management will depend upon hospital course. Disposition  Plan: Back to previous home environment Consults called: none.  All the records are reviewed and case discussed with ED provider.  Status is: Inpatient    At the time of the admission, it appears that the appropriate admission status for this patient is inpatient.  This is judged to be reasonable and necessary in order to provide the required intensity of service to ensure the patient's safety given the presenting symptoms, physical exam findings and initial radiographic and laboratory data in the context of comorbid conditions.  The patient requires inpatient status due to high intensity of service, high risk of further deterioration and high frequency of surveillance required.  I certify that at the time of admission, it is my clinical judgment that the patient will require inpatient hospital care extending more than 2 midnights.                            Dispo: The patient is from: Home              Anticipated d/c is to: Home              Patient currently is not medically stable to d/c.              Difficult to place patient: No  Hannah Beat M.D on 12/26/2022 at 6:36 AM  [facility]   From 7 PM-7 AM, contact night-coverage [email or web address]  CC: Primary care physician; Barbette Reichmann, MD

## 2022-12-26 NOTE — Assessment & Plan Note (Addendum)
Zosyn switched over to Augmentin liquid.

## 2022-12-26 NOTE — Progress Notes (Signed)
This is a nonbillable note. Patient is seen and examined, currently doing well with intermittent lower abdominal pain.  Has some tenderness in the left lower quadrant. Currently on Rocephin and Flagyl. Anticipating discharge in 48 hours.

## 2022-12-26 NOTE — Assessment & Plan Note (Addendum)
hemoglobin A1c is 5.7.

## 2022-12-26 NOTE — Assessment & Plan Note (Addendum)
Present on admission with acute sigmoid diverticulitis, leukocytosis and tachypnea.  Switch Zosyn over to Augmentin liquid upon discharge and complete another 9 days.

## 2022-12-26 NOTE — Assessment & Plan Note (Signed)
-   We will continue Plavix. 

## 2022-12-26 NOTE — ED Notes (Signed)
Patient c/o nausea and vomiting

## 2022-12-26 NOTE — Progress Notes (Signed)
Anticoagulation monitoring(Lovenox):  89 yo female ordered Lovenox 40 mg Q24h    Filed Weights   12/25/22 2026  Weight: 49 kg (108 lb)   BMI 21   Lab Results  Component Value Date   CREATININE 0.60 12/25/2022   CREATININE 0.45 12/15/2022   CREATININE 0.54 12/12/2022   Estimated Creatinine Clearance: 29.5 mL/min (by C-G formula based on SCr of 0.6 mg/dL). Hemoglobin & Hematocrit     Component Value Date/Time   HGB 14.8 12/25/2022 2026   HGB 15.2 07/26/2013 1025   HCT 44.2 12/25/2022 2026   HCT 47.7 (H) 07/26/2013 1025     Per Protocol for Patient with estCrcl < 30 ml/min and BMI < 30, will transition to Lovenox 30 mg Q24h.

## 2022-12-27 DIAGNOSIS — E871 Hypo-osmolality and hyponatremia: Secondary | ICD-10-CM | POA: Diagnosis not present

## 2022-12-27 DIAGNOSIS — I739 Peripheral vascular disease, unspecified: Secondary | ICD-10-CM

## 2022-12-27 DIAGNOSIS — E119 Type 2 diabetes mellitus without complications: Secondary | ICD-10-CM | POA: Diagnosis not present

## 2022-12-27 DIAGNOSIS — K5732 Diverticulitis of large intestine without perforation or abscess without bleeding: Secondary | ICD-10-CM | POA: Diagnosis not present

## 2022-12-27 DIAGNOSIS — K5909 Other constipation: Secondary | ICD-10-CM

## 2022-12-27 DIAGNOSIS — E43 Unspecified severe protein-calorie malnutrition: Secondary | ICD-10-CM

## 2022-12-27 DIAGNOSIS — K5900 Constipation, unspecified: Secondary | ICD-10-CM | POA: Insufficient documentation

## 2022-12-27 DIAGNOSIS — A419 Sepsis, unspecified organism: Secondary | ICD-10-CM | POA: Diagnosis not present

## 2022-12-27 LAB — GLUCOSE, CAPILLARY
Glucose-Capillary: 166 mg/dL — ABNORMAL HIGH (ref 70–99)
Glucose-Capillary: 173 mg/dL — ABNORMAL HIGH (ref 70–99)

## 2022-12-27 MED ORDER — PIPERACILLIN-TAZOBACTAM 3.375 G IVPB
3.3750 g | Freq: Three times a day (TID) | INTRAVENOUS | Status: DC
  Administered 2022-12-27 – 2022-12-31 (×12): 3.375 g via INTRAVENOUS
  Filled 2022-12-27 (×11): qty 50

## 2022-12-27 MED ORDER — AMLODIPINE BESYLATE 5 MG PO TABS
7.5000 mg | ORAL_TABLET | Freq: Every day | ORAL | Status: DC
  Administered 2022-12-27 – 2022-12-30 (×4): 7.5 mg via ORAL
  Filled 2022-12-27 (×4): qty 2

## 2022-12-27 MED ORDER — LACTULOSE 10 GM/15ML PO SOLN
20.0000 g | Freq: Once | ORAL | Status: DC
  Filled 2022-12-27: qty 30

## 2022-12-27 MED ORDER — INSULIN ASPART 100 UNIT/ML IJ SOLN: 0.0000 [IU] | Freq: Every day | INTRAMUSCULAR | Status: DC

## 2022-12-27 MED ORDER — INSULIN ASPART 100 UNIT/ML IJ SOLN
0.0000 [IU] | Freq: Three times a day (TID) | INTRAMUSCULAR | Status: DC
  Administered 2022-12-27 – 2022-12-29 (×3): 1 [IU] via SUBCUTANEOUS
  Filled 2022-12-27 (×3): qty 1

## 2022-12-27 MED ORDER — POLYETHYLENE GLYCOL 3350 17 G PO PACK
17.0000 g | PACK | Freq: Two times a day (BID) | ORAL | Status: DC
  Administered 2022-12-28 (×2): 17 g via ORAL
  Filled 2022-12-27 (×3): qty 1

## 2022-12-27 NOTE — Progress Notes (Signed)
Pharmacy Antibiotic Note  Claire Washington is a 89 y.o. female w/ PMH of HTN, DM, PVD, HLD, SDH admitted on 12/25/2022 with diverticulitis.  Pharmacy has been consulted for Zosyn dosing.  Plan: start Zosyn 3.375g IV q8h (4 hour infusion). ---follow renal function for needed dose adjustments  Height: 5' (152.4 cm) Weight: 48.6 kg (107 lb 2.3 oz) IBW/kg (Calculated) : 45.5  Temp (24hrs), Avg:98 F (36.7 C), Min:97.6 F (36.4 C), Max:98.3 F (36.8 C)  Recent Labs  Lab 12/25/22 2026 12/26/22 0520  WBC 16.5* 12.8*  CREATININE 0.60 0.53    Estimated Creatinine Clearance: 29.5 mL/min (by C-G formula based on SCr of 0.53 mg/dL).    Allergies  Allergen Reactions   Latex Dermatitis   Hydrocortisone Swelling   Sulfa Antibiotics Other (See Comments)    Reaction: unknown Other reaction(s): Not available   Tape Dermatitis    Antimicrobials this admission: 09/10 ceftriaxone >> 09/11 09/10 metronidazole >> 09/11 09/11 Zosyn >>   Microbiology results: none to date  Thank you for allowing pharmacy to be a part of this patient's care.  Lowella Bandy 12/27/2022 2:48 PM

## 2022-12-27 NOTE — Assessment & Plan Note (Deleted)
Continue MiraLAX daily

## 2022-12-27 NOTE — Progress Notes (Signed)
       CROSS COVER NOTE  NAME: Claire Washington MRN: [id number] DOB : 1924/12/08    Concern as stated by nurse / staff     Pt admitted on 9/10 for sepsis relating to "an undetermined organism" She was here 2 weeks ago for diverticulitis and it is thought that it might still be the source of the infection. She is currently on Zosyn. Anyway the Pt just advised me that she takes 7.5mg  of Amlodipine at night not 2.5mg  as prescribed. VS: BP 150/66, MAP 92, T 98.3, HR 83, RR 18, SpaO2 96% RA. She is A&O x4. I explained that during hospital stays the providers will change dosages as they see fit. Hx of sigmoid diverticulitis, hyponatremia, DM2, HTN, PAD, constipation. She is very particular and set in her ways (she is 89 y/o). Her BP isn't all that high - you think 7.5mg  is too much? Thanks.    Pertinent findings on chart review:   Assessment and  Interventions   Assessment:    12/27/2022    8:23 PM 12/27/2022    4:42 PM 12/27/2022    5:07 AM  Vitals with BMI  Systolic 150 160 409  Diastolic 66 55 62  Pulse 83 79 80    Plan: dose confirmed with pharmacy and ordered as at home dose 78.5 mg       Donnie Mesa NP [facility] Cover 7pm-7am - check amion for availability Pager [phone]

## 2022-12-27 NOTE — Evaluation (Signed)
Occupational Therapy Evaluation Patient Details Name: Claire Washington MRN: [id number] DOB: 23-May-1924 Today's Date: 12/27/2022   History of Present Illness Pt is a 89 y.o. female with medical history significant for type diabetes mellitus, hypertension, dyslipidemia and other medical problems as mentioned below, who presented to the emergency room with acute onset of recurrent nausea and vomiting with associated lower abdominal pain mainly in the left lower quadrant with flatulence.  She denies any fever or chills.  No chest pain or dyspnea or palpitations or cough.  No dysuria, oliguria or hematuria or flank pain.   Clinical Impression   Ms Bobinski was seen for OT evaluation this date. Prior to hospital admission, pt was MOD I for mobility using rollator. Pt lives alone in ILF apartment, assist for cleaning. Pt currently requires SUPERVISION don B socks seated EOB. SBA + RW simulated toilet t/f and standing grooming tasks. Pt would benefit from skilled OT to address noted impairments and functional limitations (see below for any additional details). Upon hospital discharge, recommend OT follow up.    If plan is discharge home, recommend the following: A little help with walking and/or transfers;A little help with bathing/dressing/bathroom;Help with stairs or ramp for entrance    Functional Status Assessment  Patient has had a recent decline in their functional status and demonstrates the ability to make significant improvements in function in a reasonable and predictable amount of time.  Equipment Recommendations  BSC/3in1    Recommendations for Other Services       Precautions / Restrictions Precautions Precautions: Fall Restrictions Weight Bearing Restrictions: No      Mobility Bed Mobility Overal bed mobility: Needs Assistance Bed Mobility: Supine to Sit     Supine to sit: Min assist          Transfers Overall transfer level: Needs assistance Equipment used: Rolling  walker (2 wheels) Transfers: Sit to/from Stand Sit to Stand: Contact guard assist                  Balance Overall balance assessment: Needs assistance Sitting-balance support: Feet supported Sitting balance-Leahy Scale: Good     Standing balance support: No upper extremity supported, During functional activity Standing balance-Leahy Scale: Fair                             ADL either performed or assessed with clinical judgement   ADL Overall ADL's : Needs assistance/impaired                                       General ADL Comments: SUPERVISION don B socks seated EOB. SBA + RW simulated toilet t/f and standing grooming tasks      Pertinent Vitals/Pain Pain Assessment Pain Assessment: Faces Faces Pain Scale: Hurts little more Pain Location: abdomen Pain Descriptors / Indicators: Discomfort, Dull Pain Intervention(s): Limited activity within patient's tolerance     Extremity/Trunk Assessment Upper Extremity Assessment Upper Extremity Assessment: Overall WFL for tasks assessed   Lower Extremity Assessment Lower Extremity Assessment: Generalized weakness       Communication Communication Communication: Hearing impairment Cueing Techniques: Verbal cues   Cognition Arousal: Alert Behavior During Therapy: WFL for tasks assessed/performed Overall Cognitive Status: Within Functional Limits for tasks assessed  Home Living Family/patient expects to be discharged to:: Private residence Living Arrangements: Alone Available Help at Discharge: Family Type of Home: Apartment Home Access: Level entry     Home Layout: One level     Bathroom Shower/Tub: Runner, broadcasting/film/video: Rollator (4 wheels)          Prior Functioning/Environment Prior Level of Function : Independent/Modified Independent             Mobility Comments: sits on  rollator when she gets tired to complete IADLs ADLs Comments: Cooks for herself, has someone to assist with cleaning, [employer] for grocery delivery        OT Problem List: Decreased strength;Decreased range of motion;Decreased activity tolerance;Impaired balance (sitting and/or standing)      OT Treatment/Interventions: Self-care/ADL training;Therapeutic exercise;Energy conservation;DME and/or AE instruction;Therapeutic activities;Balance training;Patient/family education    OT Goals(Current goals can be found in the care plan section) Acute Rehab OT Goals Patient Stated Goal: to go home and not come back OT Goal Formulation: With patient Time For Goal Achievement: 01/10/23 Potential to Achieve Goals: Good ADL Goals Pt Will Perform Grooming: with modified independence;standing Pt Will Perform Lower Body Dressing: with modified independence;sit to/from stand Pt Will Transfer to Toilet: with modified independence;ambulating;regular height toilet  OT Frequency: Min 1X/week    Co-evaluation              AM-PAC OT "6 Clicks" Daily Activity     Outcome Measure Help from another person eating meals?: None Help from another person taking care of personal grooming?: A Little Help from another person toileting, which includes using toliet, bedpan, or urinal?: A Little Help from another person bathing (including washing, rinsing, drying)?: A Little Help from another person to put on and taking off regular upper body clothing?: None Help from another person to put on and taking off regular lower body clothing?: A Little 6 Click Score: 20   End of Session Equipment Utilized During Treatment: Rolling walker (2 wheels)  Activity Tolerance: Patient tolerated treatment well Patient left: in chair;with call bell/phone within reach;with chair alarm set  OT Visit Diagnosis: Other abnormalities of gait and mobility (R26.89);Muscle weakness (generalized) (M62.81)                Time:  4098-1191 OT Time Calculation (min): 15 min Charges:  OT General Charges $OT Visit: 1 Visit OT Evaluation $OT Eval Moderate Complexity: 1 Mod  Kathie Dike, M.S. OTR/L  12/27/22, 9:47 AM  ascom [phone]

## 2022-12-27 NOTE — Hospital Course (Addendum)
89 year old female with past medical history of type 2 diabetes mellitus, hypertension hyperlipidemia and diverticulitis.  Patient states that she has been on antibiotics on and off since mid August.  Recently in the hospital and discharged and now back again with abdominal pain.  CT scan concerning for sigmoid diverticulitis.  9/11.  Switched antibiotics over to IV Zosyn 9/12.  When I distracted her she was not having any abdominal pain when I palpated her abdomen today. 9/13.  Patient was feeling better.  Did not want to go home too soon.  Started having diarrhea.  MiraLAX held. 9/14.  Still having diarrhea this morning. 9/15.  Patient feeling better.  Diarrhea is settling down.  Patient feels well enough to go home.  Switch over to Augmentin liquid upon discharge

## 2022-12-27 NOTE — Assessment & Plan Note (Signed)
Continue supplements

## 2022-12-27 NOTE — Progress Notes (Signed)
Physical Therapy Treatment Patient Details Name: Claire Washington MRN: [id number] DOB: Jun 05, 1924 Today's Date: 12/27/2022   History of Present Illness Pt is a 89 y.o. female with medical history significant for type diabetes mellitus, hypertension, dyslipidemia and other medical problems as mentioned below, who presented to the emergency room with acute onset of recurrent nausea and vomiting with associated lower abdominal pain mainly in the left lower quadrant with flatulence.  She denies any fever or chills.  No chest pain or dyspnea or palpitations or cough.  No dysuria, oliguria or hematuria or flank pain.    PT Comments  Pt is steadily progressing towards PT goals. Pt is received in recliner, she is agreeable to PT session. Pt performs bed mobility, transfers, and amb CGA for safety. Pt demonstrates slight shuffling gait during step pivot transfer from recliner to [facility] but improved during amb activity. Pt able to amb approx 15 ft inside room using RW but required cuing for AD management to decrease risk for falls. Pt reports slight dizziness at the end of amb activity but adds "this is my normal". Pt in bed at end of session with reports of improved dizziness. Pt would benefit from cont skilled PT to address above deficits and promote optimal return to PLOF.     If plan is discharge home, recommend the following: A little help with walking and/or transfers;A little help with bathing/dressing/bathroom;Assistance with cooking/housework;Assist for transportation   Can travel by private vehicle        Equipment Recommendations  None recommended by PT    Recommendations for Other Services       Precautions / Restrictions Precautions Precautions: Fall Restrictions Weight Bearing Restrictions: No     Mobility  Bed Mobility Overal bed mobility: Needs Assistance Bed Mobility: Sit to Supine       Sit to supine: Contact guard assist   General bed mobility comments: Able to perform  bed mobility with no cuing required    Transfers Overall transfer level: Needs assistance Equipment used: Rolling walker (2 wheels) Transfers: Sit to/from Stand, Bed to chair/wheelchair/BSC Sit to Stand: Contact guard assist   Step pivot transfers: Contact guard assist       General transfer comment: CGA for overall transfers with no reports of dizziness; cuing for AD management    Ambulation/Gait Ambulation/Gait assistance: Contact guard assist Gait Distance (Feet): 15 Feet Assistive device: Rolling walker (2 wheels) Gait Pattern/deviations: Decreased stride length, Wide base of support, Step-through pattern Gait velocity: Decreased     General Gait Details: Able to amb using RW short distance due to Pt reports of fatigue and slight dizziness; cuing required throughout for AD management   Stairs             Wheelchair Mobility     Tilt Bed    Modified Rankin (Stroke Patients Only)       Balance Overall balance assessment: Needs assistance Sitting-balance support: Feet supported Sitting balance-Leahy Scale: Good Sitting balance - Comments: Able to maintain seated EOB and edge of recliner balance during functional activities   Standing balance support: No upper extremity supported, During functional activity Standing balance-Leahy Scale: Fair Standing balance comment: Slight shuffling gait during step pivot transfer into BSC                            Cognition Arousal: Alert Behavior During Therapy: WFL for tasks assessed/performed Overall Cognitive Status: Within Functional Limits for tasks assessed  General Comments: AOx4; pleasant and eager to work with PT        Exercises Other Exercises Other Exercises: toileting; CGA for peri-care using RW    General Comments        Pertinent Vitals/Pain Pain Assessment Pain Assessment: Faces Faces Pain Scale: Hurts a little bit Pain Location:  abdomen Pain Descriptors / Indicators: Discomfort, Dull Pain Intervention(s): Limited activity within patient's tolerance, Monitored during session, Repositioned    Home Living                          Prior Function            PT Goals (current goals can now be found in the care plan section) Acute Rehab PT Goals Patient Stated Goal: to go home PT Goal Formulation: With patient Time For Goal Achievement: 01/09/23 Potential to Achieve Goals: Good Progress towards PT goals: Progressing toward goals    Frequency    Min 1X/week      PT Plan      Co-evaluation              AM-PAC PT "6 Clicks" Mobility   Outcome Measure  Help needed turning from your back to your side while in a flat bed without using bedrails?: None Help needed moving from lying on your back to sitting on the side of a flat bed without using bedrails?: A Little Help needed moving to and from a bed to a chair (including a wheelchair)?: A Little Help needed standing up from a chair using your arms (e.g., wheelchair or bedside chair)?: A Little Help needed to walk in hospital room?: A Little Help needed climbing 3-5 steps with a railing? : A Little 6 Click Score: 19    End of Session   Activity Tolerance: Patient limited by fatigue Patient left: in bed;with call bell/phone within reach;with bed alarm set Nurse Communication: Mobility status PT Visit Diagnosis: Unsteadiness on feet (R26.81);Muscle weakness (generalized) (M62.81);Difficulty in walking, not elsewhere classified (R26.2)     Time: 1829-9371 PT Time Calculation (min) (ACUTE ONLY): 11 min  Charges:                            Elmon Else, SPT    Kenyata Napier 12/27/2022, 3:03 PM

## 2022-12-27 NOTE — Progress Notes (Signed)
  Progress Note   Patient: Claire Washington VHQ:[id number] DOB: Apr 22, 1924 DOA: 12/25/2022     1 DOS: the patient was seen and examined on 12/27/2022   Brief hospital course: 89 year old female with past medical history of type 2 diabetes mellitus, hypertension hyperlipidemia and diverticulitis.  Patient states that she has been on antibiotics on and off since mid August.  Recently in the hospital and discharged and now back again with abdominal pain.  CT scan concerning for sigmoid diverticulitis.  Assessment and Plan: * Sepsis due to undetermined organism [facility]) Present on admission with acute sigmoid diverticulitis, leukocytosis and tachypnea.  Change antibiotics over to Zosyn.  Sigmoid diverticulitis Antibiotics over to Zosyn  Hyponatremia Sodium normal range  Type 2 diabetes mellitus without complications (HCC) Check a hemoglobin A1c.  Placed on sliding scale.  Constipation Increase MiraLAX to twice daily.  1 dose of lactulose.  Protein-calorie malnutrition, severe Continue supplements  PAD (peripheral artery disease) (HCC) Continue Plavix  Essential hypertension Continue Norvasc        Subjective: Patient states that she has been on and off antibiotics since mid August.  Patient coming in with abdominal pain and some constipation.  Diagnosed again with diverticulitis.  Patient not interested in aggressive surgical management.  Physical Exam: Vitals:   12/26/22 1402 12/26/22 2125 12/27/22 0500 12/27/22 0507  BP:  135/60  139/62  Pulse:  87  80  Resp:  16  17  Temp:  98.3 F (36.8 C)  97.6 F (36.4 C)  TempSrc:  Oral  Oral  SpO2:  93%  93%  Weight: 49.7 kg  48.6 kg   Height:       Physical Exam HENT:     Head: Normocephalic.     Mouth/Throat:     Pharynx: No oropharyngeal exudate.  Eyes:     General: Lids are normal.     Conjunctiva/sclera: Conjunctivae normal.  Cardiovascular:     Rate and Rhythm: Normal rate and regular rhythm.     Heart sounds:  Normal heart sounds, S1 normal and S2 normal.  Pulmonary:     Breath sounds: No decreased breath sounds, wheezing, rhonchi or rales.  Abdominal:     Palpations: Abdomen is soft.     Tenderness: There is no abdominal tenderness.  Musculoskeletal:     Right lower leg: No swelling.     Left lower leg: No swelling.  Skin:    General: Skin is warm.     Findings: No rash.  Neurological:     Mental Status: She is alert and oriented to person, place, and time.     Data Reviewed: Creatinine 0.53, white blood cell count 12.8, hemoglobin 13.4, procalcitonin 0.35, CT scan showing inflamed diverticuli in sigmoid colon Family Communication: Spoke with patient's daughter on the phone  Disposition: Status is: Inpatient Remains inpatient appropriate because: Switch IV antibiotics over to Zosyn.  Planned Discharge Destination: Home    Time spent: 28 minutes  Author: Alford Highland, MD 12/27/2022 3:00 PM  For on call review [email or web address].

## 2022-12-27 NOTE — TOC Initial Note (Signed)
Transition of Care [facility]) - Initial/Assessment Note    Patient Details  Name: Claire Washington MRN: [id number] Date of Birth: 1925-01-15  Transition of Care [facility]) CM/SW Contact:    Chapman Fitch, RN Phone Number: 12/27/2022, 1:04 PM  Clinical Narrative:                  Admitted OAC:ZYSAYT Admitted from: home alone PCP: Hande Current home health/prior home health/DME: rollator, manuel wc, and BSC  Therapy recommends home health and BSC.  Confirmed with patient that she already has BSC in the home.  Patient active with [facility] for PT and OT   Expected Discharge Plan: Home w Home Health Services     Patient Goals and CMS Choice            Expected Discharge Plan and Services                                              Prior Living Arrangements/Services                       Activities of Daily Living Home Assistive Devices/Equipment: Environmental consultant (specify type), Eyeglasses, Dentures (specify type) ADL Screening (condition at time of admission) Patient's cognitive ability adequate to safely complete daily activities?: Yes Is the patient deaf or have difficulty hearing?: Yes Does the patient have difficulty seeing, even when wearing glasses/contacts?: No Does the patient have difficulty concentrating, remembering, or making decisions?: No Patient able to express need for assistance with ADLs?: Yes Does the patient have difficulty dressing or bathing?: No Independently performs ADLs?: Yes (appropriate for developmental age) Does the patient have difficulty walking or climbing stairs?: Yes Weakness of Legs: Both Weakness of Arms/Hands: Both  Permission Sought/Granted                  Emotional Assessment              Admission diagnosis:  Diverticulitis [K57.92] Sepsis due to undetermined organism [facility]) [A41.9] Patient Active Problem List   Diagnosis Date Noted   PAD (peripheral artery disease) (HCC) 12/26/2022    Protein-calorie malnutrition, severe 12/26/2022   Sepsis due to undetermined organism (HCC) 12/12/2022   Type 2 diabetes mellitus without complications (HCC) 12/12/2022   Acute diverticulitis 12/11/2022   Acute respiratory failure with hypoxia (HCC) 01/18/2022   Nausea vomiting and diarrhea 01/14/2022   Sigmoid diverticulitis 01/13/2022   Hyponatremia 08/31/2020   Lymphedema 03/09/2019   Swelling of limb 02/28/2019   Cellulitis of left leg 12/31/2018   Hip pain, bilateral 12/31/2018   Cellulitis 12/27/2018   Carotid stenosis 12/28/2017   Vertigo 11/30/2017   Imbalance due to old stroke 11/03/2016   Gait instability 10/19/2016   Ataxia 08/30/2016   History of stroke 08/30/2016   Dizziness 11/03/2015   Abnormal PET scan of colon 06/18/2014   H/O seasonal allergies 06/04/2014   Lung nodule, solitary 06/04/2014   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   Essential hypertension 12/04/2011   Hyperlipidemia 12/04/2011   Syncope 12/04/2011   Diabetes mellitus (HCC) 12/04/2011   Smoking hx 12/04/2011   PCP:  Barbette Reichmann, MD Pharmacy:   [facility] [phone] [place], [place] - [address] [place] [address] Phone: [phone] Fax: [phone]  [facility] - [place], [place] - [address] [place] [address] Phone: [phone] Fax: [phone]     Social Determinants of Health (SDOH) Social History: SDOH Screenings   Food Insecurity: No Food Insecurity (12/26/2022)  Housing: Low Risk  (12/26/2022)  Transportation Needs: No Transportation Needs (12/26/2022)  Utilities: Not At Risk (12/26/2022)  Financial Resource Strain: Low Risk  (12/22/2022)   Received from [facility]  Tobacco Use: Medium Risk (12/26/2022)   SDOH Interventions:     Readmission Risk Interventions     No data to display

## 2022-12-28 DIAGNOSIS — A419 Sepsis, unspecified organism: Secondary | ICD-10-CM | POA: Diagnosis not present

## 2022-12-28 DIAGNOSIS — E871 Hypo-osmolality and hyponatremia: Secondary | ICD-10-CM | POA: Diagnosis not present

## 2022-12-28 DIAGNOSIS — E119 Type 2 diabetes mellitus without complications: Secondary | ICD-10-CM | POA: Diagnosis not present

## 2022-12-28 DIAGNOSIS — G319 Degenerative disease of nervous system, unspecified: Secondary | ICD-10-CM

## 2022-12-28 DIAGNOSIS — K5732 Diverticulitis of large intestine without perforation or abscess without bleeding: Secondary | ICD-10-CM | POA: Diagnosis not present

## 2022-12-28 LAB — GLUCOSE, CAPILLARY
Glucose-Capillary: 111 mg/dL — ABNORMAL HIGH (ref 70–99)
Glucose-Capillary: 158 mg/dL — ABNORMAL HIGH (ref 70–99)
Glucose-Capillary: 114 mg/dL — ABNORMAL HIGH (ref 70–99)
Glucose-Capillary: 150 mg/dL — ABNORMAL HIGH (ref 70–99)

## 2022-12-28 LAB — CBC
HCT: 42.6 % (ref 36.0–46.0)
Hemoglobin: 13.9 g/dL (ref 12.0–15.0)
MCH: 28 pg (ref 26.0–34.0)
MCHC: 32.6 g/dL (ref 30.0–36.0)
MCV: 85.9 fL (ref 80.0–100.0)
Platelets: 265 10*3/uL (ref 150–400)
RBC: 4.96 MIL/uL (ref 3.87–5.11)
RDW: 13.3 % (ref 11.5–15.5)
WBC: 5.7 10*3/uL (ref 4.0–10.5)
nRBC: 0 % (ref 0.0–0.2)

## 2022-12-28 NOTE — Plan of Care (Signed)

## 2022-12-28 NOTE — Progress Notes (Signed)
  Progress Note   Patient: Claire Washington WJX:[id number] DOB: 08-19-1924 DOA: 12/25/2022     2 DOS: the patient was seen and examined on 12/28/2022   Brief hospital course: 89 year old female with past medical history of type 2 diabetes mellitus, hypertension hyperlipidemia and diverticulitis.  Patient states that she has been on antibiotics on and off since mid August.  Recently in the hospital and discharged and now back again with abdominal pain.  CT scan concerning for sigmoid diverticulitis.  9/11.  Switched antibiotics over to IV Zosyn 9/12.  When I distracted her she was not having any abdominal pain when I palpated her abdomen today.  Assessment and Plan: * Sepsis due to undetermined organism [facility]) Present on admission with acute sigmoid diverticulitis, leukocytosis and tachypnea.  Continue Zosyn.  Sigmoid diverticulitis Continue Zosyn  Hyponatremia Sodium normal range  Type 2 diabetes mellitus without complications (HCC) hemoglobin A1c is pending.  Placed on sliding scale.  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  As per daughter this was diagnosed at [facility].  Patient did not want any treatment.  Constipation Continue MiraLAX  twice daily.   Protein-calorie malnutrition, severe Continue supplements  PAD (peripheral artery disease) (HCC) Continue Plavix  Essential hypertension Continue Norvasc        Subjective: Patient feeling a little bit better.  Still having pain.  When I examined her abdomen with distracting her she did not have any pain.  Mated with acute diverticulitis.  Physical Exam: Vitals:   12/27/22 2023 12/28/22 0306 12/28/22 0527 12/28/22 0756  BP: (!) 150/66 (!) 142/72  (!) 155/62  Pulse: 83 80  75  Resp: 18 18  16   Temp: 98.3 F (36.8 C) 98.4 F (36.9 C)  97.6 F (36.4 C)  TempSrc:    Oral  SpO2: 96% 98%  96%  Weight:   48.4 kg   Height:       Physical Exam HENT:     Head:  Normocephalic.     Mouth/Throat:     Pharynx: No oropharyngeal exudate.  Eyes:     General: Lids are normal.     Conjunctiva/sclera: Conjunctivae normal.  Cardiovascular:     Rate and Rhythm: Normal rate and regular rhythm.     Heart sounds: Normal heart sounds, S1 normal and S2 normal.  Pulmonary:     Breath sounds: No decreased breath sounds, wheezing, rhonchi or rales.  Abdominal:     Palpations: Abdomen is soft.     Tenderness: There is no abdominal tenderness.  Musculoskeletal:     Right lower leg: No swelling.     Left lower leg: No swelling.  Skin:    General: Skin is warm.     Findings: No rash.  Neurological:     Mental Status: She is alert and oriented to person, place, and time.     Data Reviewed: White blood cell count 5.7, hemoglobin 13.9, platelet count 265  Family Communication: Updated patient's daughter on the phone  Disposition: Status is: Inpatient Remains inpatient appropriate because: Treating with IV antibiotics for recurrent diverticulitis.  Patient failed outpatient and 1 inpatient course already.  Planned Discharge Destination: Home    Time spent: 28 minutes  Author: Alford Highland, MD 12/28/2022 1:57 PM  For on call review [email or web address].

## 2022-12-28 NOTE — Assessment & Plan Note (Signed)
Progressive autonomic nervous system degeneration.  As per daughter this was diagnosed at [facility].  Patient did not want any treatment.

## 2022-12-29 DIAGNOSIS — A419 Sepsis, unspecified organism: Secondary | ICD-10-CM | POA: Diagnosis not present

## 2022-12-29 DIAGNOSIS — E119 Type 2 diabetes mellitus without complications: Secondary | ICD-10-CM | POA: Diagnosis not present

## 2022-12-29 DIAGNOSIS — E871 Hypo-osmolality and hyponatremia: Secondary | ICD-10-CM | POA: Diagnosis not present

## 2022-12-29 DIAGNOSIS — E7849 Other hyperlipidemia: Secondary | ICD-10-CM

## 2022-12-29 DIAGNOSIS — K5732 Diverticulitis of large intestine without perforation or abscess without bleeding: Secondary | ICD-10-CM | POA: Diagnosis not present

## 2022-12-29 LAB — GLUCOSE, CAPILLARY
Glucose-Capillary: 103 mg/dL — ABNORMAL HIGH (ref 70–99)
Glucose-Capillary: 153 mg/dL — ABNORMAL HIGH (ref 70–99)

## 2022-12-29 LAB — HEMOGLOBIN A1C
Hgb A1c MFr Bld: 5.7 % — ABNORMAL HIGH (ref 4.8–5.6)
Mean Plasma Glucose: 117 mg/dL

## 2022-12-29 MED ORDER — POLYETHYLENE GLYCOL 3350 17 G PO PACK: 17.0000 g | PACK | Freq: Every day | ORAL | Status: DC

## 2022-12-29 NOTE — Progress Notes (Signed)
The patient refused Miralax at this time reports he had 2 bowel movements this morning.

## 2022-12-29 NOTE — Progress Notes (Signed)
Pharmacy Antibiotic Note  Claire Washington is a 89 y.o. female w/ PMH of HTN, DM, PVD, HLD, SDH admitted on 12/25/2022 with diverticulitis.  Pharmacy has been consulted for Zosyn dosing.  Plan: conitnue Zosyn 3.375g IV q8h (4 hour infusion). ---follow renal function for needed dose adjustments  Height: 5' (152.4 cm) Weight: 48.4 kg (106 lb 11.2 oz) IBW/kg (Calculated) : 45.5  Temp (24hrs), Avg:97.7 F (36.5 C), Min:97.6 F (36.4 C), Max:97.7 F (36.5 C)  Recent Labs  Lab 12/25/22 2026 12/26/22 0520 12/28/22 0402  WBC 16.5* 12.8* 5.7  CREATININE 0.60 0.53  --     Estimated Creatinine Clearance: 29.5 mL/min (by C-G formula based on SCr of 0.53 mg/dL).    Allergies  Allergen Reactions   Latex Dermatitis   Hydrocortisone Swelling   Sulfa Antibiotics Other (See Comments)    Reaction: unknown Other reaction(s): Not available   Tape Dermatitis    Antimicrobials this admission: 09/10 ceftriaxone >> 09/11 09/10 metronidazole >> 09/11 09/11 Zosyn >>   Microbiology results: none to date  Thank you for allowing pharmacy to be a part of this patient's care.  Barrie Folk, PharmD 12/29/2022 7:21 AM

## 2022-12-29 NOTE — Progress Notes (Signed)
Occupational Therapy Treatment Patient Details Name: Claire Washington MRN: [id number] DOB: Mar 11, 1925 Today's Date: 12/29/2022   History of present illness Pt is a 89 y.o. female with medical history significant for type diabetes mellitus, hypertension, dyslipidemia and other medical problems as mentioned below, who presented to the emergency room with acute onset of recurrent nausea and vomiting with associated lower abdominal pain mainly in the left lower quadrant with flatulence.  She denies any fever or chills.  No chest pain or dyspnea or palpitations or cough.  No dysuria, oliguria or hematuria or flank pain.   OT comments  Ms Devillers was seen for OT treatment on this date. Upon arrival to room pt reclined in bed, agreeable to tx. Pt requires SBA + RW for BSC t/f and standing grooming tasks. Tolerates ~4 min static standing. Pt making good progress toward goals, will continue to follow POC. Discharge recommendation remains appropriate.        If plan is discharge home, recommend the following:  A little help with walking and/or transfers;A little help with bathing/dressing/bathroom;Help with stairs or ramp for entrance   Equipment Recommendations  BSC/3in1    Recommendations for Other Services      Precautions / Restrictions Precautions Precautions: Fall Restrictions Weight Bearing Restrictions: No       Mobility Bed Mobility Overal bed mobility: Needs Assistance Bed Mobility: Sit to Supine, Supine to Sit     Supine to sit: Contact guard Sit to supine: Contact guard assist        Transfers Overall transfer level: Needs assistance Equipment used: Rolling walker (2 wheels) Transfers: Sit to/from Stand Sit to Stand: Supervision                 Balance Overall balance assessment: Needs assistance Sitting-balance support: Feet supported Sitting balance-Leahy Scale: Good     Standing balance support: No upper extremity supported, During functional  activity Standing balance-Leahy Scale: Fair                             ADL either performed or assessed with clinical judgement   ADL Overall ADL's : Needs assistance/impaired                                       General ADL Comments: SBA + RW for BSC t/f and standing grooming tasks.      Cognition Arousal: Alert Behavior During Therapy: WFL for tasks assessed/performed Overall Cognitive Status: Within Functional Limits for tasks assessed                                                     Pertinent Vitals/ Pain       Pain Assessment Pain Assessment: No/denies pain   Frequency  Min 1X/week        Progress Toward Goals  OT Goals(current goals can now be found in the care plan section)  Progress towards OT goals: Progressing toward goals  Acute Rehab OT Goals Patient Stated Goal: to go home OT Goal Formulation: With patient Time For Goal Achievement: 01/10/23 Potential to Achieve Goals: Good ADL Goals Pt Will Perform Grooming: with modified independence;standing Pt Will Perform Lower Body Dressing: with modified independence;sit to/from stand  Pt Will Transfer to Toilet: with modified independence;ambulating;regular height toilet   AM-PAC OT "6 Clicks" Daily Activity     Outcome Measure   Help from another person eating meals?: None Help from another person taking care of personal grooming?: A Little Help from another person toileting, which includes using toliet, bedpan, or urinal?: A Little Help from another person bathing (including washing, rinsing, drying)?: A Little Help from another person to put on and taking off regular upper body clothing?: None Help from another person to put on and taking off regular lower body clothing?: A Little 6 Click Score: 20    End of Session    OT Visit Diagnosis: Other abnormalities of gait and mobility (R26.89);Muscle weakness (generalized) (M62.81)   Activity  Tolerance Patient tolerated treatment well   Patient Left in bed;with bed alarm set;with call bell/phone within reach   Nurse Communication          Time: 1308-6578 OT Time Calculation (min): 18 min  Charges: OT General Charges $OT Visit: 1 Visit OT Treatments $Self Care/Home Management : 8-22 mins  Kathie Dike, M.S. OTR/L  12/29/22, 3:54 PM  ascom [phone]

## 2022-12-29 NOTE — Care Management Important Message (Signed)
Important Message  Patient Details  Name: Claire Washington MRN: [id number] Date of Birth: Nov 19, 1924   Medicare Important Message Given:  Yes     Johnell Comings 12/29/2022, 10:47 AM

## 2022-12-29 NOTE — Progress Notes (Signed)
Physical Therapy Treatment Patient Details Name: Claire Washington MRN: [id number] DOB: 10-31-1924 Today's Date: 12/29/2022   History of Present Illness Pt is a 89 y.o. female with medical history significant for type diabetes mellitus, hypertension, dyslipidemia and other medical problems as mentioned below, who presented to the emergency room with acute onset of recurrent nausea and vomiting with associated lower abdominal pain mainly in the left lower quadrant with flatulence.  She denies any fever or chills.  No chest pain or dyspnea or palpitations or cough.  No dysuria, oliguria or hematuria or flank pain.    PT Comments  Patient in recliner on arrival and eager to participate. Patient progressing well towards physical therapy goals. Continues to report increased dizziness compared to her normal amount of dizziness. Ambulated 25' + 15' with RW and CGA. Prefers use of rollator as she has been using one for >10 years. Discharge plan remains appropriate.     If plan is discharge home, recommend the following: A little help with walking and/or transfers;A little help with bathing/dressing/bathroom;Assistance with cooking/housework;Assist for transportation   Can travel by private vehicle        Equipment Recommendations  None recommended by PT    Recommendations for Other Services       Precautions / Restrictions Precautions Precautions: Fall Restrictions Weight Bearing Restrictions: No     Mobility  Bed Mobility               General bed mobility comments: in recliner on arrival    Transfers Overall transfer level: Needs assistance Equipment used: Rolling Hykeem Ojeda (2 wheels) Transfers: Sit to/from Stand Sit to Stand: Contact guard assist                Ambulation/Gait Ambulation/Gait assistance: Contact guard assist Gait Distance (Feet): 25 Feet (+15') Assistive device: Rolling Shakeyla Giebler (2 wheels) Gait Pattern/deviations: Decreased stride length, Wide base of  support, Step-through pattern Gait velocity: Decreased     General Gait Details: cues for RW management. Reports fatigue and dizziness with mobility but patient states slightly increased from baseline dizziness   Stairs             Wheelchair Mobility     Tilt Bed    Modified Rankin (Stroke Patients Only)       Balance Overall balance assessment: Needs assistance Sitting-balance support: Feet supported Sitting balance-Leahy Scale: Good     Standing balance support: No upper extremity supported, During functional activity Standing balance-Leahy Scale: Fair                              Cognition Arousal: Alert Behavior During Therapy: WFL for tasks assessed/performed Overall Cognitive Status: Within Functional Limits for tasks assessed                                          Exercises      General Comments        Pertinent Vitals/Pain Pain Assessment Pain Assessment: No/denies pain    Home Living                          Prior Function            PT Goals (current goals can now be found in the care plan section) Acute Rehab PT Goals Patient Stated  Goal: to go home PT Goal Formulation: With patient Time For Goal Achievement: 01/09/23 Potential to Achieve Goals: Good Progress towards PT goals: Progressing toward goals    Frequency    Min 1X/week      PT Plan      Co-evaluation              AM-PAC PT "6 Clicks" Mobility   Outcome Measure  Help needed turning from your back to your side while in a flat bed without using bedrails?: None Help needed moving from lying on your back to sitting on the side of a flat bed without using bedrails?: A Little Help needed moving to and from a bed to a chair (including a wheelchair)?: A Little Help needed standing up from a chair using your arms (e.g., wheelchair or bedside chair)?: A Little Help needed to walk in hospital room?: A Little Help needed  climbing 3-5 steps with a railing? : A Little 6 Click Score: 19    End of Session Equipment Utilized During Treatment: Gait belt Activity Tolerance: Patient limited by fatigue Patient left: in chair;with call bell/phone within reach;with chair alarm set Nurse Communication: Mobility status PT Visit Diagnosis: Unsteadiness on feet (R26.81);Muscle weakness (generalized) (M62.81);Difficulty in walking, not elsewhere classified (R26.2)     Time: 1036-1050 PT Time Calculation (min) (ACUTE ONLY): 14 min  Charges:    $Therapeutic Activity: 8-22 mins PT General Charges $$ ACUTE PT VISIT: 1 Visit                     Maylon Peppers, PT, DPT Physical Therapist - [facility]  [facility]    Attikus Bartoszek A Delanie Tirrell 12/29/2022, 12:19 PM

## 2022-12-29 NOTE — Progress Notes (Signed)
Progress Note   Patient: Claire Washington ZHY:[id number] DOB: [birth year] DOA: 12/25/2022     3 DOS: the patient was seen and examined on 12/29/2022   Brief hospital course: 89 year old female with past medical history of type 2 diabetes mellitus, hypertension hyperlipidemia and diverticulitis.  Patient states that she has been on antibiotics on and off since mid August.  Recently in the hospital and discharged and now back again with abdominal pain.  CT scan concerning for sigmoid diverticulitis.  9/11.  Switched antibiotics over to IV Zosyn 9/12.  When I distracted her she was not having any abdominal pain when I palpated her abdomen today.  Assessment and Plan: * Sepsis due to undetermined organism [facility]) Present on admission with acute sigmoid diverticulitis, leukocytosis and tachypnea.  Continue Zosyn today and reassess tomorrow for potential discharge..  Sigmoid diverticulitis Continue Zosyn  Hyponatremia Sodium normal range  Type 2 diabetes mellitus without complications (HCC) hemoglobin A1c is 5.7.  Discontinue sliding scale.  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  As per daughter this was diagnosed at [facility].  Patient did not want any treatment.  Constipation Continue MiraLAX daily  Protein-calorie malnutrition, severe Continue supplements  PAD (peripheral artery disease) (HCC) Continue Plavix  Essential hypertension Continue Norvasc        Subjective: Patient feeling better.  Less abdominal pain.  Patient's daughter mentioned to me that she had some loose bowel movements.  Physical Exam: Vitals:   12/28/22 1610 12/28/22 2005 12/29/22 0406 12/29/22 0756  BP: (!) 166/64 (!) 156/56 (!) 151/74 (!) 155/61  Pulse: 77 85 77 77  Resp: (!) 24 18 18 18   Temp: 97.7 F (36.5 C) 97.6 F (36.4 C) 97.7 F (36.5 C) 97.7 F (36.5 C)  TempSrc: Oral Oral Oral Oral  SpO2: 96% 96% 95% 100%  Weight:      Height:        Physical Exam HENT:     Head: Normocephalic.     Mouth/Throat:     Pharynx: No oropharyngeal exudate.  Eyes:     General: Lids are normal.     Conjunctiva/sclera: Conjunctivae normal.  Cardiovascular:     Rate and Rhythm: Normal rate and regular rhythm.     Heart sounds: Normal heart sounds, S1 normal and S2 normal.  Pulmonary:     Breath sounds: No decreased breath sounds, wheezing, rhonchi or rales.  Abdominal:     Palpations: Abdomen is soft.     Tenderness: There is no abdominal tenderness.  Musculoskeletal:     Right lower leg: No swelling.     Left lower leg: No swelling.  Skin:    General: Skin is warm.     Findings: No rash.  Neurological:     Mental Status: She is alert and oriented to person, place, and time.     Data Reviewed: Last 4 sugars 114, 150, 103 and 153  Family Communication: Updated patient's daughter on the phone  Disposition: Status is: Inpatient Remains inpatient appropriate because: Since patient failed outpatient course and inpatient course will give 1 more day of IV Zosyn and reassess tomorrow for potential discharge.  Planned Discharge Destination: Home    Time spent: 28 minutes  Author: Alford Highland, MD 12/29/2022 2:41 PM  For on call review [email or web address].

## 2022-12-30 DIAGNOSIS — A419 Sepsis, unspecified organism: Secondary | ICD-10-CM | POA: Diagnosis not present

## 2022-12-30 DIAGNOSIS — K5732 Diverticulitis of large intestine without perforation or abscess without bleeding: Secondary | ICD-10-CM | POA: Diagnosis not present

## 2022-12-30 DIAGNOSIS — R197 Diarrhea, unspecified: Secondary | ICD-10-CM | POA: Insufficient documentation

## 2022-12-30 DIAGNOSIS — E119 Type 2 diabetes mellitus without complications: Secondary | ICD-10-CM | POA: Diagnosis not present

## 2022-12-30 DIAGNOSIS — E871 Hypo-osmolality and hyponatremia: Secondary | ICD-10-CM | POA: Diagnosis not present

## 2022-12-30 LAB — CBC
HCT: 43.3 % (ref 36.0–46.0)
Hemoglobin: 14.4 g/dL (ref 12.0–15.0)
MCH: 28.3 pg (ref 26.0–34.0)
MCHC: 33.3 g/dL (ref 30.0–36.0)
MCV: 85.1 fL (ref 80.0–100.0)
Platelets: 250 10*3/uL (ref 150–400)
RBC: 5.09 MIL/uL (ref 3.87–5.11)
RDW: 13.1 % (ref 11.5–15.5)
WBC: 5.2 10*3/uL (ref 4.0–10.5)
nRBC: 0 % (ref 0.0–0.2)

## 2022-12-30 NOTE — Plan of Care (Signed)

## 2022-12-30 NOTE — Plan of Care (Signed)
Patient alert and oriented. Patient had multiple loose stool this shift.  Encouraged fluids.  Patient sat in chair x2 and transferred 1 assist to [facility].  PO intake increased and report better appetite.  Denies pain.  Call bell within reach. Problem: Fluid Volume: Goal: Hemodynamic stability will improve Outcome: Progressing   Problem: Clinical Measurements: Goal: Diagnostic test results will improve Outcome: Progressing Goal: Signs and symptoms of infection will decrease Outcome: Progressing   Problem: Respiratory: Goal: Ability to maintain adequate ventilation will improve Outcome: Progressing   Problem: Education: Goal: Knowledge of General Education information will improve Description: Including pain rating scale, medication(s)/side effects and non-pharmacologic comfort measures Outcome: Progressing   Problem: Health Behavior/Discharge Planning: Goal: Ability to manage health-related needs will improve Outcome: Progressing   Problem: Clinical Measurements: Goal: Ability to maintain clinical measurements within normal limits will improve Outcome: Progressing Goal: Will remain free from infection Outcome: Progressing Goal: Diagnostic test results will improve Outcome: Progressing Goal: Respiratory complications will improve Outcome: Progressing Goal: Cardiovascular complication will be avoided Outcome: Progressing   Problem: Activity: Goal: Risk for activity intolerance will decrease Outcome: Progressing   Problem: Nutrition: Goal: Adequate nutrition will be maintained Outcome: Progressing   Problem: Coping: Goal: Level of anxiety will decrease Outcome: Progressing   Problem: Elimination: Goal: Will not experience complications related to bowel motility Outcome: Progressing Goal: Will not experience complications related to urinary retention Outcome: Progressing   Problem: Pain Managment: Goal: General experience of comfort will improve Outcome: Progressing    Problem: Safety: Goal: Ability to remain free from injury will improve Outcome: Progressing   Problem: Skin Integrity: Goal: Risk for impaired skin integrity will decrease Outcome: Progressing   Problem: Education: Goal: Ability to describe self-care measures that may prevent or decrease complications (Diabetes Survival Skills Education) will improve Outcome: Progressing Goal: Individualized Educational Video(s) Outcome: Progressing   Problem: Coping: Goal: Ability to adjust to condition or change in health will improve Outcome: Progressing   Problem: Fluid Volume: Goal: Ability to maintain a balanced intake and output will improve Outcome: Progressing   Problem: Health Behavior/Discharge Planning: Goal: Ability to identify and utilize available resources and services will improve Outcome: Progressing Goal: Ability to manage health-related needs will improve Outcome: Progressing   Problem: Metabolic: Goal: Ability to maintain appropriate glucose levels will improve Outcome: Progressing   Problem: Nutritional: Goal: Maintenance of adequate nutrition will improve Outcome: Progressing Goal: Progress toward achieving an optimal weight will improve Outcome: Progressing   Problem: Skin Integrity: Goal: Risk for impaired skin integrity will decrease Outcome: Progressing   Problem: Tissue Perfusion: Goal: Adequacy of tissue perfusion will improve Outcome: Progressing

## 2022-12-30 NOTE — Progress Notes (Signed)
Progress Note   Patient: Claire Washington ZOX:[id number] DOB: 11-12-1924 DOA: 12/25/2022     4 DOS: the patient was seen and examined on 12/30/2022   Brief hospital course: 89 year old female with past medical history of type 2 diabetes mellitus, hypertension hyperlipidemia and diverticulitis.  Patient states that she has been on antibiotics on and off since mid August.  Recently in the hospital and discharged and now back again with abdominal pain.  CT scan concerning for sigmoid diverticulitis.  9/11.  Switched antibiotics over to IV Zosyn 9/12.  When I distracted her she was not having any abdominal pain when I palpated her abdomen today. 9/13.  Patient was feeling better.  Did not want to go home too soon.  Started having diarrhea.  MiraLAX held. 9/14.  Still having diarrhea this morning.  Assessment and Plan: * Sepsis due to undetermined organism [facility]) Present on admission with acute sigmoid diverticulitis, leukocytosis and tachypnea.  Continue Zosyn today and reassess tomorrow for potential discharge..  Sigmoid diverticulitis Continue Zosyn  Hyponatremia Sodium normal range  Type 2 diabetes mellitus without complications (HCC) hemoglobin A1c is 5.7.  Discontinue sliding scale.  Diarrhea MiraLAX discontinued yesterday.  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  As per daughter this was diagnosed at [facility].  Patient did not want any treatment.  Protein-calorie malnutrition, severe Continue supplements  PAD (peripheral artery disease) (HCC) Continue Plavix  Essential hypertension Continue Norvasc        Subjective: Patient this morning having some abdominal pain and gas.  Had 2 episodes of diarrhea today.  She stated she had 6 bowel movements yesterday.  Physical Exam: Vitals:   12/29/22 2018 12/30/22 0227 12/30/22 0828 12/30/22 0900  BP: (!) 165/62 (!) 164/72 (!) 146/66   Pulse: 89 79 78   Resp: 18 18    Temp: 97.7  F (36.5 C) (!) 97.5 F (36.4 C) 97.7 F (36.5 C)   TempSrc: Oral Oral Oral   SpO2: 97% 96% 95%   Weight:    48.2 kg  Height:       Physical Exam HENT:     Head: Normocephalic.     Mouth/Throat:     Pharynx: No oropharyngeal exudate.  Eyes:     General: Lids are normal.     Conjunctiva/sclera: Conjunctivae normal.  Cardiovascular:     Rate and Rhythm: Normal rate and regular rhythm.     Heart sounds: Normal heart sounds, S1 normal and S2 normal.  Pulmonary:     Breath sounds: No decreased breath sounds, wheezing, rhonchi or rales.  Abdominal:     Palpations: Abdomen is soft.     Tenderness: There is abdominal tenderness in the left lower quadrant.  Musculoskeletal:     Right lower leg: No swelling.     Left lower leg: No swelling.  Skin:    General: Skin is warm.     Findings: No rash.  Neurological:     Mental Status: She is alert and oriented to person, place, and time.     Data Reviewed: White blood cell count 5.2  Family Communication: Spoke with patient's daughter on the phone  Disposition: Status is: Inpatient Remains inpatient appropriate because: With diarrhea yesterday evening and again today will reevaluate things tomorrow.  Planned Discharge Destination: Home with Home Health    Time spent: 28 minutes  Author: Alford Highland, MD 12/30/2022 2:06 PM  For on call review [email or web address].

## 2022-12-30 NOTE — Assessment & Plan Note (Signed)
MiraLAX discontinued yesterday.

## 2022-12-31 DIAGNOSIS — E871 Hypo-osmolality and hyponatremia: Secondary | ICD-10-CM | POA: Diagnosis not present

## 2022-12-31 DIAGNOSIS — E119 Type 2 diabetes mellitus without complications: Secondary | ICD-10-CM | POA: Diagnosis not present

## 2022-12-31 DIAGNOSIS — K5732 Diverticulitis of large intestine without perforation or abscess without bleeding: Secondary | ICD-10-CM | POA: Diagnosis not present

## 2022-12-31 DIAGNOSIS — A419 Sepsis, unspecified organism: Secondary | ICD-10-CM | POA: Diagnosis not present

## 2022-12-31 LAB — BASIC METABOLIC PANEL
Anion gap: 9 (ref 5–15)
BUN: 16 mg/dL (ref 8–23)
CO2: 26 mmol/L (ref 22–32)
Calcium: 8.7 mg/dL — ABNORMAL LOW (ref 8.9–10.3)
Chloride: 104 mmol/L (ref 98–111)
Creatinine, Ser: 0.54 mg/dL (ref 0.44–1.00)
GFR, Estimated: >60 mL/min (ref >60)
Glucose, Bld: 116 mg/dL — ABNORMAL HIGH (ref 70–99)
Potassium: 4 mmol/L (ref 3.5–5.1)
Sodium: 139 mmol/L (ref 135–145)

## 2022-12-31 LAB — CBC
HCT: 43.6 % (ref 36.0–46.0)
Hemoglobin: 14.5 g/dL (ref 12.0–15.0)
MCH: 28.2 pg (ref 26.0–34.0)
MCHC: 33.3 g/dL (ref 30.0–36.0)
MCV: 84.8 fL (ref 80.0–100.0)
Platelets: 251 10*3/uL (ref 150–400)
RBC: 5.14 MIL/uL — ABNORMAL HIGH (ref 3.87–5.11)
RDW: 13.2 % (ref 11.5–15.5)
WBC: 5.2 10*3/uL (ref 4.0–10.5)
nRBC: 0 % (ref 0.0–0.2)

## 2022-12-31 LAB — MAGNESIUM: Magnesium: 2.1 mg/dL (ref 1.7–2.4)

## 2022-12-31 MED ORDER — AMOXICILLIN-POT CLAVULANATE 250-62.5 MG/5ML PO SUSR: 500.0000 mg | Freq: Two times a day (BID) | ORAL | 0 refills | Status: AC

## 2022-12-31 MED ORDER — CLOPIDOGREL BISULFATE 75 MG PO TABS: 75.0000 mg | ORAL_TABLET | Freq: Every day | ORAL | Status: DC

## 2022-12-31 MED ORDER — AMOXICILLIN-POT CLAVULANATE 400-57 MG/5ML PO SUSR
500.0000 mg | Freq: Two times a day (BID) | ORAL | Status: DC
  Administered 2022-12-31: 500 mg via ORAL
  Filled 2022-12-31: qty 6.25

## 2022-12-31 MED ORDER — POLYETHYLENE GLYCOL 3350 17 G PO PACK: 17.0000 g | PACK | Freq: Every day | ORAL | 0 refills | Status: AC | PRN

## 2022-12-31 NOTE — TOC Transition Note (Signed)
Transition of Care [facility]) - CM/SW Discharge Note   Patient Details  Name: Claire Washington MRN: [id number] Date of Birth: 30-Apr-1924  Transition of Care [facility]) CM/SW Contact:  Darolyn Rua, LCSW Phone Number: 12/31/2022, 9:01 AM   Clinical Narrative:     Patient to discharge home today with [facility], HH orders are in and [place] with [facility] informed. Family to transport at discharge, no additional needs at this time.    Final next level of care: Home w Home Health Services Barriers to Discharge: No Barriers Identified   Patient Goals and CMS Choice CMS Medicare.gov Compare Post Acute Care list provided to:: Patient Choice offered to / list presented to : Patient  Discharge Placement                         Discharge Plan and Services Additional resources added to the After Visit Summary for                                Date [facility] Agency Contacted: 12/31/22   Representative spoke with at [facility] Agency: Herbert Seta  Social Determinants of Health (SDOH) Interventions SDOH Screenings   Food Insecurity: No Food Insecurity (12/26/2022)  Housing: Low Risk  (12/26/2022)  Transportation Needs: No Transportation Needs (12/26/2022)  Utilities: Not At Risk (12/26/2022)  Financial Resource Strain: Low Risk  (12/22/2022)   Received from [facility]  Tobacco Use: Medium Risk (12/26/2022)     Readmission Risk Interventions     No data to display

## 2022-12-31 NOTE — Discharge Instructions (Signed)
Take 2 doses of augmentin today one at 12 noon and one before bed.

## 2022-12-31 NOTE — Plan of Care (Signed)
Problem: Pain Managment: Goal: General experience of comfort will improve Outcome: Progressing   Problem: Skin Integrity: Goal: Risk for impaired skin integrity will decrease Outcome: Progressing   Problem: Safety: Goal: Ability to remain free from injury will improve Outcome: Progressing

## 2022-12-31 NOTE — Discharge Summary (Signed)
Physician Discharge Summary   Patient: Claire Washington MRN: [id number] DOB: 01-16-1925  Admit date:     12/25/2022  Discharge date: 12/31/22  Discharge Physician: Alford Highland   PCP: Barbette Reichmann, MD   Recommendations at discharge:   Follow-up PCP 5 days  Discharge Diagnoses: Principal Problem:   Sepsis due to undetermined organism [facility]) Active Problems:   Sigmoid diverticulitis   Hyponatremia   Type 2 diabetes mellitus without complications (HCC)   Essential hypertension   Hyperlipidemia   PAD (peripheral artery disease) (HCC)   Protein-calorie malnutrition, severe   Degeneration of nervous system [facility])   Diarrhea    Hospital Course: 89 year old female with past medical history of type 2 diabetes mellitus, hypertension hyperlipidemia and diverticulitis.  Patient states that she has been on antibiotics on and off since mid August.  Recently in the hospital and discharged and now back again with abdominal pain.  CT scan concerning for sigmoid diverticulitis.  9/11.  Switched antibiotics over to IV Zosyn 9/12.  When I distracted her she was not having any abdominal pain when I palpated her abdomen today. 9/13.  Patient was feeling better.  Did not want to go home too soon.  Started having diarrhea.  MiraLAX held. 9/14.  Still having diarrhea this morning. 9/15.  Patient feeling better.  Diarrhea is settling down.  Patient feels well enough to go home.  Switch over to Augmentin liquid upon discharge  Assessment and Plan: * Sepsis due to undetermined organism [facility]) Present on admission with acute sigmoid diverticulitis, leukocytosis and tachypnea.  Switch Zosyn over to Augmentin liquid upon discharge and complete another 9 days.  Sigmoid diverticulitis Zosyn switched over to Augmentin liquid.  Hyponatremia Sodium normalized  Type 2 diabetes mellitus without complications (HCC) hemoglobin A1c is 5.7.  Diarrhea MiraLAX as needed as outpatient.  Since patient  had diarrhea last 2 days I will hold off on MiraLAX daily dosing.  If patient does not have a bowel movement in 1 day then can restart.  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  As per daughter this was diagnosed at [facility].  Patient did not want any treatment.  Protein-calorie malnutrition, severe Continue supplements  PAD (peripheral artery disease) (HCC) Continue Plavix  Essential hypertension Continue Norvasc         Consultants: None Procedures performed: None Disposition: Home health Diet recommendation:  Regular diet DISCHARGE MEDICATION: Allergies as of 12/31/2022       Reactions   Latex Dermatitis   Hydrocortisone Swelling   Sulfa Antibiotics Other (See Comments)   Reaction: unknown Other reaction(s): Not available   Tape Dermatitis        Medication List     TAKE these medications    acetaminophen 325 MG tablet Commonly known as: TYLENOL Take 2 tablets (650 mg total) by mouth every 6 (six) hours as needed for mild pain, moderate pain, fever or headache (or Fever >/= 101).   amLODipine 5 MG tablet Commonly known as: NORVASC Take 5 mg by mouth at bedtime. What changed: Another medication with the same name was changed. Make sure you understand how and when to take each.   amLODipine 2.5 MG tablet Commonly known as: NORVASC Take 1 tablet (2.5 mg total) by mouth every evening. What changed: when to take this   amoxicillin-clavulanate 250-62.5 MG/5ML suspension Commonly known as: AUGMENTIN Take 10 mLs (500 mg total) by mouth 2 (two) times daily for 9 days.   CENTRUM SILVER PO Take 1  tablet by mouth daily. Reported on 06/11/2015   clopidogrel 75 MG tablet Commonly known as: PLAVIX Take 1 tablet (75 mg total) by mouth at bedtime.   cyanocobalamin 1000 MCG tablet Commonly known as: VITAMIN B12 Take 500 mcg by mouth daily.   feeding supplement Liqd Take 237 mLs by mouth 2 (two) times daily between  meals.   hydrALAZINE 25 MG tablet Commonly known as: APRESOLINE TAKE 1 TABLET BY MOUTH 3 TIMES DAILY AS NEEDED FOR SYSTOLIC BLOOD PRESSURE >160   magnesium hydroxide 400 MG/5ML suspension Commonly known as: MILK OF MAGNESIA Take 5 mLs by mouth daily as needed for mild constipation.   polyethylene glycol 17 g packet Commonly known as: MIRALAX / GLYCOLAX Take 17 g by mouth daily as needed. What changed:  when to take this reasons to take this   Vitamin D (Ergocalciferol) 1.25 MG (50000 UNIT) Caps capsule Commonly known as: DRISDOL Take 50,000 Units by mouth every 7 (seven) days.        Follow-up Information     Barbette Reichmann, MD Follow up in 5 day(s).   Specialty: Internal Medicine Contact information: [facility] Internal [facility] [address] [place] [address] [phone]                Discharge Exam: Ceasar Mons Weights   12/28/22 0527 12/30/22 0900 12/31/22 0500  Weight: 48.4 kg 48.2 kg 45.8 kg   Physical Exam HENT:     Head: Normocephalic.     Mouth/Throat:     Pharynx: No oropharyngeal exudate.  Eyes:     General: Lids are normal.     Conjunctiva/sclera: Conjunctivae normal.  Cardiovascular:     Rate and Rhythm: Normal rate and regular rhythm.     Heart sounds: Normal heart sounds, S1 normal and S2 normal.  Pulmonary:     Breath sounds: No decreased breath sounds, wheezing, rhonchi or rales.  Abdominal:     Palpations: Abdomen is soft.     Tenderness: There is no abdominal tenderness.  Musculoskeletal:     Right lower leg: No swelling.     Left lower leg: No swelling.  Skin:    General: Skin is warm.     Findings: No rash.  Neurological:     Mental Status: She is alert and oriented to person, place, and time.      Condition at discharge: stable  The results of significant diagnostics from this hospitalization (including imaging, microbiology, ancillary and laboratory) are listed below for reference.   Imaging  Studies: CT ABDOMEN PELVIS W CONTRAST  Result Date: 12/26/2022 CLINICAL DATA:  Lower abdominal pain. EXAM: CT ABDOMEN AND PELVIS WITH CONTRAST TECHNIQUE: Multidetector CT imaging of the abdomen and pelvis was performed using the standard protocol following bolus administration of intravenous contrast. RADIATION DOSE REDUCTION: This exam was performed according to the departmental dose-optimization program which includes automated exposure control, adjustment of the mA and/or kV according to patient size and/or use of iterative reconstruction technique. CONTRAST:  75mL OMNIPAQUE IOHEXOL 300 MG/ML  SOLN COMPARISON:  December 10, 2022 FINDINGS: Lower chest: No acute abnormality. Hepatobiliary: No focal liver abnormality is seen. A solitary 1.9 cm gallstone is seen within the lumen of an otherwise normal-appearing gallbladder. Pancreas: A subcentimeter simple cyst is seen within the pancreatic tail. No pancreatic ductal dilatation or surrounding inflammatory changes. Spleen: Normal in size without focal abnormality. Adrenals/Urinary Tract: Adrenal glands are unremarkable. Kidneys are normal in size, without renal calculi or hydronephrosis. Multiple subcentimeter bilateral simple renal  cysts are seen. Bladder is unremarkable. Stomach/Bowel: Stomach is within normal limits. Appendix appears normal. No evidence of bowel dilatation. Moderate to markedly inflamed diverticula are seen within the mid sigmoid colon. There is no evidence of associated perforation or abscess. Vascular/Lymphatic: Aortic atherosclerosis. No enlarged abdominal or pelvic lymph nodes. Reproductive: Status post hysterectomy. No adnexal masses. Other: No abdominal wall hernia or abnormality. No abdominopelvic ascites. Musculoskeletal: Multilevel degenerative changes are seen throughout the lumbar spine Electronically Signed   By: Aram Candela M.D.   On: 12/26/2022 00:12   DG Abd 1 View  Result Date: 12/15/2022 CLINICAL DATA:  Diarrhea.   History of diverticulitis. EXAM: ABDOMEN - 1 VIEW COMPARISON:  CT abdomen and pelvis 12/10/2022 FINDINGS: Gas is present in nondilated loops of small and large bowel as well as in the rectum without evidence of obstruction. A 1.9 cm gallstone is again noted. The bones are diffusely osteopenic. There is moderate lumbar levoscoliosis. IMPRESSION: Nonobstructed bowel gas pattern. Cholelithiasis. Electronically Signed   By: Sebastian Ache M.D.   On: 12/15/2022 09:07   DG Chest Portable 1 View  Result Date: 12/11/2022 CLINICAL DATA:  Shortness of breath and vomiting EXAM: PORTABLE CHEST 1 VIEW COMPARISON:  09/29/2020 FINDINGS: Cardiac shadow is stable. Aortic calcifications are again seen. The lungs are well aerated bilaterally. Previously seen right lung nodule is again seen and stable. No new focal abnormality is noted. No bony abnormality is seen. IMPRESSION: No acute abnormality noted. Electronically Signed   By: Alcide Clever M.D.   On: 12/11/2022 21:58   CT ABDOMEN PELVIS W CONTRAST  Result Date: 12/10/2022 CLINICAL DATA:  Left lower quadrant abdominal pain. EXAM: CT ABDOMEN AND PELVIS WITH CONTRAST TECHNIQUE: Multidetector CT imaging of the abdomen and pelvis was performed using the standard protocol following bolus administration of intravenous contrast. RADIATION DOSE REDUCTION: This exam was performed according to the departmental dose-optimization program which includes automated exposure control, adjustment of the mA and/or kV according to patient size and/or use of iterative reconstruction technique. CONTRAST:  65mL OMNIPAQUE IOHEXOL 300 MG/ML  SOLN COMPARISON:  CT abdomen and pelvis 01/13/2022 FINDINGS: Lower chest: Mild chronic subpleural reticulation in the lung bases. No acute basilar lung consolidation or pleural effusion. Hepatobiliary: No focal liver abnormality. Persistent 1.9 cm stone in the gallbladder. No acute pericholecystic inflammation or biliary dilatation. Pancreas: Unremarkable.  Spleen: Unremarkable. Adrenals/Urinary Tract: Unremarkable adrenal glands. No renal calculi or hydronephrosis. Similar appearance of subcentimeter hypodense lesions in the kidneys, too small to fully characterize and with no follow-up imaging recommended. Unremarkable bladder. Stomach/Bowel: The stomach is unremarkable. There is no evidence of bowel obstruction. There is prominent diverticulosis of the sigmoid colon with question of subtle acute inflammation (for example series 6, image 73). No extraluminal gas or fluid collection is identified. Vascular/Lymphatic: Extensive abdominal aortic atherosclerosis without aneurysm. No enlarged lymph nodes. Reproductive: Status post hysterectomy. No adnexal masses. Other: No ascites or pneumoperitoneum. Musculoskeletal: Diffuse osteopenia. Levoscoliosis and advanced disc and facet degeneration in the lumbar spine. IMPRESSION: 1. Sigmoid colon diverticulosis with possible subtle findings of acute uncomplicated diverticulitis. 2. Cholelithiasis. 3.  Aortic Atherosclerosis (ICD10-I70.0). Electronically Signed   By: Sebastian Ache M.D.   On: 12/10/2022 12:23    Microbiology: Results for orders placed or performed during the hospital encounter of 12/11/22  Resp panel by RT-PCR (RSV, Flu A&B, Covid) Anterior Nasal Swab     Status: None   Collection Time: 12/11/22  9:24 PM   Specimen: Anterior Nasal Swab  Result Value Ref  Range Status   SARS Coronavirus 2 by RT PCR NEGATIVE NEGATIVE Final    Comment: (NOTE) SARS-CoV-2 target nucleic acids are NOT DETECTED.  The SARS-CoV-2 RNA is generally detectable in upper respiratory specimens during the acute phase of infection. The lowest concentration of SARS-CoV-2 viral copies this assay can detect is 138 copies/mL. A negative result does not preclude SARS-Cov-2 infection and should not be used as the sole basis for treatment or other patient management decisions. A negative result may occur with  improper specimen  collection/handling, submission of specimen other than nasopharyngeal swab, presence of viral mutation(s) within the areas targeted by this assay, and inadequate number of viral copies(<138 copies/mL). A negative result must be combined with clinical observations, patient history, and epidemiological information. The expected result is Negative.  Fact Sheet for Patients:  BloggerCourse.com  Fact Sheet for Healthcare Providers:  SeriousBroker.it  This test is no t yet approved or cleared by the [place] FDA and  has been authorized for detection and/or diagnosis of SARS-CoV-2 by FDA under an Emergency Use Authorization (EUA). This EUA will remain  in effect (meaning this test can be used) for the duration of the COVID-19 declaration under Section 564(b)(1) of the Act, 21 U.S.C.section 360bbb-3(b)(1), unless the authorization is terminated  or revoked sooner.       Influenza A by PCR NEGATIVE NEGATIVE Final   Influenza B by PCR NEGATIVE NEGATIVE Final    Comment: (NOTE) The Xpert Xpress SARS-CoV-2/FLU/RSV plus assay is intended as an aid in the diagnosis of influenza from Nasopharyngeal swab specimens and should not be used as a sole basis for treatment. Nasal washings and aspirates are unacceptable for Xpert Xpress SARS-CoV-2/FLU/RSV testing.  Fact Sheet for Patients: BloggerCourse.com  Fact Sheet for Healthcare Providers: SeriousBroker.it  This test is not yet approved or cleared by the [place] FDA and has been authorized for detection and/or diagnosis of SARS-CoV-2 by FDA under an Emergency Use Authorization (EUA). This EUA will remain in effect (meaning this test can be used) for the duration of the COVID-19 declaration under Section 564(b)(1) of the Act, 21 U.S.C. section 360bbb-3(b)(1), unless the authorization is terminated or revoked.     Resp Syncytial  Virus by PCR NEGATIVE NEGATIVE Final    Comment: (NOTE) Fact Sheet for Patients: BloggerCourse.com  Fact Sheet for Healthcare Providers: SeriousBroker.it  This test is not yet approved or cleared by the [place] FDA and has been authorized for detection and/or diagnosis of SARS-CoV-2 by FDA under an Emergency Use Authorization (EUA). This EUA will remain in effect (meaning this test can be used) for the duration of the COVID-19 declaration under Section 564(b)(1) of the Act, 21 U.S.C. section 360bbb-3(b)(1), unless the authorization is terminated or revoked.  Performed at [facility], [address] [place], [place] [address]   Blood culture (routine x 2)     Status: None   Collection Time: 12/11/22  9:28 PM   Specimen: BLOOD  Result Value Ref Range Status   Specimen Description BLOOD BLOOD RIGHT ARM  Final   Special Requests   Final    BOTTLES DRAWN AEROBIC AND ANAEROBIC Blood Culture adequate volume   Culture   Final    NO GROWTH 5 DAYS Performed at [facility], [address], [place], [place] [address]    Report Status 12/16/2022 FINAL  Final  Blood culture (routine x 2)     Status: None   Collection Time: 12/11/22  9:28 PM   Specimen:  BLOOD  Result Value Ref Range Status   Specimen Description BLOOD BLOOD RIGHT ARM  Final   Special Requests   Final    BOTTLES DRAWN AEROBIC AND ANAEROBIC Blood Culture adequate volume   Culture   Final    NO GROWTH 5 DAYS Performed at [facility], [address] [place], [place] [address]    Report Status 12/16/2022 FINAL  Final    Labs: CBC: Recent Labs  Lab 12/25/22 2026 12/26/22 0520 12/28/22 0402 12/30/22 0356 12/31/22 0409  WBC 16.5* 12.8* 5.7 5.2 5.2  HGB 14.8 13.4 13.9 14.4 14.5  HCT 44.2 40.3 42.6 43.3 43.6  MCV 84.8 84.3 85.9 85.1 84.8  PLT 267 254 265 250 251   Basic Metabolic Panel: Recent Labs  Lab 12/25/22 2026  12/26/22 0520 12/31/22 0409  NA 132* 135 139  K 4.1 4.0 4.0  CL 97* 102 104  CO2 24 27 26   GLUCOSE 181* 109* 116*  BUN 20 16 16   CREATININE 0.60 0.53 0.54  CALCIUM 9.0 8.5* 8.7*  MG  --   --  2.1   Liver Function Tests: Recent Labs  Lab 12/25/22 2026  AST 14*  ALT 14  ALKPHOS 57  BILITOT 0.9  PROT 6.7  ALBUMIN 3.7   CBG: Recent Labs  Lab 12/28/22 1132 12/28/22 1654 12/28/22 2116 12/29/22 0754 12/29/22 1135  GLUCAP 158* 114* 150* 103* 153*    Discharge time spent: greater than 30 minutes.  Signed: Alford Highland, MD [facility] 12/31/2022

## 2023-02-19 ENCOUNTER — Other Ambulatory Visit: Payer: Self-pay | Admitting: Internal Medicine

## 2023-02-19 DIAGNOSIS — R531 Weakness: Secondary | ICD-10-CM

## 2023-02-19 DIAGNOSIS — R42 Dizziness and giddiness: Secondary | ICD-10-CM

## 2023-02-19 DIAGNOSIS — R4781 Slurred speech: Secondary | ICD-10-CM

## 2023-02-19 DIAGNOSIS — I10 Essential (primary) hypertension: Secondary | ICD-10-CM

## 2023-02-19 DIAGNOSIS — R262 Difficulty in walking, not elsewhere classified: Secondary | ICD-10-CM

## 2023-02-22 ENCOUNTER — Ambulatory Visit
Admission: RE | Admit: 2023-02-22 | Discharge: 2023-02-22 | Disposition: A | Discharge status: Home / Self Care | Payer: Medicare Other | Source: Ambulatory Visit | Attending: Internal Medicine | Admitting: Internal Medicine

## 2023-02-22 DIAGNOSIS — I10 Essential (primary) hypertension: Secondary | ICD-10-CM | POA: Insufficient documentation

## 2023-02-22 DIAGNOSIS — R262 Difficulty in walking, not elsewhere classified: Secondary | ICD-10-CM | POA: Insufficient documentation

## 2023-02-22 DIAGNOSIS — R4781 Slurred speech: Secondary | ICD-10-CM | POA: Insufficient documentation

## 2023-02-22 DIAGNOSIS — R42 Dizziness and giddiness: Secondary | ICD-10-CM | POA: Insufficient documentation

## 2023-02-22 DIAGNOSIS — R531 Weakness: Secondary | ICD-10-CM | POA: Insufficient documentation

## 2023-03-10 NOTE — Therapy (Signed)
OUTPATIENT PHYSICAL THERAPY VESTIBULAR EVALUATION  Patient Name: Claire Washington MRN: [id number] DOB:1924/07/29, 89 y.o., female Today's Date: 03/13/2023  END OF SESSION:  PT End of Session - 03/13/23 0837     Visit Number 1    Number of Visits 17    Date for PT Re-Evaluation 05/08/23    Authorization Type eval: 03/13/23    PT Start Time 0845    PT Stop Time 0930    PT Time Calculation (min) 45 min    Activity Tolerance Patient tolerated treatment well    Behavior During Therapy [facility] for tasks assessed/performed            Past Medical History:  Diagnosis Date   Arthritis    Basal cell carcinoma    basel cell   Chronic cystitis    Diabetes mellitus type 2, controlled, without complications (HCC)    type 2 or unspecified type diabetes mellitus without mention of complication, not stated as uncontrolled   Disease of thyroid gland    Diverticulitis    Embolic stroke (HCC)    a. 07/2016 MRI Brain: small right superior cerebellum and left lateral occipital cortex infarcts;  b. 08/2016 Echo: EF 60-65%, Gr1 DD, mild MR;  c. 08/2016 30 Day Event Monitor: No significant tachy/bradyarrhythmias. No afib.   Follicular neoplasm of thyroid    Hemorrhage of rectum and anus    Hyperlipidemia    Hyperlipidemia, unspecified    Hypertension    Leg pain    Osteoarthritis    Pernicious anemia    Small vessel disease (HCC)    Spinal stenosis, lumbar region without neurogenic claudication    Subdural hematoma (HCC)    a. 2013 in setting of syncope.   Syncope and collapse    a. 2013 - syncope and fall w/ resultant subdural hematoma; b. prev nl echo and stress test @ [facility].   Thyroid nodule    Vertigo & Chronic Dizziness    a. Previously seen by ENT with Physical Therapy.  MRI 2000 small vessel disease   Past Surgical History:  Procedure Laterality Date   BILATERAL SALPINGOOPHORECTOMY     BREAST CYST EXCISION     ? side, yrs ago   COLONOSCOPY  01/26/2009   Diverticulosis    CYSTOSCOPY     HEMORRHOIDECTOMY BY SIMPLE LIGATION     PR COLONOSCOPY FLX W/ENDOSCOPIC MUCOSAL RESECTION  09/02/2014   Procedure: COLONOSCOPY, FLEXIBLE; WITH ENDOSCOPIC MUCOSAL RESECTION; Surgeon: Mayford Knife, MD; [facility]; Service: Gastroenterology   PR COLSC FLX W/RMVL OF TUMOR POLYP LESION SNARE TQ  09/02/2014   Procedure: COLONOSCOPY FLEX; W/REMOV TUMOR/LES BY SNARE; Surgeon: Mayford Knife, MD; [facility]; Service: Gastroenterology   SKIN SURGERY     TOTAL ABDOMINAL HYSTERECTOMY W/ BILATERAL SALPINGOOPHORECTOMY  1970s   Patient Active Problem List   Diagnosis Date Noted   Diarrhea 12/30/2022   Degeneration of nervous system (HCC) 12/28/2022   PAD (peripheral artery disease) (HCC) 12/26/2022   Protein-calorie malnutrition, severe 12/26/2022   Sepsis due to undetermined organism (HCC) 12/12/2022   Type 2 diabetes mellitus without complications (HCC) 12/12/2022   Acute diverticulitis 12/11/2022   Acute respiratory failure with hypoxia (HCC) 01/18/2022   Nausea vomiting and diarrhea 01/14/2022   Sigmoid diverticulitis 01/13/2022   Hyponatremia 08/31/2020   Lymphedema 03/09/2019   Swelling of limb 02/28/2019   Cellulitis of left leg 12/31/2018   Hip pain, bilateral 12/31/2018   Cellulitis 12/27/2018   Carotid stenosis  12/28/2017   Vertigo 11/30/2017   Imbalance due to old stroke 11/03/2016   Gait instability 10/19/2016   Ataxia 08/30/2016   History of stroke 08/30/2016   Dizziness 11/03/2015   Abnormal PET scan of colon 06/18/2014   H/O seasonal allergies 06/04/2014   Lung nodule, solitary 06/04/2014   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   Essential hypertension 12/04/2011   Hyperlipidemia 12/04/2011   Syncope 12/04/2011   Diabetes mellitus (HCC) 12/04/2011   Smoking hx 12/04/2011   PCP: Barbette Reichmann, MD  REFERRING PROVIDER: Barbette Reichmann, MD   REFERRING DIAG: Dizziness and  giddiness  RATIONALE FOR EVALUATION AND TREATMENT: Rehabilitation  THERAPY DIAG: Dizziness and giddiness  ONSET DATE: "a couple months ago"  FOLLOW-UP APPT SCHEDULED WITH REFERRING PROVIDER: Yes, sees PCP every 4 months   SUBJECTIVE:   Chief Complaint:  Dizziness and imbalance  Pertinent History Pt has a history of chronic dizziness. She reports that a couple months ago she started to have intermittent bouts of severe dizziness. She went to the ENT and audiologist diagnosed her with BPPV. She reports that she was treated for R sided BPPV with CRT and advised that it was fully resolved. However her symptoms have persisted and now she remains relatively sedentary for fear of triggering her symptoms. She was receiving HH PT but was advised that in order to treat her dizziness she would need to see a therapist who specializes in vestibular disorders. She has had a few episodes where the dizziness occurred concurrently with dysarthria. Brain MRI from 02/22/2023 did not show any acute intracranial abnormalities.  She has a history of a chronic infarct in her inferior right cerebellum as well as generalized cerebral and cerebellar atrophy.  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 She also complains of progressive weakness as well as multiple hospitalizations this year for diverticulitis.  No recent URI. Pt is known to this clinic having been treated for BPPV multiple times in the past.   Brain MRI 02/22/23: IMPRESSION: 1. No evidence of an acute intracranial abnormality. 2. Moderate chronic small vessel ischemic changes within the cerebral white matter, similar to the prior brain MRI of 07/02/2020. 3. Tiny chronic infarct within the inferior right cerebellar hemisphere, unchanged. 4. Generalized cerebral and cerebellar atrophy.  Description of dizziness: Underlying constant dizziness along  with intermittent bouts of intense episodes Frequency: Everyday Duration: Pt unsure Symptom nature: motion provoked Progression of symptoms since onset: worse History of similar episodes: Yes  Provocative Factors: bending forward, turning head, rolling over Easing Factors: rest and avoiding provoking movements  Auditory complaints (tinnitus, pain, drainage, hearing loss, aural fullness): No, wears hearing aids bilaterally. Vision changes (diplopia, visual field loss, recent changes, recent eye exam): Yes, general worsening vision, cataracts, no double vision or visual field loss Chest pain/palpitations: No History of head injury/concussion: No Stress/anxiety: No Migraines/headaches: No Nausea/vomiting: No Numbness/tingling: No Focal weakness: No Dysarthria/dysphagia/drop attacks: Yes, a couple episodes of dysarthria. MD aware and MRI performed (see above)  Has patient fallen in last 6 months? No Pertinent pain: Yes, mild neck pain Dominant hand: right Imaging: Yes  Prior level of function: Independent  Red Flags: Negative for sudden bowel/bladder changes, chills/fever, night sweats, unexplained weight loss/gain  PRECAUTIONS: None  WEIGHT BEARING RESTRICTIONS No  LIVING ENVIRONMENT: Lives with: lives alone Lives in: [employer], independent living at [facility] [employer] Stairs: no Has following equipment at home: Dan Humphreys -  4 wheeled, power wheelchair, walk-in shower with grab bars and seat  PATIENT GOALS Decrease dizziness   OBJECTIVE EXAMINATION  POSTURE: No gross deficits contributing to symptoms  NEUROLOGICAL SCREEN: (2+ unless otherwise noted.) N=normal  Ab=abnormal  Level Dermatome R L Myotome R L Reflex R L  C3 Anterior Neck N N Sidebend C2-3 N N Jaw CN V    C4 Top of Shoulder N N Shoulder Shrug C4 N N Hoffman's UMN    C5 Lateral Upper Arm N N Shoulder ABD C4-5 N N Biceps C5-6    C6 Lateral Arm/ Thumb N N Arm Flex/ Wrist Ext C5-6 N N Brachiorad. C5-6    C7 Middle  Finger N N Arm Ext//Wrist Flex C6-7 N N Triceps C7    C8 4th & 5th Finger N N Flex/ Ext Carpi Ulnaris C8 N N Patellar (L3-4)    T1 Medial Arm N N Interossei T1 N N Gastrocnemius    L2 Medial thigh/groin N N Illiopsoas (L2-3) N N     L3 Lower thigh/med.knee N N Quadriceps (L3-4) N N     L4 Medial leg/lat thigh N N Tibialis Ant (L4-5) N N     L5 Lat. leg & dorsal foot N N EHL (L5) N N     S1 post/lat foot/thigh/leg N N Gastrocnemius (S1-2) N N     S2 Post./med. thigh & leg N N Hamstrings (L4-S3) N N      CRANIAL NERVES II, III, IV, VI: Pupils equal and reactive to light, visual acuity and visual fields are intact, extraocular muscles are intact  V: Facial sensation is intact and symmetric bilaterally  VII: Facial strength is intact and symmetric bilaterally  VIII: Hearing is normal as tested by gross conversation IX, X: Palate elevates midline, normal phonation, uvula midline XI: Shoulder shrug strength is intact  XII: Tongue protrudes midline   COORDINATION Finger to Nose: Mild dysmetria noted LUE Heel to Shin: Deferred Pronator Drift: Deferred Rapid Alternating Movements: Normal Finger to Thumb Opposition: Normal   RANGE OF MOTION Cervical Spine AROM limited in all directions but no pain reported. No functional focal deficits in AROM noted in BUE/BLE  MANUAL MUSCLE TESTING BUE/BLE strength WNL without focal deficits.  She reports some left lower extremity weakness which is chronic.  TRANSFERS/GAIT Independent for transfers and ambulation with rollator  PATIENT SURVEYS FOTO: 39, predicted improvement to 54 ABC: To be completed  OCULOMOTOR / VESTIBULAR TESTING  Oculomotor Exam- Room Light  Findings Comments  Ocular Alignment normal   Ocular ROM normal   Spontaneous Nystagmus normal   Gaze-Holding Nystagmus normal   End-Gaze Nystagmus normal   Vergence (normal 2-3") not examined   Smooth Pursuit abnormal Saccadic intrusions  Cross-Cover Test abnormal Consistent medial  correction on both sides but no vertical correction  Saccades abnormal Overshooting and undershooting with multiple corrective saccades  VOR Cancellation abnormal Saccades and dizziness  Left Head Impulse not examined   Right Head Impulse not examined   Static Acuity not examined   Dynamic Acuity not examined    Oculomotor Exam- Fixation Suppressed: Deferred  BPPV TESTS (utilized inverted mat table)  Symptoms Duration Intensity Nystagmus  L Dix-Hallpike None   Persistent downbeating  R Dix-Hallpike Dizziness 10-15s Mild Downbeats followed by some R torsional beats  L Head Roll Dizziness   Persistent downbeating  R Head Roll Dizziness   Persistent downbeating  L Sidelying Test      R Sidelying Test      (blank =  not tested)  Clinical Test of Sensory Interaction for Balance (CTSIB): Deferred  FUNCTIONAL OUTCOME MEASURES  Results Comments  BERG    DGI    FGA    TUG    5TSTS    6 Minute Walk Test    10 Meter Gait Speed    (blank = not tested)   TODAY'S TREATMENT   Canalith Repositioning Treatment Pt treated with 1 bout of R Epley Maneuver. One minute holds in each position.  Patient denies acute worsening of dizziness throughout maneuver.  PATIENT EDUCATION:  Education details: Plan of care Person educated: Patient Education method: Explanation Education comprehension: verbalized understanding   HOME EXERCISE PROGRAM:  None currently   ASSESSMENT: CLINICAL IMPRESSION: Patient is a 89 y.o. female who was seen today for physical therapy evaluation and treatment for dizziness.  Examination today is not overly convincing for BPPV.  Patient does have a persisting vertical downbeating nystagmus which is consistent with central etiology of symptoms.  However, given recent treatment for what patient reports is right sided BPPV and a few right torsional beats noted in the Hallpike position patient taken through 1 bout of the Epley maneuver today.  Plan to retest at follow-up  sessions as well as perform additional vestibular/oculomotor, general balance, and strength assessments. Pt will benefit from PT services to address deficits in dizziness, balance, strength, and mobility in order to return to full function at home, decrease symptoms, and decrease risk for falls.  OBJECTIVE IMPAIRMENTS: Abnormal gait, decreased balance, difficulty walking, decreased strength, and dizziness.   ACTIVITY LIMITATIONS: bending, standing, transfers, and bed mobility  PARTICIPATION LIMITATIONS: meal prep, cleaning, shopping, and community activity  PERSONAL FACTORS: Age, Time since onset of injury/illness/exacerbation, and 3+ comorbidities: cerebellar CVA, OA, spinal stenosis, and diverticulitis  are also affecting patient's functional outcome.   REHAB POTENTIAL: Fair    CLINICAL DECISION MAKING: Unstable/unpredictable  EVALUATION COMPLEXITY: High   GOALS:  SHORT TERM GOALS: Target date: 04/10/2023  Pt will be independent with HEP for dizziness in order to decrease symptoms, improve balance,decrease fall risk, and improve function at home. Baseline: Goal status: INITIAL   LONG TERM GOALS: Target date: 05/08/2023  Pt will increase FOTO to at least 57 to demonstrate significant improvement in function at home related to dizziness.  Baseline: 39 Goal status: INITIAL  2.  Pt will improve ABC by at least 13% in order to demonstrate clinically significant improvement in balance confidence.      Baseline: To be completed Goal status: INITIAL  3. Pt will decrease 5TSTS by at least 3 seconds in order to demonstrate clinically significant improvement in LE strength      Baseline: To be completed Goal status: INITIAL  4. Pt will improve BERG by at least 3 points in order to demonstrate clinically significant improvement in balance.       Baseline: To be completed Goal status: INITIAL  5. Pt will decrease TUG to below 14 seconds/decrease in order to demonstrate decreased fall  risk.  Baseline: To be completed Goal status: INITIAL    PLAN: PT FREQUENCY: 1-2x/week  PT DURATION: 8 weeks  PLANNED INTERVENTIONS: Therapeutic exercises, Therapeutic activity, Neuromuscular re-education, Balance training, Gait training, Patient/Family education, Self Care, Joint mobilization, Joint manipulation, Vestibular training, Canalith repositioning, Orthotic/Fit training, DME instructions, Dry Needling, Electrical stimulation, Spinal manipulation, Spinal mobilization, Cryotherapy, Moist heat, Taping, Traction, Ultrasound, Ionotophoresis 4mg /ml Dexamethasone, Manual therapy, and Re-evaluation.  PLAN FOR NEXT SESSION: Have pt complete ABC, repeat BPPV testing and treat if indicated,  TUG, 5TSTS, BERG, initiate balance and strength exercises   Sharalyn Ink Edrick Whitehorn PT, DPT, GCS  Brae Gartman, PT 03/13/2023, 8:37 AM

## 2023-03-13 ENCOUNTER — Ambulatory Visit: Payer: Medicare Other | Attending: Internal Medicine

## 2023-03-13 DIAGNOSIS — R42 Dizziness and giddiness: Secondary | ICD-10-CM | POA: Diagnosis present

## 2023-03-16 ENCOUNTER — Other Ambulatory Visit: Payer: Self-pay | Admitting: Cardiovascular Disease

## 2023-03-21 NOTE — Therapy (Signed)
OUTPATIENT PHYSICAL THERAPY VESTIBULAR TREATMENT  Patient Name: Claire Washington MRN: [id number] DOB:Jan 16, 1925, 89 y.o., female Today's Date: 03/22/2023  END OF SESSION:  PT End of Session - 03/22/23 1155     Visit Number 2    Number of Visits 17    Date for PT Re-Evaluation 05/08/23    Authorization Type eval: 03/13/23    PT Start Time 1156    PT Stop Time 1227    PT Time Calculation (min) 31 min    Activity Tolerance Patient tolerated treatment well    Behavior During Therapy WFL for tasks assessed/performed            Past Medical History:  Diagnosis Date   Arthritis    Basal cell carcinoma    basel cell   Chronic cystitis    Diabetes mellitus type 2, controlled, without complications (HCC)    type 2 or unspecified type diabetes mellitus without mention of complication, not stated as uncontrolled   Disease of thyroid gland    Diverticulitis    Embolic stroke (HCC)    a. 07/2016 MRI Brain: small right superior cerebellum and left lateral occipital cortex infarcts;  b. 08/2016 Echo: EF 60-65%, Gr1 DD, mild MR;  c. 08/2016 30 Day Event Monitor: No significant tachy/bradyarrhythmias. No afib.   Follicular neoplasm of thyroid    Hemorrhage of rectum and anus    Hyperlipidemia    Hyperlipidemia, unspecified    Hypertension    Leg pain    Osteoarthritis    Pernicious anemia    Small vessel disease (HCC)    Spinal stenosis, lumbar region without neurogenic claudication    Subdural hematoma (HCC)    a. 2013 in setting of syncope.   Syncope and collapse    a. 2013 - syncope and fall w/ resultant subdural hematoma; b. prev nl echo and stress test @ [facility].   Thyroid nodule    Vertigo & Chronic Dizziness    a. Previously seen by ENT with Physical Therapy.  MRI 2000 small vessel disease   Past Surgical History:  Procedure Laterality Date   BILATERAL SALPINGOOPHORECTOMY     BREAST CYST EXCISION     ? side, yrs ago   COLONOSCOPY  01/26/2009   Diverticulosis    CYSTOSCOPY     HEMORRHOIDECTOMY BY SIMPLE LIGATION     PR COLONOSCOPY FLX W/ENDOSCOPIC MUCOSAL RESECTION  09/02/2014   Procedure: COLONOSCOPY, FLEXIBLE; WITH ENDOSCOPIC MUCOSAL RESECTION; Surgeon: Mayford Knife, MD; Location: [facility]; Service: Gastroenterology   PR COLSC FLX W/RMVL OF TUMOR POLYP LESION SNARE TQ  09/02/2014   Procedure: COLONOSCOPY FLEX; W/REMOV TUMOR/LES BY SNARE; Surgeon: Mayford Knife, MD; [facility]; Service: Gastroenterology   SKIN SURGERY     TOTAL ABDOMINAL HYSTERECTOMY W/ BILATERAL SALPINGOOPHORECTOMY  1970s   Patient Active Problem List   Diagnosis Date Noted   Diarrhea 12/30/2022   Degeneration of nervous system (HCC) 12/28/2022   PAD (peripheral artery disease) (HCC) 12/26/2022   Protein-calorie malnutrition, severe 12/26/2022   Sepsis due to undetermined organism (HCC) 12/12/2022   Type 2 diabetes mellitus without complications (HCC) 12/12/2022   Acute diverticulitis 12/11/2022   Acute respiratory failure with hypoxia (HCC) 01/18/2022   Nausea vomiting and diarrhea 01/14/2022   Sigmoid diverticulitis 01/13/2022   Hyponatremia 08/31/2020   Lymphedema 03/09/2019   Swelling of limb 02/28/2019   Cellulitis of left leg 12/31/2018   Hip pain, bilateral 12/31/2018   Cellulitis 12/27/2018   Carotid stenosis  12/28/2017   Vertigo 11/30/2017   Imbalance due to old stroke 11/03/2016   Gait instability 10/19/2016   Ataxia 08/30/2016   History of stroke 08/30/2016   Dizziness 11/03/2015   Abnormal PET scan of colon 06/18/2014   H/O seasonal allergies 06/04/2014   Lung nodule, solitary 06/04/2014   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   Essential hypertension 12/04/2011   Hyperlipidemia 12/04/2011   Syncope 12/04/2011   Diabetes mellitus (HCC) 12/04/2011   Smoking hx 12/04/2011   PCP: Barbette Reichmann, MD  REFERRING PROVIDER: Barbette Reichmann, MD   REFERRING DIAG: Dizziness and  giddiness  RATIONALE FOR EVALUATION AND TREATMENT: Rehabilitation  THERAPY DIAG: Dizziness and giddiness  ONSET DATE: "a couple months ago"  FOLLOW-UP APPT SCHEDULED WITH REFERRING PROVIDER: Yes, sees PCP every 4 months  FROM INITIAL EVALUATION SUBJECTIVE:   Chief Complaint:  Dizziness and imbalance  Pertinent History Pt has a history of chronic dizziness. She reports that a couple months ago she started to have intermittent bouts of severe dizziness. She went to the ENT and audiologist diagnosed her with BPPV. She reports that she was treated for R sided BPPV with CRT and advised that it was fully resolved. However her symptoms have persisted and now she remains relatively sedentary for fear of triggering her symptoms. She was receiving HH PT but was advised that in order to treat her dizziness she would need to see a therapist who specializes in vestibular disorders. She has had a few episodes where the dizziness occurred concurrently with dysarthria. Brain MRI from 02/22/2023 did not show any acute intracranial abnormalities.  She has a history of a chronic infarct in her inferior right cerebellum as well as generalized cerebral and cerebellar atrophy.  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 She also complains of progressive weakness as well as multiple hospitalizations this year for diverticulitis.  No recent URI. Pt is known to this clinic having been treated for BPPV multiple times in the past.   Brain MRI 02/22/23: IMPRESSION: 1. No evidence of an acute intracranial abnormality. 2. Moderate chronic small vessel ischemic changes within the cerebral white matter, similar to the prior brain MRI of 07/02/2020. 3. Tiny chronic infarct within the inferior right cerebellar hemisphere, unchanged. 4. Generalized cerebral and cerebellar atrophy.  Description of dizziness: Underlying  constant dizziness along with intermittent bouts of intense episodes Frequency: Everyday Duration: Pt unsure Symptom nature: motion provoked Progression of symptoms since onset: worse History of similar episodes: Yes  Provocative Factors: bending forward, turning head, rolling over Easing Factors: rest and avoiding provoking movements  Auditory complaints (tinnitus, pain, drainage, hearing loss, aural fullness): No, wears hearing aids bilaterally. Vision changes (diplopia, visual field loss, recent changes, recent eye exam): Yes, general worsening vision, cataracts, no double vision or visual field loss Chest pain/palpitations: No History of head injury/concussion: No Stress/anxiety: No Migraines/headaches: No Nausea/vomiting: No Numbness/tingling: No Focal weakness: No Dysarthria/dysphagia/drop attacks: Yes, a couple episodes of dysarthria. MD aware and MRI performed (see above)  Has patient fallen in last 6 months? No Pertinent pain: Yes, mild neck pain Dominant hand: right Imaging: Yes  Prior level of function: Independent  Red Flags: Negative for sudden bowel/bladder changes, chills/fever, night sweats, unexplained weight loss/gain  PRECAUTIONS: None  WEIGHT BEARING RESTRICTIONS No  LIVING ENVIRONMENT: Lives with: lives alone Lives in: [place], independent living at [employer] Stairs: no Has following equipment at home:  Walker - 4 wheeled, power wheelchair, walk-in shower with grab bars and seat  PATIENT GOALS Decrease dizziness   OBJECTIVE EXAMINATION  POSTURE: No gross deficits contributing to symptoms  NEUROLOGICAL SCREEN: (2+ unless otherwise noted.) N=normal  Ab=abnormal  Level Dermatome R L Myotome R L Reflex R L  C3 Anterior Neck N N Sidebend C2-3 N N Jaw CN V    C4 Top of Shoulder N N Shoulder Shrug C4 N N Hoffman's UMN    C5 Lateral Upper Arm N N Shoulder ABD C4-5 N N Biceps C5-6    C6 Lateral Arm/ Thumb N N Arm Flex/ Wrist Ext C5-6 N N  Brachiorad. C5-6    C7 Middle Finger N N Arm Ext//Wrist Flex C6-7 N N Triceps C7    C8 4th & 5th Finger N N Flex/ Ext Carpi Ulnaris C8 N N Patellar (L3-4)    T1 Medial Arm N N Interossei T1 N N Gastrocnemius    L2 Medial thigh/groin N N Illiopsoas (L2-3) N N     L3 Lower thigh/med.knee N N Quadriceps (L3-4) N N     L4 Medial leg/lat thigh N N Tibialis Ant (L4-5) N N     L5 Lat. leg & dorsal foot N N EHL (L5) N N     S1 post/lat foot/thigh/leg N N Gastrocnemius (S1-2) N N     S2 Post./med. thigh & leg N N Hamstrings (L4-S3) N N      CRANIAL NERVES II, III, IV, VI: Pupils equal and reactive to light, visual acuity and visual fields are intact, extraocular muscles are intact  V: Facial sensation is intact and symmetric bilaterally  VII: Facial strength is intact and symmetric bilaterally  VIII: Hearing is normal as tested by gross conversation IX, X: Palate elevates midline, normal phonation, uvula midline XI: Shoulder shrug strength is intact  XII: Tongue protrudes midline   COORDINATION Finger to Nose: Mild dysmetria noted LUE Heel to Shin: Deferred Pronator Drift: Deferred Rapid Alternating Movements: Normal Finger to Thumb Opposition: Normal   RANGE OF MOTION Cervical Spine AROM limited in all directions but no pain reported. No functional focal deficits in AROM noted in BUE/BLE  MANUAL MUSCLE TESTING BUE/BLE strength WNL without focal deficits.  She reports some left lower extremity weakness which is chronic.  TRANSFERS/GAIT Independent for transfers and ambulation with rollator  PATIENT SURVEYS FOTO: 39, predicted improvement to 54 ABC: To be completed  OCULOMOTOR / VESTIBULAR TESTING  Oculomotor Exam- Room Light  Findings Comments  Ocular Alignment normal   Ocular ROM normal   Spontaneous Nystagmus normal   Gaze-Holding Nystagmus normal   End-Gaze Nystagmus normal   Vergence (normal 2-3") not examined   Smooth Pursuit abnormal Saccadic intrusions  Cross-Cover  Test abnormal Consistent medial correction on both sides but no vertical correction  Saccades abnormal Overshooting and undershooting with multiple corrective saccades  VOR Cancellation abnormal Saccades and dizziness  Left Head Impulse not examined   Right Head Impulse not examined   Static Acuity not examined   Dynamic Acuity not examined    Oculomotor Exam- Fixation Suppressed: Deferred  BPPV TESTS (utilized inverted mat table)  Symptoms Duration Intensity Nystagmus  L Dix-Hallpike None   Persistent downbeating  R Dix-Hallpike Dizziness 10-15s Mild Downbeats followed by some R torsional beats  L Head Roll Dizziness   Persistent downbeating  R Head Roll Dizziness   Persistent downbeating  L Sidelying Test      R Sidelying Test      (  blank = not tested)  Clinical Test of Sensory Interaction for Balance (CTSIB): Deferred  FUNCTIONAL OUTCOME MEASURES  Results Comments  BERG    DGI    FGA    TUG    5TSTS    6 Minute Walk Test    10 Meter Gait Speed    (blank = not tested)   TODAY'S TREATMENT    SUBJECTIVE: Pt reports that she is doing well today. Has noticed improvement since the initial evaluation without any additional bouts of vertigo. Her baseline chronic dizziness persists. No specific questions or concerns currently.    PAIN: Unrelated to episode   Canalith Repositioning Treatment BPPV testing is negative bilaterally for both vertigo and appropriate nystagmus. Pt still presents with her chronic downbeating vertical nystagmus in the Dix-Hallpike position bilaterally. Given positive response from last session pt treated with 1 bout of R Epley Maneuver. Two minute holds in each position.  Patient denies acute worsening of dizziness throughout maneuver.   Neuromuscular Re-education  Updated additional outcome measures:  FUNCTIONAL OUTCOME MEASURES  Results Comments  BERG 14/56 Very high fall risk  DGI    FGA    TUG 17.6s Increased fall risk  5TSTS 19.0s  Increased fall risk  6 Minute Walk Test    10 Meter Gait Speed Self-selected: 17.6s = 0.57 m/s, fastest: 15.4s = 0.65 m/s Below normative values for community ambulation  (blank = not tested)   PATIENT EDUCATION:  Education details: Plan of care Person educated: Patient Education method: Explanation Education comprehension: verbalized understanding   HOME EXERCISE PROGRAM:  None currently   ASSESSMENT: CLINICAL IMPRESSION: BPPV testing is negative bilaterally for both vertigo and appropriate nystagmus. Pt still presents with her chronic downbeating vertical nystagmus in the Dix-Hallpike position bilaterally. Given positive response from last session pt treated with 1 bout of R Epley Maneuver. Two minute holds in each position.  Patient denies acute worsening of dizziness throughout maneuver. Performed additional outcome measures with patient during visit today. She is a very high fall risk with a BERG of 14/56. Patient reports no more episodes of vertigo and all BPPV testing is negative today. She would like to continue working with therapy to improve strength and balance. Pt prefers to go to the [facility] main clinic given the proximity to her home and financial constraints of hiring a driver to bring her to [place]. Pt will be transferred to the main clinic. She will need a HEP from her new therapist. Pt will benefit from PT services to address deficits in balance, strength, and mobility in order to return to full function at home and decrease her risk for falls.  OBJECTIVE IMPAIRMENTS: Abnormal gait, decreased balance, difficulty walking, decreased strength, and dizziness.   ACTIVITY LIMITATIONS: bending, standing, transfers, and bed mobility  PARTICIPATION LIMITATIONS: meal prep, cleaning, shopping, and community activity  PERSONAL FACTORS: Age, Time since onset of injury/illness/exacerbation, and 3+ comorbidities: cerebellar CVA, OA, spinal stenosis, and diverticulitis  are also affecting  patient's functional outcome.   REHAB POTENTIAL: Fair    CLINICAL DECISION MAKING: Unstable/unpredictable  EVALUATION COMPLEXITY: High   GOALS:  SHORT TERM GOALS: Target date: 04/10/2023  Pt will be independent with HEP for dizziness in order to decrease symptoms, improve balance,decrease fall risk, and improve function at home. Baseline: Goal status: INITIAL   LONG TERM GOALS: Target date: 05/08/2023  Pt will increase FOTO to at least 57 to demonstrate significant improvement in function at home related to dizziness.  Baseline: 39 Goal status: INITIAL  2.  Pt will improve ABC by at least 13% in order to demonstrate clinically significant improvement in balance confidence.      Baseline: To be completed Goal status: INITIAL  3. Pt will decrease 5TSTS by at least 3 seconds in order to demonstrate clinically significant improvement in LE strength      Baseline: 19.0s Goal status: INITIAL  4. Pt will improve BERG by at least 3 points in order to demonstrate clinically significant improvement in balance.       Baseline: 14/56 Goal status: INITIAL  5. Pt will decrease TUG to below 14 seconds/decrease in order to demonstrate decreased fall risk.  Baseline: 17.6s Goal status: INITIAL    PLAN: PT FREQUENCY: 1-2x/week  PT DURATION: 8 weeks  PLANNED INTERVENTIONS: Therapeutic exercises, Therapeutic activity, Neuromuscular re-education, Balance training, Gait training, Patient/Family education, Self Care, Joint mobilization, Joint manipulation, Vestibular training, Canalith repositioning, Orthotic/Fit training, DME instructions, Dry Needling, Electrical stimulation, Spinal manipulation, Spinal mobilization, Cryotherapy, Moist heat, Taping, Traction, Ultrasound, Ionotophoresis 4mg /ml Dexamethasone, Manual therapy, and Re-evaluation.  PLAN FOR NEXT SESSION: Have pt complete ABC, initiate balance and strength exercises, issue HEP   Sharalyn Ink Shela Esses PT, DPT, GCS  Emanii Bugbee,  PT 03/22/2023, 1:43 PM

## 2023-03-22 ENCOUNTER — Ambulatory Visit: Payer: Medicare Other | Attending: Internal Medicine

## 2023-03-22 DIAGNOSIS — R42 Dizziness and giddiness: Secondary | ICD-10-CM | POA: Diagnosis present

## 2023-03-22 DIAGNOSIS — R2681 Unsteadiness on feet: Secondary | ICD-10-CM | POA: Diagnosis present

## 2023-03-27 ENCOUNTER — Ambulatory Visit: Payer: Medicare Other

## 2023-03-27 DIAGNOSIS — R2681 Unsteadiness on feet: Secondary | ICD-10-CM

## 2023-03-27 DIAGNOSIS — R42 Dizziness and giddiness: Secondary | ICD-10-CM

## 2023-03-27 NOTE — Therapy (Signed)
OUTPATIENT PHYSICAL THERAPY TREATMENT  Patient Name: Claire Washington MRN: [id number] DOB:06-05-1924, 89 y.o., female Today's Date: 03/27/2023  END OF SESSION:  PT End of Session - 03/27/23 1453     Visit Number 3    Number of Visits 17    Date for PT Re-Evaluation 05/08/23    Authorization Type Medicare A & B    PT Start Time 1445    PT Stop Time 1525    PT Time Calculation (min) 40 min    Activity Tolerance Patient tolerated treatment well;No increased pain    Behavior During Therapy WFL for tasks assessed/performed            Past Medical History:  Diagnosis Date   Arthritis    Basal cell carcinoma    basel cell   Chronic cystitis    Diabetes mellitus type 2, controlled, without complications (HCC)    type 2 or unspecified type diabetes mellitus without mention of complication, not stated as uncontrolled   Disease of thyroid gland    Diverticulitis    Embolic stroke (HCC)    a. 07/2016 MRI Brain: small right superior cerebellum and left lateral occipital cortex infarcts;  b. 08/2016 Echo: EF 60-65%, Gr1 DD, mild MR;  c. 08/2016 30 Day Event Monitor: No significant tachy/bradyarrhythmias. No afib.   Follicular neoplasm of thyroid    Hemorrhage of rectum and anus    Hyperlipidemia    Hyperlipidemia, unspecified    Hypertension    Leg pain    Osteoarthritis    Pernicious anemia    Small vessel disease (HCC)    Spinal stenosis, lumbar region without neurogenic claudication    Subdural hematoma (HCC)    a. 2013 in setting of syncope.   Syncope and collapse    a. 2013 - syncope and fall w/ resultant subdural hematoma; b. prev nl echo and stress test @ [facility].   Thyroid nodule    Vertigo & Chronic Dizziness    a. Previously seen by ENT with Physical Therapy.  MRI 2000 small vessel disease   Past Surgical History:  Procedure Laterality Date   BILATERAL SALPINGOOPHORECTOMY     BREAST CYST EXCISION     ? side, yrs ago   COLONOSCOPY  01/26/2009    Diverticulosis   CYSTOSCOPY     HEMORRHOIDECTOMY BY SIMPLE LIGATION     PR COLONOSCOPY FLX W/ENDOSCOPIC MUCOSAL RESECTION  09/02/2014   Procedure: COLONOSCOPY, FLEXIBLE; WITH ENDOSCOPIC MUCOSAL RESECTION; Surgeon: Mayford Knife, MD; [facility]; Service: Gastroenterology   PR COLSC FLX W/RMVL OF TUMOR POLYP LESION SNARE TQ  09/02/2014   Procedure: COLONOSCOPY FLEX; W/REMOV TUMOR/LES BY SNARE; Surgeon: Mayford Knife, MD; Location: [facility]; Service: Gastroenterology   SKIN SURGERY     TOTAL ABDOMINAL HYSTERECTOMY W/ BILATERAL SALPINGOOPHORECTOMY  1970s   Patient Active Problem List   Diagnosis Date Noted   Diarrhea 12/30/2022   Degeneration of nervous system (HCC) 12/28/2022   PAD (peripheral artery disease) (HCC) 12/26/2022   Protein-calorie malnutrition, severe 12/26/2022   Sepsis due to undetermined organism (HCC) 12/12/2022   Type 2 diabetes mellitus without complications (HCC) 12/12/2022   Acute diverticulitis 12/11/2022   Acute respiratory failure with hypoxia (HCC) 01/18/2022   Nausea vomiting and diarrhea 01/14/2022   Sigmoid diverticulitis 01/13/2022   Hyponatremia 08/31/2020   Lymphedema 03/09/2019   Swelling of limb 02/28/2019   Cellulitis of left leg 12/31/2018   Hip pain, bilateral 12/31/2018   Cellulitis 12/27/2018  Carotid stenosis 12/28/2017   Vertigo 11/30/2017   Imbalance due to old stroke 11/03/2016   Gait instability 10/19/2016   Ataxia 08/30/2016   History of stroke 08/30/2016   Dizziness 11/03/2015   Abnormal PET scan of colon 06/18/2014   H/O seasonal allergies 06/04/2014   Lung nodule, solitary 06/04/2014   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   Essential hypertension 12/04/2011   Hyperlipidemia 12/04/2011   Syncope 12/04/2011   Diabetes mellitus (HCC) 12/04/2011   Smoking hx 12/04/2011   PCP: Barbette Reichmann, MD  REFERRING PROVIDER: Barbette Reichmann, MD   REFERRING DIAG: Dizziness and  giddiness  RATIONALE FOR EVALUATION AND TREATMENT: Rehabilitation  THERAPY DIAG: Dizziness and giddiness  Unsteadiness on feet  ONSET DATE: "a couple months ago"  FOLLOW-UP APPT SCHEDULED WITH REFERRING PROVIDER: Yes, sees PCP every 4 months  SUBJECTIVE:  Pt denies any updates since last PT session. She is eager to regain recently lost function.   Chief Complaint:  Dizziness, imbalance, weakness: acute on chronic since acute GI illness   Pertinent History Claire Washington is a 89yoF who is referred to OPPT due to acute on chronic vertigo problem- recent GI illness with exacerbation of weakness, imbalance, and chronic dizziness problem. Remote history of several vestibular PT episodes over the years, recently followed by ENT and audiology. Pt was working with HHPT at [facility] who then recommended she see a vestibular specialist for her persistent dizziness issues. Pt saw Dr. Dortha Kern at [facility] for 2 sessions for vestibular workup, at that time felt that pt's chronic vertigo was as baseline and that it warranted no specific vestibular intervention, however evaluation did reveal significant balance impairment that warranted skilled PT services to reduce falls risk and help restore pt to most recent baseline. Pt elected to transfer her PT services to [facility] to avoid any further unnecessary transportation burden. Pt reports persistent dizziness symptoms c subsequent activity restriction for fear of triggering her symptoms. Pt reports baseline visual deificts from cataracts, glaucoma, macular D.   PMH: right cerebellar infarct, generalized cerebral and cerebellar atrophy, CAS, BPPV.    Brain MRI 02/22/23: IMPRESSION: 1. No evidence of an acute intracranial abnormality. 2. Moderate chronic small vessel ischemic changes within the cerebral white matter, similar to the prior brain MRI of 07/02/2020. 3. Tiny chronic infarct within the inferior right cerebellar hemisphere,  unchanged. 4. Generalized cerebral and cerebellar atrophy.  Has patient fallen in last 6 months? No Pertinent pain: Yes, mild neck pain Dominant hand: right Imaging: Yes   Prior level of function: Independent  Red Flags: Negative for sudden bowel/bladder changes, chills/fever, night sweats, unexplained weight loss/gain  PRECAUTIONS: None  WEIGHT BEARING RESTRICTIONS No  LIVING ENVIRONMENT: Lives with: lives alone Lives in: [place], independent living at [facility] Stairs: no Has following equipment at home: Dan Humphreys - 4 wheeled, power wheelchair, walk-in shower with grab bars and seat  PATIENT GOALS Decrease dizziness   OBJECTIVE  EXAMINATION  POSTURE: No gross deficits contributing to symptoms  MANUAL MUSCLE TESTING BUE/BLE strength WNL without focal deficits.  She reports some left lower extremity weakness which is chronic.  TRANSFERS/GAIT Independent for transfers and ambulation with rollator  PATIENT SURVEYS FOTO: 39, predicted improvement to 54 ABC: prn   TODAY'S TREATMENT 03/27/23 -AMB overground 316ft c 4WW, pace ad lib  0.65m/s  -SLS at support bar 2x30sec bilat -Rt SLS with alternating single arm flexion (2 to 1 support) x30 -Lt SLS with alternating single arm flexion (2 to 1 support)  x30 -Rt SLS with alternating finger to nose 1x30  -Lt SLS with alternating finger to nose 1x30  (not successful, better to stick with prior activity)  -Rt tandem stance with alternating finger to nose 1x30  -Left tandem stance with alternating finger to nose 1x30  -Double heel raise 2x15  *several seated recovery intervals taken due to intolerance to stance >3 minutes   150/625mmHg 77 seated  131/65mmHg  87bpm standing 0 133/57 mmHg  87bpm stanbding 1    PATIENT EDUCATION:  Education details: Plan of care Person educated: Patient Education method: Explanation Education comprehension: verbalized understanding   HOME EXERCISE PROGRAM:  None  currently   ASSESSMENT: CLINICAL IMPRESSION:  Started balance interventions today. Discussed at length prior HEP from HHPT, performed together, discussed/education on safe methods for independent progression, pt commits to starting consistent performance again. No new HEPissued, pt denies need for new handout. Pt verbalizes need for breaks when needed. Pt has persistent dizziness throughout and intermittent weakness of legs, both of which are mildly limiting to session. Pt will benefit from PT services to address deficits in balance, strength, and mobility in order to return to full function at home and decrease her risk for falls.  OBJECTIVE IMPAIRMENTS: Abnormal gait, decreased balance, difficulty walking, decreased strength, and dizziness.   ACTIVITY LIMITATIONS: bending, standing, transfers, and bed mobility  PARTICIPATION LIMITATIONS: meal prep, cleaning, shopping, and community activity  PERSONAL FACTORS: Age, Time since onset of injury/illness/exacerbation, and 3+ comorbidities: cerebellar CVA, OA, spinal stenosis, and diverticulitis  are also affecting patient's functional outcome.   REHAB POTENTIAL: Fair    CLINICAL DECISION MAKING: Unstable/unpredictable  EVALUATION COMPLEXITY: High   GOALS:  SHORT TERM GOALS: Target date: 04/10/2023  Pt will be independent with HEP for dizziness in order to decrease symptoms, improve balance,decrease fall risk, and improve function at home. Baseline: Goal status: Canceled    LONG TERM GOALS: Target date: 05/08/2023  Pt will increase FOTO to at least 57 to demonstrate significant improvement in function at home related to dizziness.  Baseline: 39 Goal status: INITIAL  2.  Pt will improve ABC by at least 13% in order to demonstrate clinically significant improvement in balance confidence.      Baseline: To be completed Goal status: INITIAL  3. Pt will decrease 5TSTS by at least 3 seconds in order to demonstrate clinically significant  improvement in LE strength      Baseline: 19.0s Goal status: INITIAL  4. Pt will improve BERG by at least 3 points in order to demonstrate clinically significant improvement in balance.       Baseline: 14/56 Goal status: INITIAL  5. Pt will decrease TUG to below 14 seconds/decrease in order to demonstrate decreased fall risk.  Baseline: 17.6s Goal status: INITIAL    PLAN: PT FREQUENCY: 1-2x/week  PT DURATION: 8 weeks  PLANNED INTERVENTIONS: Therapeutic exercises, Therapeutic activity, Neuromuscular re-education, Balance training, Gait training, Patient/Family education, Self Care, Joint mobilization, Joint manipulation, Vestibular training, Canalith repositioning, Orthotic/Fit training, DME instructions, Dry Needling, Electrical stimulation, Spinal manipulation, Spinal mobilization, Cryotherapy, Moist heat, Taping, Traction, Ultrasound, Ionotophoresis 4mg /ml Dexamethasone, Manual therapy, and Re-evaluation.  PLAN FOR NEXT SESSION:  Balance interventions; attempt 3 plane walking in // bars   4:09 PM, 03/27/23 Rosamaria Lints, PT, DPT Physical Therapist - [facility]  [phone])    [place] C, PT 03/27/2023, 3:17 PM

## 2023-04-01 ENCOUNTER — Other Ambulatory Visit: Payer: Self-pay | Admitting: Cardiovascular Disease

## 2023-04-03 ENCOUNTER — Ambulatory Visit: Payer: Medicare Other | Admitting: Physical Therapy

## 2023-04-03 DIAGNOSIS — R2681 Unsteadiness on feet: Secondary | ICD-10-CM

## 2023-04-03 DIAGNOSIS — R42 Dizziness and giddiness: Secondary | ICD-10-CM

## 2023-04-03 NOTE — Therapy (Unsigned)
OUTPATIENT PHYSICAL THERAPY TREATMENT  Patient Name: Claire Washington: [id number] DOB:07/20/1924, 89 y.o., female Today's Date: 04/03/2023  END OF SESSION:  PT End of Session - 04/03/23 1021     Visit Number 4    Number of Visits 17    Date for PT Re-Evaluation 05/08/23    Authorization Type Medicare A & B    PT Start Time 1021    PT Stop Time 1100    PT Time Calculation (min) 39 min    Activity Tolerance Patient tolerated treatment well;No increased pain    Behavior During Therapy WFL for tasks assessed/performed            Past Medical History:  Diagnosis Date   Arthritis    Basal cell carcinoma    basel cell   Chronic cystitis    Diabetes mellitus type 2, controlled, without complications (HCC)    type 2 or unspecified type diabetes mellitus without mention of complication, not stated as uncontrolled   Disease of thyroid gland    Diverticulitis    Embolic stroke (HCC)    a. 07/2016 MRI Brain: small right superior cerebellum and left lateral occipital cortex infarcts;  b. 08/2016 Echo: EF 60-65%, Gr1 DD, mild MR;  c. 08/2016 30 Day Event Monitor: No significant tachy/bradyarrhythmias. No afib.   Follicular neoplasm of thyroid    Hemorrhage of rectum and anus    Hyperlipidemia    Hyperlipidemia, unspecified    Hypertension    Leg pain    Osteoarthritis    Pernicious anemia    Small vessel disease (HCC)    Spinal stenosis, lumbar region without neurogenic claudication    Subdural hematoma (HCC)    a. 2013 in setting of syncope.   Syncope and collapse    a. 2013 - syncope and fall w/ resultant subdural hematoma; b. prev nl echo and stress test @ [facility].   Thyroid nodule    Vertigo & Chronic Dizziness    a. Previously seen by ENT with Physical Therapy.  MRI 2000 small vessel disease   Past Surgical History:  Procedure Laterality Date   BILATERAL SALPINGOOPHORECTOMY     BREAST CYST EXCISION     ? side, yrs ago   COLONOSCOPY  01/26/2009    Diverticulosis   CYSTOSCOPY     HEMORRHOIDECTOMY BY SIMPLE LIGATION     PR COLONOSCOPY FLX W/ENDOSCOPIC MUCOSAL RESECTION  09/02/2014   Procedure: COLONOSCOPY, FLEXIBLE; WITH ENDOSCOPIC MUCOSAL RESECTION; Surgeon: Mayford Knife, MD; [facility]: [facility]; Service: Gastroenterology   PR COLSC FLX W/RMVL OF TUMOR POLYP LESION SNARE TQ  09/02/2014   Procedure: COLONOSCOPY FLEX; W/REMOV TUMOR/LES BY SNARE; Surgeon: Mayford Knife, MD; [facility]: [facility]; Service: Gastroenterology   SKIN SURGERY     TOTAL ABDOMINAL HYSTERECTOMY W/ BILATERAL SALPINGOOPHORECTOMY  1970s   Patient Active Problem List   Diagnosis Date Noted   Diarrhea 12/30/2022   Degeneration of nervous system (HCC) 12/28/2022   PAD (peripheral artery disease) (HCC) 12/26/2022   Protein-calorie malnutrition, severe 12/26/2022   Sepsis due to undetermined organism (HCC) 12/12/2022   Type 2 diabetes mellitus without complications (HCC) 12/12/2022   Acute diverticulitis 12/11/2022   Acute respiratory failure with hypoxia (HCC) 01/18/2022   Nausea vomiting and diarrhea 01/14/2022   Sigmoid diverticulitis 01/13/2022   Hyponatremia 08/31/2020   Lymphedema 03/09/2019   Swelling of limb 02/28/2019   Cellulitis of left leg 12/31/2018   Hip pain, bilateral 12/31/2018   Cellulitis 12/27/2018  Carotid stenosis 12/28/2017   Vertigo 11/30/2017   Imbalance due to old stroke 11/03/2016   Gait instability 10/19/2016   Ataxia 08/30/2016   History of stroke 08/30/2016   Dizziness 11/03/2015   Abnormal PET scan of colon 06/18/2014   H/O seasonal allergies 06/04/2014   Lung nodule, solitary 06/04/2014   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   Essential hypertension 12/04/2011   Hyperlipidemia 12/04/2011   Syncope 12/04/2011   Diabetes mellitus (HCC) 12/04/2011   Smoking hx 12/04/2011   PCP: Claire Reichmann, MD  REFERRING PROVIDER: Barbette Reichmann, MD   REFERRING DIAG: Dizziness and  giddiness  RATIONALE FOR EVALUATION AND TREATMENT: Rehabilitation  THERAPY DIAG: Dizziness and giddiness  Unsteadiness on feet  ONSET DATE: "a couple months ago"  FOLLOW-UP APPT SCHEDULED WITH REFERRING PROVIDER: Yes, sees PCP every 4 months  SUBJECTIVE:  Pt reports that she is extremely dizzy today rates 7/10. Reports that BP was elevated this morning with systolic reading >413. Also states that she does not want to continue PT due to the financial strain of managing transport to PT clinic.  After discussion. Pt states that she may be able to continue therapy if appointments are in the afternoon.    Chief Complaint:  Dizziness, imbalance, weakness: acute on chronic since acute GI illness   Pertinent History Claire Washington is a 89yoF who is referred to OPPT due to acute on chronic vertigo problem- recent GI illness with exacerbation of weakness, imbalance, and chronic dizziness problem. Remote history of several vestibular PT episodes over the years, recently followed by ENT and audiology. Pt was working with HHPT at [facility] who then recommended she see a vestibular specialist for her persistent dizziness issues. Pt saw Dr. Dortha Washington at [facility] for 2 sessions for vestibular workup, at that time felt that pt's chronic vertigo was as baseline and that it warranted no specific vestibular intervention, however evaluation did reveal significant balance impairment that warranted skilled PT services to reduce falls risk and help restore pt to most recent baseline. Pt elected to transfer her PT services to [facility] to avoid any further unnecessary transportation burden. Pt reports persistent dizziness symptoms c subsequent activity restriction for fear of triggering her symptoms. Pt reports baseline visual deificts from cataracts, glaucoma, macular D.   PMH: right cerebellar infarct, generalized cerebral and cerebellar atrophy, CAS, BPPV.    Brain MRI 02/22/23: IMPRESSION: 1. No  evidence of an acute intracranial abnormality. 2. Moderate chronic small vessel ischemic changes within the cerebral white matter, similar to the prior brain MRI of 07/02/2020. 3. Tiny chronic infarct within the inferior right cerebellar hemisphere, unchanged. 4. Generalized cerebral and cerebellar atrophy.  Has patient fallen in last 6 months? No Pertinent pain: Yes, mild neck pain Dominant hand: right Imaging: Yes   Prior level of function: Independent  Red Flags: Negative for sudden bowel/bladder changes, chills/fever, night sweats, unexplained weight loss/gain  PRECAUTIONS: None  WEIGHT BEARING RESTRICTIONS No  LIVING ENVIRONMENT: Lives with: lives alone Lives in: [place], independent living at [facility] Stairs: no Has following equipment at home: Dan Humphreys - 4 wheeled, power wheelchair, walk-in shower with grab bars and seat  PATIENT GOALS Decrease dizziness   OBJECTIVE  EXAMINATION  POSTURE: No gross deficits contributing to symptoms  MANUAL MUSCLE TESTING BUE/BLE strength WNL without focal deficits.  She reports some left lower extremity weakness which is chronic.  TRANSFERS/GAIT Independent for transfers and ambulation with rollator  PATIENT SURVEYS FOTO: 39, predicted improvement to 54 ABC:  prn   TODAY'S TREATMENT 04/03/23  Pt reports that she is doing well, but very dizzy today. 7/10   150/50. HR 89 Sitting  126/54. HR 87 Standing    153/51. HR 87 Standing 2 min   Standing balance HEP:  - Semi-Tandem Corner Balance With Eyes Open  2 reps - 15 hold - Standing Balance in Corner  2 reps - 30 hold - Standing Balance in Corner with Eyes Closed  2 reps - 10 hold - Seated Calf Stretch with Strap  3 reps - 30 hold  Min assist for all balance therex; severe difficulty with eyes closed. Cues for use of light UE support when performing at home. Education of importance of   Pt reports that she saw ENT last week and responded well to BPPV  treatment.  Assessment: Pt exhibits non-jerk, downbeat nystagmus <10 sec duration with R and L Dix-Hallpike with reports of dizziness/lightheadedness with bilateral testing, unclear if pt experiencing vertigo. Pt's symptoms resolve within 1-2 minutes. Pt's nystagmus and sx not typical of peripheral deficit. Pt would benefit from further assessment future visit.   PATIENT EDUCATION:  Education details: Plan of care. Benefits of conitnued PT for central system vertigo.  Person educated: Patient Education method: Explanation Education comprehension: verbalized understanding   HOME EXERCISE PROGRAM:  Access Code: [email or web address] URL: [email or web address] Date: 04/03/2023 Prepared by: Grier Rocher  Exercises - Semi-Tandem Corner Balance With Eyes Open  - 1 x daily - 7 x weekly - 3 sets - 10 reps - 10 hold - Standing Balance in Corner  - 1 x daily - 7 x weekly - 3 sets - 10 reps - 30 hold - Standing Balance in Corner with Eyes Closed  - 1 x daily - 7 x weekly - 3 sets - 10 reps - 10 hold - Seated Calf Stretch with Strap  - 1 x daily - 7 x weekly - 3 sets - 3 reps - 30 hold  ASSESSMENT: CLINICAL IMPRESSION:  Pt more limited by dizziness on this day. States that she may not continue PT, but then agreeable to attempt afternoon PT treatments. Expanded balance HEP in corner with RW/4WW for safety. BPPV assessment, but no typical s/s present. Pt will benefit from PT services to address deficits in balance, strength, and mobility in order to return to full function at home and decrease her risk for falls.  OBJECTIVE IMPAIRMENTS: Abnormal gait, decreased balance, difficulty walking, decreased strength, and dizziness.   ACTIVITY LIMITATIONS: bending, standing, transfers, and bed mobility  PARTICIPATION LIMITATIONS: meal prep, cleaning, shopping, and community activity  PERSONAL FACTORS: Age, Time since onset of injury/illness/exacerbation, and 3+ comorbidities: cerebellar CVA, OA, spinal  stenosis, and diverticulitis  are also affecting patient's functional outcome.   REHAB POTENTIAL: Fair    CLINICAL DECISION MAKING: Unstable/unpredictable  EVALUATION COMPLEXITY: High   GOALS:  SHORT TERM GOALS: Target date: 04/10/2023  Pt will be independent with HEP for dizziness in order to decrease symptoms, improve balance,decrease fall risk, and improve function at home. Baseline: Goal status: Canceled    LONG TERM GOALS: Target date: 05/08/2023  Pt will increase FOTO to at least 57 to demonstrate significant improvement in function at home related to dizziness.  Baseline: 39 Goal status: INITIAL  2.  Pt will improve ABC by at least 13% in order to demonstrate clinically significant improvement in balance confidence.      Baseline: To be completed Goal status: INITIAL  3. Pt will decrease 5TSTS by at least 3  seconds in order to demonstrate clinically significant improvement in LE strength      Baseline: 19.0s Goal status: INITIAL  4. Pt will improve BERG by at least 3 points in order to demonstrate clinically significant improvement in balance.       Baseline: 14/56 Goal status: INITIAL  5. Pt will decrease TUG to below 14 seconds/decrease in order to demonstrate decreased fall risk.  Baseline: 17.6s Goal status: INITIAL    PLAN: PT FREQUENCY: 1-2x/week  PT DURATION: 8 weeks  PLANNED INTERVENTIONS: Therapeutic exercises, Therapeutic activity, Neuromuscular re-education, Balance training, Gait training, Patient/Family education, Self Care, Joint mobilization, Joint manipulation, Vestibular training, Canalith repositioning, Orthotic/Fit training, DME instructions, Dry Needling, Electrical stimulation, Spinal manipulation, Spinal mobilization, Cryotherapy, Moist heat, Taping, Traction, Ultrasound, Ionotophoresis 4mg /ml Dexamethasone, Manual therapy, and Re-evaluation.  PLAN FOR NEXT SESSION:   Balance interventions; attempt 3 plane walking in // bars  Grier Rocher PT, DPT  Physical Therapist - [place]  [facility]  7:56 AM 04/04/23

## 2023-04-13 ENCOUNTER — Telehealth: Payer: Self-pay | Admitting: Cardiovascular Disease

## 2023-04-13 ENCOUNTER — Other Ambulatory Visit: Payer: Self-pay

## 2023-04-13 MED ORDER — AMLODIPINE BESYLATE 2.5 MG PO TABS: 7.5000 mg | ORAL_TABLET | Freq: Every evening | ORAL | 0 refills | Status: DC

## 2023-04-13 NOTE — Telephone Encounter (Signed)
*  STAT* If patient is at the pharmacy, call can be transferred to refill team.   1. Which medications need to be refilled? (please list name of each medication and dose if known) new prescription for Amlodipine   2. Would you like to learn more about the convenience, safety, & potential cost savings by using the [facility]?     3. Are you open to using the [facility] (Type [facility].   4. Which pharmacy/location (including street and city if local pharmacy) is medication to be sent to?[facility] [address] [place]   5. Do they need a 30 day or 90 day supply? 90 days and refills- please call in today- out of medicine

## 2023-04-13 NOTE — Telephone Encounter (Signed)
Disp Refills Start End   amLODipine (NORVASC) 2.5 MG tablet 270 tablet 0 04/13/2023 07/12/2023   Sig - Route: Take 3 tablets (7.5 mg total) by mouth every evening. - Oral   Sent to pharmacy as: amLODipine (NORVASC) 2.5 MG tablet   Notes to Pharmacy: PATIENT IS CURRENTLY OUT OF THIS MEDICATION.   E-Prescribing Status: Sent to pharmacy (04/13/2023 11:22 AM EST)    Pharmacy  [id number] [place],  [phone]

## 2023-04-16 ENCOUNTER — Ambulatory Visit: Payer: BLUE CROSS/BLUE SHIELD | Admitting: Physical Therapy

## 2023-04-16 ENCOUNTER — Telehealth: Payer: Self-pay | Admitting: Cardiovascular Disease

## 2023-04-16 MED ORDER — AMLODIPINE BESYLATE 2.5 MG PO TABS: 2.5000 mg | ORAL_TABLET | Freq: Every day | ORAL | 3 refills | Status: DC

## 2023-04-16 MED ORDER — AMLODIPINE BESYLATE 5 MG PO TABS: 5.0000 mg | ORAL_TABLET | Freq: Every day | ORAL | 3 refills | Status: DC

## 2023-04-16 NOTE — Telephone Encounter (Signed)
Pt c/o medication issue:  1. Name of Medication: amLODipine (NORVASC) 2.5 MG tablet   2. How are you currently taking this medication (dosage and times per day)?   3. Are you having a reaction (difficulty breathing--STAT)? no  4. What is your medication issue? Pt called in frustrated because she is not getting a refill. Called her pharmacy and confirmed that she picked up 2.5mg  tablets on 03/21/23. She states she doesn't want 2.5 mg, she would like 5 mg tablets. I asked her to confirm how long she has been taking it, she states "I have been taking 5 mg tablets for years". Please advise.

## 2023-04-16 NOTE — Telephone Encounter (Signed)
Spoke with patient and she reviewed how she is taking her amlodipine. She takes Amlodipine 5 mg pill at bedtime and also takes Amlodipine 2.5 mg at bedtime for a total of 7.5 mg. We discussed the medication instructions in detail and she wants to take it like she has been doing. So she wants 2 different prescriptions for the amlodipine. Will update chart and send in medication. No further needs and advised patient to call back if any further questions.

## 2023-04-17 NOTE — Progress Notes (Deleted)
 Cardiology Clinic Note   Date: 04/17/2023 ID: Marissia, Blackham [birth year], MRN [id number]  Primary Cardiologist:  None  Patient Profile    Claire Washington is a 89 y.o. female who presents to the clinic today for ***    Past medical history significant for: Carotid artery stenosis. Carotid duplex 12/28/2020: Bilateral ICA 1 to 39%. Hypertension. Hyperlipidemia. T2DM. Near-syncope/syncope/dizziness. Echo 11/18/2020: EF 70 to 75%.  No RWMA.  Grade II DD.  Normal RV size/function.  Trivial MR. 14-day ZIO 07/06/2021: HR 52 to 162 bpm, average 76 bpm.  9 runs of SVT/A. tach fastest/longest 12 beats max rate 162 bpm, average 142 bpm.  PVCs 1.1%.  Rare atrial ectopy. Unsteady gait. Subdural hematoma 2013. CVA 2018. Thyroid  cancer.  In summary, patient with a history of syncope in 2013 with resultant subdural hematoma.  Stress testing and echo performed at [facility] clinic were normal.  She was evaluated by ENT in the past for dizziness and underwent physical therapy with improvement to dizziness.  Event monitor in 2018 showed NSR, rare PVCs and no significant arrhythmia.  Symptoms of dizziness did not correlate with underlying arrhythmia.  Echo 2018 showed normal LV function with no significant valvular abnormalities and no etiology for dizziness.  She was evaluated by neurology for her history of stroke.  Patient with a history of vertigo but refused meclizine .  Upon follow-up in February 2023 she continued to have dizziness and episodes of near syncope.  14-day ZIO showed runs of SVT/A. tach and 1.1% PVCs as detailed above.     History of Present Illness    Claire Washington is followed by Dr. Gollan for the above outlined history.  Patient was last seen in the office by Dr. Gollan on 05/29/2022 for routine follow-up.  She was doing well at that time and no medication changes were made.  Today, patient ***  Dizziness/near syncope/syncope Echo August 2022 showed normal LV/RV  function, no RWMA, Grade II DD.  14-day ZIO March 2023 showed 9 runs of SVT/A. tach and PVCs 1.1%.  Patient***  Carotid artery stenosis Carotid duplex September 2022 showed bilateral ICA 1 to 39%.  Patient*** -Continue Plavix .  Hypertension BP today*** -Continue amlodipine  at current dose, hydralazine  as needed.  ROS: All other systems reviewed and are otherwise negative except as noted in History of Present Illness.  EKGs/Labs Reviewed        12/25/2022: ALT 14; AST 14 12/31/2022: BUN 16; Creatinine, Ser 0.54; Potassium 4.0; Sodium 139   12/31/2022: Hemoglobin 14.5; WBC 5.2   No results found for requested labs within last 365 days.   No results found for requested labs within last 365 days.  ***  Risk Assessment/Calculations    {Does this patient have ATRIAL FIBRILLATION?[phone]} No BP recorded.  {Refresh Note OR Click here to enter BP  :1}***        Physical Exam    VS:  There were no vitals taken for this visit. , BMI There is no height or weight on file to calculate BMI.  GEN: Well nourished, well developed, in no acute distress. Neck: No JVD or carotid bruits. Cardiac: *** RRR. No murmurs. No rubs or gallops.   Respiratory:  Respirations regular and unlabored. Clear to auscultation without rales, wheezing or rhonchi. GI: Soft, nontender, nondistended. Extremities: Radials/DP/PT 2+ and equal bilaterally. No clubbing or cyanosis. No edema ***  Skin: Warm and dry, no rash. Neuro: Strength intact.  Assessment & Plan   ***  Disposition: ***     {  Are you ordering a CV Procedure (e.g. stress test, cath, DCCV, TEE, etc)?   Press F2        :[id number]}   Signed, Barnie HERO. Violanda Bobeck, DNP, NP-C

## 2023-04-19 ENCOUNTER — Ambulatory Visit: Payer: Medicare Other

## 2023-04-19 ENCOUNTER — Ambulatory Visit: Payer: BLUE CROSS/BLUE SHIELD | Attending: Student | Admitting: Student

## 2023-04-23 ENCOUNTER — Ambulatory Visit: Payer: Medicare Other | Admitting: Physical Therapy

## 2023-04-30 ENCOUNTER — Ambulatory Visit: Payer: Medicare Other | Admitting: Physical Therapy

## 2023-05-02 ENCOUNTER — Ambulatory Visit: Payer: BLUE CROSS/BLUE SHIELD | Admitting: Physical Therapy

## 2023-05-07 ENCOUNTER — Ambulatory Visit: Payer: BLUE CROSS/BLUE SHIELD | Admitting: Physical Therapy

## 2023-05-09 ENCOUNTER — Ambulatory Visit: Payer: BLUE CROSS/BLUE SHIELD | Admitting: Physical Therapy

## 2023-05-13 ENCOUNTER — Other Ambulatory Visit: Payer: Self-pay | Admitting: Cardiovascular Disease

## 2023-05-14 ENCOUNTER — Ambulatory Visit: Payer: BLUE CROSS/BLUE SHIELD | Admitting: Physical Therapy

## 2023-05-14 NOTE — Telephone Encounter (Signed)
Last office visit: 05/29/22 with plan to f/u 12 months. next office visit: 06/05/23

## 2023-05-16 ENCOUNTER — Ambulatory Visit: Payer: BLUE CROSS/BLUE SHIELD | Admitting: Physical Therapy

## 2023-06-04 NOTE — Progress Notes (Deleted)
 Cardiology Office Note  Date:  06/04/2023   ID:  Claire Washington, DOB 1924/08/20, MRN [id number]  PCP:  Barbette Reichmann, MD   No chief complaint on file.   HPI:  [name]  is a 89ear-old woman with history of   hypertension, benign positional vertigo,  osteoarthritis,  hyperlipidemia,  chronic cystitis,  diabetes,  Long smoking history until age 89 Previous TIAs per the patient, CVA 2018 Carotid stenosis <39% b/l in 2019 Chronic  dizziness in her head, gait instability, for years, progressive who presents for routine followup of her dizziness, hypertension, TIA/CVAs  Last seen by myself in clinic 2/24  Lives at the [facility]  October 2023 in the hospital 5 days in hospital with nausea vomiting diarrhea,  Sigmoid diverticulitis, was very weak Discharged home with PT  Has power chair, walker, feels her weakness is slowly improving  Labs reviewed: HGB 15 CR 0.6 Total chol 273, LDL 171  Off lipitor, total chol 270, Was 170 on lipitior  Renal function normal in 2023 on multiple lab checks Taking amlodipine 7.5 mg in the evening Reports that she is off her metoprolol and not on Lipitor Family prefers her not to be on a statin Jump in cholesterol from 170 up to 270 without the medication Details discussed with her  EKG personally reviewed by myself on todays visit Normal sinus rhythm rate 79 bpm nonspecific ST abnormality  past medical history reviewed ER September 27, 2020 Weakness, given fluids felt better  ER September 29, 2020 for weakness Given fluids, felt better  Seen in the emergency room March 07, 2021 dehydration Blood pressure 158 systolic up to 160s  Seen in the emergency room April 14, 2021 near syncope 1 year of watery diarrhea 1 month reported near syncope Near syncope when going to pick up groceries at [employer], EMS called, given IV fluids felt better Work-up negative in the ER  For most of her visits above lab work reviewed, normal  renal function Only prerenal lab appeared to be July 02, 2020  In the er 09/2020 generalized weakness and dizziness with standing that is been going on for about 4 months, worse in the last few weeks.    Diarrhea better to some degree Last 1.5 wks ago  Tired sleepy, fatigue Stopped losartan , feels dizziness better  Orthostatics negative: today Pressures 120s to 150s heart rate stays in the 70s reports dizziness throughout any position changes  EKG personally reviewed by myself on todays visit Shows normal sinus rhythm rate 67 bpm no significant ST-T wave changes  Past medical history reviewed Holter monitor reviewed from primary care, this shows predominant normal sinus rhythm, rare episodes of narrow complex tachycardia longest 6 beats, 3 beat run wide-complex  Echo 11/2020 reviewed in detail  1. Left ventricular ejection fraction, by estimation, is 70 to 75%. The  left ventricle has hyperdynamic function. The left ventricle has no  regional wall motion abnormalities. Left ventricular diastolic parameters  are consistent with Grade II diastolic  dysfunction (pseudonormalization).   2. Right ventricular systolic function is normal. The right ventricular  size is normal.   3. The mitral valve is degenerative. Trivial mitral valve regurgitation.   4. The aortic valve was not well visualized. Aortic valve regurgitation  is not visualized.   5. The inferior vena cava is normal in size with greater than 50%  respiratory variability, suggesting right atrial pressure of 3 mmHg.   Previously seen by neurology for chronic dizziness MRI brain without  contrast done on 11/30/2017 which showed moderate atrophy and moderate microvascular ischemic changes. --old cerebellar stroke, microvascular ischemic changes, and inner ear issues: worsened.  Echo 08/2016 Normal LV function No significant valve disease No indication from the echo as to cause of dizziness  Event monitor  08/2016 Normal  sinus rhythm Rare PVCs, <1% No other significant arrhythmia noted Symptoms of dizziness did not seem to correlate to underlying arrhythmia  Chronic equilibrium and dizziness dating back several years, worse recently Difficult time with equilibrium.  Working with PT   PMH:   has a past medical history of Arthritis, Basal cell carcinoma, Chronic cystitis, Diabetes mellitus type 2, controlled, without complications (HCC), Disease of thyroid gland, Diverticulitis, Embolic stroke (HCC), Follicular neoplasm of thyroid, Hemorrhage of rectum and anus, Hyperlipidemia, Hyperlipidemia, unspecified, Hypertension, Leg pain, Osteoarthritis, Pernicious anemia, Small vessel disease (HCC), Spinal stenosis, lumbar region without neurogenic claudication, Subdural hematoma (HCC), Syncope and collapse, Thyroid nodule, and Vertigo & Chronic Dizziness.  PSH:    Past Surgical History:  Procedure Laterality Date   BILATERAL SALPINGOOPHORECTOMY     BREAST CYST EXCISION     ? side, yrs ago   COLONOSCOPY  01/26/2009   Diverticulosis   CYSTOSCOPY     HEMORRHOIDECTOMY BY SIMPLE LIGATION     PR COLONOSCOPY FLX W/ENDOSCOPIC MUCOSAL RESECTION  09/02/2014   Procedure: COLONOSCOPY, FLEXIBLE; WITH ENDOSCOPIC MUCOSAL RESECTION; Surgeon: Mayford Knife, MD; [facility]; Service: Gastroenterology   PR COLSC FLX W/RMVL OF TUMOR POLYP LESION SNARE TQ  09/02/2014   Procedure: COLONOSCOPY FLEX; W/REMOV TUMOR/LES BY SNARE; Surgeon: Mayford Knife, MD; [facility]; Service: Gastroenterology   SKIN SURGERY     TOTAL ABDOMINAL HYSTERECTOMY W/ BILATERAL SALPINGOOPHORECTOMY  1970s    Current Outpatient Medications  Medication Sig Dispense Refill   acetaminophen (TYLENOL) 325 MG tablet Take 2 tablets (650 mg total) by mouth every 6 (six) hours as needed for mild pain, moderate pain, fever or headache (or Fever >/= 101).     amLODipine (NORVASC) 2.5 MG tablet Take 1 tablet (2.5 mg  total) by mouth at bedtime. Take both 5 mg pill and 2.5 mg pill at bedtime. (TOTAL DOSE 7.5 MG) 90 tablet 3   amLODipine (NORVASC) 5 MG tablet Take 1 tablet (5 mg total) by mouth at bedtime. Take both 5 mg pill and 2.5 mg pill at bedtime. (TOTAL DOSE 7.5 MG) 90 tablet 3   clopidogrel (PLAVIX) 75 MG tablet TAKE 1 TABLET BY MOUTH EVERY DAY 30 tablet 0   feeding supplement (ENSURE ENLIVE / ENSURE PLUS) LIQD Take 237 mLs by mouth 2 (two) times daily between meals.     hydrALAZINE (APRESOLINE) 25 MG tablet TAKE 1 TABLET BY MOUTH 3 TIMES DAILY AS NEEDED FOR SYSTOLIC BLOOD PRESSURE >160 90 tablet 3   magnesium hydroxide (MILK OF MAGNESIA) 400 MG/5ML suspension Take 5 mLs by mouth daily as needed for mild constipation.     Multiple Vitamins-Minerals (CENTRUM SILVER PO) Take 1 tablet by mouth daily. Reported on 06/11/2015     polyethylene glycol (MIRALAX / GLYCOLAX) 17 g packet Take 17 g by mouth daily as needed. 30 each 0   vitamin B-12 (CYANOCOBALAMIN) 1000 MCG tablet Take 500 mcg by mouth daily.     Vitamin D, Ergocalciferol, (DRISDOL) 1.25 MG (50000 UT) CAPS capsule Take 50,000 Units by mouth every 7 (seven) days.     No current facility-administered medications for this visit.    Allergies:   Latex, Hydrocortisone,  Sulfa antibiotics, and Tape   Social History:  The patient  reports that she quit smoking about 29 years ago. Her smoking use included cigarettes. She started smoking about 49 years ago. She has a 5 pack-year smoking history. She has never used smokeless tobacco. She reports current alcohol use of about 2.0 standard drinks of alcohol per week. She reports that she does not use drugs.   Family History:   family history includes Asthma in her daughter; Colon cancer in her mother; Diabetes in an other family member; Heart attack in her brother and father; Heart disease in her father; Prostate cancer in her father; Stroke in her father.   Review of Systems  Constitutional: Negative.   HENT:  Negative.    Respiratory: Negative.    Cardiovascular: Negative.   Gastrointestinal: Negative.   Musculoskeletal: Negative.        Leg weakness  Neurological: Negative.   Psychiatric/Behavioral: Negative.    All other systems reviewed and are negative.   PHYSICAL EXAM: VS:  There were no vitals taken for this visit. , BMI There is no height or weight on file to calculate BMI. Constitutional:  oriented to person, place, and time. No distress.  HENT:  Head: Grossly normal Eyes:  no discharge. No scleral icterus.  Neck: No JVD, no carotid bruits  Cardiovascular: Regular rate and rhythm, no murmurs appreciated Pulmonary/Chest: Clear to auscultation bilaterally, no wheezes or rails Abdominal: Soft.  no distension.  no tenderness.  Musculoskeletal: Normal range of motion Neurological:  normal muscle tone. Coordination normal. No atrophy Skin: Skin warm and dry Psychiatric: normal affect, pleasant  Recent Labs: 12/25/2022: ALT 14 12/31/2022: BUN 16; Creatinine, Ser 0.54; Hemoglobin 14.5; Magnesium 2.1; Platelets 251; Potassium 4.0; Sodium 139    Lipid Panel No results found for: "CHOL", "HDL", "LDLCALC", "TRIG"    Wt Readings from Last 3 Encounters:  12/31/22 100 lb 15.5 oz (45.8 kg)  12/14/22 118 lb 2.7 oz (53.6 kg)  05/29/22 119 lb (54 kg)     ASSESSMENT AND PLAN:  Essential hypertension Reports she no longer takes metoprolol succinate She is taking amlodipine 7.5 mg in the evening, does not check blood pressure at home  Dizziness Chronic issue, does not appear to be a cardiac issue MRI showing  Strokes, microvascular disease  followed by neurology No further workup at this time  Chronic weakness Working with PT, uses a walker, has motorized scooter [facility]  Stroke Multiple embolic strokes on MRI carotid disease on the right followed by Dr. Wyn Quaker Most recent ultrasound reviewed, nonobstructive On Plavix,  Chosen to stop her statin,  encouraged by family  to stop it  Near syncope/syncope Numerous episodes through 2022 Seen in the emergency room, diagnosed with " dehydration" Drinking lots of Pedialyte, coconut water Zio monitor with no significant arrhythmia Blood sugar stable, no indication of hypotension  Smoking hx Currently a nonsmoker  Type 2 diabetes mellitus with other circulatory complication, without long-term current use of insulin (HCC) Managed by primary care  Mixed hyperlipidemia Goal LDL less than 70,  Prefers not to be on Lipitor, cholesterol 170 now running to 70 without medication, details discussed with her   Total encounter time more than 30 minutes  Greater than 50% was spent in counseling and coordination of care with the patient   No orders of the defined types were placed in this encounter.    Signed, Dossie Arbour, M.D., Ph.D. 06/04/2023  [facility] [place], [place] [phone]

## 2023-06-05 ENCOUNTER — Ambulatory Visit: Payer: BLUE CROSS/BLUE SHIELD | Admitting: Cardiovascular Disease

## 2023-06-05 DIAGNOSIS — R55 Syncope and collapse: Secondary | ICD-10-CM

## 2023-06-05 DIAGNOSIS — R0600 Dyspnea, unspecified: Secondary | ICD-10-CM

## 2023-06-05 DIAGNOSIS — E1159 Type 2 diabetes mellitus with other circulatory complications: Secondary | ICD-10-CM

## 2023-06-05 DIAGNOSIS — Z8673 Personal history of transient ischemic attack (TIA), and cerebral infarction without residual deficits: Secondary | ICD-10-CM

## 2023-06-05 DIAGNOSIS — E785 Hyperlipidemia, unspecified: Secondary | ICD-10-CM

## 2023-06-05 DIAGNOSIS — I6523 Occlusion and stenosis of bilateral carotid arteries: Secondary | ICD-10-CM

## 2023-06-05 DIAGNOSIS — I340 Nonrheumatic mitral (valve) insufficiency: Secondary | ICD-10-CM

## 2023-06-05 DIAGNOSIS — I10 Essential (primary) hypertension: Secondary | ICD-10-CM

## 2023-06-05 DIAGNOSIS — R42 Dizziness and giddiness: Secondary | ICD-10-CM

## 2023-06-05 DIAGNOSIS — I739 Peripheral vascular disease, unspecified: Secondary | ICD-10-CM

## 2023-06-05 DIAGNOSIS — R0989 Other specified symptoms and signs involving the circulatory and respiratory systems: Secondary | ICD-10-CM

## 2023-06-11 ENCOUNTER — Encounter: Payer: Self-pay | Admitting: Ophthalmology

## 2023-06-11 NOTE — Anesthesia Preprocedure Evaluation (Addendum)
 Anesthesia Evaluation    Airway Mallampati: IV  TM Distance: <3 FB Neck ROM: Full    Dental no notable dental hx. (+) Upper Dentures, Lower Dentures   Pulmonary former smoker   Pulmonary exam normal breath sounds clear to auscultation       Cardiovascular hypertension, + Peripheral Vascular Disease  Normal cardiovascular exam Rhythm:Regular Rate:Normal  11-18-20  1. Left ventricular ejection fraction, by estimation, is 70 to 75%. The  left ventricle has hyperdynamic function. The left ventricle has no  regional wall motion abnormalities. Left ventricular diastolic parameters  are consistent with Grade II diastolic  dysfunction (pseudonormalization).   2. Right ventricular systolic function is normal. The right ventricular  size is normal.   3. The mitral valve is degenerative. Trivial mitral valve regurgitation.   4. The aortic valve was not well visualized. Aortic valve regurgitation  is not visualized.   5. The inferior vena cava is normal in size with greater than 50%  respiratory variability, suggesting right atrial pressure of 3 mmHg.   07-06-21  Event monitor Patch Wear Time:  13 days and 16 hours (2023-02-26T17:27:27-0500 to 2023-03-12T11:12:12-398)   Normal sinus rhythm Patient had a min HR of 53 bpm, max HR of 162 bpm, and avg HR of 76 bpm.    9 Supraventricular Tachycardia/atrial tachycardia runs occurred, the run with the fastest interval lasting 12 beats with a max rate of 162 bpm (avg 142 bpm); the run with the fastest interval was also the longest.  No patient trigger.   Isolated SVEs were rare (<1.0%), SVE Couplets were rare (<1.0%), and SVE Triplets were rare (<1.0%). Isolated VEs were occasional (1.1%, 16651), VE Couplets were rare (<1.0%, 58), and VE Triplets were rare (<1.0%, 1).  Ventricular Bigeminy and Trigeminy were present. Diary triggered events associated with rare PVC   Signed, Dossie Arbour, MD,  Ph.D    Neuro/Psych CVA    GI/Hepatic   Endo/Other  diabetes    Renal/GU      Musculoskeletal  (+) Arthritis ,    Abdominal   Peds  Hematology  (+) Blood dyscrasia, anemia   Anesthesia Other Findings Hypertension  Osteoarthritis Hyperlipidemia  Small vessel disease (HCC) Leg pain  Syncope and collapse Vertigo & Chronic Dizziness  Thyroid nodule Subdural hematoma (HCC)  Embolic stroke (HCC) Pernicious anemia  Hemorrhage of rectum and anus Spinal stenosis, lumbar region without neurogenic claudication  Diabetes mellitus type 2, controlled, without complications (HCC) Hyperlipidemia, unspecified  Chronic cystitis Follicular neoplasm of thyroid  Arthritis Disease of thyroid gland  Basal cell carcinoma Diverticulitis  Wears hearing aid in both ears Ventricular bigeminy  Reproductive/Obstetrics                              Anesthesia Physical Anesthesia Plan  ASA: 3  Anesthesia Plan: MAC   Post-op Pain Management:    Induction: Intravenous  PONV Risk Score and Plan:   Airway Management Planned: Natural Airway and Nasal Cannula  Additional Equipment:   Intra-op Plan:   Post-operative Plan:   Informed Consent: I have reviewed the patients History and Physical, chart, labs and discussed the procedure including the risks, benefits and alternatives for the proposed anesthesia with the patient or authorized representative who has indicated his/her understanding and acceptance.     Dental Advisory Given  Plan Discussed with: Anesthesiologist, CRNA and Surgeon  Anesthesia Plan Comments: (Patient consented for risks of anesthesia including but not limited to:  -  adverse reactions to medications - damage to eyes, teeth, lips or other oral mucosa - nerve damage due to positioning  - sore throat or hoarseness - Damage to heart, brain, nerves, lungs, other parts of body or loss of life  Patient voiced understanding and assent.)          Anesthesia Quick Evaluation

## 2023-06-18 NOTE — Discharge Instructions (Signed)

## 2023-06-20 ENCOUNTER — Other Ambulatory Visit: Payer: Self-pay

## 2023-06-20 ENCOUNTER — Encounter
Admission: RE | Disposition: A | Discharge status: Home / Self Care | Payer: Self-pay | Source: Home / Self Care | Attending: Ophthalmology

## 2023-06-20 ENCOUNTER — Ambulatory Visit: Payer: Self-pay | Admitting: Anesthesiology

## 2023-06-20 ENCOUNTER — Ambulatory Visit
Admission: RE | Admit: 2023-06-20 | Discharge: 2023-06-20 | Disposition: A | Discharge status: Home / Self Care | Payer: Medicare Other | Attending: Ophthalmology | Admitting: Ophthalmology

## 2023-06-20 ENCOUNTER — Encounter: Payer: Self-pay | Admitting: Ophthalmology

## 2023-06-20 DIAGNOSIS — E1151 Type 2 diabetes mellitus with diabetic peripheral angiopathy without gangrene: Secondary | ICD-10-CM | POA: Insufficient documentation

## 2023-06-20 DIAGNOSIS — M1990 Unspecified osteoarthritis, unspecified site: Secondary | ICD-10-CM | POA: Diagnosis not present

## 2023-06-20 DIAGNOSIS — Z79899 Other long term (current) drug therapy: Secondary | ICD-10-CM | POA: Diagnosis not present

## 2023-06-20 DIAGNOSIS — E785 Hyperlipidemia, unspecified: Secondary | ICD-10-CM | POA: Diagnosis not present

## 2023-06-20 DIAGNOSIS — I10 Essential (primary) hypertension: Secondary | ICD-10-CM | POA: Diagnosis not present

## 2023-06-20 DIAGNOSIS — Z833 Family history of diabetes mellitus: Secondary | ICD-10-CM | POA: Insufficient documentation

## 2023-06-20 DIAGNOSIS — Z7902 Long term (current) use of antithrombotics/antiplatelets: Secondary | ICD-10-CM | POA: Diagnosis not present

## 2023-06-20 DIAGNOSIS — I471 Supraventricular tachycardia, unspecified: Secondary | ICD-10-CM | POA: Diagnosis not present

## 2023-06-20 DIAGNOSIS — E1136 Type 2 diabetes mellitus with diabetic cataract: Secondary | ICD-10-CM | POA: Diagnosis not present

## 2023-06-20 DIAGNOSIS — Z87891 Personal history of nicotine dependence: Secondary | ICD-10-CM | POA: Diagnosis not present

## 2023-06-20 DIAGNOSIS — H2511 Age-related nuclear cataract, right eye: Secondary | ICD-10-CM | POA: Diagnosis present

## 2023-06-20 DIAGNOSIS — D649 Anemia, unspecified: Secondary | ICD-10-CM | POA: Diagnosis not present

## 2023-06-20 HISTORY — PX: CATARACT EXTRACTION W/PHACO: SHX586

## 2023-06-20 HISTORY — DX: Presence of external hearing-aid: Z97.4

## 2023-06-20 HISTORY — DX: Ventricular premature depolarization: I49.3

## 2023-06-20 HISTORY — DX: Supraventricular tachycardia, unspecified: I47.10

## 2023-06-20 HISTORY — DX: Other specified cardiac arrhythmias: I49.8

## 2023-06-20 HISTORY — DX: Other ill-defined heart diseases: I51.89

## 2023-06-20 SURGERY — PHACOEMULSIFICATION, CATARACT, WITH IOL INSERTION
Anesthesia: Monitor Anesthesia Care | Site: Eye | Laterality: Right

## 2023-06-20 MED ORDER — FENTANYL CITRATE (PF) 100 MCG/2ML IJ SOLN
INTRAMUSCULAR | Status: AC
  Filled 2023-06-20: qty 2

## 2023-06-20 MED ORDER — FENTANYL CITRATE (PF) 100 MCG/2ML IJ SOLN
INTRAMUSCULAR | Status: DC | PRN
  Administered 2023-06-20 (×2): 25 ug via INTRAVENOUS

## 2023-06-20 MED ORDER — TETRACAINE HCL 0.5 % OP SOLN
1.0000 [drp] | OPHTHALMIC | Status: DC | PRN
  Administered 2023-06-20 (×3): 1 [drp] via OPHTHALMIC

## 2023-06-20 MED ORDER — SIGHTPATH DOSE#1 BSS IO SOLN
INTRAOCULAR | Status: DC | PRN
  Administered 2023-06-20: 2 mL

## 2023-06-20 MED ORDER — ARMC OPHTHALMIC DILATING DROPS
OPHTHALMIC | Status: AC
Start: 2023-06-20 — End: ?
  Filled 2023-06-20: qty 0.5

## 2023-06-20 MED ORDER — ARMC OPHTHALMIC DILATING DROPS
1.0000 | OPHTHALMIC | Status: DC | PRN
  Administered 2023-06-20 (×3): 1 via OPHTHALMIC

## 2023-06-20 MED ORDER — SIGHTPATH DOSE#1 BSS IO SOLN
INTRAOCULAR | Status: DC | PRN
  Administered 2023-06-20: 73 mL via OPHTHALMIC

## 2023-06-20 MED ORDER — SIGHTPATH DOSE#1 NA HYALUR & NA CHOND-NA HYALUR IO KIT
PACK | INTRAOCULAR | Status: DC | PRN
  Administered 2023-06-20: 1 via OPHTHALMIC

## 2023-06-20 MED ORDER — TETRACAINE HCL 0.5 % OP SOLN
OPHTHALMIC | Status: AC
  Filled 2023-06-20: qty 4

## 2023-06-20 MED ORDER — SIGHTPATH DOSE#1 BSS IO SOLN
INTRAOCULAR | Status: DC | PRN
  Administered 2023-06-20: 15 mL via INTRAOCULAR

## 2023-06-20 MED ORDER — CEFUROXIME OPHTHALMIC INJECTION 1 MG/0.1 ML
INJECTION | OPHTHALMIC | Status: DC | PRN
  Administered 2023-06-20: 1 mg via INTRACAMERAL

## 2023-06-20 MED ORDER — BRIMONIDINE TARTRATE-TIMOLOL 0.2-0.5 % OP SOLN
OPHTHALMIC | Status: DC | PRN
  Administered 2023-06-20: 1 [drp]

## 2023-06-20 SURGICAL SUPPLY — 19 items
BNDG EYE OVAL 2 1/8 X 2 5/8 (GAUZE/BANDAGES/DRESSINGS) IMPLANT
CANNULA ANT/CHMB 27G (MISCELLANEOUS) IMPLANT
CANNULA ANT/CHMB 27GA (MISCELLANEOUS) IMPLANT
CATARACT SUITE SIGHTPATH (MISCELLANEOUS) ×1 IMPLANT
FEE CATARACT SUITE SIGHTPATH (MISCELLANEOUS) ×1 IMPLANT
GLOVE BIOGEL PI IND STRL 8 (GLOVE) ×1 IMPLANT
GLOVE PI ULTRA LF STRL 7.5 (GLOVE) IMPLANT
GLOVE SURG PROTEXIS BL SZ6.5 (GLOVE) ×1 IMPLANT
GLOVE SURG SYN 6.5 PF PI BL (GLOVE) ×1 IMPLANT
LENS IOL TECNIS EYHANCE 21.0 (Intraocular Lens) IMPLANT
NDL FILTER BLUNT 18X1 1/2 (NEEDLE) ×1 IMPLANT
NDL RETROBULBAR .5 NSTRL (NEEDLE) IMPLANT
NEEDLE FILTER BLUNT 18X1 1/2 (NEEDLE) ×1 IMPLANT
PACK VIT ANT 23G (MISCELLANEOUS) IMPLANT
RING MALYGIN 7.0 (MISCELLANEOUS) IMPLANT
SUT ETHILON 10-0 CS-B-6CS-B-6 (SUTURE) IMPLANT
SUT VICRYL 9 0 (SUTURE) IMPLANT
SUTURE EHLN 10-0 CS-B-6CS-B-6 (SUTURE) IMPLANT
SYR 3ML LL SCALE MARK (SYRINGE) ×1 IMPLANT

## 2023-06-20 NOTE — Transfer of Care (Signed)
 Immediate Anesthesia Transfer of Care Note  Patient: Claire Washington  Procedure(s) Performed: CATARACT EXTRACTION PHACO AND INTRAOCULAR LENS PLACEMENT (IOC) RIGHT 6.23 00:32.8 (Right: Eye)  Patient Location: PACU  Anesthesia Type: MAC  Level of Consciousness: awake, alert  and patient cooperative  Airway and Oxygen Therapy: Patient Spontanous Breathing and Patient connected to supplemental oxygen  Post-op Assessment: Post-op Vital signs reviewed, Patient's Cardiovascular Status Stable, Respiratory Function Stable, Patent Airway and No signs of Nausea or vomiting  Post-op Vital Signs: Reviewed and stable  Complications: No notable events documented.

## 2023-06-20 NOTE — Anesthesia Postprocedure Evaluation (Signed)
 Anesthesia Post Note  Patient: Claire Washington  Procedure(s) Performed: CATARACT EXTRACTION PHACO AND INTRAOCULAR LENS PLACEMENT (IOC) RIGHT 6.23 00:32.8 (Right: Eye)  Patient location during evaluation: PACU Anesthesia Type: MAC Level of consciousness: awake and alert Pain management: pain level controlled Vital Signs Assessment: post-procedure vital signs reviewed and stable Respiratory status: spontaneous breathing, nonlabored ventilation, respiratory function stable and patient connected to nasal cannula oxygen Cardiovascular status: stable and blood pressure returned to baseline Postop Assessment: no apparent nausea or vomiting Anesthetic complications: no   No notable events documented.   Last Vitals:  Vitals:   06/20/23 1056 06/20/23 1100  BP: (!) 152/54 (!) 125/42  Pulse: 75 65  Resp: 19 15  Temp: (!) 36.4 C 36.4 C  SpO2: 98% 96%    Last Pain:  Vitals:   06/20/23 1100  TempSrc:   PainSc: 0-No pain                 Terryn Redner C Linna Thebeau

## 2023-06-20 NOTE — H&P (Signed)
 [facility]   Primary Care Physician:  Barbette Reichmann, MD Ophthalmologist: Dr. Lockie Mola  Pre-Procedure History & Physical: HPI:  Claire Washington is a 89 y.o. female here for ophthalmic surgery.   Past Medical History:  Diagnosis Date   Arthritis    Basal cell carcinoma    basel cell   Chronic cystitis    Diabetes mellitus type 2, controlled, without complications (HCC)    type 2 or unspecified type diabetes mellitus without mention of complication, not stated as uncontrolled   Disease of thyroid gland    Diverticulitis    Embolic stroke (HCC)    a. 07/2016 MRI Brain: small right superior cerebellum and left lateral occipital cortex infarcts;  b. 08/2016 Echo: EF 60-65%, Gr1 DD, mild MR;  c. 08/2016 30 Day Event Monitor: No significant tachy/bradyarrhythmias. No afib.   Follicular neoplasm of thyroid    Grade II diastolic dysfunction    Hemorrhage of rectum and anus    Hyperlipidemia    Hyperlipidemia, unspecified    Hypertension    Leg pain    Osteoarthritis    Pernicious anemia    PVC's (premature ventricular contractions)    Small vessel disease (HCC)    Spinal stenosis, lumbar region without neurogenic claudication    Subdural hematoma (HCC)    a. 2013 in setting of syncope.   SVT (supraventricular tachycardia) (HCC)    Syncope and collapse    a. 2013 - syncope and fall w/ resultant subdural hematoma; b. prev nl echo and stress test @ [facility].   Thyroid nodule    Ventricular bigeminy    Vertigo & Chronic Dizziness    a. Previously seen by ENT with Physical Therapy.  MRI 2000 small vessel disease   Wears hearing aid in both ears     Past Surgical History:  Procedure Laterality Date   BILATERAL SALPINGOOPHORECTOMY     BREAST CYST EXCISION     ? side, yrs ago   COLONOSCOPY  01/26/2009   Diverticulosis   CYSTOSCOPY     HEMORRHOIDECTOMY BY SIMPLE LIGATION     PR COLONOSCOPY FLX W/ENDOSCOPIC MUCOSAL RESECTION  09/02/2014   Procedure:  COLONOSCOPY, FLEXIBLE; WITH ENDOSCOPIC MUCOSAL RESECTION; Surgeon: Mayford Knife, MD; [facility]; Service: Gastroenterology   PR COLSC FLX W/RMVL OF TUMOR POLYP LESION SNARE TQ  09/02/2014   Procedure: COLONOSCOPY FLEX; W/REMOV TUMOR/LES BY SNARE; Surgeon: Mayford Knife, MD; [facility]; Service: Gastroenterology   SKIN SURGERY     TOTAL ABDOMINAL HYSTERECTOMY W/ BILATERAL SALPINGOOPHORECTOMY  1970s    Prior to Admission medications   Medication Sig Start Date End Date Taking? Authorizing Provider  acetaminophen (TYLENOL) 325 MG tablet Take 2 tablets (650 mg total) by mouth every 6 (six) hours as needed for mild pain, moderate pain, fever or headache (or Fever >/= 101). 01/18/22  Yes Esaw Grandchild A, DO  amLODipine (NORVASC) 2.5 MG tablet Take 1 tablet (2.5 mg total) by mouth at bedtime. Take both 5 mg pill and 2.5 mg pill at bedtime. (TOTAL DOSE 7.5 MG) 04/16/23 07/15/23 Yes Gollan, Tollie Pizza, MD  amLODipine (NORVASC) 5 MG tablet Take 1 tablet (5 mg total) by mouth at bedtime. Take both 5 mg pill and 2.5 mg pill at bedtime. (TOTAL DOSE 7.5 MG) 04/16/23 07/15/23 Yes Gollan, Tollie Pizza, MD  clopidogrel (PLAVIX) 75 MG tablet TAKE 1 TABLET BY MOUTH EVERY DAY 05/14/23  Yes Gollan, Tollie Pizza, MD  hydrALAZINE (APRESOLINE) 25 MG  tablet TAKE 1 TABLET BY MOUTH 3 TIMES DAILY AS NEEDED FOR SYSTOLIC BLOOD PRESSURE >160 11/29/21  Yes Gollan, Tollie Pizza, MD  magnesium hydroxide (MILK OF MAGNESIA) 400 MG/5ML suspension Take 5 mLs by mouth daily as needed for mild constipation.   Yes [provider]  Multiple Vitamins-Minerals (CENTRUM SILVER PO) Take 1 tablet by mouth daily. Reported on 06/11/2015   Yes [provider]  Multiple Vitamins-Minerals (ICAPS AREDS 2 PO) Take by mouth daily.   Yes [provider]  polyethylene glycol (MIRALAX / GLYCOLAX) 17 g packet Take 17 g by mouth daily as needed. 12/31/22  Yes Wieting, Richard, MD   vitamin B-12 (CYANOCOBALAMIN) 1000 MCG tablet Take 500 mcg by mouth daily.   Yes [provider]  Vitamin D, Ergocalciferol, (DRISDOL) 1.25 MG (50000 UT) CAPS capsule Take 50,000 Units by mouth every 7 (seven) days.   Yes [provider]  feeding supplement (ENSURE ENLIVE / ENSURE PLUS) LIQD Take 237 mLs by mouth 2 (two) times daily between meals. Patient not taking: Reported on 06/11/2023 12/15/22   Darlin Priestly, MD    Allergies as of 05/23/2023 - Review Complete 03/22/2023  Allergen Reaction Noted   Latex Dermatitis 03/16/2020   Hydrocortisone Swelling 05/31/2015   Sulfa antibiotics Other (See Comments) 10/08/2012   Tape Dermatitis 11/26/2017    Family History  Problem Relation Age of Onset   Colon cancer Mother    Heart disease Father    Heart attack Father    Prostate cancer Father    Stroke Father    Heart attack Brother    Asthma Daughter    Diabetes Other        Siblings   Breast cancer Neg Hx     Social History   Socioeconomic History   Marital status: Widowed    Spouse name: Not on file   Number of children: 3   Years of education: Not on file   Highest education level: Not on file  Occupational History   Not on file  Tobacco Use   Smoking status: Former    Current packs/day: 0.00    Average packs/day: 0.3 packs/day for 20.0 years (5.0 ttl pk-yrs)    Types: Cigarettes    Start date: 09/15/1973    Quit date: 09/15/1993    Years since quitting: 29.7   Smokeless tobacco: Never  Vaping Use   Vaping status: Never Used  Substance and Sexual Activity   Alcohol use: Yes    Alcohol/week: 2.0 standard drinks of alcohol    Types: 1 Glasses of wine, 1 Shots of liquor per week    Comment: occassional   Drug use: No   Sexual activity: Never  Other Topics Concern   Not on file  Social History Narrative   Not on file   Social Drivers of Health   Financial Resource Strain: Low Risk  (05/24/2023)   Received [facility]   Overall  Financial Resource Strain (CARDIA)    Difficulty of Paying Living Expenses: Not hard at all  Food Insecurity: No Food Insecurity (05/24/2023)   Received from [facility]   Hunger Vital Sign    Worried About Running Out of Food in the Last Year: Never true    Ran Out of Food in the Last Year: Never true  Transportation Needs: No Transportation Needs (05/24/2023)   Received from [facility] - Transportation    In the past 12 months, has lack  of transportation kept you from medical appointments or from getting medications?: No    Lack of Transportation (Non-Medical): No  Physical Activity: Not on file  Stress: Not on file  Social Connections: Not on file  Intimate Partner Violence: Not At Risk (12/26/2022)   Humiliation, Afraid, Rape, and Kick questionnaire    Fear of Current or Ex-Partner: No    Emotionally Abused: No    Physically Abused: No    Sexually Abused: No    Review of Systems: See HPI, otherwise negative ROS  Physical Exam: BP (!) 138/50   Pulse 80   Temp (!) 97 F (36.1 C) (Temporal)   Resp (!) 25   Ht 5' (1.524 m)   Wt 50.3 kg   SpO2 100%   BMI 21.68 kg/m  General:   Alert,  pleasant and cooperative in NAD Head:  Normocephalic and atraumatic. Lungs:  Clear to auscultation.    Heart:  Regular rate and rhythm.   Impression/Plan: Claire Washington is here for ophthalmic surgery.  Risks, benefits, limitations, and alternatives regarding ophthalmic surgery have been reviewed with the patient.  Questions have been answered.  All parties agreeable.   Lockie Mola, MD  06/20/2023, 10:15 AM

## 2023-06-20 NOTE — Op Note (Signed)
 LOCATION:  [facility]   PREOPERATIVE DIAGNOSIS:    Nuclear sclerotic cataract right eye. H25.11   POSTOPERATIVE DIAGNOSIS:  Nuclear sclerotic cataract right eye.     PROCEDURE:  Phacoemusification with posterior chamber intraocular lens placement of the right eye   ULTRASOUND TIME: Procedure(s): CATARACT EXTRACTION PHACO AND INTRAOCULAR LENS PLACEMENT (IOC) RIGHT 6.23 00:32.8 (Right)  LENS:   Implant Name Type Inv. Item Serial No. Manufacturer Lot No. LRB No. Used Action  LENS IOL TECNIS EYHANCE 21.0 - [id number] Intraocular Lens LENS IOL TECNIS EYHANCE 21.0 [id number] [employer]  Right 1 Implanted         SURGEON:  Deirdre Evener, MD   ANESTHESIA:  Topical with tetracaine drops and 2% Xylocaine jelly, augmented with 1% preservative-free intracameral lidocaine.    COMPLICATIONS:  None.   DESCRIPTION OF PROCEDURE:  The patient was identified in the holding room and transported to the operating room and placed in the supine position under the operating microscope.  The right eye was identified as the operative eye and it was prepped and draped in the usual sterile ophthalmic fashion.   A 1 millimeter clear-corneal paracentesis was made at the 12:00 position.  0.5 ml of preservative-free 1% lidocaine was injected into the anterior chamber. The anterior chamber was filled with Viscoat viscoelastic.  A 2.4 millimeter keratome was used to make a near-clear corneal incision at the 9:00 position.  A curvilinear capsulorrhexis was made with a cystotome and capsulorrhexis forceps.  Balanced salt solution was used to hydrodissect and hydrodelineate the nucleus.   Phacoemulsification was then used in stop and chop fashion to remove the lens nucleus and epinucleus.  The remaining cortex was then removed using the irrigation and aspiration handpiece. Provisc was then placed into the capsular bag to distend it for lens placement.  A lens was then injected into the capsular bag.  The  remaining viscoelastic was aspirated.   Wounds were hydrated with balanced salt solution.  The anterior chamber was inflated to a physiologic pressure with balanced salt solution.  No wound leaks were noted. Cefuroxime 0.1 ml of a 10mg /ml solution was injected into the anterior chamber for a dose of 1 mg of intracameral antibiotic at the completion of the case.   Timolol and Brimonidine drops were applied to the eye.  The patient was taken to the recovery room in stable condition without complications of anesthesia or surgery.   Jalynn Waddell 06/20/2023, 10:54 AM

## 2023-06-21 ENCOUNTER — Telehealth: Payer: Self-pay | Admitting: Cardiovascular Disease

## 2023-06-21 ENCOUNTER — Encounter: Payer: Self-pay | Admitting: Ophthalmology

## 2023-06-21 MED ORDER — CLOPIDOGREL BISULFATE 75 MG PO TABS
75.0000 mg | ORAL_TABLET | Freq: Every day | ORAL | 0 refills | Status: DC
Start: 2023-06-21 — End: 2023-09-20

## 2023-06-21 NOTE — Telephone Encounter (Signed)
*  STAT* If patient is at the pharmacy, call can be transferred to refill team.   1. Which medications need to be refilled? (please list name of each medication and dose if known) clopidogrel (PLAVIX) 75 MG tablet   2. Which pharmacy/location (including street and city if local pharmacy) is medication to be sent to?  [id number] [address], [place] [phone]    3. Do they need a 30 day or 90 day supply? 90  Patient is out of medication

## 2023-06-21 NOTE — Telephone Encounter (Signed)
 Requested Prescriptions   Signed Prescriptions Disp Refills  . clopidogrel (PLAVIX) 75 MG tablet 90 tablet 0    Sig: Take 1 tablet (75 mg total) by mouth daily.    Authorizing Provider: Antonieta Iba    Ordering User: Kendrick Fries

## 2023-07-04 ENCOUNTER — Ambulatory Visit: Admission: RE | Admit: 2023-07-04 | Payer: Medicare Other | Source: Home / Self Care | Admitting: Ophthalmology

## 2023-07-04 ENCOUNTER — Encounter: Admission: RE | Payer: Self-pay | Source: Home / Self Care

## 2023-07-04 SURGERY — PHACOEMULSIFICATION, CATARACT, WITH IOL INSERTION
Anesthesia: Topical | Laterality: Left

## 2023-08-12 NOTE — Progress Notes (Unsigned)
 Cardiology Office Note  Date:  08/13/2023   ID:  Claire Washington, DOB Oct 06, 1924, MRN [id number]  PCP:  Claire Baumgarten, MD   Chief Complaint  Patient presents with   12 month follow up     "Doing well."    HPI:  Claire Washington  is a 89 yr-old woman with history of   hypertension, benign positional vertigo,  osteoarthritis,  hyperlipidemia,  chronic cystitis,  diabetes,  Long smoking history until age 34 Previous TIAs per the patient, CVA 2018 Carotid stenosis <39% b/l in 2019 Chronic  dizziness in her head, gait instability, for years, progressive who presents for routine followup of her dizziness, hypertension, TIA/CVAs  Last seen by myself in clinic 2/24 Lives [facility] the [facility]  In follow-up today she presents with a friend Reports developing acute left foot toe pain Unable to weight-bear, presents today with a sock, unable to put her shoe on "Left leg little swelling" After her visit today will plan on going to urgent care Denies prior history of gout No trauma to her foot or toe that she is aware of  Denies chest pain or shortness of breath on exertion Otherwise reports feeling well  Very cautious with her diet given prior history diverticulitis Does not eat out much  October 2023 in the hospital 5 days in hospital with nausea vomiting diarrhea,  Sigmoid diverticulitis, was very weak Discharged home with PT  Reports that she is comfortable off her Lipitor Off lipitor, total chol 270, Was 170 on lipitior  On amlodipine  7.5 mg daily  EKG personally reviewed by myself on todays visit EKG Interpretation Date/Time:  Monday August 13 2023 12:04:47 EDT Ventricular Rate:  76 PR Interval:  146 QRS Duration:  74 QT Interval:  366 QTC Calculation: 411 R Axis:   -4  Text Interpretation: Normal sinus rhythm Normal ECG When compared with ECG of 14-Apr-2021 11:42, Premature ventricular complexes are no longer Present Confirmed by Claire Washington ([id number]) on 08/13/2023  12:24:32 PM    past medical history reviewed ER September 27, 2020 Weakness, given fluids felt better  ER September 29, 2020 for weakness Given fluids, felt better  In the er 09/2020 generalized weakness and dizziness with standing that is been going on for about 4 months, worse in the last few weeks.    Past medical history reviewed Holter monitor reviewed from primary care, this shows predominant normal sinus rhythm, rare episodes of narrow complex tachycardia longest 6 beats, 3 beat run wide-complex  Echo 11/2020 reviewed in detail  1. Left ventricular ejection fraction, by estimation, is 70 to 75%. The  left ventricle has hyperdynamic function. The left ventricle has no  regional wall motion abnormalities. Left ventricular diastolic parameters  are consistent with Grade II diastolic  dysfunction (pseudonormalization).   2. Right ventricular systolic function is normal. The right ventricular  size is normal.   3. The mitral valve is degenerative. Trivial mitral valve regurgitation.   4. The aortic valve was not well visualized. Aortic valve regurgitation  is not visualized.   5. The inferior vena cava is normal in size with greater than 50%  respiratory variability, suggesting right atrial pressure of 3 mmHg.   Previously seen by neurology for chronic dizziness MRI brain without contrast done on 11/30/2017 which showed moderate atrophy and moderate microvascular ischemic changes. --old cerebellar stroke, microvascular ischemic changes, and inner ear issues: worsened.  Echo 08/2016 Normal LV function No significant valve disease No indication from the echo as to cause  of dizziness  Event monitor  08/2016 Normal sinus rhythm Rare PVCs, <1% No other significant arrhythmia noted Symptoms of dizziness did not seem to correlate to underlying arrhythmia  Chronic equilibrium and dizziness dating back several years, worse recently Difficult time with equilibrium.  Working with  PT   PMH:   has a past medical history of Arthritis, Basal cell carcinoma, Chronic cystitis, Diabetes mellitus type 2, controlled, without complications (HCC), Disease of thyroid  gland, Diverticulitis, Embolic stroke (HCC), Follicular neoplasm of thyroid , Grade II diastolic dysfunction, Hemorrhage of rectum and anus, Hyperlipidemia, Hyperlipidemia, unspecified, Hypertension, Leg pain, Osteoarthritis, Pernicious anemia, PVC's (premature ventricular contractions), Small vessel disease (HCC), Spinal stenosis, lumbar region without neurogenic claudication, Subdural hematoma (HCC), SVT (supraventricular tachycardia) (HCC), Syncope and collapse, Thyroid  nodule, Ventricular bigeminy, Vertigo & Chronic Dizziness, and Wears hearing aid in both ears.  PSH:    Past Surgical History:  Procedure Laterality Date   BILATERAL SALPINGOOPHORECTOMY     BREAST CYST EXCISION     ? side, yrs ago   CATARACT EXTRACTION W/PHACO Right 06/20/2023   Procedure: CATARACT EXTRACTION PHACO AND INTRAOCULAR LENS PLACEMENT (IOC) RIGHT 6.23 00:32.8;  Surgeon: Annell Kidney, MD;  [facility];  Service: Ophthalmology;  Laterality: Right;   COLONOSCOPY  01/26/2009   Diverticulosis   CYSTOSCOPY     HEMORRHOIDECTOMY BY SIMPLE LIGATION     PR COLONOSCOPY FLX W/ENDOSCOPIC MUCOSAL RESECTION  09/02/2014   Procedure: COLONOSCOPY, FLEXIBLE; WITH ENDOSCOPIC MUCOSAL RESECTION; Surgeon: Valli Gaw, MD; [facility]; Service: Gastroenterology   PR COLSC FLX W/RMVL OF TUMOR POLYP LESION SNARE TQ  09/02/2014   Procedure: COLONOSCOPY FLEX; W/REMOV TUMOR/LES BY SNARE; Surgeon: Valli Gaw, MD; [facility]: [facility]; Service: Gastroenterology   SKIN SURGERY     TOTAL ABDOMINAL HYSTERECTOMY W/ BILATERAL SALPINGOOPHORECTOMY  1970s    Current Outpatient Medications  Medication Sig Dispense Refill   acetaminophen  (TYLENOL ) 325 MG tablet Take 2 tablets (650 mg total) by mouth  every 6 (six) hours as needed for mild pain, moderate pain, fever or headache (or Fever >/= 101).     amLODipine  (NORVASC ) 2.5 MG tablet Take 1 tablet (2.5 mg total) by mouth at bedtime. Take both 5 mg pill and 2.5 mg pill at bedtime. (TOTAL DOSE 7.5 MG) 90 tablet 3   amLODipine  (NORVASC ) 5 MG tablet Take 1 tablet (5 mg total) by mouth at bedtime. Take both 5 mg pill and 2.5 mg pill at bedtime. (TOTAL DOSE 7.5 MG) 90 tablet 3   clopidogrel  (PLAVIX ) 75 MG tablet Take 1 tablet (75 mg total) by mouth daily. 90 tablet 0   feeding supplement (ENSURE ENLIVE / ENSURE PLUS) LIQD Take 237 mLs by mouth 2 (two) times daily between meals.     hydrALAZINE  (APRESOLINE ) 25 MG tablet TAKE 1 TABLET BY MOUTH 3 TIMES DAILY AS NEEDED FOR SYSTOLIC BLOOD PRESSURE >160 90 tablet 3   magnesium  hydroxide (MILK OF MAGNESIA) 400 MG/5ML suspension Take 5 mLs by mouth daily as needed for mild constipation.     Multiple Vitamins-Minerals (CENTRUM SILVER PO) Take 1 tablet by mouth daily. Reported on 06/11/2015     Multiple Vitamins-Minerals (ICAPS AREDS 2 PO) Take by mouth daily.     polyethylene glycol (MIRALAX  / GLYCOLAX ) 17 g packet Take 17 g by mouth daily as needed. 30 each 0   vitamin B-12 (CYANOCOBALAMIN ) 1000 MCG tablet Take 500 mcg by mouth daily.     Vitamin D , Ergocalciferol , (DRISDOL ) 1.25 MG (50000  UT) CAPS capsule Take 50,000 Units by mouth every 7 (seven) days.     No current facility-administered medications for this visit.    Allergies:   Latex, Hydrocortisone, Sulfa antibiotics, and Tape   Social History:  The patient  reports that she quit smoking about 29 years ago. Her smoking use included cigarettes. She started smoking about 49 years ago. She has a 5 pack-year smoking history. She has never used smokeless tobacco. She reports current alcohol use of about 2.0 standard drinks of alcohol per week. She reports that she does not use drugs.   Family History:   family history includes Asthma in her daughter;  Colon cancer in her mother; Diabetes in an other family member; Heart attack in her brother and father; Heart disease in her father; Prostate cancer in her father; Stroke in her father.   Review of Systems  Constitutional: Negative.   HENT: Negative.    Respiratory: Negative.    Cardiovascular: Negative.   Gastrointestinal: Negative.   Musculoskeletal: Negative.        Leg weakness  Neurological: Negative.   Psychiatric/Behavioral: Negative.    All other systems reviewed and are negative.   PHYSICAL EXAM: VS:  BP 130/60 (BP Location: Left Arm, Patient Position: Sitting, Cuff Size: Normal)   Pulse 76   Ht 5' (1.524 m)   Wt 109 lb (49.4 kg)   SpO2 96%   BMI 21.29 kg/m  , BMI Body mass index is 21.29 kg/m. Constitutional:  oriented to person, place, and time. No distress.  HENT:  Head: Grossly normal Eyes:  no discharge. No scleral icterus.  Neck: No JVD, no carotid bruits  Cardiovascular: Regular rate and rhythm, no murmurs appreciated Pulmonary/Chest: Clear to auscultation bilaterally, no wheezes or rails Abdominal: Soft.  no distension.  no tenderness.  Musculoskeletal: Normal range of motion, pain in her toe left foot Neurological:  normal muscle tone. Coordination normal. No atrophy Skin: Skin warm and dry Psychiatric: normal affect, pleasant   Recent Labs: 12/25/2022: ALT 14 12/31/2022: BUN 16; Creatinine, Ser 0.54; Hemoglobin 14.5; Magnesium  2.1; Platelets 251; Potassium 4.0; Sodium 139    Lipid Panel No results found for: "CHOL", "HDL", "LDLCALC", "TRIG"    Wt Readings from Last 3 Encounters:  08/13/23 109 lb (49.4 kg)  06/20/23 111 lb (50.3 kg)  12/31/22 100 lb 15.5 oz (45.8 kg)     ASSESSMENT AND PLAN:  Essential hypertension Continue amlodipine  7.5 mg in the evening,  Blood pressure stable  Dizziness Chronic issue, does not appear to be a cardiac issue MRI showing  Strokes, microvascular disease  followed by neurology Chronic gait instability, no  recent falls.  Chronic weakness Working with PT, uses a walker, has motorized scooter Lives at the [place]  Stroke Multiple embolic strokes on MRI carotid disease on the right followed by Dr. Vonna Guardian Nonobstructive disease On Plavix ,  Previously encouraged by family to stop her statin, she does not want to restart  Near syncope/syncope Numerous episodes through 2022 Seen in the emergency room, diagnosed with " dehydration" Recommend she stay hydrated  Smoking hx Currently a nonsmoker  Type 2 diabetes mellitus with other circulatory complication, without long-term current use of insulin  (HCC) Managed by primary care  Mixed hyperlipidemia Goal LDL less than 70,  Prefers not to be on Lipitor,  Cholesterol running above goal, she is aware   Orders Placed This Encounter  Procedures   EKG 12-Lead     Signed, Juanda Noon, M.D., Ph.D. 08/13/2023  [facility] [place], [place] [phone]

## 2023-08-13 ENCOUNTER — Ambulatory Visit: Payer: BLUE CROSS/BLUE SHIELD | Attending: Cardiovascular Disease | Admitting: Cardiovascular Disease

## 2023-08-13 ENCOUNTER — Encounter: Payer: Self-pay | Admitting: Cardiovascular Disease

## 2023-08-13 VITALS — BP 130/60 | HR 76 | Ht 60.0 in | Wt 109.0 lb

## 2023-08-13 DIAGNOSIS — I739 Peripheral vascular disease, unspecified: Secondary | ICD-10-CM | POA: Insufficient documentation

## 2023-08-13 DIAGNOSIS — E119 Type 2 diabetes mellitus without complications: Secondary | ICD-10-CM | POA: Diagnosis present

## 2023-08-13 DIAGNOSIS — I340 Nonrheumatic mitral (valve) insufficiency: Secondary | ICD-10-CM | POA: Diagnosis present

## 2023-08-13 DIAGNOSIS — R42 Dizziness and giddiness: Secondary | ICD-10-CM | POA: Diagnosis present

## 2023-08-13 DIAGNOSIS — R0600 Dyspnea, unspecified: Secondary | ICD-10-CM | POA: Insufficient documentation

## 2023-08-13 DIAGNOSIS — R0989 Other specified symptoms and signs involving the circulatory and respiratory systems: Secondary | ICD-10-CM | POA: Insufficient documentation

## 2023-08-13 DIAGNOSIS — I10 Essential (primary) hypertension: Secondary | ICD-10-CM | POA: Diagnosis present

## 2023-08-13 DIAGNOSIS — E785 Hyperlipidemia, unspecified: Secondary | ICD-10-CM | POA: Diagnosis present

## 2023-08-13 DIAGNOSIS — E1159 Type 2 diabetes mellitus with other circulatory complications: Secondary | ICD-10-CM | POA: Diagnosis present

## 2023-08-13 DIAGNOSIS — Z8673 Personal history of transient ischemic attack (TIA), and cerebral infarction without residual deficits: Secondary | ICD-10-CM | POA: Insufficient documentation

## 2023-08-13 DIAGNOSIS — I6523 Occlusion and stenosis of bilateral carotid arteries: Secondary | ICD-10-CM | POA: Diagnosis not present

## 2023-08-13 DIAGNOSIS — R55 Syncope and collapse: Secondary | ICD-10-CM | POA: Insufficient documentation

## 2023-08-13 NOTE — Patient Instructions (Signed)

## 2023-08-21 ENCOUNTER — Ambulatory Visit (INDEPENDENT_AMBULATORY_CARE_PROVIDER_SITE_OTHER): Admitting: Podiatry

## 2023-08-21 ENCOUNTER — Ambulatory Visit (INDEPENDENT_AMBULATORY_CARE_PROVIDER_SITE_OTHER)

## 2023-08-21 ENCOUNTER — Encounter: Payer: Self-pay | Admitting: Podiatry

## 2023-08-21 DIAGNOSIS — M7752 Other enthesopathy of left foot: Secondary | ICD-10-CM

## 2023-08-21 DIAGNOSIS — M7989 Other specified soft tissue disorders: Secondary | ICD-10-CM | POA: Diagnosis not present

## 2023-08-21 DIAGNOSIS — M2042 Other hammer toe(s) (acquired), left foot: Secondary | ICD-10-CM | POA: Diagnosis not present

## 2023-08-21 MED ORDER — BETAMETHASONE SOD PHOS & ACET 6 (3-3) MG/ML IJ SUSP
3.0000 mg | Freq: Once | INTRAMUSCULAR | Status: AC
  Administered 2023-08-21: 3 mg via INTRA_ARTICULAR

## 2023-08-21 NOTE — Progress Notes (Signed)
 Chief Complaint  Patient presents with   Toe Pain    "It's the left big toe.  It hurts." N - toe hurts L - hallux left D - 2 weeks O - suddenly, gotten a little better C - it was swollen and red, sharp pain A - put pressure on it T - soaked it in [employer], [facility] - xrays, steroid, saw PCP    HPI: 89 y.o. female presenting today as a new patient for evaluation of pain and tenderness to the plantar aspect of the left great toe.  Onset for about 2 weeks.  Idiopathic.  She noticed redness with swelling to the left great toe.  She actually went to [facility] where x-rays were taken negative for fracture and oral steroid prescribed.  There has been some improvement she but she continues to notice swelling specifically to the plantar aspect of the left great toe and tenderness  Past Medical History:  Diagnosis Date   Arthritis    Basal cell carcinoma    basel cell   Chronic cystitis    Diabetes mellitus type 2, controlled, without complications (HCC)    type 2 or unspecified type diabetes mellitus without mention of complication, not stated as uncontrolled   Disease of thyroid  gland    Diverticulitis    Embolic stroke (HCC)    a. 07/2016 MRI Brain: small right superior cerebellum and left lateral occipital cortex infarcts;  b. 08/2016 Echo: EF 60-65%, Gr1 DD, mild MR;  c. 08/2016 30 Day Event Monitor: No significant tachy/bradyarrhythmias. No afib.   Follicular neoplasm of thyroid     Grade II diastolic dysfunction    Hemorrhage of rectum and anus    Hyperlipidemia    Hyperlipidemia, unspecified    Hypertension    Leg pain    Osteoarthritis    Pernicious anemia    PVC's (premature ventricular contractions)    Small vessel disease (HCC)    Spinal stenosis, lumbar region without neurogenic claudication    Subdural hematoma (HCC)    a. 2013 in setting of syncope.   SVT (supraventricular tachycardia) (HCC)    Syncope and collapse    a. 2013 - syncope and fall w/ resultant  subdural hematoma; b. prev nl echo and stress test @ [facility].   Thyroid  nodule    Ventricular bigeminy    Vertigo & Chronic Dizziness    a. Previously seen by ENT with Physical Therapy.  MRI 2000 small vessel disease   Wears hearing aid in both ears     Past Surgical History:  Procedure Laterality Date   BILATERAL SALPINGOOPHORECTOMY     BREAST CYST EXCISION     ? side, yrs ago   CATARACT EXTRACTION W/PHACO Right 06/20/2023   Procedure: CATARACT EXTRACTION PHACO AND INTRAOCULAR LENS PLACEMENT (IOC) RIGHT 6.23 00:32.8;  Surgeon: Annell Kidney, MD;  Location: [facility];  Service: Ophthalmology;  Laterality: Right;   COLONOSCOPY  01/26/2009   Diverticulosis   CYSTOSCOPY     HEMORRHOIDECTOMY BY SIMPLE LIGATION     PR COLONOSCOPY FLX W/ENDOSCOPIC MUCOSAL RESECTION  09/02/2014   Procedure: COLONOSCOPY, FLEXIBLE; WITH ENDOSCOPIC MUCOSAL RESECTION; Surgeon: Valli Gaw, MD; [facility]; Service: Gastroenterology   PR COLSC FLX W/RMVL OF TUMOR POLYP LESION SNARE TQ  09/02/2014   Procedure: COLONOSCOPY FLEX; W/REMOV TUMOR/LES BY SNARE; Surgeon: Valli Gaw, MD; Location: [facility]; Service: Gastroenterology   SKIN SURGERY     TOTAL ABDOMINAL HYSTERECTOMY W/ BILATERAL SALPINGOOPHORECTOMY  1970s    Allergies  Allergen Reactions   Latex Dermatitis   Hydrocortisone Swelling   Sulfa Antibiotics Other (See Comments)    Reaction: unknown Other reaction(s): Not available   Tape Dermatitis     Physical Exam: General: The patient is alert and oriented x3 in no acute distress.  Dermatology: Skin is warm, dry and supple bilateral lower extremities.  No open wounds  Vascular: Palpable pedal pulses bilaterally. Capillary refill within normal limits.  No appreciable edema.  No erythema.  Clinically there is no indication of infection  Neurological: Grossly intact via light touch  Musculoskeletal Exam: Mallet toe noted  left hallux with hammertoes to the lesser digits.  There is a significant amount of soft tissue enlargement to the plantar aspect of the left great toe at the level of the IPJ and distal phalanx.  Associated tenderness with palpation of this area.  Radiographic Exam LT foot 08/21/2023:  Diffuse osseous degenerative changes noted which is almost expected given the patient's age.  No acute fracture identified and no osseous erosions concerning for underlying osteomyelitis  Assessment/Plan of Care: 1.  Palpable soft tissue enlargement to the plantar aspect of the left great toe at the level of the IPJ and distal phalanx with associated tenderness 2.  Capsulitis left great toe  -Patient evaluated.  X-rays reviewed -Injection of 0.5 cc Celestone Soluspan injected to the plantar aspect of the left great toe -Recommend good supportive tennis shoes or shoes that do not irritate the great toe and support the foot -Return to clinic in 3 weeks.  If there is no improvement in 3 weeks and there is persistent soft tissue enlargement I do believe a possible MRI of the toes would be beneficial      Dot Gazella, DPM [facility]  Dr. Dot Gazella, DPM    [address] [place], [place] [address]                Office [phone]  Fax [phone]

## 2023-09-11 ENCOUNTER — Ambulatory Visit: Admitting: Podiatry

## 2023-09-12 ENCOUNTER — Other Ambulatory Visit: Payer: Self-pay | Admitting: Cardiovascular Disease

## 2023-09-19 ENCOUNTER — Other Ambulatory Visit: Payer: Self-pay | Admitting: Cardiovascular Disease

## 2023-09-20 ENCOUNTER — Telehealth: Payer: Self-pay | Admitting: Cardiovascular Disease

## 2023-09-20 MED ORDER — CLOPIDOGREL BISULFATE 75 MG PO TABS: 75.0000 mg | ORAL_TABLET | Freq: Every day | ORAL | 3 refills | Status: AC

## 2023-09-20 NOTE — Telephone Encounter (Signed)
*  STAT* If patient is at the pharmacy, call can be transferred to refill team.   1. Which medications need to be refilled? (please list name of each medication and dose if known)   clopidogrel  (PLAVIX ) 75 MG tablet   2. Would you like to learn more about the convenience, safety, & potential cost savings by using the [facility]?   3. Are you open to using the [facility] (Type [facility]. ).  4. Which pharmacy/location (including street and city if local pharmacy) is medication to be sent to?  [id number] [place], [place] [phone]   5. Do they need a 30 day or 90 day supply?   90 day  Patient stated she is completely out of this medication.

## 2023-09-20 NOTE — Telephone Encounter (Signed)
 Pt's medication was sent to pt's pharmacy as requested. Confirmation received.

## 2023-11-01 ENCOUNTER — Ambulatory Visit
Admission: RE | Admit: 2023-11-01 | Discharge: 2023-11-01 | Disposition: A | Discharge status: Home / Self Care | Source: Ambulatory Visit | Attending: Internal Medicine | Admitting: Internal Medicine

## 2023-11-01 ENCOUNTER — Other Ambulatory Visit: Payer: Self-pay | Admitting: Internal Medicine

## 2023-11-01 DIAGNOSIS — M7989 Other specified soft tissue disorders: Secondary | ICD-10-CM | POA: Diagnosis present

## 2024-01-24 ENCOUNTER — Other Ambulatory Visit: Payer: Self-pay | Admitting: Internal Medicine

## 2024-01-24 DIAGNOSIS — I10 Essential (primary) hypertension: Secondary | ICD-10-CM

## 2024-01-24 DIAGNOSIS — L299 Pruritus, unspecified: Secondary | ICD-10-CM

## 2024-01-24 DIAGNOSIS — I890 Lymphedema, not elsewhere classified: Secondary | ICD-10-CM

## 2024-01-24 DIAGNOSIS — R600 Localized edema: Secondary | ICD-10-CM

## 2024-01-24 DIAGNOSIS — R140 Abdominal distension (gaseous): Secondary | ICD-10-CM

## 2024-01-28 ENCOUNTER — Ambulatory Visit
Admission: RE | Admit: 2024-01-28 | Discharge: 2024-01-28 | Disposition: A | Discharge status: Home / Self Care | Source: Ambulatory Visit | Attending: Internal Medicine | Admitting: Internal Medicine

## 2024-01-28 DIAGNOSIS — I890 Lymphedema, not elsewhere classified: Secondary | ICD-10-CM | POA: Insufficient documentation

## 2024-01-28 DIAGNOSIS — R600 Localized edema: Secondary | ICD-10-CM | POA: Insufficient documentation

## 2024-01-28 DIAGNOSIS — I10 Essential (primary) hypertension: Secondary | ICD-10-CM | POA: Insufficient documentation

## 2024-01-28 DIAGNOSIS — L299 Pruritus, unspecified: Secondary | ICD-10-CM | POA: Diagnosis present

## 2024-01-28 DIAGNOSIS — R140 Abdominal distension (gaseous): Secondary | ICD-10-CM | POA: Insufficient documentation

## 2024-04-03 ENCOUNTER — Emergency Department

## 2024-04-03 ENCOUNTER — Other Ambulatory Visit: Payer: Self-pay

## 2024-04-03 ENCOUNTER — Emergency Department: Admission: EM | Admit: 2024-04-03 | Discharge: 2024-04-03 | Disposition: A | Discharge status: Home / Self Care

## 2024-04-03 DIAGNOSIS — R0602 Shortness of breath: Secondary | ICD-10-CM | POA: Insufficient documentation

## 2024-04-03 DIAGNOSIS — R0789 Other chest pain: Secondary | ICD-10-CM | POA: Insufficient documentation

## 2024-04-03 DIAGNOSIS — H538 Other visual disturbances: Secondary | ICD-10-CM | POA: Insufficient documentation

## 2024-04-03 DIAGNOSIS — R079 Chest pain, unspecified: Secondary | ICD-10-CM

## 2024-04-03 LAB — CBC
HCT: 49.3 % — ABNORMAL HIGH (ref 36.0–46.0)
Hemoglobin: 16 g/dL — ABNORMAL HIGH (ref 12.0–15.0)
MCH: 27.6 pg (ref 26.0–34.0)
MCHC: 32.5 g/dL (ref 30.0–36.0)
MCV: 85 fL (ref 80.0–100.0)
Platelets: 244 K/uL (ref 150–400)
RBC: 5.8 MIL/uL — ABNORMAL HIGH (ref 3.87–5.11)
RDW: 14.8 % (ref 11.5–15.5)
WBC: 11.1 K/uL — ABNORMAL HIGH (ref 4.0–10.5)
nRBC: 0 % (ref 0.0–0.2)

## 2024-04-03 LAB — BASIC METABOLIC PANEL WITH GFR
Anion gap: 10 (ref 5–15)
BUN: 15 mg/dL (ref 8–23)
CO2: 29 mmol/L (ref 22–32)
Calcium: 9.4 mg/dL (ref 8.9–10.3)
Chloride: 99 mmol/L (ref 98–111)
Creatinine, Ser: 0.65 mg/dL (ref 0.44–1.00)
GFR, Estimated: >60 mL/min (ref >60)
Glucose, Bld: 118 mg/dL — ABNORMAL HIGH (ref 70–99)
Potassium: 4.4 mmol/L (ref 3.5–5.1)
Sodium: 138 mmol/L (ref 135–145)

## 2024-04-03 LAB — TROPONIN T, HIGH SENSITIVITY: Troponin T High Sensitivity: <15 ng/L (ref 0–19)

## 2024-04-03 NOTE — ED Provider Notes (Signed)
 [facility] Provider Note    Event Date/Time   First MD Initiated Contact with Patient 04/03/24 1724     (approximate)   History   Shortness of Breath and Chest Pain   HPI  Claire Washington is a 89 y.o. female presenting with concern of chest pain and flushness sensation.  Patient tells me that since Sunday she has been having intermittent episodes of chest discomfort with flushness sensation and difficulty taking a deep breath.  These episodes are sporadic occurring maybe about twice a day, lasting for 5 minutes at a time.  They are not related with exertion or meals, and they spontaneously resolve without any other intervention.  She states that she has been taking Tums more frequently and sometimes this seems to help as the symptoms do seem to be burning in nature and sometimes are accompanied with nausea.  No lightheadedness or syncopal episodes.  No known history of coronary artery disease, she does follow-up with a cardiologist and has been told that she is doing well without known cardiac history.  Denies associated fevers chills currently asymptomatic and feeling well.  She went to urgent care who referred her to our emergency department for further assessment and evaluation.  Has not had any swelling in her extremities, she was able to get around her house without having to sit down to catch her breath and denies any dyspnea on exertion at this time.  Shortness of breath symptoms are primarily when she has onset of her symptoms with the flushness, she feels like she is not able to take a deep breath when the symptoms occur, but currently asymptomatic.     Physical Exam   Triage Vital Signs: ED Triage Vitals  Encounter Vitals Group     BP 04/03/24 1450 (!) 169/67     Girls Systolic BP Percentile --      Girls Diastolic BP Percentile --      Boys Systolic BP Percentile --      Boys Diastolic BP Percentile --      Pulse Rate 04/03/24 1450 97     Resp  04/03/24 1450 18     Temp 04/03/24 1450 98 F (36.7 C)     Temp src --      SpO2 04/03/24 1450 97 %     Weight 04/03/24 1448 108 lb 14.5 oz (49.4 kg)     Height 04/03/24 1448 5' (1.524 m)     Head Circumference --      Peak Flow --      Pain Score 04/03/24 1448 0     Pain Loc --      Pain Education --      Exclude from Growth Chart --     Most recent vital signs: Vitals:   04/03/24 1450  BP: (!) 169/67  Pulse: 97  Resp: 18  Temp: 98 F (36.7 C)  SpO2: 97%     General: Awake, no distress.  CV:  Good peripheral perfusion.  No extremity swelling Resp:  Normal effort.  Abd:  No distention.  Soft nontender Other:     ED Results / Procedures / Treatments   Labs (all labs ordered are listed, but only abnormal results are displayed) Labs Reviewed  BASIC METABOLIC PANEL WITH GFR - Abnormal; Notable for the following components:      Result Value   Glucose, Bld 118 (*)    All other components within normal limits  CBC - Abnormal; Notable for  the following components:   WBC 11.1 (*)    RBC 5.80 (*)    Hemoglobin 16.0 (*)    HCT 49.3 (*)    All other components within normal limits  TROPONIN T, HIGH SENSITIVITY  TROPONIN T, HIGH SENSITIVITY     EKG  Sinus rhythm with occasional PVCs, rate of about 90, axis of 0, intervals appear to be within normal limits, no obvious ischemia noted on this EKG   RADIOLOGY No acute cardiopulmonary findings appreciated on my independent interpretation  PROCEDURES:  Critical Care performed: No  Procedures   MEDICATIONS ORDERED IN ED: Medications - No data to display   IMPRESSION / MDM / ASSESSMENT AND PLAN / ED COURSE  I reviewed the triage vital signs and the nursing notes.                               Patient's presentation is most consistent with acute complicated illness / injury requiring diagnostic workup.  89 year old female presenting today with concern of chest pain and shortness of breath.  Triage note  mentions some blurred vision but patient does not have any complaint of this to me.  Seems brief episodes of chest pain accompanied with shortness of breath lasting about 5 minutes in the day and then resolving spontaneously.  No known history of cardiac disease.  At this time she appears well she is slightly hypertensive but remaining vitals are reassuring.  Troponin is negative EKG is without acute ischemic findings.  Chest x-ray also without acute findings.  Given the presentation at this time believe reasonable for discharge home with outpatient urgent cardiology follow-up.  I discussed return precautions, patient is agreeable with the plan.       FINAL CLINICAL IMPRESSION(S) / ED DIAGNOSES   Final diagnoses:  Chest pain, unspecified type     Rx / DC Orders   ED Discharge Orders          Ordered    Ambulatory referral to Cardiology       Comments: If you have not heard from the Cardiology office within the next 72 hours please call [phone].   04/03/24 1814             Note:  This document was prepared using Dragon voice recognition software and may include unintentional dictation errors.   Fernand Rossie HERO, MD 04/03/24 [id number]

## 2024-04-03 NOTE — ED Notes (Signed)
Fall bundle in place

## 2024-04-03 NOTE — ED Triage Notes (Signed)
 Pt here from [facility] with SOB and minor CP since Sun. Pt states she thought her SOB and intermittent hives was a reaction to her recent Kenalog injection but the hives have resolved. Pt states she has had 2 episodes of sharp CP and some blurred vision on yesterday but none today. Pt endorses some nausea.

## 2024-04-03 NOTE — Discharge Instructions (Signed)
 You were seen today due to concern of chest pain.  At this time fortunately your blood work is very reassuring.  As we have discussed you may try a 2-week course of over-the-counter Nexium, take 1 tablet of this about 1 hour before meals every morning for 2 weeks.  Regardless I am concerned that your symptoms may be related to your heart, I have placed referral to the cardiologist, they should contact you within the next 2 or 3 days for reevaluation of your symptoms.  If you have any worsening of symptoms such as increased chest pain, shortness of breath, lightheadedness, or any other symptoms you find concerning please return to the emergency department immediately for further medical management.

## 2024-04-13 NOTE — Progress Notes (Unsigned)
 Cardiology Office Note  Date:  04/14/2024   ID:  Claire Washington, DOB January 22, 1925, MRN [id number]  PCP:  Sadie Manna, MD   Chief Complaint  Patient presents with   Follow-up    Pt feeling suffocate some chest pressure, shortness of breath.   HPI:  Claire Washington  is a 89 yr-old woman with history of   hypertension, benign positional vertigo,  osteoarthritis,  hyperlipidemia,  chronic cystitis,  diabetes,  Long smoking history until age 21 Previous TIAs per the patient, CVA 2018 Carotid stenosis <39% b/l in 2019 Chronic  dizziness in her head, gait instability, for years, progressive who presents for routine followup of her dizziness, hypertension, TIA/CVAs  Last seen by myself in clinic 4/25 Lives at the [facility]  In follow-up today she reports having issues with shortness of breath, feels like she is suffocating at times Symptoms started over the past 2 weeks or so and seems to be getting a little bit better Initially seen in urgent care, sent to the ER 04/03/24 Workup negative Also reports a little bit of chest pain, troponin negative  Shortness of breath better with fans Has also set up a humidifier Mucus in chest, using Mucinex  No recent BNP Denies significant lower extremity edema  Periodic chest pain, on the right side, somewhat atypical in presentation  EKG personally reviewed by myself on todays visit EKG Interpretation Date/Time:  Monday April 14 2024 16:18:27 EST Ventricular Rate:  80 PR Interval:  142 QRS Duration:  72 QT Interval:  360 QTC Calculation: 415 R Axis:   -2  Text Interpretation: Normal sinus rhythm Normal ECG When compared with ECG of 03-Apr-2024 14:48, Premature ventricular complexes are no longer Present Confirmed by [name] [phone]) on 04/14/2024 4:53:17 PM   October 2023 in the hospital 5 days in hospital with nausea vomiting diarrhea,  Sigmoid diverticulitis, was very weak Discharged home with PT  past medical  history reviewed ER September 27, 2020 Weakness, given fluids felt better  ER September 29, 2020 for weakness Given fluids, felt better  In the er 09/2020 generalized weakness and dizziness with standing that is been going on for about 4 months, worse in the last few weeks.    Past medical history reviewed Holter monitor reviewed from primary care, this shows predominant normal sinus rhythm, rare episodes of narrow complex tachycardia longest 6 beats, 3 beat run wide-complex  Echo 11/2020   1. Left ventricular ejection fraction, by estimation, is 70 to 75%. The  left ventricle has hyperdynamic function. The left ventricle has no  regional wall motion abnormalities. Left ventricular diastolic parameters  are consistent with Grade II diastolic  dysfunction (pseudonormalization).   2. Right ventricular systolic function is normal. The right ventricular  size is normal.   Previously seen by neurology for chronic dizziness MRI brain without contrast done on 11/30/2017 which showed moderate atrophy and moderate microvascular ischemic changes. --old cerebellar stroke, microvascular ischemic changes, and inner ear issues: worsened.  Echo 08/2016 Normal LV function No significant valve disease No indication from the echo as to cause of dizziness  Event monitor  08/2016 Normal sinus rhythm Rare PVCs, <1% No other significant arrhythmia noted Symptoms of dizziness did not seem to correlate to underlying arrhythmia  Chronic equilibrium and dizziness dating back several years, worse recently Difficult time with equilibrium.  Working with PT   PMH:   has a past medical history of Arthritis, Basal cell carcinoma, Chronic cystitis, Diabetes mellitus type 2, controlled, without complications (  HCC), Disease of thyroid  gland, Diverticulitis, Embolic stroke (HCC), Follicular neoplasm of thyroid , Grade II diastolic dysfunction, Hemorrhage of rectum and anus, Hyperlipidemia, Hyperlipidemia, unspecified,  Hypertension, Leg pain, Osteoarthritis, Pernicious anemia, PVC's (premature ventricular contractions), Small vessel disease, Spinal stenosis, lumbar region without neurogenic claudication, Subdural hematoma (HCC), SVT (supraventricular tachycardia), Syncope and collapse, Thyroid  nodule, Ventricular bigeminy, Vertigo & Chronic Dizziness, and Wears hearing aid in both ears.  PSH:    Past Surgical History:  Procedure Laterality Date   BILATERAL SALPINGOOPHORECTOMY     BREAST CYST EXCISION     ? side, yrs ago   CATARACT EXTRACTION W/PHACO Right 06/20/2023   Procedure: CATARACT EXTRACTION PHACO AND INTRAOCULAR LENS PLACEMENT (IOC) RIGHT 6.23 00:32.8;  Surgeon: Mittie Gaskin, MD;  Location: [facility];  Service: Ophthalmology;  Laterality: Right;   COLONOSCOPY  01/26/2009   Diverticulosis   CYSTOSCOPY     HEMORRHOIDECTOMY BY SIMPLE LIGATION     PR COLONOSCOPY FLX W/ENDOSCOPIC MUCOSAL RESECTION  09/02/2014   Procedure: COLONOSCOPY, FLEXIBLE; WITH ENDOSCOPIC MUCOSAL RESECTION; Surgeon: Chauncey Glendia Pfeiffer, MD; [facility]: [facility]; Service: Gastroenterology   PR COLSC FLX W/RMVL OF TUMOR POLYP LESION SNARE TQ  09/02/2014   Procedure: COLONOSCOPY FLEX; W/REMOV TUMOR/LES BY SNARE; Surgeon: Chauncey Glendia Pfeiffer, MD; [facility]: [facility]; Service: Gastroenterology   SKIN SURGERY     TOTAL ABDOMINAL HYSTERECTOMY W/ BILATERAL SALPINGOOPHORECTOMY  1970s    Current Outpatient Medications  Medication Sig Dispense Refill   acetaminophen  (TYLENOL ) 325 MG tablet Take 2 tablets (650 mg total) by mouth every 6 (six) hours as needed for mild pain, moderate pain, fever or headache (or Fever >/= 101).     amLODipine  (NORVASC ) 2.5 MG tablet TAKE 3 TABLETS (7.5 MG TOTAL) BY MOUTH EVERY EVENING. 270 tablet 3   amLODipine  (NORVASC ) 5 MG tablet Take 1 tablet (5 mg total) by mouth at bedtime. Take both 5 mg pill and 2.5 mg pill at bedtime. (TOTAL DOSE 7.5 MG) 90 tablet 3    clopidogrel  (PLAVIX ) 75 MG tablet Take 1 tablet (75 mg total) by mouth daily. 90 tablet 3   feeding supplement (ENSURE ENLIVE / ENSURE PLUS) LIQD Take 237 mLs by mouth 2 (two) times daily between meals.     hydrALAZINE  (APRESOLINE ) 25 MG tablet TAKE 1 TABLET BY MOUTH 3 TIMES DAILY AS NEEDED FOR SYSTOLIC BLOOD PRESSURE >160 90 tablet 3   magnesium  hydroxide (MILK OF MAGNESIA) 400 MG/5ML suspension Take 5 mLs by mouth daily as needed for mild constipation.     Multiple Vitamins-Minerals (CENTRUM SILVER PO) Take 1 tablet by mouth daily. Reported on 06/11/2015     Multiple Vitamins-Minerals (ICAPS AREDS 2 PO) Take by mouth daily.     polyethylene glycol (MIRALAX  / GLYCOLAX ) 17 g packet Take 17 g by mouth daily as needed. 30 each 0   vitamin B-12 (CYANOCOBALAMIN ) 1000 MCG tablet Take 500 mcg by mouth daily.     Vitamin D , Ergocalciferol , (DRISDOL ) 1.25 MG (50000 UT) CAPS capsule Take 50,000 Units by mouth every 7 (seven) days.     No current facility-administered medications for this visit.    Allergies:   Latex, Hydrocortisone, Sulfa antibiotics, and Tape   Social History:  The patient  reports that she quit smoking about 30 years ago. Her smoking use included cigarettes. She started smoking about 50 years ago. She has a 5 pack-year smoking history. She has never used smokeless tobacco. She reports that she does not currently use alcohol after a past  usage of about 2.0 standard drinks of alcohol per week. She reports that she does not use drugs.   Family History:   family history includes Asthma in her daughter; Colon cancer in her mother; Diabetes in an other family member; Heart attack in her brother and father; Heart disease in her father; Prostate cancer in her father; Stroke in her father.   Review of Systems  Constitutional: Negative.   HENT: Negative.    Respiratory:  Positive for sputum production and shortness of breath.   Cardiovascular: Negative.   Gastrointestinal: Negative.    Musculoskeletal: Negative.        Leg weakness  Neurological: Negative.   Psychiatric/Behavioral: Negative.    All other systems reviewed and are negative.  PHYSICAL EXAM: VS:  BP 130/65 (BP Location: Left Arm, Patient Position: Sitting, Cuff Size: Small)   Pulse 80 Comment: 88 oximeter  Ht 5' (1.524 m)   Wt 116 lb 6.4 oz (52.8 kg)   SpO2 94%   BMI 22.73 kg/m  , BMI Body mass index is 22.73 kg/m. Constitutional:  oriented to person, place, and time. No distress.  HENT:  Head: Grossly normal Eyes:  no discharge. No scleral icterus.  Neck: No JVD, no carotid bruits  Cardiovascular: Regular rate and rhythm, no murmurs appreciated Pulmonary/Chest: Clear to auscultation bilaterally, no wheezes or rails Abdominal: Soft.  no distension.  no tenderness.  Musculoskeletal: Normal range of motion Neurological:  normal muscle tone. Coordination normal. No atrophy Skin: Skin warm and dry Psychiatric: normal affect, pleasant  Recent Labs: 04/03/2024: BUN 15; Creatinine, Ser 0.65; Hemoglobin 16.0; Platelets 244; Potassium 4.4; Sodium 138    Lipid Panel No results found for: CHOL, HDL, LDLCALC, TRIG    Wt Readings from Last 3 Encounters:  04/14/24 116 lb 6.4 oz (52.8 kg)  04/03/24 108 lb 14.5 oz (49.4 kg)  08/13/23 109 lb (49.4 kg)     ASSESSMENT AND PLAN:  Shortness of breath Etiology unclear, unable to exclude mild fluid retention Also reports having mucus, taking Mucinex Recommend she try Lasix 2 times a week as needed for shortness of breath symptoms  Essential hypertension Blood pressure is well controlled on today's visit. No changes made to the medications.  Dizziness Chronic issue, does not appear to be a cardiac issue MRI showing  Strokes, microvascular disease  followed by neurology Chronic gait instability, denies any recent falls  Chronic weakness  uses a walker, has motorized scooter Lives at the [facility] No regular exercise  program  Stroke Multiple embolic strokes on MRI carotid disease on the right followed by Dr. Marea Nonobstructive disease On Plavix ,  Family previously encouraged her to stop her statin  Chest pain Right side, somewhat atypical in nature, will hold off on ischemic workup  Near syncope/syncope Numerous episodes through 2022 Seen in the emergency room, diagnosed with  dehydration Recommend she stay hydrated  Smoking hx Currently a nonsmoker  Type 2 diabetes mellitus with other circulatory complication, without long-term current use of insulin  (HCC) Managed by primary care  Mixed hyperlipidemia Goal LDL less than 70,  Prefers not to be on Lipitor,  Cholesterol above goal   Orders Placed This Encounter  Procedures   EKG 12-Lead     Signed, Velinda Lunger, M.D., Ph.D. 04/14/2024  [facility] [address], [place] [phone]

## 2024-04-14 ENCOUNTER — Ambulatory Visit: Attending: Cardiovascular Disease | Admitting: Cardiovascular Disease

## 2024-04-14 ENCOUNTER — Encounter: Payer: Self-pay | Admitting: Cardiovascular Disease

## 2024-04-14 VITALS — BP 130/65 | HR 80 | Ht 60.0 in | Wt 116.4 lb

## 2024-04-14 DIAGNOSIS — I6523 Occlusion and stenosis of bilateral carotid arteries: Secondary | ICD-10-CM | POA: Diagnosis not present

## 2024-04-14 DIAGNOSIS — R0989 Other specified symptoms and signs involving the circulatory and respiratory systems: Secondary | ICD-10-CM | POA: Insufficient documentation

## 2024-04-14 DIAGNOSIS — I10 Essential (primary) hypertension: Secondary | ICD-10-CM | POA: Diagnosis not present

## 2024-04-14 DIAGNOSIS — I739 Peripheral vascular disease, unspecified: Secondary | ICD-10-CM | POA: Insufficient documentation

## 2024-04-14 DIAGNOSIS — E1159 Type 2 diabetes mellitus with other circulatory complications: Secondary | ICD-10-CM | POA: Insufficient documentation

## 2024-04-14 DIAGNOSIS — E785 Hyperlipidemia, unspecified: Secondary | ICD-10-CM | POA: Insufficient documentation

## 2024-04-14 DIAGNOSIS — R0600 Dyspnea, unspecified: Secondary | ICD-10-CM | POA: Diagnosis not present

## 2024-04-14 DIAGNOSIS — I340 Nonrheumatic mitral (valve) insufficiency: Secondary | ICD-10-CM | POA: Insufficient documentation

## 2024-04-14 DIAGNOSIS — R55 Syncope and collapse: Secondary | ICD-10-CM | POA: Insufficient documentation

## 2024-04-14 MED ORDER — FUROSEMIDE 20 MG PO TABS: ORAL_TABLET | ORAL | 3 refills | Status: AC

## 2024-04-14 NOTE — Patient Instructions (Addendum)
 Medication Instructions:  Lasix 20 mg daily as needed for shortness of breath Up to twice a week  If you need a refill on your cardiac medications before your next appointment, please call your pharmacy.   Lab work: No new labs needed  Testing/Procedures: No new testing needed  Follow-Up: At [facility], you and your health needs are our priority.  As part of our continuing mission to provide you with exceptional heart care, we have created designated Provider Care Teams.  These Care Teams include your primary Cardiologist (physician) and Advanced Practice Providers (APPs -  Physician Assistants and Nurse Practitioners) who all work together to provide you with the care you need, when you need it.  You will need a follow up appointment in 6 months, APP ok  Providers on your designated Care Team:   Lonni Meager, NP Bernardino Bring, PA-C Cadence [place], [place]  COVID-19 Vaccine Information can be found at: [email or web address] For questions related to vaccine distribution or appointments, please email [email or web address] or call [phone].

## 2024-04-16 ENCOUNTER — Telehealth: Payer: Self-pay | Admitting: Cardiovascular Disease

## 2024-04-16 MED ORDER — AMLODIPINE BESYLATE 2.5 MG PO TABS: 7.5000 mg | ORAL_TABLET | Freq: Every day | ORAL | 3 refills | Status: DC

## 2024-04-16 NOTE — Telephone Encounter (Signed)
 Refills has been sent to the pharmacy.

## 2024-04-16 NOTE — Telephone Encounter (Signed)
" °*  STAT* If patient is at the pharmacy, call can be transferred to refill team.   1. Which medications need to be refilled? (please list name of each medication and dose if known)  amLODipine  (NORVASC ) 5 MG tablet   2. Would you like to learn more about the convenience, safety, & potential cost savings by using the [facility]? no   3. Are you open to using the [facility] (Type [facility]. no   4. Which pharmacy/location (including street and city if local pharmacy) is medication to be sent to? [id number] [address], [place] [phone]    5. Do they need a 30 day or 90 day supply? 90 day   Pt is out.   "

## 2024-04-21 MED ORDER — AMLODIPINE BESYLATE 2.5 MG PO TABS: 2.5000 mg | ORAL_TABLET | Freq: Every evening | ORAL | 3 refills | Status: AC

## 2024-04-21 MED ORDER — AMLODIPINE BESYLATE 5 MG PO TABS: 5.0000 mg | ORAL_TABLET | Freq: Every day | ORAL | 3 refills | Status: AC

## 2024-04-21 NOTE — Addendum Note (Signed)
 Addended by: DRENA MARTINIS, Collyn Selk L on: 04/21/2024 02:45 PM   Modules accepted: Orders

## 2024-04-21 NOTE — Telephone Encounter (Signed)
 Called patient back about message. Sent in amlodipine  2.5 mg and 5 mg pills to equal 7.5 mg. Patient wants to take it this way, and she has no desire to take it any other way.

## 2024-05-02 NOTE — ED Notes (Signed)
 ISAR Screening- for all patients 75+ community-dwelling patients  Before the illness or injury that brought you to the Emergency Department, did you need someone to help you regulary?yes  Since the illness or injury that brought you to the Emergency Department, have you needed more help than usual to take care of yourself?yes Have you been hospitalized for one or more nights during the past six months (excluding a stay in the Emergency Department)?no In general, do you see well?no In general, do you have serious problems with your memory? no Do you take more than five different prescribed medications every day? no  A score of 3 or more will require a Case Management consultation. Paged [phone]. Primary RN notified of score of 3 or more and CM paged for assessment.

## 2024-05-02 NOTE — ED Provider Notes (Signed)
 [facility] Emergency Department Provider Note   History, MDM, ED Course   History: Claire Washington is a 89 y.o. female with a past medical history of prior CVA (on Plavix ), carotid stenosis, HTN, T2DM, HLD, and anxiety presenting for evaluation of generalized fatigue. The patient reports having an acute onset of emesis this morning followed by significant generalized weakness. She notes her emesis onset acutely after eating breakfast, without preceding nausea, and notes she was unable to walk secondary to her weakness. The patient ambulates with a walker at baseline; she does note she has a wheelchair at home to use as needed, but reports she has not needed to use this recently until her weakness onset today. This is in the setting of 3 weeks of URI-like symptoms; she notes she was prescribed Augmentin  and a course of steroids, which provided some relief of her congestion but minimal relief overall. Upon interview, she reports a history of diverticulitis; she is unsure if her current symptoms feel similar. She notes she had minimal bowel movements over the past several days but is able to pas gas. Currently, she endorses some nausea and decreased appetite. Denies abdominal pain or syncope.   Pertinent exam: Elderly but well appearing. Heart rate and rhythm is regular. Lungs are clear to auscultation bilaterally. Abdomen with LLQ tenderness; otherwise soft. Dry mucous membranes. No LE edema. Extremities warm and well perfused.   Impression/Ddx: 89 year old female with history and physical examination as above.  Differential diagnosis includes viral syndrome, dehydration, electrolyte derangement, deconditioning, diverticulitis/diverticulosis, hepatobiliary disease, endocrine derangement/metabolic derangement amongst many other etiologies.  Will plan to obtain laboratory evaluation/blood work as well as CT imaging.  Will administer IV fluids.  Diagnostic work-up as below: Orders Placed This  Encounter  Procedures   Respiratory Pathogen Panel with COVID (Nasopharyngeal)   XR Chest 2 views   CT head WO contrast   CT Abdomen Pelvis W IV Contrast Only   CBC w/ Differential   Comprehensive Metabolic Panel   Urinalysis with Microscopy (Clean Catch)   TSH   hsTroponin I (single, no delta)   Ambulatory referral to Home Health   Inpatient consult to Case Management   ECG 12 Lead   Insert peripheral IV   ED Admit Decision    Progress Notes: ED Course as of 05/04/24 0240  Fri May 02, 2024  2008 EKG reveals normal sinus rhythm with a rate of 80.  Normal axis.  Normal intervals.  There are some baseline artifact that limits interpretation but within limitation there is no obvious ST elevation or acute ischemic changes.  2028 Leukocyte Esterase, UA(!): Moderate  2028 WBC, UA(!): 9  2028 Moderate leukocyte esterase and very mild pyuria.  This has been seen prior on urinalyses.  There is no bacteria seen and negative nitrites I have a low suspicion that this represents a true infection but will send for culture.  2030 hsTroponin I: 6  2030 TSH: 1.875  2159 CT Abdomen Pelvis W IV Contrast Only IMPRESSION: No acute abnormalities within the abdomen or pelvis. No evidence of diverticulitis as clinically questioned.   2345 Inpatient team came down and spoke with patient and daughter at bedside.  After further discussion they would prefer to go home with home health.  Unfortunately no case manager is here tonight so we will need to wait until the morning.  Plan of care was discussed with the bedside and they are amenable for waiting until the morning.  I will order the patient's home  medications as well as Mucinex which she request.  0100: Signed out to oncoming provider at this time pending case management for home health in the morning. __________________________________________________________________  The case was discussed with the attending physician who is in agreement  with the above assessment and plan.  Additional Medical Decision Making   - Any discussion of this patient's case/presentation between myself and consultants, admitting teams, or other team members has been documented above. - Imaging and other studies, if performed, that were available during my care of the patient were independently reviewed and interpreted by me and considered in my medical decision making as documented above. - External records reviewed: 04/16/24 [facility] Note for past medical history - Consideration of admission, observation, transfer, or escalation of care: Admission was considered and discussed with the patient and family at bedside.  They elect to go home with home health.  History   Chief Complaint:  Chief Complaint  Patient presents with   Fatigue    History of Present Illness:  As documented above.  Additional history during this encounter provided by: daughter at bedside.   Past Medical History: Past Medical History[1]  Medications:  No current facility-administered medications for this encounter.  Current Outpatient Medications:    amLODIPine  (NORVASC ) 10 MG tablet, Take 10 mg by mouth daily., Disp: , Rfl:    aspirin (ECOTRIN) 81 MG tablet, Take 81 mg by mouth daily., Disp: , Rfl:    ergocalciferol  (DRISDOL ) 50,000 unit capsule, Take 50,000 Units by mouth once a week., Disp: , Rfl:    ezetimibe  (ZETIA ) 10 mg tablet, Take 10 mg by mouth daily., Disp: , Rfl:    Lactobacillus rhamnosus GG (CULTURELLE) 10 billion cell capsule, Take 1 capsule by mouth daily., Disp: , Rfl:    losartan  (COZAAR ) 100 MG tablet, Take 100 mg by mouth daily., Disp: , Rfl:    metoprolol  succinate (TOPROL -XL) 50 MG 24 hr tablet, Take 50 mg by mouth daily., Disp: , Rfl:    multivit-iron-min-folic acid  3,500-18-0.4 unit-mg-mg Chew, Chew., Disp: , Rfl:  Discharge Medication List as of 05/03/2024 10:44 AM     CONTINUE these medications which have NOT CHANGED    Details  amLODIPine  (NORVASC ) 10 MG tablet Take 10 mg by mouth daily., Until Discontinued, Historical Med    aspirin (ECOTRIN) 81 MG tablet Take 81 mg by mouth daily., Until Discontinued, Historical Med    ergocalciferol  (DRISDOL ) 50,000 unit capsule Take 50,000 Units by mouth once a week., Until Discontinued, Historical Med    ezetimibe  (ZETIA ) 10 mg tablet Take 10 mg by mouth daily., Until Discontinued, Historical Med    Lactobacillus rhamnosus GG (CULTURELLE) 10 billion cell capsule Take 1 capsule by mouth daily., Until Discontinued, Historical Med    losartan  (COZAAR ) 100 MG tablet Take 100 mg by mouth daily., Until Discontinued, Historical Med    metoprolol  succinate (TOPROL -XL) 50 MG 24 hr tablet Take 50 mg by mouth daily., Until Discontinued, Historical Med    multivit-iron-min-folic acid  3,500-18-0.4 unit-mg-mg Chew Chew., Until Discontinued, Historical Med        Allergies:  Sulfa (sulfonamide antibiotics)  Past Surgical History:  Past Surgical History[2]  Social History:  Social History   Tobacco Use   Smoking status: Former    Current packs/day: 0.25    Average packs/day: 0.3 packs/day for 20.0 years (5.0 ttl pk-yrs)    Types: Cigarettes   Smokeless tobacco: Not on file  Substance Use Topics   Alcohol use: Yes    Alcohol/week:  2.0 standard drinks of alcohol    Types: 1 Glasses of wine, 1 Shots of liquor per week    Family History: Family History[3]   Physical Exam   Vital Signs:   BP (!) 158/55   Pulse 72   Temp 36.5 C (97.7 F) (Oral)   Resp 17   Wt 57.2 kg (126 lb)   SpO2 93%   BMI 23.82 kg/m   General: Alert and oriented. Well-appearing and in no distress. Skin: Skin is warm and well-perfused. No rashes noted. HEENT: Normocephalic and atraumatic, dry mucous membranes, no nasal drainage, no intra-oral lesions or erythema. Lungs: Normal respiratory effort. Breath sounds clear bilaterally without wheezes, crackles, or rhonchi. Heart: Rate as  above, regular rhythm. No murmurs appreciated. Abdomen: LLQ tenderness; otherwise soft Extremities: No peripheral edema or clubbing. Pulses are normal and symmetric in upper and lower extremities. Extremities warm and well perfused.  Neurological: Normal speech and language. No gross focal neurologic deficits are appreciated. Psychiatric: Mood and affect are normal. Normal speech and behavior.   Radiology   CT head WO contrast  Final Result  No acute intracranial abnormality.        CT Abdomen Pelvis W IV Contrast Only  Final Result  No acute abnormalities within the abdomen or pelvis. No evidence of diverticulitis as clinically questioned.      XR Chest 2 views  Final Result    Chronic changes with no acute process.            Labs   Labs Reviewed  COMPREHENSIVE METABOLIC PANEL - Abnormal; Notable for the following components:      Result Value   BUN 8 (*)    All other components within normal limits  URINALYSIS WITH MICROSCOPY - Abnormal; Notable for the following components:   Leukocyte Esterase, UA Moderate (*)    Protein, UA 200 mg/dL (*)    Ketones, UA Trace (*)    WBC, UA 9 (*)    Mucus, UA Rare (*)    All other components within normal limits  CBC W/ AUTO DIFF - Abnormal; Notable for the following components:   RBC 5.58 (*)    HGB 16.1 (*)    HCT 46.5 (*)    Absolute Neutrophils 8.2 (*)    Absolute Lymphocytes 0.6 (*)    All other components within normal limits  RESPIRATORY PATHOGEN PANEL - Normal   Narrative:    This result was obtained using the FDA-cleared [employer] Respiratory Panel 2.1 (RP2.1). Performance characteristics have been verified by the [facility]. A negative result does not rule out the possibility of infection and should be used in conjunction with other clinical findings as well as patient history. Positive results do not rule out co-infection with organisms not included in the RP2.1 panel. This assay does  not distinguish between rhinovirus and enterovirus. Cross-reactivity may occur between B. pertussis and non-pertussis Bordetella species.  TSH - Normal  HIGH SENSITIVITY TROPONIN I - SINGLE - Normal  CBC W/ DIFFERENTIAL   Narrative:    The following orders were created for panel order CBC w/ Differential.                 Procedure                               Abnormality         Status                                    ---------                               -----------         ------  CBC w/ [id number]]         Abnormal            Final result                                               Please view results for these tests on the individual orders.   _____________________________________________________________________  Please note - This documentation was generated using dictation and/or voice recognition software, and as such, may contain spelling or other transcription errors. Any questions regarding the content of this documentation should be directed to the individual who electronically signed.  Documentation assistance was provided by Burton Picking, Scribe, on May 02, 2024 at 8:13 PM for Donnice Found, MD.   Documentation assistance was provided by the scribe in my presence.  The documentation recorded by the scribe has been reviewed by me and accurately reflects the services I personally performed.        [1] Past Medical History: Diagnosis Date   Arthritis    Disease of thyroid  gland    Hypertension   [2] Past Surgical History: Procedure Laterality Date   HYSTERECTOMY  1970   complete   PR COLONOSCOPY FLX W/ENDOSCOPIC MUCOSAL RESECTION  09/02/2014   Procedure: COLONOSCOPY, FLEXIBLE; WITH ENDOSCOPIC MUCOSAL RESECTION;  Surgeon: Chauncey Glendia Pfeiffer, MD;  [facility];  Service: Gastroenterology   PR COLSC FLX W/RMVL OF TUMOR POLYP LESION SNARE TQ N/A 09/02/2014   Procedure: COLONOSCOPY FLEX;  W/REMOV TUMOR/LES BY SNARE;  Surgeon: Chauncey Glendia Pfeiffer, MD;  [facility];  Service: Gastroenterology  [3] Family History Problem Relation Age of Onset   Cancer Mother    Anesthesia problems Neg Hx    Found Donnice SAUNDERS, MD Resident 05/04/24 [phone]

## 2024-05-02 NOTE — ED Triage Notes (Signed)
 URI x 3 weeks, still not better after abx and steroids. Was throwing up and felt disoriented.

## 2024-05-03 NOTE — ED Provider Notes (Signed)
 ED Progress Note  Received sign out from previous provider.  Patient Summary: Claire Washington is a 89 y.o. female with a past medical history of prior CVA (on Plavix ), carotid stenosis, HTN, T2DM, HLD, and anxiety presenting for evaluation of generalized fatigue as described in same-day ED provider note. Action List:  CM in AM for home health  Updates ED Course as of 05/03/24 0717  Sat May 03, 2024  0109 Progressive fatigue, weakness w URI symptoms. Home health, case management in AM  [phone] Patient signed out to oncoming provider pending case management for home health

## 2024-05-03 NOTE — Care Plan (Signed)
 [facility] at [phone].  Spoke w/ Darice. Referral accepted for home health [facility] RN, PT, & OT. Home health referral and referral documentation faxed to [facility](Fax#[phone]) via EPIC. [name] May 03, 2024 10:15 AM

## 2024-05-03 NOTE — Consults (Signed)
 "      Care Management Initial Transition Planning Assessment            General Care Manager / Social Worker assessed the patient by : In person interview with patient, Medical record review, Discussion with Clinical Care team, Telephone conversation with family Orientation Level: Oriented X4 Functional level prior to admission: Independent Reason for referral: Home Health, Discharge Planning  Contact/Decision Maker Extended Emergency Contact Information Primary Emergency Contact: MacFall,Janet  [place] Home Phone: [phone] Relation: Daughter  Legal Next of Kin / Guardian / POA / Advance Directives    Advance Directive (Medical Treatment) Does patient have an advance directive covering medical treatment?: Patient does not have advance directive covering medical treatment. Reason patient does not have an advance directive covering medical treatment:: Patient needs follow-up to complete one.  Health Care Decision Maker [HCDM] (Medical & Mental Health Treatment) Healthcare Decision Maker: Patient does not wish to appoint a Health Care Decision Maker at this time Information offered on HCDM, Medical & Mental Health advance directives:: Patient declined information.  Advance Directive (Mental Health Treatment) Does patient have an advance directive covering mental health treatment?: Patient does not have advance directive covering mental health treatment. Reason patient does not have an advance directive covering mental health treatment:: Patient does not wish to complete one at this time.  Readmission Information  Have you been hospitalized in the last 30 days?: No  Patient Information Lives with: Alone  Type of Residence: Private residence     Location/Detail: [address] [place] [address]  Home Phone:  [phone]  Support Systems/Concerns: Children, Family Members  Responsibilities/Dependents at home?: No  Home Care services in place prior to  admission?: No      Outpatient/Community Resources in place prior to admission: Clinic Agency detail (Name/Phone #): PCP [employer]  Equipment Currently Used at Home: commode chair, grab bar, toilet, grab bar, tub/shower, hospital bed, shower chair, walker, rolling, wheelchair, manual, wheelchair, power    Currently receiving outpatient dialysis?: No    Financial Information    Need for financial assistance?: No    Social Drivers of Health Social Drivers of Health were addressed in provider documentation.  Please refer to patient history. Social Drivers of Health   Food Insecurity: No Food Insecurity (05/03/2024)   Hunger Vital Sign    Worried About Running Out of Food in the Last Year: Never true    Ran Out of Food in the Last Year: Never true  Tobacco Use: Medium Risk (05/02/2024)   Received from [facility]   Patient History    Smoking Tobacco Use: Former    Smokeless Tobacco Use: Never    Passive Exposure: Past  Transportation Needs: No Transportation Needs (05/03/2024)   PRAPARE - Administrator, Civil Service (Medical): No    Lack of Transportation (Non-Medical): No  Alcohol Use: Not At Risk (04/03/2024)   Received from [facility]   AUDIT-C    Q1: How often do you have a drink containing alcohol?: Never    Q2: How many drinks containing alcohol do you have on a typical day when you are drinking?: Patient does not drink    Q3: How often do you have six or more drinks on one occasion?: Never  Housing: Low Risk (05/03/2024)   Housing    Within the past 12 months, have you ever stayed: outside, in a car, in a tent, in an overnight shelter, or temporarily  in someone else's home (i.e. couch-surfing)?: No    Are you worried about losing your housing?: No  Physical Activity: Not on file  Utilities: Not At Risk (10/04/2023)   Received from [facility] Utilities    In the past 12  months has the electric, gas, oil, or water company threatened to shut off services in your home?: No  Stress: Not on file  Interpersonal Safety: Not At Risk (05/02/2024)   Interpersonal Safety    Unsafe Where You Currently Live: No    Physically Hurt by Anyone: No    Abused by Anyone: No  Substance Use: Not on file (02/25/2023)  Intimate Partner Violence: Not At Risk (12/26/2022)   Received from [facility]   Humiliation, Afraid, Rape, and Kick questionnaire    Within the last year, have you been afraid of your partner or ex-partner?: No    Within the last year, have you been humiliated or emotionally abused in other ways by your partner or ex-partner?: No    Within the last year, have you been kicked, hit, slapped, or otherwise physically hurt by your partner or ex-partner?: No    Within the last year, have you been raped or forced to have any kind of sexual activity by your partner or ex-partner?: No  Social Connections: Not on file  Financial Resource Strain: Low Risk (05/03/2024)   Overall Financial Resource Strain (CARDIA)    Difficulty of Paying Living Expenses: Not hard at all  Health Literacy: Not on file  Internet Connectivity: Not on file    Complex Discharge Information  Is patient identified as a difficult/complex discharge?: No   Discharge Needs Assessment Concerns to be Addressed: decision-making, discharge planning, medication, home safety  Clinical Risk Factors: > 65, New Diagnosis, Lives Alone or Absence of Caregiver to Assist with Discharge and Home Care, Visual or Hearing Impaired, Multiple Diagnoses (Chronic), Functional Limitations  Barriers to taking medications: No  Prior overnight hospital stay or ED visit in last 90 days: No        Anticipated Changes Related to Illness: none  Equipment Needed After Discharge: none  Discharge Facility/Level of Care Needs: other (see comments) (home health needed)  Readmission Risk of Unplanned Readmission  Score:  % Predictive Model Details  No score data available for [facility] Risk of Unplanned Readmission   Readmitted Within the Last 30 Days? (No if blank)  Patient at risk for readmission?: Yes  Discharge Plan Screen findings are: Discharge planning needs identified or anticipated (Comment). (pt agreeable and prefers home health)  Expected Discharge Date:   Expected Transfer from Critical Care:    Quality data for continuing care services shared with patient and/or representative?: Yes Patient and/or family were provided with choice of facilities / services that are available and appropriate to meet post hospital care needs?: Yes  List choices in order highest to lowest preferred, if applicable. : would like to use same agency she used before; confirmed with dtr and requested [facility]  Initial Assessment complete?: Yes    "

## 2024-05-03 NOTE — Care Plan (Signed)
 [employer]  [facility] Care Referral Clinical Care Management phone [phone] fax [phone]  Demographics Patient Name: Claire Washington Date of Birth: 1925/01/23 Gender: Female [facility] Medical Record #: [id number] Billing Address: [address] [place] [address] Destination Address: Mailing Address:  [address] [place] [address]  Anticipated Discharge Date:05/03/24 Medicare homebound criteria met? Yes Known concerns about discharge environment? No Right to Choice document & provider list(s) provided and discussed with patient/family? Yes Teachable caregiver identified: Caregiver: Extended Emergency Contact Information Primary Emergency Contact: MacFall,Janet  [place] Home Phone: [phone] Relation: Daughter Additional Information: N/A Language Barrier (if yes, identify language): No  This Document is a Referral Only Additional information, including specific home care orders and instructions, will be provided in the electronic discharge summary.   Clinical Condition []  See attached medical records Admitting Diagnosis:  Generalized weakness [R53.1] Past Medical History:   has a past medical history of Arthritis, Disease of thyroid  gland, and Hypertension. Past Surgical History:   has a past surgical history that includes Hysterectomy (1970); pr colonoscopy flx w/endoscopic mucosal resection (09/02/2014); and pr colsc flx w/rmvl of tumor polyp lesion snare tq (N/A, 09/02/2014).   Name of MD signing discharge orders and contact info: Toribio JINNY Desanctis Primary Care Physician: Sadie Manna, MD  Services Requested [x]  New Referral []  Resumption of Care  [x]  This is patient at risk for readmission  Skilled Nursing:  Medication management and education And general assessments Physical Therapy: Please evaluate and treat. NOTE: Weightbearing: full weightbearing Occupational Therapy: Please evaluate and treat  Additional  Order Information:   N/A  Requested Start of Care [facility]):  Within 48H  CCM Case Manager to electronically sign referral when complete.  Katherine O Davis

## 2024-05-03 NOTE — Care Plan (Signed)
 Right to Choose Post-Acute Care Service Providers [facility] Care Management  Patient Patient Name: Claire Washington  Date of Birth: 09-27-1924 [facility] Medical Record #: [id number]   Right to Choice [facility] is pleased to provide you with the attached list of post-acute care service providers who are licensed and available to provide the services you need at the zip code of your discharge destination. This list of providers was extracted from the Centers for Medicare and Medicaid Services on-line list of certified agencies or the in-network list supplied by your commercial insurer. You have the freedom to choose the provider which will provide post-discharge services to you.  When possible, [facility] will respect your goals of care, treatment preferences, and other preferences you express. [facility] does not endorse or guarantee the quality of services provided by the agencies listed. [facility] will make reasonable attempts to arrange services with the providers you identify as top choices, however we cannot guarantee service availability from any agency. If services from your choice of provider(s) are not available within timeframes required for your care, we will contact other providers on the attached list to make arrangements.    Financial Disclosure [facility] is a department of [facility].     For Questions or Concerns If you have any questions about your right to choose or the agencies on the list provided, please contact the person listed below, or the Care Management Department at [phone].

## 2024-05-05 ENCOUNTER — Other Ambulatory Visit: Payer: Self-pay

## 2024-05-05 ENCOUNTER — Emergency Department
Admission: EM | Admit: 2024-05-05 | Discharge: 2024-05-05 | Disposition: A | Discharge status: Home / Self Care | Attending: Emergency Medicine | Admitting: Emergency Medicine

## 2024-05-05 ENCOUNTER — Encounter: Payer: Self-pay | Admitting: *Deleted

## 2024-05-05 ENCOUNTER — Emergency Department

## 2024-05-05 DIAGNOSIS — Z7902 Long term (current) use of antithrombotics/antiplatelets: Secondary | ICD-10-CM | POA: Diagnosis not present

## 2024-05-05 DIAGNOSIS — E785 Hyperlipidemia, unspecified: Secondary | ICD-10-CM | POA: Diagnosis not present

## 2024-05-05 DIAGNOSIS — R531 Weakness: Secondary | ICD-10-CM | POA: Diagnosis not present

## 2024-05-05 DIAGNOSIS — E119 Type 2 diabetes mellitus without complications: Secondary | ICD-10-CM | POA: Insufficient documentation

## 2024-05-05 DIAGNOSIS — R479 Unspecified speech disturbances: Secondary | ICD-10-CM | POA: Insufficient documentation

## 2024-05-05 DIAGNOSIS — Z8673 Personal history of transient ischemic attack (TIA), and cerebral infarction without residual deficits: Secondary | ICD-10-CM | POA: Diagnosis not present

## 2024-05-05 DIAGNOSIS — I10 Essential (primary) hypertension: Secondary | ICD-10-CM | POA: Insufficient documentation

## 2024-05-05 LAB — COMPREHENSIVE METABOLIC PANEL WITH GFR
ALT: 13 U/L (ref 0–44)
AST: 16 U/L (ref 15–41)
Albumin: 4 g/dL (ref 3.5–5.0)
Alkaline Phosphatase: 66 U/L (ref 38–126)
Anion gap: 11 (ref 5–15)
BUN: 10 mg/dL (ref 8–23)
CO2: 26 mmol/L (ref 22–32)
Calcium: 9.2 mg/dL (ref 8.9–10.3)
Chloride: 99 mmol/L (ref 98–111)
Creatinine, Ser: 0.57 mg/dL (ref 0.44–1.00)
GFR, Estimated: >60 mL/min
Glucose, Bld: 140 mg/dL — ABNORMAL HIGH (ref 70–99)
Potassium: 4.6 mmol/L (ref 3.5–5.1)
Sodium: 136 mmol/L (ref 135–145)
Total Bilirubin: 0.3 mg/dL (ref 0.0–1.2)
Total Protein: 6.8 g/dL (ref 6.5–8.1)

## 2024-05-05 LAB — CBC
HCT: 49.9 % — ABNORMAL HIGH (ref 36.0–46.0)
Hemoglobin: 16.8 g/dL — ABNORMAL HIGH (ref 12.0–15.0)
MCH: 28.4 pg (ref 26.0–34.0)
MCHC: 33.7 g/dL (ref 30.0–36.0)
MCV: 84.3 fL (ref 80.0–100.0)
Platelets: 248 K/uL (ref 150–400)
RBC: 5.92 MIL/uL — ABNORMAL HIGH (ref 3.87–5.11)
RDW: 14.5 % (ref 11.5–15.5)
WBC: 8 K/uL (ref 4.0–10.5)
nRBC: 0 % (ref 0.0–0.2)

## 2024-05-05 LAB — URINALYSIS, ROUTINE W REFLEX MICROSCOPIC
Bilirubin Urine: NEGATIVE
Glucose, UA: NEGATIVE mg/dL
Hgb urine dipstick: NEGATIVE
Ketones, ur: NEGATIVE mg/dL
Leukocytes,Ua: NEGATIVE
Nitrite: NEGATIVE
Protein, ur: NEGATIVE mg/dL
Specific Gravity, Urine: 1.003 — ABNORMAL LOW (ref 1.005–1.030)
pH: 8 (ref 5.0–8.0)

## 2024-05-05 LAB — DIFFERENTIAL
Abs Immature Granulocytes: 0.04 K/uL (ref 0.00–0.07)
Basophils Absolute: 0.1 K/uL (ref 0.0–0.1)
Basophils Relative: 1 %
Eosinophils Absolute: 0.1 K/uL (ref 0.0–0.5)
Eosinophils Relative: 1 %
Immature Granulocytes: 1 %
Lymphocytes Relative: 15 %
Lymphs Abs: 1.2 K/uL (ref 0.7–4.0)
Monocytes Absolute: 0.6 K/uL (ref 0.1–1.0)
Monocytes Relative: 7 %
Neutro Abs: 6 K/uL (ref 1.7–7.7)
Neutrophils Relative %: 75 %

## 2024-05-05 LAB — PROTIME-INR
INR: 0.9 (ref 0.8–1.2)
Prothrombin Time: 12.8 s (ref 11.4–15.2)

## 2024-05-05 LAB — APTT: aPTT: 35 s (ref 24–36)

## 2024-05-05 MED ORDER — LORAZEPAM 0.5 MG PO TABS
0.5000 mg | ORAL_TABLET | Freq: Once | ORAL | Status: AC
  Administered 2024-05-05: 0.5 mg via ORAL
  Filled 2024-05-05: qty 1

## 2024-05-05 MED ORDER — LORAZEPAM 2 MG/ML IJ SOLN
0.5000 mg | Freq: Once | INTRAMUSCULAR | Status: AC
  Administered 2024-05-05: 0.5 mg via INTRAVENOUS
  Filled 2024-05-05: qty 1

## 2024-05-05 MED ORDER — ACETAMINOPHEN 500 MG PO TABS
1000.0000 mg | ORAL_TABLET | Freq: Once | ORAL | Status: AC
  Administered 2024-05-05: 1000 mg via ORAL
  Filled 2024-05-05: qty 2

## 2024-05-05 MED ORDER — LACTATED RINGERS IV BOLUS
1000.0000 mL | Freq: Once | INTRAVENOUS | Status: AC
  Administered 2024-05-05: 1000 mL via INTRAVENOUS

## 2024-05-05 NOTE — ED Triage Notes (Addendum)
 Pt brought over from [facility] with c/o slurred speech that started on Friday. Pt went to ED in [place]. Pt states she woke up this morning and was fatigued and weak. Pt states she already had the slurred speech since Friday.  [facility] stated pt had slurred speech with them while at appt.

## 2024-05-05 NOTE — ED Provider Notes (Signed)
 "  [facility] Provider Note    Event Date/Time   First MD Initiated Contact with Patient 05/05/24 1621     (approximate)   History   Weakness   HPI  Claire Washington is a 89 y.o. female who presents to the ED for evaluation of Weakness   I reviewed an outside ED visit from 3 days ago where patient was seen for similar presentation of generalized weakness and fatigue.  Has a history of stroke on Plavix , HTN, HLD, DM and anxiety.  Reassuring workup including CT abdomen pelvis, CT head, blood work, UA without acute features.  Inpatient medical team evaluated the patient for admission, patient and daughter elected to go home.  Patient presents to the ED with chronic intermittent weakness and slurred speech.  Patient reports that has been going on intermittently for at least a month, daughter at the bedside reports that has been years of some of these episodes.  She saw her PCP today who urged her to come to the ER for another evaluation, possible IV fluids   Physical Exam   Triage Vital Signs: ED Triage Vitals  Encounter Vitals Group     BP 05/05/24 1520 (!) 157/74     Girls Systolic BP Percentile --      Girls Diastolic BP Percentile --      Boys Systolic BP Percentile --      Boys Diastolic BP Percentile --      Pulse Rate 05/05/24 1520 95     Resp 05/05/24 1520 18     Temp 05/05/24 1520 97.8 F (36.6 C)     Temp Source 05/05/24 1520 Oral     SpO2 05/05/24 1520 95 %     Weight 05/05/24 1521 114 lb 10.2 oz (52 kg)     Height 05/05/24 1521 5' (1.524 m)     Head Circumference --      Peak Flow --      Pain Score 05/05/24 1520 5     Pain Loc --      Pain Education --      Exclude from Growth Chart --     Most recent vital signs: Vitals:   05/05/24 1520 05/05/24 1946  BP: (!) 157/74 (!) 151/58  Pulse: 95 72  Resp: 18 20  Temp: 97.8 F (36.6 C) (!) 97.5 F (36.4 C)  SpO2: 95% 99%    General: Awake, no distress.  Fluent speech, pleasant  and conversational CV:  Good peripheral perfusion.  Resp:  Normal effort.  Abd:  No distention.  Soft and nontender MSK:  No deformity noted.  Neuro:  No focal deficits appreciated. Cranial nerves II through XII intact 5/5 strength and sensation in all 4 extremities Other:     ED Results / Procedures / Treatments   Labs (all labs ordered are listed, but only abnormal results are displayed) Labs Reviewed  CBC - Abnormal; Notable for the following components:      Result Value   RBC 5.92 (*)    Hemoglobin 16.8 (*)    HCT 49.9 (*)    All other components within normal limits  COMPREHENSIVE METABOLIC PANEL WITH GFR - Abnormal; Notable for the following components:   Glucose, Bld 140 (*)    All other components within normal limits  URINALYSIS, ROUTINE W REFLEX MICROSCOPIC - Abnormal; Notable for the following components:   Color, Urine STRAW (*)    APPearance CLEAR (*)    Specific Gravity, Urine 1.003 (*)  All other components within normal limits  PROTIME-INR  APTT  DIFFERENTIAL  CBG MONITORING, ED    EKG Sinus rhythm with a rate of 95 bpm, couple PVCs.  Wandering baseline clouds fine detail but no clear signs of acute ischemia or high-grade interval changes.  RADIOLOGY CT head interpreted by me without evidence of acute intracranial pathology  Official radiology report(s): MR BRAIN WO CONTRAST Result Date: 05/05/2024 EXAM: MRI BRAIN WITHOUT CONTRAST 05/05/2024 06:02:21 PM TECHNIQUE: Multiplanar multisequence MRI of the head/brain was performed without the administration of intravenous contrast. COMPARISON: CT head dated 05/05/2024. CLINICAL HISTORY: acute ambulatory dysfunction, dysarthria. eval cva. FINDINGS: BRAIN AND VENTRICLES: No acute infarct. No intracranial hemorrhage. No mass. No midline shift. No hydrocephalus. The sella is unremarkable. Normal flow voids. Scattered and confluent T2 and FLAIR hyperintensity in the periventricular and subcortical white matter  suggestive of moderate to severe chronic microvascular ischemic changes. There is mild age related volume loss. Chronic microhemorrhage in the right occipital lobe. Small remote infarcts in the right cerebellum. ORBITS: Right lens replacement. SINUSES AND MASTOIDS: No significant abnormality. BONES AND SOFT TISSUES: Normal marrow signal. No soft tissue abnormality. IMPRESSION: 1. No acute intracranial abnormality. 2. Moderate to severe chronic microvascular ischemic changes. 3. Small remote infarcts in the right cerebellum. 4. Chronic microhemorrhage in the right occipital lobe. Electronically signed by: Donnice Mania MD 05/05/2024 06:59 PM EST RP Workstation: [id number]   CT HEAD WO CONTRAST Result Date: 05/05/2024 EXAM: CT HEAD WITHOUT CONTRAST 05/05/2024 03:53:59 PM TECHNIQUE: CT of the head was performed without the administration of intravenous contrast. Automated exposure control, iterative reconstruction, and/or weight based adjustment of the mA/kV was utilized to reduce the radiation dose to as low as reasonably achievable. COMPARISON: Head CT 07/02/2020 and MRI 02/22/2023. CLINICAL HISTORY: Mental status change, unknown cause; slurred speech weakness. FINDINGS: BRAIN AND VENTRICLES: There is no evidence of an acute infarct, intracranial hemorrhage, mass, midline shift, hydrocephalus, or extra-axial fluid collection. Cerebral atrophy is mild for age. Patchy to confluent hypodensities in the cerebral white matter bilaterally are similar to the prior CT and nonspecific but compatible with moderate chronic small vessel ischemic disease. Calcified atherosclerosis at the skull base. Unchanged punctate, likely vascular calcification in the right sylvian fissure. ORBITS: Right cataract extraction. SINUSES: No acute abnormality. SOFT TISSUES AND SKULL: No acute soft tissue abnormality. No skull fracture. IMPRESSION: 1. No acute intracranial abnormality. 2. Moderate chronic small vessel ischemic disease.  Electronically signed by: Dasie Hamburg MD 05/05/2024 04:08 PM EST RP Workstation: [id number]    PROCEDURES and INTERVENTIONS:  Procedures  Medications  lactated ringers  bolus 1,000 mL (0 mLs Intravenous Stopped 05/05/24 2103)  LORazepam  (ATIVAN ) tablet 0.5 mg (0.5 mg Oral Given 05/05/24 1703)  acetaminophen  (TYLENOL ) tablet 1,000 mg (1,000 mg Oral Given 05/05/24 1702)  LORazepam  (ATIVAN ) injection 0.5 mg (0.5 mg Intravenous Given 05/05/24 1755)     IMPRESSION / MDM / ASSESSMENT AND PLAN / ED COURSE  I reviewed the triage vital signs and the nursing notes.  Differential diagnosis includes, but is not limited to, TIA, stroke, dehydration, metabolic cephalopathy, viral syndrome, UTI  {Patient presents with symptoms of an acute illness or injury that is potentially life-threatening.  Patient presents with chronic intermittent weakness without evidence of acute pathology and suitable for outpatient management.  Normal exam for me.  Normal CBC, metabolic panel, UA without infectious features.  Clear CT head.  Do obtain MRI brain without signs of stroke or acute derangements.  Daughter acknowledges that patient is at her  baseline and they are comfortable with her going home.  No barriers to outpatient management at this point.  Discussed PCP follow-up and ED return precautions.  Clinical Course as of 05/05/24 2252  Mon May 05, 2024  1958 Reassessed, little bit sleepy from the Ativan  but she reports feeling well.  Discussed plan of care.  Fluids ongoing, pending UA, possible discharge after this, patient and daughter are in agreement [DS]    Clinical Course User Index [DS] Claudene Rover, MD     FINAL CLINICAL IMPRESSION(S) / ED DIAGNOSES   Final diagnoses:  Generalized weakness     Rx / DC Orders   ED Discharge Orders     None        Note:  This document was prepared using Dragon voice recognition software and may include unintentional dictation errors.   Claudene Rover,  MD 05/05/24 2252  "

## 2024-05-05 NOTE — ED Triage Notes (Signed)
 Pt to triage via wheelchair.  Pt was seen at [facility] today and sent to er for eval of weakness and slurred speech that began 3 days ago.  Pt denies headache.  Pt reports dizziness and slurred speech.  Pt alert.  Pt denies chest pain.

## 2024-10-13 ENCOUNTER — Ambulatory Visit: Admitting: Physician Assistant
